# Patient Record
Sex: Female | Born: 1945 | Race: White | Hispanic: No | Smoking: Former smoker
Health system: Southern US, Community
[De-identification: ages and names within clinical notes are randomized; demographics above are authoritative.]

## PROBLEM LIST (undated history)

## (undated) DIAGNOSIS — K922 Gastrointestinal hemorrhage, unspecified: Secondary | ICD-10-CM

## (undated) DIAGNOSIS — Z923 Personal history of irradiation: Secondary | ICD-10-CM

## (undated) DIAGNOSIS — C182 Malignant neoplasm of ascending colon: Secondary | ICD-10-CM

## (undated) DIAGNOSIS — I10 Essential (primary) hypertension: Secondary | ICD-10-CM

## (undated) DIAGNOSIS — K222 Esophageal obstruction: Secondary | ICD-10-CM

## (undated) DIAGNOSIS — M810 Age-related osteoporosis without current pathological fracture: Secondary | ICD-10-CM

## (undated) DIAGNOSIS — Z9889 Other specified postprocedural states: Secondary | ICD-10-CM

## (undated) DIAGNOSIS — J3081 Allergic rhinitis due to animal (cat) (dog) hair and dander: Secondary | ICD-10-CM

## (undated) DIAGNOSIS — R9439 Abnormal result of other cardiovascular function study: Secondary | ICD-10-CM

## (undated) DIAGNOSIS — I480 Paroxysmal atrial fibrillation: Secondary | ICD-10-CM

## (undated) DIAGNOSIS — J449 Chronic obstructive pulmonary disease, unspecified: Secondary | ICD-10-CM

## (undated) DIAGNOSIS — R0600 Dyspnea, unspecified: Secondary | ICD-10-CM

## (undated) DIAGNOSIS — I2699 Other pulmonary embolism without acute cor pulmonale: Secondary | ICD-10-CM

## (undated) DIAGNOSIS — M069 Rheumatoid arthritis, unspecified: Secondary | ICD-10-CM

## (undated) HISTORY — DX: Esophageal obstruction: K22.2

## (undated) HISTORY — DX: Essential (primary) hypertension: I10

## (undated) HISTORY — DX: Malignant neoplasm of ascending colon: C18.2

## (undated) HISTORY — PX: CYSTECTOMY: SUR359

## (undated) HISTORY — DX: Abnormal result of other cardiovascular function study: R94.39

## (undated) HISTORY — DX: Age-related osteoporosis without current pathological fracture: M81.0

## (undated) HISTORY — DX: Gastrointestinal hemorrhage, unspecified: K92.2

## (undated) HISTORY — DX: Rheumatoid arthritis, unspecified: M06.9

## (undated) HISTORY — DX: Other specified postprocedural states: Z98.890

## (undated) HISTORY — DX: Other pulmonary embolism without acute cor pulmonale: I26.99

## (undated) HISTORY — DX: Allergic rhinitis due to animal (cat) (dog) hair and dander: J30.81

## (undated) NOTE — *Deleted (*Deleted)
Synopsis: Referred in September 2020 for establish care with new pulmonologist former patient of Dr. Kendrick Fries, PCP: [name], MD  Subjective:   PATIENT ID: Desiree [name] GENDER: female DOB: [birth year], MRN: [id number]  No chief complaint on file.   18 year old female past medical history of COPD, pulmonary embolism, rheumatoid arthritis, 5 mg prednisone plus Plaquenil, former patient of Dr. Kendrick Fries.  COPD currently managed with Trelegy.  Has had several images following lung nodules since 2017.  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  PET scan July 2019 showed FDG activity SUV 1.348 mm left lower lobe nodule.  IgE 158, Rast panel positive for cat dander, dog dander and Aspergillus fumigate [place].  Last evaluation included sputum cultures, fungus AFB.  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  Appears patient had exacerbation in February and was treated with Levaquin.  As for her RA she is currently on Plaquenil.  Overall she has seen a significant decline in her physical ability.  She states that she is really noticed a significant increase in her dyspnea over the past several months.  Otherwise her respiratory complaints related to her COPD have been stable.  She uses her Trelegy inhaler regularly.  She occasionally describes a central "abnormal feeling in her chest with exertion but most of the time this all dissipates with resting.  She states even the time yes tasks make her severely fatigued as come paired to previous.  Patient denies fevers chills weight loss hemoptysis.  OV 11/08/2018: (Via telephone visit).  Patient scheduled for telephone visit to review CT imaging.  Patient found to have a 2 x 1.3 cm nodule abutting the minor fissure discussed via telephone today.  OV 11/23/2018: Here today to review CT imaging in person.  Patient had pulmonary function test  completed today prior to today's office visit.  PFTs revealed a ratio of 62, FEV1 51% predicted..  Also to review her CT imaging.  Patient very anxious about all of this that is going on.  She recently changed from Trelegy to Spiriva plus Symbicort.  She is doing well with her new inhaler regimen.  She feels less dyspneic.  Patient denies fevers chills night sweats weight loss, cough or sputum production.  OV 01/20/2019:  Virtual Visit via Telephone Note  I connected withBrenda Daisy [name] on 01/20/19 at  2:15 PM EST by telephoneand verified that I am speaking with the correct person using two identifiers.  Location: Patient: Desiree [name] Provider: Josephine Igo, DO   I discussed the limitations, risks, security and privacy concerns of performing an evaluation and management service by telephone and the availability of in person appointments. I also discussed with the patient that there may be a patient responsible charge related to this service. The patient expressed understanding and agreed to proceed.   History of Present Illness: Patient has complaints today of progressive dyspnea.  She currently has significant dyspnea with working around the house trying to get dressed or even go to the grocery store.  She states she has to stop 2 or 3 times lay down and rest because her dyspnea has increased.  She has been using her inhalers daily like she is supposed to.  She has not been using her albuterol.  She only uses that at nighttime before she goes to bed when she wheezes on occasion.  She does have a nebulizer at home but also  has not been using this.  She recently had a repeat CAT scan for follow-up of lung nodule.  This right lung nodule has resolved.  She does have persistent findings consistent with MAI including tree-in-bud opacities and chronic bronchitis type changes.  She feels like she may need oxygen.  She lost her SPO2 monitor at home and has not been able to monitor her oxygen  needs.  Observations/Objective: Able to speak in complete sentences.  Nonlabored breathing  CT scan of the chest: Right lung nodule resolution, tree-in-bud opacities and evidence of what would be considered chronic infection such as MAI. The patient's images have been independently reviewed by me.    Left heart catheterization: No severe obstruction.  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 Plan to walk in the office tomorrow to see if she qualifies for O2 need. Continue current inhaler regimen. Encourage patient to use nebulizer when she has acute episodes of shortness of breath at home or needs this prior to any significant exertional event. Patient was counseled on this. As for the possible MAI on CT I have ordered respiratory cultures of her sputum to include aerobic, anaerobic, fungus and AFB. Continue on triple therapy inhaler regimen.  Plus as needed albuterol Next step for management of COPD dyspnea symptoms would include items such as Daliresp or azithromycin Monday Wednesday Friday.  I would prefer to prove that she does not have MAI prior to starting azithromycin we may not be able to do this with sputum production alone. May consider Daliresp however we will need to counsel on risk of diarrhea.  Follow Up Instructions: Follow-up in clinic tomorrow for nurses walk to assess oxygen need  Telemetry visit with TP in 1 week  I discussed the assessment and treatment plan with the patient. The patient was provided an opportunity to ask questions and all were answered. The patient agreed with the plan and demonstrated an understanding of the instructions.  The patient was advised to call back or seek an in-person evaluation if the symptoms worsen or if the condition fails  to improve as anticipated.  I provided 22 minutes of non-face-to-face time during this encounter.   Josephine Igo, DO      Past Medical History:  Diagnosis Date  . Cat allergies   . COPD (chronic obstructive pulmonary disease) (HCC)   . Hypertension   . Osteoporosis   . Pulmonary embolism (HCC) 2017  . Rheumatoid arthritis(714.0)    Dr Azzie Roup follows     Family History  Problem Relation Age of Onset  . Heart disease Father   . Stroke Mother   . Clotting disorder Mother   . Heart disease Sister   . Sleep apnea Sister   . Emphysema Sister   . Breast cancer Maternal Aunt   . Emphysema Paternal Grandmother      Past Surgical History:  Procedure Laterality Date  . CYSTECTOMY Right    breast  . LEFT HEART CATH AND CORONARY ANGIOGRAPHY N/A 01/05/2019   Procedure: LEFT HEART CATH AND CORONARY ANGIOGRAPHY;  Surgeon: Corky Crafts, MD;  Location: [facility] INVASIVE CV LAB;  Service: Cardiovascular;  Laterality: N/A;    Social History   Socioeconomic History  . Marital status: Widowed    Spouse name: Not on file  . Number of children: Not  on file  . Years of education: Not on file  . Highest education level: Not on file  Occupational History  . Occupation: retired  Tobacco Use  . Smoking status: Former Smoker    Packs/day: 1.00    Years: 40.00    Pack years: 40.00    Types: Cigarettes    Quit date: 02/03/2001    Years since quitting: 18.8  . Smokeless tobacco: Never Used  Vaping Use  . Vaping Use: Never used  Substance and Sexual Activity  . Alcohol use: Yes    Alcohol/week: 2.0 standard drinks    Types: 2 Standard drinks or equivalent per week    Comment: 2 drinks daily  . Drug use: No  . Sexual activity: Not on file  Other Topics Concern  . Not on file  Social History Narrative   Tobacco use, amount per day now: NONE   Past tobacco use, amount per day: 1 PACK PER DAY   How many years did you use tobacco:TOO MANY STOPPED IN 2003   Alcohol use  (drinks per week): 14   Diet: GOOD   Do you drink/eat things with caffeine: YES   Marital status: WIDOW                What year were you married? 1967   Do you live in a house, apartment, assisted living, condo, trailer, etc.? HOUSE   Is it one or more stories? SPLIT LEVEL   How many persons live in your home? 1   Do you have pets in your home?( please list) NONE   Current or past profession: PAST RECORDS TECHNICIAN AT [employer]   Do you exercise?  NO                                Type and how often?   Do you have a living will? YES    Do you have a DNR form?  YES                                 If not, do you want to discuss one?   Do you have signed POA/HPOA forms?        ??              If so, please bring to you appointment      [facility] (05/16/16):   Originally from [place], [place]. 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 No pets currently. No bird, mold, or hot tub exposure.    Social Determinants of Health   Financial Resource Strain:   . Difficulty of Paying Living Expenses: Not on file  Food Insecurity:   . Worried About Programme researcher, broadcasting/film/video in the Last Year: Not on file  . Ran Out of Food in the Last Year: Not on file  Transportation Needs:   . Lack of Transportation (Medical): Not on file  . Lack of Transportation (Non-Medical): Not on file  Physical Activity:   . Days of Exercise per Week: Not on file  . Minutes of Exercise per Session: Not on file  Stress:   . Feeling of Stress : Not on file  Social Connections:   . Frequency of Communication with Friends and Family: Not on file  . Frequency of Social Gatherings with Friends and Family: Not on file  .  Attends Religious Services: Not on file  . Active Member of Clubs or Organizations: Not on file  . Attends Banker Meetings: Not on file  . Marital Status: Not on file  Intimate Partner Violence:   . Fear of Current or Ex-Partner: Not on file  .  Emotionally Abused: Not on file  . Physically Abused: Not on file  . Sexually Abused: Not on file     Allergies  Allergen Reactions  . Other Shortness Of Breath    Cats     Outpatient Medications Prior to Visit  Medication Sig Dispense Refill  . albuterol (PROAIR HFA) 108 (90 Base) MCG/ACT inhaler Inhale 2 puffs into the lungs every 4 (four) hours as needed for wheezing or shortness of breath.    Marland Kitchen amLODipine (NORVASC) 5 MG tablet Take 1 tablet (5 mg total) by mouth daily. 90 tablet 1  . apixaban (ELIQUIS) 2.5 MG TABS tablet Take 1 tablet (2.5 mg total) by mouth 2 (two) times daily. 60 tablet 2  . atorvastatin (LIPITOR) 20 MG tablet Take 1 tablet (20 mg total) by mouth daily. 90 tablet 3  . budesonide-formoterol (SYMBICORT) 160-4.5 MCG/ACT inhaler Inhale 2 puffs into the lungs every 12 (twelve) hours. 10.2 g 11  . carvedilol (COREG) 3.125 MG tablet Take 1 tablet (3.125 mg total) by mouth 2 (two) times daily with a meal. 180 tablet 2  . cholecalciferol (VITAMIN D) 25 MCG (1000 UT) tablet Take 1,000 Units by mouth daily.     . ferrous sulfate 325 (65 FE) MG EC tablet Take 325 mg by mouth in the morning and at bedtime.    . fexofenadine (ALLEGRA) 180 MG tablet Take 180 mg by mouth daily.    . hydroxychloroquine (PLAQUENIL) 200 MG tablet Take 200 mg by mouth 2 (two) times daily.   3  . leflunomide (ARAVA) 20 MG tablet Take 20 mg by mouth daily.    . montelukast (SINGULAIR) 10 MG tablet Take 1 tablet (10 mg total) by mouth at bedtime. 90 tablet 1  . omeprazole (PRILOSEC) 20 MG capsule Take 1 capsule (20 mg total) by mouth daily. 90 capsule 1  . predniSONE (DELTASONE) 5 MG tablet Take 5 mg by mouth daily with breakfast.    . sertraline (ZOLOFT) 100 MG tablet Take 100 mg by mouth daily.     . Tiotropium Bromide Monohydrate (SPIRIVA RESPIMAT) 2.5 MCG/ACT AERS Inhale 2 puffs into the lungs daily. 4 g 11  . valsartan (DIOVAN) 320 MG tablet Take 320 mg by mouth daily.    . vitamin B-12  (CYANOCOBALAMIN) 1000 MCG tablet Take 1,000 mcg by mouth daily.      No facility-administered medications prior to visit.    ROS   Objective:  Physical Exam   There were no vitals filed for this visit.   on RA BMI Readings from Last 3 Encounters:  05/25/19 25.51 kg/m  02/02/19 27.02 kg/m  01/05/19 27.21 kg/m   Wt Readings from Last 3 Encounters:  05/25/19 135 lb (61.2 kg)  02/02/19 143 lb (64.9 kg)  01/05/19 144 lb (65.3 kg)     CBC    Component Value Date/Time   WBC 8.5 09/30/2019 1151   WBC 3.4 (L) 11/24/2014 0029   RBC 3.36 (L) 09/30/2019 1151   RBC 3.09 (L) 11/24/2014 0029   HGB 11.1 09/30/2019 1151   HCT 33.4 (L) 09/30/2019 1151   PLT 333 09/30/2019 1151   MCV 99 (H) 09/30/2019 1151   MCH 33.0 09/30/2019  1151   MCH 32.7 11/24/2014 0029   MCHC 33.2 09/30/2019 1151   MCHC 33.9 11/24/2014 0029   RDW 12.3 09/30/2019 1151   LYMPHSABS 0.6 (L) 10/03/2015 1512   MONOABS 1.2 (H) 11/24/2014 0029   EOSABS 0.2 10/03/2015 1512   BASOSABS 0.0 10/03/2015 1512    Chest Imaging: CT chest 03/03/2017: Evidence of infectious bronchiolitis, 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  Moderate emphysema. The patient's images have been independently reviewed by me.    CT chest September 2020: Reviewed with patient today in the office.  Does have 2 cm right upper lobe nodule with area of inflammatory appearing groundglass around this. Also has evidence of MAI and cylindric bronchiectasis. The patient's images have been independently reviewed by me.    Pulmonary Functions Testing Results: PFT Results Latest Ref Rng & Units 11/23/2018 09/05/2016  FVC-Pre L 1.46 1.71  FVC-Predicted Pre % 59 67  FVC-Post L 1.52 1.75  FVC-Predicted Post % 61 69  Pre FEV1/FVC % % 63 66  Post FEV1/FCV % % 62 69  FEV1-Pre L 0.91 1.13  FEV1-Predicted Pre % 49 59  FEV1-Post L 0.94 1.21  DLCO uncorrected ml/min/mmHg 9.72 13.43  DLCO UNC% %  56 66  DLCO corrected ml/min/mmHg - 14.03  DLCO COR %Predicted % - 69  DLVA Predicted % 72 86  TLC L 4.17 4.77  TLC % Predicted % 90 103  RV % Predicted % 113 128    FeNO: None   Pathology: None  Echocardiogram: None   Heart Catheterization:  01/05/2019:  Mid LAD lesion is 10% stenosed.  The left ventricular systolic function is normal.  LV end diastolic pressure is normal.  The left ventricular ejection fraction is 55-65% by visual estimate.  There is no aortic valve stenosis.    Assessment & Plan:   No diagnosis found.   Josephine Igo, DO [facility] 12/09/2019 2:50 PM

---

## 2003-08-03 ENCOUNTER — Other Ambulatory Visit: Admission: RE | Admit: 2003-08-03 | Discharge: 2003-08-03 | Payer: Self-pay | Admitting: Obstetrics and Gynecology

## 2005-04-28 ENCOUNTER — Ambulatory Visit: Payer: Self-pay | Admitting: Internal Medicine

## 2005-06-26 ENCOUNTER — Ambulatory Visit: Payer: Self-pay | Admitting: Internal Medicine

## 2010-02-24 ENCOUNTER — Encounter: Payer: Self-pay | Admitting: Obstetrics and Gynecology

## 2010-08-21 ENCOUNTER — Encounter: Payer: Self-pay | Admitting: Internal Medicine

## 2010-08-22 ENCOUNTER — Encounter: Payer: Self-pay | Admitting: Internal Medicine

## 2010-08-22 ENCOUNTER — Ambulatory Visit: Payer: Self-pay | Admitting: Internal Medicine

## 2010-08-22 ENCOUNTER — Ambulatory Visit (INDEPENDENT_AMBULATORY_CARE_PROVIDER_SITE_OTHER)
Admission: RE | Admit: 2010-08-22 | Discharge: 2010-08-22 | Disposition: A | Discharge status: Home / Self Care | Payer: Medicare Other | Source: Ambulatory Visit | Attending: Internal Medicine | Admitting: Internal Medicine

## 2010-08-22 ENCOUNTER — Telehealth: Payer: Self-pay | Admitting: Internal Medicine

## 2010-08-22 ENCOUNTER — Ambulatory Visit (INDEPENDENT_AMBULATORY_CARE_PROVIDER_SITE_OTHER): Payer: Medicare Other | Admitting: Internal Medicine

## 2010-08-22 VITALS — BP 120/72 | HR 87 | Temp 97.5°F | Ht 63.0 in | Wt 129.2 lb

## 2010-08-22 DIAGNOSIS — I10 Essential (primary) hypertension: Secondary | ICD-10-CM

## 2010-08-22 DIAGNOSIS — J449 Chronic obstructive pulmonary disease, unspecified: Secondary | ICD-10-CM

## 2010-08-22 HISTORY — DX: Essential (primary) hypertension: I10

## 2010-08-22 NOTE — Assessment & Plan Note (Signed)

## 2010-08-22 NOTE — Progress Notes (Signed)
Subjective:     Patient ID: Desiree [name], female   DOB: [birth year], 65 y.o.   MRN: [id number]  HPI  58 yowf stopped smoking  in 2004 with PFT's 06/2005 with FEV 70% and problems with intermittent flares of bronchitis since and dx with RA by Dr Dareen Piano 2011 and "bronchitis keeps getting worse" so self referred back to pulmonary clinic 08/2010    08/22/2010 Initial pulmonary office eval in EMR era on ACE  with breathing better since last required prednisone and fm concerned about repeated use.  No purulent sputum at ov nor limiting sob or need for saba.  Sleeping ok without nocturnal  or early am exac of resp c/o's  .    Pt denies any significant sore throat, dysphagia, itching, sneezing,  nasal congestion or excess/ purulent secretions,  fever, chills, sweats, unintended wt loss, pleuritic or exertional cp, hempoptysis, orthopnea pnd or leg swelling.    Also denies any obvious fluctuation of symptoms with weather or environmental changes or other aggravating or alleviating factors.  Seems to get bronchitis every time she travels out of state     Review of Systems  Constitutional: Negative for fever, chills and unexpected weight change.  HENT: Negative for ear pain, nosebleeds, congestion, sore throat, rhinorrhea, sneezing, trouble swallowing, dental problem, voice change, postnasal drip and sinus pressure.   Eyes: Negative for visual disturbance.  Respiratory: Negative for cough, choking and shortness of breath.   Cardiovascular: Negative for chest pain and leg swelling.  Gastrointestinal: Negative for vomiting, abdominal pain and diarrhea.  Genitourinary: Negative for difficulty urinating.  Musculoskeletal: Negative for arthralgias.  Skin: Negative for rash.  Neurological: Negative for tremors, syncope and headaches.  Hematological: Does not bruise/bleed easily.       Objective:   Physical Exam    pleasant amb wf nad. Wt 129 08/22/2010  HEENT mild turbinate edema.  Oropharynx no  thrush or excess pnd or cobblestoning.  No JVD or cervical adenopathy. Mild accessory muscle hypertrophy. Trachea midline, nl thryroid. Chest was hyperinflated by percussion with diminished breath sounds and moderate increased exp time without wheeze. Hoover sign positive at mid inspiration. Regular rate and rhythm without murmur gallop or rub or increase P2 or edema.  Abd: no hsm, nl excursion. Ext warm without cyanosis or clubbing.    cxr 08/22/2010 No acute cardiopulmonary disease.   Assessment:         Plan:

## 2010-08-22 NOTE — Patient Instructions (Signed)
Bring all active medications with you  Stop lotrel 5/20 and take Azor 5/20 one daily in its place  Please schedule a follow up office visit in 4 weeks, sooner if needed for PFT's

## 2010-08-22 NOTE — Telephone Encounter (Signed)
Pt aware this has been taken care of

## 2010-08-22 NOTE — Assessment & Plan Note (Addendum)
Symptoms are markedly disproportionate to objective findings and not clear this is a lung problem but pt does appear to have difficult airway management issues.  DDX of  difficult airways managment all start with A and  include Adherence, Ace Inhibitors, Acid Reflux, Active Sinus Disease, Alpha 1 Antitripsin deficiency, Anxiety masquerading as Airways dz,  ABPA,  allergy(esp in young), Aspiration (esp in elderly), Adverse effects of DPI,  Active smokers, plus two Bs  = Bronchiectasis and Beta blocker use..and one C= CHF  ACE inhibitors the leading suspect here and needs trial off then return for pfts  Adherence also a concern, confused with meds  See instructions for specific recommendations which were reviewed directly with the patient who was given a copy with highlighter outlining the key components.

## 2010-08-22 NOTE — Telephone Encounter (Signed)
[email or web address]  

## 2010-08-23 ENCOUNTER — Telehealth: Payer: Self-pay | Admitting: Internal Medicine

## 2010-08-23 NOTE — Telephone Encounter (Signed)
Pt returning call can be reached at [phone].Desiree [name]

## 2010-08-23 NOTE — Telephone Encounter (Signed)
Spoke with the pt and she states she called her pharmacy and the Azor will be $62 per month and she wanted to know if there was a cheaper alternative. She was given samples at appt yesterday of the Azor with instruction to f/u in 4 weeks. Pt states she has enough samples to last until OV so I advised her to use the samples and bring a copy of her formulary with her to her appt so MW can decide on alternative. Carron Curie, CMA

## 2010-08-23 NOTE — Telephone Encounter (Signed)
[email or web address].Rennie Rouch, CMA  

## 2010-09-19 ENCOUNTER — Ambulatory Visit (INDEPENDENT_AMBULATORY_CARE_PROVIDER_SITE_OTHER): Payer: Medicare Other | Admitting: Internal Medicine

## 2010-09-19 ENCOUNTER — Encounter: Payer: Self-pay | Admitting: Internal Medicine

## 2010-09-19 VITALS — BP 118/70 | HR 90 | Temp 97.8°F | Ht 63.0 in | Wt 128.0 lb

## 2010-09-19 DIAGNOSIS — J449 Chronic obstructive pulmonary disease, unspecified: Secondary | ICD-10-CM

## 2010-09-19 DIAGNOSIS — M069 Rheumatoid arthritis, unspecified: Secondary | ICD-10-CM

## 2010-09-19 DIAGNOSIS — I10 Essential (primary) hypertension: Secondary | ICD-10-CM

## 2010-09-19 LAB — PULMONARY FUNCTION TEST

## 2010-09-19 NOTE — Assessment & Plan Note (Addendum)
Mild copd GOLD II.    As I explained to this patient in detail:  although there may be significant copd present, it does not appear to be limiting activity tolerance any more than a set of worn tires limits someone from driving a car  around a parking lot.  A new set of Michelins might look good but would have no perceived impact on the performance of the car and would not be worth the cost.  That is to say:   this pt is so sedentary I don't recommend aggressive pulmonary rx at this point unless limiting symptoms arise or acute exacerbations become as issue, neither of which are the case now.  I asked the patient to contact this office at any time in the future should either of these problems arise.

## 2010-09-19 NOTE — Progress Notes (Signed)
Subjective:     Patient ID: Desiree [name], female   DOB: Jan 31, 1946, 65 y.o.   MRN: [id number]  HPI  22 yowf stopped smoking  in 2004 with PFT's 06/2005 with FEV 70% and problems with intermittent flares of bronchitis since and dx with RA by Dr Dareen Piano 2011 and "bronchitis keeps getting worse" so self referred back to pulmonary clinic 08/2010    08/22/2010 Initial pulmonary office eval in EMR era on ACEI  with breathing better since last required prednisone and fm concerned about repeated use.  No purulent sputum at ov nor limiting sob or need for saba.   09/19/2010 f/u ov/Desiree [name] cc doe x yeardwork when humid x 20 min s stopping,  Does mall walking fine,  Does treadmill x 30 min variable inclines up to 3 mph.  No episodes of "bronchitis" since stopped acei but hasn't traveled since then, her usual trigger.  Sleeping ok without nocturnal  or early am exac of resp c/o's.   Pt denies any significant sore throat, dysphagia, itching, sneezing,  nasal congestion or excess/ purulent secretions,  fever, chills, sweats, unintended wt loss, pleuritic or exertional cp, hempoptysis, orthopnea pnd or leg swelling.    Also denies any obvious fluctuation of symptoms with weather or environmental changes or other aggravating or alleviating factors.              Objective:   Physical Exam    pleasant amb wf nad. Wt 129 08/22/2010 >  128 09/19/2010  HEENT mild turbinate edema.  Oropharynx no thrush or excess pnd or cobblestoning.  No JVD or cervical adenopathy. Mild accessory muscle hypertrophy. Trachea midline, nl thryroid. Chest was hyperinflated by percussion with diminished breath sounds and moderate increased exp time without wheeze. Hoover sign positive at mid inspiration. Regular rate and rhythm without murmur gallop or rub or increase P2 or edema.  Abd: no hsm, nl excursion. Ext warm without cyanosis or clubbing.    cxr 08/22/2010 No acute cardiopulmonary disease.   Assessment:         Plan:

## 2010-09-19 NOTE — Patient Instructions (Signed)
Continue azor 5/20 one daily and bring the sheet with blood pressure medication list with you to next visit so we can pick you a good generic.  You have only have mild copd from smoking.   Please schedule a follow up office visit in 6 weeks, call sooner if needed

## 2010-09-19 NOTE — Progress Notes (Signed)
PFT done today. 

## 2010-09-20 DIAGNOSIS — M069 Rheumatoid arthritis, unspecified: Secondary | ICD-10-CM | POA: Insufficient documentation

## 2010-09-20 HISTORY — DX: Rheumatoid arthritis, unspecified: M06.9

## 2010-09-20 NOTE — Assessment & Plan Note (Signed)
Better off acei so far but hasn't traveled.  Prefer to leave them off for at least a year to establish a baseline frequency and severity of "bronchtitis" then if felt to be advantageous over ARB in term of cost/benefit/risk ok with me to rechallenge

## 2010-09-20 NOTE — Assessment & Plan Note (Signed)
Reviewed pft's and cxr: no evidence of ra lung though she is at risk of this plus adverse effects of ra drugs like mtx

## 2010-11-04 ENCOUNTER — Encounter: Payer: Self-pay | Admitting: Internal Medicine

## 2010-11-04 ENCOUNTER — Ambulatory Visit (INDEPENDENT_AMBULATORY_CARE_PROVIDER_SITE_OTHER): Payer: Medicare Other | Admitting: Internal Medicine

## 2010-11-04 DIAGNOSIS — J4489 Other specified chronic obstructive pulmonary disease: Secondary | ICD-10-CM

## 2010-11-04 DIAGNOSIS — I10 Essential (primary) hypertension: Secondary | ICD-10-CM

## 2010-11-04 DIAGNOSIS — J449 Chronic obstructive pulmonary disease, unspecified: Secondary | ICD-10-CM

## 2010-11-04 MED ORDER — AMLODIPINE BESYLATE 5 MG PO TABS: 5.0000 mg | ORAL_TABLET | Freq: Every day | ORAL | Status: DC

## 2010-11-04 MED ORDER — IRBESARTAN 300 MG PO TABS: ORAL_TABLET | ORAL | Status: DC

## 2010-11-04 NOTE — Progress Notes (Signed)
Subjective:     Patient ID: Desiree [name], female   DOB: Mar 10, 1945, 65 y.o.   MRN: [id number]  HPI  24 yowf stopped smoking  in 2004 with PFT's 06/2005 with FEV 70% and problems with intermittent flares of bronchitis since and dx with RA by Dr Dareen Piano 2011 and "bronchitis keeps getting worse" so self referred back to pulmonary clinic 08/2010    08/22/2010 Initial pulmonary office eval in EMR era on ACEI  with breathing better since last required prednisone and fm concerned about repeated use.  No purulent sputum at ov nor limiting sob or need for saba. rec stop ace then regroup   09/19/2010 f/u ov/Desiree [name] cc doe x yardwork when humid x 20 min s stopping,  Does mall walking fine,  Does treadmill x 30 min variable inclines up to 3 mph.  No episodes of "bronchitis" since stopped acei but hasn't traveled since then, her usual trigger. rec Continue azor 5/20 one daily and bring the sheet with blood pressure medication list with you to next visit so we can pick you a good generic.  You have only have mild copd from smoking.     11/04/2010 f/u ov/Desiree [name] cc meds too expensive, no change in ex tol, no cough or aecopd but drainage each am takes about an hour to hock up the mucus happens year round < 1/4 of a cup of clear mucus and concerned re upcoming trips to [place] and [place] (in [place] anticipated).  No change in am cough on or off prednisone for RA  Sleeping ok without nocturnal  or early am exac of resp c/o's.   Pt denies any significant sore throat, dysphagia, itching, sneezing,  nasal congestion or excess/ purulent secretions,  fever, chills, sweats, unintended wt loss, pleuritic or exertional cp, hempoptysis, orthopnea pnd or leg swelling.    Also denies any obvious fluctuation of symptoms with weather or environmental changes or other aggravating or alleviating factors.              Objective:   Physical Exam    pleasant amb wf nad. Wt 129 08/22/2010 >  128 09/19/2010 > 128 11/04/2010    HEENT mild turbinate edema.  Oropharynx no thrush or excess pnd or cobblestoning.  No JVD or cervical adenopathy. Mild accessory muscle hypertrophy. Trachea midline, nl thryroid. Chest was hyperinflated by percussion with diminished breath sounds and moderate increased exp time without wheeze. Hoover sign positive at mid inspiration. Regular rate and rhythm without murmur gallop or rub or increase P2 or edema.  Abd: no hsm, nl excursion. Ext warm without cyanosis or clubbing.    cxr 08/22/2010 No acute cardiopulmonary disease.   Assessment:         Plan:

## 2010-11-04 NOTE — Patient Instructions (Addendum)
Stop azor (which is a combination pill) and take it's components, amlodipine 5 mg and Irbesartan 300mg  each am   Stop fish oil  Start Pepcid 20 mg  Ac one at bedtime  Start singulair a week before allergy exposure and then just use clariton if  Nasal allergy symptoms. If breathing symptoms use albuterol and avoid cat exposure  GERD (REFLUX)  is an extremely common cause of respiratory symptoms, many times with no significant heartburn at all.    It can be treated with medication, but also with lifestyle changes including avoidance of late meals, excessive alcohol, smoking cessation, and avoid fatty foods, chocolate, peppermint, colas, red wine, and acidic juices such as orange juice.  NO MINT OR MENTHOL PRODUCTS SO NO COUGH DROPS  USE SUGARLESS CANDY INSTEAD (jolley ranchers or Stover's)  NO OIL BASED VITAMINS - use powdered substitutes.   Please schedule a follow up visit in 3 months but call sooner if needed

## 2010-11-05 ENCOUNTER — Encounter: Payer: Self-pay | Admitting: Internal Medicine

## 2010-11-05 NOTE — Assessment & Plan Note (Signed)
Adequate control on present rx, reviewed   Main issue is what to do if flares, which is much less likely off acei and with continued gerd rx I had an extended discussion with the patient today lasting 15 to 20 minutes of a 25 minute visit on the following issues: See instructions for specific recommendations which were reviewed directly with the patient who was given a copy with highlighter outlining the key components.

## 2010-11-05 NOTE — Assessment & Plan Note (Signed)
Adequate control on present rx, reviewed off ACEI successfully and would definitely avoid in future due to difficulty in sorting out symptoms of "aecopd" from ace effect  See instructions for specific recommendations which were reviewed directly with the patient who was given a copy with highlighter outlining the key components. (changed to generics per her formulary)

## 2010-12-20 ENCOUNTER — Telehealth: Payer: Self-pay | Admitting: Internal Medicine

## 2010-12-20 NOTE — Telephone Encounter (Signed)
I spoke with pt and she states she wanted to come in and see MW next mon, tues bc she is flying to [place] on wed. I advised pt MW did not have any openings but could see TP. Pt is scheduled to see TP Monday at 3:15

## 2010-12-23 ENCOUNTER — Encounter: Payer: Self-pay | Admitting: Adult Health

## 2010-12-23 ENCOUNTER — Ambulatory Visit (INDEPENDENT_AMBULATORY_CARE_PROVIDER_SITE_OTHER): Payer: Medicare Other | Admitting: Adult Health

## 2010-12-23 DIAGNOSIS — J449 Chronic obstructive pulmonary disease, unspecified: Secondary | ICD-10-CM

## 2010-12-23 MED ORDER — PHENYLEPH-PROMETHAZINE-COD 5-6.25-10 MG/5ML PO SYRP: 5.0000 mL | ORAL_SOLUTION | Freq: Four times a day (QID) | ORAL | Status: DC | PRN

## 2010-12-23 MED ORDER — AZITHROMYCIN 250 MG PO TABS: ORAL_TABLET | ORAL | Status: AC

## 2010-12-23 NOTE — Patient Instructions (Addendum)
Zpack take as directed.  Mucinex DM Twice daily  As needed  Cough /congestion  Phenergan with codeine As needed  For cough -may make you sleepy  Please contact office for sooner follow up if symptoms do not improve or worsen or seek emergency care  Fluids and rest .  follow up Dr. Sherene Sires  As planned and As needed

## 2010-12-23 NOTE — Assessment & Plan Note (Signed)
Mild flare with bronchitis   Plan ' Zpack take as directed.  Mucinex DM Twice daily  As needed  Cough /congestion  Phenergan with codeine As needed  For cough -may make you sleepy  Please contact office for sooner follow up if symptoms do not improve or worsen or seek emergency care  Fluids and rest .  follow up Dr. Sherene Sires  As planned and As needed

## 2010-12-23 NOTE — Progress Notes (Signed)
Subjective:     Patient ID: Desiree [name], female   DOB: 1945/11/26, 65 y.o.   MRN: [id number]  HPI 13 yowf stopped smoking  in 2004 with PFT's 06/2005 with FEV 70% and problems with intermittent flares of bronchitis since and dx with RA by Dr Dareen Piano 2011 and "bronchitis keeps getting worse" so self referred back to pulmonary clinic 08/2010    08/22/2010 Initial pulmonary office eval in EMR era on ACEI  with breathing better since last required prednisone and fm concerned about repeated use.  No purulent sputum at ov nor limiting sob or need for saba. rec stop ace then regroup   09/19/2010 f/u ov/Wert cc doe x yardwork when humid x 20 min s stopping,  Does mall walking fine,  Does treadmill x 30 min variable inclines up to 3 mph.  No episodes of "bronchitis" since stopped acei but hasn't traveled since then, her usual trigger. rec Continue azor 5/20 one daily and bring the sheet with blood pressure medication list with you to next visit so we can pick you a good generic.  You have only have mild copd from smoking.     11/04/2010 f/u ov/Wert cc meds too expensive, no change in ex tol, no cough or aecopd but drainage each am takes about an hour to hock up the mucus happens year round < 1/4 of a cup of clear mucus and concerned re upcoming trips to [place] and [place] (in [place] anticipated).  No change in am cough on or off prednisone for RA >>add pepcid and singulair   12/23/2010 Acute OV  Complains of Patient states she may have bronchitis. Patient c/o cough with cloudy mucus, fatigue, sob, and unable to sleep due to cough. Onset x1 week ago. She is going out of town for the holidays and does not want to be sick. Wants a zpack to get over symptoms.  Denies any chest pain or dyspnea /edema   ROS:  Constitutional:   No  weight loss, night sweats,  Fevers, chills, ++ fatigue, or  lassitude.  HEENT:   No headaches,  Difficulty swallowing,  Tooth/dental problems, or  Sore throat,     No sneezing, itching, ear ache,  ++nasal congestion, post nasal drip,   CV:  No chest pain,  Orthopnea, PND, swelling in lower extremities, anasarca, dizziness, palpitations, syncope.   GI  No heartburn, indigestion, abdominal pain, nausea, vomiting, diarrhea, change in bowel habits, loss of appetite, bloody stools.   Resp:    No coughing up of blood.    No chest wall deformity  Skin: no rash or lesions.  GU: no dysuria, change in color of urine, no urgency or frequency.  No flank pain, no hematuria   MS:  No joint pain or swelling.  No decreased range of motion.  No back pain.  Psych:  No change in mood or affect. No depression or anxiety.  No memory loss.              Objective:   Physical Exam    pleasant amb wf nad. Wt 129 08/22/2010 >  128 09/19/2010 > 128 11/04/2010 >>130 12/23/2010  HEENT mild turbinate edema.  Oropharynx no thrush or excess pnd or cobblestoning.  No JVD or cervical adenopathy. Mild accessory muscle hypertrophy. Trachea midline, nl thryroid.Chest : coarse BS  Regular rate and rhythm without murmur gallop or rub or increase P2 or edema.  Abd: no hsm, nl excursion. Ext warm without cyanosis or clubbing.  Assessment:         Plan:

## 2011-02-17 ENCOUNTER — Encounter: Payer: Self-pay | Admitting: Emergency Medicine

## 2011-02-17 ENCOUNTER — Ambulatory Visit (INDEPENDENT_AMBULATORY_CARE_PROVIDER_SITE_OTHER): Payer: Medicare Other | Admitting: Emergency Medicine

## 2011-02-17 ENCOUNTER — Telehealth: Payer: Self-pay | Admitting: Internal Medicine

## 2011-02-17 ENCOUNTER — Ambulatory Visit (INDEPENDENT_AMBULATORY_CARE_PROVIDER_SITE_OTHER)
Admission: RE | Admit: 2011-02-17 | Discharge: 2011-02-17 | Disposition: A | Discharge status: Home / Self Care | Payer: Medicare Other | Source: Ambulatory Visit | Attending: Emergency Medicine | Admitting: Emergency Medicine

## 2011-02-17 VITALS — BP 102/78 | HR 92 | Temp 98.2°F | Ht 62.0 in | Wt 126.8 lb

## 2011-02-17 DIAGNOSIS — J449 Chronic obstructive pulmonary disease, unspecified: Secondary | ICD-10-CM

## 2011-02-17 DIAGNOSIS — R05 Cough: Secondary | ICD-10-CM

## 2011-02-17 DIAGNOSIS — R059 Cough, unspecified: Secondary | ICD-10-CM

## 2011-02-17 NOTE — Telephone Encounter (Signed)
[id number] x1 

## 2011-02-17 NOTE — Progress Notes (Signed)
  Subjective:    Patient ID: Desiree [name], female    DOB: Oct 08, 1945, 66 y.o.   MRN: [id number]  HPI 89 former smoker, mild COPD followed by Dr Sherene Sires (FEV1 75% pred in 2012). Tells me that she developed URI about 10d ago, evolved into progressive dyspnea. No real cough, but a lot of head congestion and chest congestion. Started on prednisone + omnicef/azithro 5 days ago. She is using albuterol every 6 hours right now, not sure if it is helping. CXR today is clear.    Review of Systems  Constitutional: Negative.  Negative for fever and unexpected weight change.  HENT: Positive for congestion. Negative for ear pain, nosebleeds, sore throat, rhinorrhea, sneezing, trouble swallowing, dental problem, postnasal drip and sinus pressure.   Eyes: Negative.  Negative for redness and itching.  Respiratory: Positive for cough, shortness of breath and wheezing. Negative for chest tightness.   Cardiovascular: Negative.  Negative for palpitations and leg swelling.  Gastrointestinal: Negative.  Negative for nausea and vomiting.  Genitourinary: Negative.  Negative for dysuria.  Musculoskeletal: Negative.  Negative for joint swelling.  Skin: Negative.  Negative for rash.  Neurological: Negative.  Negative for headaches.  Hematological: Negative.  Does not bruise/bleed easily.  Psychiatric/Behavioral: Negative.  Negative for dysphoric mood. The patient is not nervous/anxious.        Objective:   Physical Exam  Gen: Pleasant, well-nourished, in no distress,  normal affect  ENT: No lesions,  mouth clear,  oropharynx clear, no postnasal drip  Neck: No JVD, no TMG, no carotid bruits  Lungs: No use of accessory muscles, clear on normal breath, some exp wheeze on forced exp.  Cardiovascular: RRR, heart sounds normal, no murmur or gallops, no peripheral edema  Musculoskeletal: No deformities, no cyanosis or clubbing  Neuro: alert, non focal  Skin: Warm, no lesions or rashes   CXR 02/17/11: clear, no  infiltrates.     Assessment & Plan:  COPD (chronic obstructive pulmonary disease) With apparent AE following a URI. No CAP on CXR today.  - finish pred and abx as ordered by her PCP - albuterol prn - OV with MW or TP to insure resolution in 3 weeks

## 2011-02-17 NOTE — Telephone Encounter (Signed)
Patient returning call.

## 2011-02-17 NOTE — Telephone Encounter (Signed)
I spoke with pt and she states her pcp advised her that she had PNA (but did not do cxr) and needed to f/u w/ pulm ASAP w/ cxr for this. Pt is scheduled to come in and see RB today at 3:15. Per lori have pt have cxr done 1st before apt. Pt aware to arrive 15 min early for cxr. Nothing further was needed

## 2011-02-17 NOTE — Assessment & Plan Note (Signed)
With apparent AE following a URI. No CAP on CXR today.  - finish pred and abx as ordered by her PCP - albuterol prn - OV with MW or TP to insure resolution in 3 weeks

## 2011-02-17 NOTE — Patient Instructions (Signed)
Please finish the prednisone and antibiotics that you are taking as directed Use Ventolin or albuterol nebulizer as needed for shortness of breath. You could use up to every 4 hours if needed.  Follow with Dr Sherene Sires or T. Parrett in 3 weeks to insure that you are improved.  Call our office if you are worsening in any way.

## 2011-03-14 ENCOUNTER — Ambulatory Visit (INDEPENDENT_AMBULATORY_CARE_PROVIDER_SITE_OTHER): Payer: Medicare Other | Admitting: Internal Medicine

## 2011-03-14 ENCOUNTER — Encounter: Payer: Self-pay | Admitting: Internal Medicine

## 2011-03-14 VITALS — BP 122/80 | HR 107 | Temp 98.5°F | Ht 62.0 in | Wt 126.0 lb

## 2011-03-14 DIAGNOSIS — I10 Essential (primary) hypertension: Secondary | ICD-10-CM

## 2011-03-14 DIAGNOSIS — J449 Chronic obstructive pulmonary disease, unspecified: Secondary | ICD-10-CM

## 2011-03-14 NOTE — Progress Notes (Signed)
Subjective:     Patient ID: Desiree [name], female   DOB: 01-25-1946   MRN: [id number]  Brief patient profile:  17 yowf stopped smoking  in 2004 with PFT's 06/2005 with FEV 70% and problems with intermittent flares of bronchitis since and dx with RA by Desiree [name] 2011 and "bronchitis keeps getting worse" so self referred back to pulmonary clinic 08/2010     HPI 08/22/2010 Initial pulmonary office eval in EMR era on ACEI  with breathing better since last required prednisone and fm concerned about repeated use.  No purulent sputum at ov nor limiting sob or need for saba. rec stop ace then regroup   09/19/2010 f/u ov/Desiree [name] cc doe x yardwork when humid x 20 min s stopping,  Does mall walking fine,  Does treadmill x 30 min variable inclines up to 3 mph.  No episodes of "bronchitis" since stopped acei but hasn't traveled since then, her usual trigger. rec Continue azor 5/20 one daily and bring the sheet with blood pressure medication list with you to next visit so we can pick you a good generic.  You have only have mild copd from smoking.     11/04/2010 f/u ov/Desiree [name] cc meds too expensive, no change in ex tol, no cough or aecopd but drainage each am takes about an hour to hock up the mucus happens year round < 1/4 of a cup of clear mucus and concerned re upcoming trips to [place] and [place] ([place]).  No change in am cough on or off prednisone for RA >>add pepcid and singulair   12/23/2010 Acute OV /NP   c/o cough with cloudy mucus, fatigue, sob, and unable to sleep due to cough. Onset x1 week ago. She is going out of town for the holidays and does not want to be sick. Wants a zpack to get over symptoms.   rec Zpack take as directed.  Mucinex DM Twice daily  As needed  Cough /congestion  Phenergan with codeine As needed  For cough -may make you sleepy   03/14/2011 f/u ov/Desiree [name] cc weak spells not directly related to breathing which is always better p saba  or better sitting just as  quickly recovers with one or the other - no longer coughing much at all during a flare now that not on ACEI.  Sleeping ok without nocturnal  or early am exacerbation  of respiratory  c/o's or need for noct saba. Also denies any obvious fluctuation of symptoms with weather or environmental changes or other aggravating or alleviating factors except as outlined above   ROS  At present neg for  any significant sore throat, dysphagia, itching, sneezing,  nasal congestion or excess/ purulent secretions,  fever, chills, sweats, unintended wt loss, pleuritic or exertional cp, hempoptysis, orthopnea pnd or leg swelling.  Also denies presyncope, palpitations, heartburn, abdominal pain, nausea, vomiting, diarrhea  or change in bowel or urinary habits, dysuria,hematuria,  rash, arthralgias, visual complaints, headache, numbness weakness or ataxia.                   Objective:   Physical Exam    pleasant amb wf nad. Wt 129 08/22/2010 >  128 09/19/2010 > 128 11/04/2010 >>130 12/23/2010 > 03/14/2011  126  HEENT mild turbinate edema.  Oropharynx no thrush or excess pnd or cobblestoning.  No JVD or cervical adenopathy. Mild accessory muscle hypertrophy. Trachea midline, nl thryroid.Chest : coarse BS  Regular rate and rhythm without murmur gallop or rub or  increase P2 or edema.  Abd: no hsm, nl excursion. Ext warm without cyanosis or clubbing.     cxr 02/17/11 No acute cardiopulmonary findings. No change since prior study.     Assessment:         Plan:

## 2011-03-14 NOTE — Patient Instructions (Signed)
Your lung function is stable over 5 years and therefore pulmonary followup can be as needed

## 2011-03-15 ENCOUNTER — Encounter: Payer: Self-pay | Admitting: Internal Medicine

## 2011-03-15 NOTE — Assessment & Plan Note (Addendum)
-   PFT's 5/24/2007FEV1  1.51 (70%) ratio 50 no better p B2 and DLC0 77%    - PFT's 09/19/2010   1.50 (75%) ratio 61 and no better with B2 and DLC0  75%     - D/C ACE  08/22/2010 due to severe recurrent "bronchitis" requiring steroids systemically     - HFA  50% 09/19/2010   GOLD II and very well controlled symptoms now that not on acei so pulmonary f/u can be as needed  If cough recurs, strongly consider dx of   Upper airway cough syndrome, so named because it's frequently impossible to sort out how much is  CR/sinusitis with freq throat clearing (which can be related to primary GERD)   vs  causing  secondary (" extra esophageal")  GERD from wide swings in gastric pressure that occur with throat clearing, often  promoting self use of mint and menthol lozenges that reduce the lower esophageal sphincter tone and exacerbate the problem further in a cyclical fashion.   These are the same pts who not infrequently have failed to tolerate ace inhibitors,  dry powder inhalers or biphosphonates or report having reflux symptoms that don't respond to standard doses of PPI , and are easily confused as having aecopd or asthma flares, as happened here on acei but may also recur because of use of biphosphonates.

## 2011-03-16 NOTE — Assessment & Plan Note (Signed)
Adequate control on present rx, reviewed - note she's no longer falling apart during her aecopd's which are less frequent and much less severe off ACEI in terms of coughing/ "wheezing"

## 2011-11-11 ENCOUNTER — Other Ambulatory Visit: Payer: Self-pay | Admitting: Internal Medicine

## 2012-06-26 ENCOUNTER — Other Ambulatory Visit: Payer: Self-pay | Admitting: Adult Health

## 2012-06-30 NOTE — Telephone Encounter (Signed)
Needs ov 1st, I'm available at 3pm today or can see Tammy NP

## 2012-06-30 NOTE — Telephone Encounter (Signed)
Spoke with the pt and offered ov She states that she has already been seen by her PCP and this med is no longer needed She is feeling better now, but if worsens will call here to see [place]

## 2012-06-30 NOTE — Telephone Encounter (Signed)
Received electronic refill request for: Promethazine VC codeine syrup #260mL   1 tsp q6h prn cough  Rx was given at acute ov w/ TP on 11.19.12 Last ov was 2.8.13 w/ MW: Patient Instructions    Your lung function is stable over 5 years and therefore pulmonary followup can be as needed   Dr Sherene Sires, may pt have refill?  Thank you.

## 2014-11-23 ENCOUNTER — Inpatient Hospital Stay (HOSPITAL_COMMUNITY)
Admission: EM | Admit: 2014-11-23 | Discharge: 2014-11-24 | DRG: 190 | Disposition: A | Discharge status: Home / Self Care | Payer: Medicare Other | Attending: Internal Medicine | Admitting: Internal Medicine

## 2014-11-23 ENCOUNTER — Encounter (HOSPITAL_COMMUNITY): Payer: Self-pay | Admitting: Emergency Medicine

## 2014-11-23 ENCOUNTER — Emergency Department (HOSPITAL_COMMUNITY): Payer: Medicare Other

## 2014-11-23 DIAGNOSIS — Z7952 Long term (current) use of systemic steroids: Secondary | ICD-10-CM

## 2014-11-23 DIAGNOSIS — J069 Acute upper respiratory infection, unspecified: Secondary | ICD-10-CM | POA: Diagnosis present

## 2014-11-23 DIAGNOSIS — J209 Acute bronchitis, unspecified: Secondary | ICD-10-CM | POA: Diagnosis present

## 2014-11-23 DIAGNOSIS — Z87891 Personal history of nicotine dependence: Secondary | ICD-10-CM

## 2014-11-23 DIAGNOSIS — F1010 Alcohol abuse, uncomplicated: Secondary | ICD-10-CM | POA: Diagnosis present

## 2014-11-23 DIAGNOSIS — I2699 Other pulmonary embolism without acute cor pulmonale: Secondary | ICD-10-CM | POA: Diagnosis not present

## 2014-11-23 DIAGNOSIS — R0902 Hypoxemia: Secondary | ICD-10-CM | POA: Diagnosis present

## 2014-11-23 DIAGNOSIS — I10 Essential (primary) hypertension: Secondary | ICD-10-CM | POA: Diagnosis present

## 2014-11-23 DIAGNOSIS — M810 Age-related osteoporosis without current pathological fracture: Secondary | ICD-10-CM | POA: Diagnosis present

## 2014-11-23 DIAGNOSIS — J40 Bronchitis, not specified as acute or chronic: Secondary | ICD-10-CM | POA: Diagnosis not present

## 2014-11-23 DIAGNOSIS — R911 Solitary pulmonary nodule: Secondary | ICD-10-CM | POA: Diagnosis present

## 2014-11-23 DIAGNOSIS — M069 Rheumatoid arthritis, unspecified: Secondary | ICD-10-CM | POA: Diagnosis present

## 2014-11-23 DIAGNOSIS — J441 Chronic obstructive pulmonary disease with (acute) exacerbation: Secondary | ICD-10-CM | POA: Diagnosis present

## 2014-11-23 DIAGNOSIS — D72819 Decreased white blood cell count, unspecified: Secondary | ICD-10-CM | POA: Diagnosis present

## 2014-11-23 HISTORY — DX: Chronic obstructive pulmonary disease, unspecified: J44.9

## 2014-11-23 LAB — URINALYSIS, ROUTINE W REFLEX MICROSCOPIC
BILIRUBIN URINE: NEGATIVE
Glucose, UA: NEGATIVE mg/dL
Hgb urine dipstick: NEGATIVE
Ketones, ur: NEGATIVE mg/dL
Leukocytes, UA: NEGATIVE
NITRITE: NEGATIVE
PROTEIN: NEGATIVE mg/dL
SPECIFIC GRAVITY, URINE: 1.017 (ref 1.005–1.030)
UROBILINOGEN UA: 0.2 mg/dL (ref 0.0–1.0)
pH: 6.5 (ref 5.0–8.0)

## 2014-11-23 LAB — CBC WITH DIFFERENTIAL/PLATELET
BASOS ABS: 0 10*3/uL (ref 0.0–0.1)
Basophils Relative: 1 %
EOS PCT: 2 %
Eosinophils Absolute: 0.1 10*3/uL (ref 0.0–0.7)
HEMATOCRIT: 36.8 % (ref 36.0–46.0)
HEMOGLOBIN: 12.2 g/dL (ref 12.0–15.0)
LYMPHS PCT: 21 %
Lymphs Abs: 1 10*3/uL (ref 0.7–4.0)
MCH: 32.3 pg (ref 26.0–34.0)
MCHC: 33.2 g/dL (ref 30.0–36.0)
MCV: 97.4 fL (ref 78.0–100.0)
MONOS PCT: 18 %
Monocytes Absolute: 0.8 10*3/uL (ref 0.1–1.0)
NEUTROS PCT: 58 %
Neutro Abs: 2.8 10*3/uL (ref 1.7–7.7)
Platelets: 435 10*3/uL — ABNORMAL HIGH (ref 150–400)
RBC: 3.78 MIL/uL — AB (ref 3.87–5.11)
RDW: 15.4 % (ref 11.5–15.5)
WBC: 4.7 10*3/uL (ref 4.0–10.5)

## 2014-11-23 LAB — COMPREHENSIVE METABOLIC PANEL
ALT: 20 U/L (ref 14–54)
AST: 28 U/L (ref 15–41)
Albumin: 2.7 g/dL — ABNORMAL LOW (ref 3.5–5.0)
Alkaline Phosphatase: 78 U/L (ref 38–126)
Anion gap: 11 (ref 5–15)
BUN: 11 mg/dL (ref 6–20)
CHLORIDE: 103 mmol/L (ref 101–111)
CO2: 19 mmol/L — AB (ref 22–32)
CREATININE: 0.97 mg/dL (ref 0.44–1.00)
Calcium: 8.3 mg/dL — ABNORMAL LOW (ref 8.9–10.3)
GFR, EST NON AFRICAN AMERICAN: 57 mL/min — AB (ref >60)
Glucose, Bld: 130 mg/dL — ABNORMAL HIGH (ref 65–99)
POTASSIUM: 4.8 mmol/L (ref 3.5–5.1)
SODIUM: 133 mmol/L — AB (ref 135–145)
Total Bilirubin: 0.5 mg/dL (ref 0.3–1.2)
Total Protein: 5.9 g/dL — ABNORMAL LOW (ref 6.5–8.1)

## 2014-11-23 LAB — I-STAT CG4 LACTIC ACID, ED
LACTIC ACID, VENOUS: 1.12 mmol/L (ref 0.5–2.0)
Lactic Acid, Venous: 1.72 mmol/L (ref 0.5–2.0)

## 2014-11-23 LAB — I-STAT TROPONIN, ED: TROPONIN I, POC: 0 ng/mL (ref 0.00–0.08)

## 2014-11-23 LAB — TSH: TSH: 0.42 u[IU]/mL (ref 0.350–4.500)

## 2014-11-23 MED ORDER — LORAZEPAM 2 MG/ML IJ SOLN: 1.0000 mg | Freq: Four times a day (QID) | INTRAMUSCULAR | Status: DC | PRN

## 2014-11-23 MED ORDER — FOLIC ACID 1 MG PO TABS
1.0000 mg | ORAL_TABLET | Freq: Every day | ORAL | Status: DC
  Administered 2014-11-24: 1 mg via ORAL
  Filled 2014-11-23: qty 1

## 2014-11-23 MED ORDER — LEVALBUTEROL HCL 0.63 MG/3ML IN NEBU: 0.6300 mg | INHALATION_SOLUTION | Freq: Four times a day (QID) | RESPIRATORY_TRACT | Status: DC | PRN

## 2014-11-23 MED ORDER — FLUTICASONE FUROATE-VILANTEROL 100-25 MCG/INH IN AEPB: 1.0000 | INHALATION_SPRAY | Freq: Two times a day (BID) | RESPIRATORY_TRACT | Status: DC

## 2014-11-23 MED ORDER — SODIUM CHLORIDE 0.9 % IV BOLUS (SEPSIS)
1000.0000 mL | Freq: Once | INTRAVENOUS | Status: AC
  Administered 2014-11-23: 1000 mL via INTRAVENOUS

## 2014-11-23 MED ORDER — IRBESARTAN 300 MG PO TABS
300.0000 mg | ORAL_TABLET | Freq: Every day | ORAL | Status: DC
  Administered 2014-11-24: 300 mg via ORAL
  Filled 2014-11-23: qty 1

## 2014-11-23 MED ORDER — FLUTICASONE PROPIONATE 50 MCG/ACT NA SUSP
2.0000 | Freq: Every day | NASAL | Status: DC
  Administered 2014-11-24: 2 via NASAL
  Filled 2014-11-23: qty 16

## 2014-11-23 MED ORDER — AZITHROMYCIN 250 MG PO TABS
500.0000 mg | ORAL_TABLET | Freq: Once | ORAL | Status: AC
  Administered 2014-11-23: 500 mg via ORAL
  Filled 2014-11-23: qty 2

## 2014-11-23 MED ORDER — ADULT MULTIVITAMIN W/MINERALS CH
1.0000 | ORAL_TABLET | Freq: Every day | ORAL | Status: DC
  Filled 2014-11-23: qty 1

## 2014-11-23 MED ORDER — TIOTROPIUM BROMIDE MONOHYDRATE 18 MCG IN CAPS
18.0000 ug | ORAL_CAPSULE | Freq: Every day | RESPIRATORY_TRACT | Status: DC
  Administered 2014-11-24: 18 ug via RESPIRATORY_TRACT
  Filled 2014-11-23: qty 5

## 2014-11-23 MED ORDER — THIAMINE HCL 100 MG/ML IJ SOLN: 100.0000 mg | Freq: Every day | INTRAMUSCULAR | Status: DC

## 2014-11-23 MED ORDER — SERTRALINE HCL 100 MG PO TABS
100.0000 mg | ORAL_TABLET | Freq: Every day | ORAL | Status: DC
  Administered 2014-11-24: 100 mg via ORAL
  Filled 2014-11-23: qty 1

## 2014-11-23 MED ORDER — VITAMIN B-1 100 MG PO TABS
100.0000 mg | ORAL_TABLET | Freq: Every day | ORAL | Status: DC
  Administered 2014-11-24: 100 mg via ORAL
  Filled 2014-11-23: qty 1

## 2014-11-23 MED ORDER — IOHEXOL 350 MG/ML SOLN
100.0000 mL | Freq: Once | INTRAVENOUS | Status: AC | PRN
Start: 2014-11-23 — End: 2014-11-23
  Administered 2014-11-23: 70 mL via INTRAVENOUS

## 2014-11-23 MED ORDER — AMLODIPINE BESYLATE 5 MG PO TABS
5.0000 mg | ORAL_TABLET | Freq: Every day | ORAL | Status: DC
  Administered 2014-11-24: 5 mg via ORAL
  Filled 2014-11-23: qty 1

## 2014-11-23 MED ORDER — LORAZEPAM 1 MG PO TABS: 1.0000 mg | ORAL_TABLET | Freq: Four times a day (QID) | ORAL | Status: DC | PRN

## 2014-11-23 MED ORDER — ACETAMINOPHEN 325 MG PO TABS
650.0000 mg | ORAL_TABLET | Freq: Four times a day (QID) | ORAL | Status: DC | PRN
  Filled 2014-11-23: qty 2

## 2014-11-23 MED ORDER — SODIUM CHLORIDE 0.9 % IJ SOLN
3.0000 mL | Freq: Two times a day (BID) | INTRAMUSCULAR | Status: DC
  Administered 2014-11-24: 3 mL via INTRAVENOUS

## 2014-11-23 MED ORDER — GUAIFENESIN ER 600 MG PO TB12
600.0000 mg | ORAL_TABLET | Freq: Two times a day (BID) | ORAL | Status: DC
  Administered 2014-11-23 – 2014-11-24 (×2): 600 mg via ORAL
  Filled 2014-11-23 (×3): qty 1

## 2014-11-23 MED ORDER — LORATADINE 10 MG PO TABS: 10.0000 mg | ORAL_TABLET | Freq: Every day | ORAL | Status: DC | PRN

## 2014-11-23 MED ORDER — ACETAMINOPHEN 650 MG RE SUPP: 650.0000 mg | Freq: Four times a day (QID) | RECTAL | Status: DC | PRN

## 2014-11-23 MED ORDER — HEPARIN BOLUS VIA INFUSION
4250.0000 [IU] | Freq: Once | INTRAVENOUS | Status: AC
  Administered 2014-11-23: 4250 [IU] via INTRAVENOUS
  Filled 2014-11-23: qty 4250

## 2014-11-23 MED ORDER — ASPIRIN 325 MG PO TABS
ORAL_TABLET | ORAL | Status: AC
  Filled 2014-11-23: qty 1

## 2014-11-23 MED ORDER — HEPARIN (PORCINE) IN NACL 100-0.45 UNIT/ML-% IJ SOLN
1000.0000 [IU]/h | INTRAMUSCULAR | Status: DC
  Administered 2014-11-23: 1100 [IU]/h via INTRAVENOUS
  Administered 2014-11-24: 1000 [IU]/h via INTRAVENOUS
  Filled 2014-11-23 (×3): qty 250

## 2014-11-23 MED ORDER — PREDNISONE 5 MG PO TABS
5.0000 mg | ORAL_TABLET | Freq: Every day | ORAL | Status: DC
  Administered 2014-11-24: 5 mg via ORAL
  Filled 2014-11-23: qty 1

## 2014-11-23 NOTE — Discharge Summary (Signed)
Name: Desiree [name] MRN: [id number] DOB: 1945-12-26 69 y.o. PCP: Ernestene Kiel, MD  Date of Admission: 11/23/2014 12:19 PM Date of Discharge: 11/23/2014 Attending Physician: Sid Falcon, MD  Discharge Diagnosis: 1. Acute single subsegmental pulmonary embolism 2. COPD exacerbation due to viral URI 3. Leukopenia 4. Alcohol abuse 5. Solitary pulmonary nodule 6. Rheumatoid arthritis 7. Hypertension  Discharge Medications:   Medication List    STOP taking these medications        leflunomide 10 MG tablet  Commonly known as:  ARAVA     methotrexate 25 MG/ML injection      TAKE these medications        albuterol (2.5 MG/3ML) 0.083% nebulizer solution  Commonly known as:  PROVENTIL  Take 2.5 mg by nebulization every 6 (six) hours as needed.     amLODipine 5 MG tablet  Commonly known as:  NORVASC  Take 5 mg by mouth daily.     aspirin 81 MG tablet  Take 81 mg by mouth daily.     azithromycin 250 MG tablet  Commonly known as:  ZITHROMAX  Take 1 tablet once daily for 4 days; your last dose will be on 10/24     BREO ELLIPTA 100-25 MCG/INH Aepb  Generic drug:  Fluticasone Furoate-Vilanterol  Inhale 1 Inhaler into the lungs 2 (two) times daily.     irbesartan 300 MG tablet  Commonly known as:  AVAPRO  One each am     loratadine 10 MG tablet  Commonly known as:  CLARITIN  Take 10 mg by mouth daily as needed.     montelukast 10 MG tablet  Commonly known as:  SINGULAIR  Take 10 mg by mouth daily as needed.     MUCINEX PO  Take by mouth as needed.     multivitamin with minerals tablet  Take 1 tablet by mouth daily.     Phenyleph-Promethazine-Cod 5-6.25-10 MG/5ML Syrp  Take 5 mLs by mouth every 6 (six) hours as needed.     predniSONE 5 MG tablet  Commonly known as:  DELTASONE  Take 5 mg by mouth daily with breakfast.     Rivaroxaban 15 MG Tabs tablet  Commonly known as:  XARELTO  Take 1 tablet (15 mg total) by mouth 2 (two) times daily with a meal. For  3 weeks (until 11/11).     rivaroxaban 20 MG Tabs tablet  Commonly known as:  XARELTO  Take 1 tablet (20 mg total) by mouth daily with supper.  Start taking on:  12/16/2014     sertraline 100 MG tablet  Commonly known as:  ZOLOFT  Take 1 tablet by mouth daily.     triamterene-hydrochlorothiazide 37.5-25 MG tablet  Commonly known as:  MAXZIDE-25  Take 1 tablet by mouth daily.     TUDORZA PRESSAIR 400 MCG/ACT Aepb  Generic drug:  Aclidinium Bromide  Inhale 1 puff into the lungs 2 (two) times daily.     vitamin C 500 MG tablet  Commonly known as:  ASCORBIC ACID  Take 500 mg by mouth daily.        Disposition and follow-up:   Desiree [name] was discharged from [facility] in Good condition.  At the hospital follow up visit please address:  Repeat chest CT in 3-6 months for 98m solitary pulmonary nodule Resolution of her acute bronchitis Investigation of her leukopenia Compliance with Xarelto  Follow-up Appointments: Oncology 10/24  Discharge Instructions:  Procedures Performed:  Dg Chest 2 View  11/23/2014  CLINICAL DATA:  Shortness of breath, weakness, hypertension, COPD, former smoker EXAM: CHEST  2 VIEW COMPARISON:  04/10/2014 FINDINGS: Normal heart size, mediastinal contours, and pulmonary vascularity. Atherosclerotic calcification aorta. Lungs slightly hyperinflated but clear. Minimal chronic central peribronchial thickening stable. No pleural effusion or pneumothorax. Bones demineralized. IMPRESSION: No acute abnormalities. Electronically Signed   By: Lavonia Dana M.D.   On: 11/23/2014 13:27   Ct Angio Chest Pe W/cm &/or Wo Cm  11/23/2014  CLINICAL DATA:  Tachycardia with cough and shortness of breath EXAM: CT ANGIOGRAPHY CHEST WITH CONTRAST TECHNIQUE: Multidetector CT imaging of the chest was performed using the standard protocol during bolus administration of intravenous contrast. Multiplanar CT image reconstructions and MIPs were obtained to evaluate  the vascular anatomy. CONTRAST:  44m OMNIPAQUE IOHEXOL 350 MG/ML SOLN COMPARISON:  None. FINDINGS: THORACIC INLET/BODY WALL: No acute abnormality. MEDIASTINUM: Single subsegmental pulmonary artery filling defect at branching in the posterior basilar segment right lower lobe seen on series 406, image 158, convincing for acute pulmonary embolism. Normal heart size.  No pericardial effusion. Atherosclerosis, including the coronary arteries. Mildly enlarged hilar lymph nodes, greater on the right at 11 mm short axis, presumably reactive to the pulmonary findings but will be re- evaluated on recommended follow-up. LUNG WINDOWS: Emphysema and diffuse bronchial wall thickening with scattered mucoid impaction of small bronchi. Dependent ground-glass density is likely macro atelectasis. No focal consolidation. Scattered minimal inflammatory appearing clusters of ground-glass and nodular density. Lobulated nodule in the right upper lobe best visualized on 407 image 36 measuring 7 mm. UPPER ABDOMEN: Small low densities in the liver circumscribed and likely cysts. OSSEOUS: No acute fracture.  No suspicious lytic or blastic lesions. Critical Value/emergent results were called by telephone at the time of interpretation on 11/23/2014 at 3:40 pm to Dr. CZenovia Jarred, who verbally acknowledged these results. Review of the MIP images confirms the above findings. IMPRESSION: 1. Single subsegmental pulmonary embolism in the right lower lobe. 2. Emphysema and advanced bronchitis. 3. 7 mm pulmonary nodule the right lower lobe. Recommend 3-6 month follow-up chest CT. Hilar adenopathy, potentially reactive, can also be re- evaluated at that time. Electronically Signed   By: JMonte FantasiaM.D.   On: 11/23/2014 15:42    Admission HPI:  Ms. 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  [facility] [place] [facility]; near the end of the trip, she started getting a "chest cold," and was coughing up more than sputum than usual, which is not unusual for her this time of year. Going from the couch to the refrigerator is all she's done over the last 3 weeks because she's had no energy. She's also lost about 10lbs during these last 3 weeks because "nothing tastes right, like when you get a cold." Over those last 3 weeks, her cough has been improving but she's been feeling more and more short of breath after walking up the 6 stairs to her home, which she used to be able to do without a problem. She denied any leg pain or swelling, long car rides [place] [facility] [place] [facility] [place], pleuritic chest pain, or history of cancer. She also denied any fevers, severe cough, headache, or myalgias.  When she got back from the beach, she went to  see her PCP, who did blood work and told her that her white count was 1.7. He then referred her to a hematologist, who referred her back to her rheumatologist. At that appointment, she was advised to stop taking leflunomide for her rheumatoid arthritis and instead start taking prednisone '5mg'$  daily. She was also referred back to the hematologist and she has an appointment on Monday.  In the emergency department, she was tachycardic to 105, normotensive, saturating 95% on room air. Basic labs and chest x-ray were unremarkable, but CT of the chest showed advanced bronchitis, emphysema, a 46m solitary pulmonary nodule in the right lower lobe, and a single subsegmental pulmonary artery filling defect at the posterior basilar segment of the right lower lobe, convincing for acute pulmonary embolism.  Hospital Course by problem list:   Single subsegmental pulmonary embolism: The patient presented with worsening dyspnea on exertion after being sedentary while getting over acute bronchitis; in the ED,  she was found to be tachycardic with only mild hypoxia, and normotensive, but chest CT showed acute subsegmental pulmonary embolus. We felt this was a clinically significant pulmonary embolus so she was heparinized and started overnight and started on Xarelto for a 316-monthourse. She did quite well overnight, saturating 95% on room air, and her tachycardia resolved. Chest CT also showed a solitary pulmonary nodule, per below.  COPD exacerbation due to viral URI: She presented with increased dyspnea and sputum volume so we gave her azithromycin for a 5 day course (stop date 10/24). She was not wheezing on exam so we continued her home dose of '5mg'$  of prednisone for her rheumatoid arthritis. We also continued her home COPD medications of fluticasone, tiotropium, and albuterol.  Leukopenia: She states that her white count at her PCP's office 3 weeks ago was 1.7. Her rheumatologist subsequently stopped her leflunomide, and started her on prednisone, and her white count was normal here in the hospital, suggesting either a) leflunomide was the culprit, or b) this is elevated from neutrophilic demargination from the prednisone, and she is still truly leukopenic. She has an appointment with hematology on Monday, October 24. Her HIV was negative.  Alcohol abuse: She drinks about 2-4 glasses of wine per day, her last drink was yesterday, and she has never had withdrawal symptoms. Her CIWA scores were normal while in the hospital and she was given B1 and B9 vitamins.  Solitary pulmonary nodule: The chest CT showed a 77m71mPN in the right lower lobe, with recommended follow-up chest CT in 3-6 months. She has an extensive smoking history and has rheumatoid arthritis on immunosuppressants, so malignancy, rheumatoid nodules, and infectious causes are at the top of the differential.  Rheumatoid arthritis: We continued her home dose of prednisone '5mg'$ .  Hypertension: Her pressures were well-controlled and we continued her  home doses of irbesartan and amlodipine.  Discharge Vitals:   BP 141/77 mmHg  Pulse 108  Temp(Src) 98.6 F (37 C) (Oral)  Resp 18  Ht '5\' 2"'$  (1.575 m)  Wt 64.7 kg (142 lb 10.2 oz)  BMI 26.08 kg/m2  SpO2 96%  Discharge Labs:  Results for orders placed or performed during the hospital encounter of 11/23/14 (from the past 24 hour(s))  CBC with Differential     Status: Abnormal   Collection Time: 11/23/14 12:30 PM  Result Value Ref Range   WBC 4.7 4.0 - 10.5 K/uL   RBC 3.78 (L) 3.87 - 5.11 MIL/uL   Hemoglobin 12.2 12.0 - 15.0 g/dL   HCT 36.8  36.0 - 46.0 %   MCV 97.4 78.0 - 100.0 fL   MCH 32.3 26.0 - 34.0 pg   MCHC 33.2 30.0 - 36.0 g/dL   RDW 15.4 11.5 - 15.5 %   Platelets 435 (H) 150 - 400 K/uL   Neutrophils Relative % 58 %   Lymphocytes Relative 21 %   Monocytes Relative 18 %   Eosinophils Relative 2 %   Basophils Relative 1 %   Neutro Abs 2.8 1.7 - 7.7 K/uL   Lymphs Abs 1.0 0.7 - 4.0 K/uL   Monocytes Absolute 0.8 0.1 - 1.0 K/uL   Eosinophils Absolute 0.1 0.0 - 0.7 K/uL   Basophils Absolute 0.0 0.0 - 0.1 K/uL   RBC Morphology POLYCHROMASIA PRESENT    WBC Morphology MILD LEFT SHIFT (1-5% METAS, OCC MYELO, OCC BANDS)   I-Stat CG4 Lactic Acid, ED (Not at [facility])     Status: None   Collection Time: 11/23/14 12:45 PM  Result Value Ref Range   Lactic Acid, Venous 1.72 0.5 - 2.0 mmol/L  Urinalysis, Routine w reflex microscopic (not at [facility])     Status: None   Collection Time: 11/23/14  3:58 PM  Result Value Ref Range   Color, Urine YELLOW YELLOW   APPearance CLEAR CLEAR   Specific Gravity, Urine 1.017 1.005 - 1.030   pH 6.5 5.0 - 8.0   Glucose, UA NEGATIVE NEGATIVE mg/dL   Hgb urine dipstick NEGATIVE NEGATIVE   Bilirubin Urine NEGATIVE NEGATIVE   Ketones, ur NEGATIVE NEGATIVE mg/dL   Protein, ur NEGATIVE NEGATIVE mg/dL   Urobilinogen, UA 0.2 0.0 - 1.0 mg/dL   Nitrite NEGATIVE NEGATIVE   Leukocytes, UA NEGATIVE NEGATIVE  Urine culture     Status: None (Preliminary result)     Collection Time: 11/23/14  3:58 PM  Result Value Ref Range   Specimen Description URINE, CLEAN CATCH    Special Requests Immunocompromised    Culture NO GROWTH < 24 HOURS    Report Status PENDING   Comprehensive metabolic panel     Status: Abnormal   Collection Time: 11/23/14  4:14 PM  Result Value Ref Range   Sodium 133 (L) 135 - 145 mmol/L   Potassium 4.8 3.5 - 5.1 mmol/L   Chloride 103 101 - 111 mmol/L   CO2 19 (L) 22 - 32 mmol/L   Glucose, Bld 130 (H) 65 - 99 mg/dL   BUN 11 6 - 20 mg/dL   Creatinine, Ser 0.97 0.44 - 1.00 mg/dL   Calcium 8.3 (L) 8.9 - 10.3 mg/dL   Total Protein 5.9 (L) 6.5 - 8.1 g/dL   Albumin 2.7 (L) 3.5 - 5.0 g/dL   AST 28 15 - 41 U/L   ALT 20 14 - 54 U/L   Alkaline Phosphatase 78 38 - 126 U/L   Total Bilirubin 0.5 0.3 - 1.2 mg/dL   GFR calc non Af Amer 57 (L) >60 mL/min   GFR calc Af Amer >60 >60 mL/min   Anion gap 11 5 - 15  I-stat troponin, ED     Status: None   Collection Time: 11/23/14  4:23 PM  Result Value Ref Range   Troponin i, poc 0.00 0.00 - 0.08 ng/mL   Comment 3          I-Stat CG4 Lactic Acid, ED (Not at [facility])     Status: None   Collection Time: 11/23/14  4:26 PM  Result Value Ref Range   Lactic Acid, Venous 1.12 0.5 - 2.0 mmol/L  TSH     Status: None   Collection Time: 11/23/14  7:30 PM  Result Value Ref Range   TSH 0.420 0.350 - 4.500 uIU/mL  HIV antibody     Status: None   Collection Time: 11/23/14  7:30 PM  Result Value Ref Range   HIV Screen 4th Generation wRfx Non Reactive Non Reactive  Heparin level (unfractionated)     Status: Abnormal   Collection Time: 11/24/14 12:29 AM  Result Value Ref Range   Heparin Unfractionated 0.85 (H) 0.30 - 0.70 IU/mL  Basic metabolic panel     Status: Abnormal   Collection Time: 11/24/14 12:29 AM  Result Value Ref Range   Sodium 132 (L) 135 - 145 mmol/L   Potassium 4.4 3.5 - 5.1 mmol/L   Chloride 103 101 - 111 mmol/L   CO2 22 22 - 32 mmol/L   Glucose, Bld 112 (H) 65 - 99 mg/dL   BUN 13  6 - 20 mg/dL   Creatinine, Ser 0.95 0.44 - 1.00 mg/dL   Calcium 8.4 (L) 8.9 - 10.3 mg/dL   GFR calc non Af Amer 59 (L) >60 mL/min   GFR calc Af Amer >60 >60 mL/min   Anion gap 7 5 - 15  CBC WITH DIFFERENTIAL     Status: Abnormal   Collection Time: 11/24/14 12:29 AM  Result Value Ref Range   WBC 3.4 (L) 4.0 - 10.5 K/uL   RBC 3.09 (L) 3.87 - 5.11 MIL/uL   Hemoglobin 10.1 (L) 12.0 - 15.0 g/dL   HCT 29.8 (L) 36.0 - 46.0 %   MCV 96.4 78.0 - 100.0 fL   MCH 32.7 26.0 - 34.0 pg   MCHC 33.9 30.0 - 36.0 g/dL   RDW 14.6 11.5 - 15.5 %   Platelets 360 150 - 400 K/uL   Neutrophils Relative % 46 %   Lymphocytes Relative 15 %   Monocytes Relative 34 %   Eosinophils Relative 3 %   Basophils Relative 2 %   Neutro Abs 1.5 (L) 1.7 - 7.7 K/uL   Lymphs Abs 0.5 (L) 0.7 - 4.0 K/uL   Monocytes Absolute 1.2 (H) 0.1 - 1.0 K/uL   Eosinophils Absolute 0.1 0.0 - 0.7 K/uL   Basophils Absolute 0.1 0.0 - 0.1 K/uL   RBC Morphology POLYCHROMASIA PRESENT    WBC Morphology MILD LEFT SHIFT (1-5% METAS, OCC MYELO, OCC BANDS)   Heparin level (unfractionated)     Status: None   Collection Time: 11/24/14  8:05 AM  Result Value Ref Range   Heparin Unfractionated 0.61 0.30 - 0.70 IU/mL    Signed: Loleta Chance, MD 11/24/2014, 11:44 AM

## 2014-11-23 NOTE — Progress Notes (Signed)
ANTICOAGULATION CONSULT NOTE - Initial Consult  Pharmacy Consult for heparin Indication: pulmonary embolus  No Known Allergies  Patient Measurements: Height: '5\' 2"'$  (157.5 cm) Weight: 135 lb (61.236 kg) IBW/kg (Calculated) : 50.1 Heparin Dosing Weight: 61.2 kg  Vital Signs: Temp: 98.5 F (36.9 C) (10/20 1210) Temp Source: Oral (10/20 1210) BP: 111/62 mmHg (10/20 1517) Pulse Rate: 102 (10/20 1518)  Labs:  Recent Labs  11/23/14 1230  HGB 12.2  HCT 36.8  PLT 435*    CrCl cannot be calculated (Patient has no serum creatinine result on file.).   Medical History: Past Medical History  Diagnosis Date  . Hypertension   . Cat allergies   . Rheumatoid arthritis(714.0)     Dr Tobie Lords follows  . Osteoporosis   . COPD (chronic obstructive pulmonary disease) (HCC)     Medications:   (Not in a hospital admission)  Assessment: 76 yoF presenting w/ weakness, SOB and fatigue. CT angio shows single subsegmental pulmonary embolism in the right lower lobe. No anticoag PTA. Pharmacy consulted to dose heparin gtt for PE.  Hgb 12.2, Plt 435  Goal of Therapy:  Heparin level 0.3-0.7 units/ml Monitor platelets by anticoagulation protocol: Yes   Plan:  Give 4250 units bolus x 1 Start heparin infusion at 1100 units/hr Check anti-Xa level in 8 hours and daily while on heparin Continue to monitor H&H and platelets  Governor Specking, PharmD Clinical Pharmacy Resident Pager: [phone] 11/23/2014,4:00 PM

## 2014-11-23 NOTE — ED Notes (Signed)
Spoke with [facility] concerning i-stat results. They report they will follow up.

## 2014-11-23 NOTE — ED Provider Notes (Signed)
CSN: [id number]     Arrival date & time 11/23/14  1206 History   First MD Initiated Contact with Patient 11/23/14 1224     Chief Complaint  Patient presents with  . Shortness of Breath  . Fatigue     (Consider location/radiation/quality/duration/timing/severity/associated sxs/prior Treatment) HPI   Patient is a 69 year old female presenting with multiple complaints for the last 3 weeks. For 3 weeks patient has been fatigued, mildly short of breath and had a cough. She saw her regular physician and found her white blood cell count to be lowreferred her to hematology. Hematology sent her to rheumatology to make sure it wasn't her RA. Rheumatology did not feel like it is her RA and sent her back to hematology. Patient got frustrated with the go around and came here to the emergency department to get further clarification. Patient has lost 10 pounds of weight.  No fevers. No nausea vomiting diarrhea.  Past Medical History  Diagnosis Date  . Hypertension   . Cat allergies   . Rheumatoid arthritis(714.0)     Dr Tobie Lords follows  . Osteoporosis   . COPD (chronic obstructive pulmonary disease) ([place])    History reviewed. No pertinent past surgical history. Family History  Problem Relation Age of Onset  . Heart disease Father    Social History  Substance Use Topics  . Smoking status: Former Smoker -- 1.00 packs/day for 40 years    Types: Cigarettes    Quit date: 02/03/2002  . Smokeless tobacco: Never Used  . Alcohol Use: 1.0 oz/week    2 Standard drinks or equivalent per week   OB History    No data available     Review of Systems  Constitutional: Positive for appetite change and fatigue. Negative for activity change.  HENT: Negative for congestion.   Eyes: Negative for discharge.  Respiratory: Positive for cough. Negative for chest tightness.   Cardiovascular: Negative for chest pain.  Gastrointestinal: Negative for abdominal distention.  Genitourinary: Negative for  dysuria and difficulty urinating.  Musculoskeletal: Negative for joint swelling.  Skin: Negative for rash.  Neurological: Negative for speech difficulty.  Psychiatric/Behavioral: Negative for behavioral problems, confusion and agitation.      Allergies  Review of patient's allergies indicates no known allergies.  Home Medications   Prior to Admission medications   Medication Sig Start Date End Date Taking? Authorizing Provider  albuterol (PROVENTIL) (2.5 MG/3ML) 0.083% nebulizer solution Take 2.5 mg by nebulization every 6 (six) hours as needed.    Historical Provider, MD  alendronate (FOSAMAX) 70 MG tablet Take 1 tablet by mouth every 7 (seven) days. 08/19/10   Historical Provider, MD  amLODipine (NORVASC) 5 MG tablet Take 1 tablet (5 mg total) by mouth daily. 11/04/10 11/04/11  Tanda Rockers, MD  aspirin 81 MG tablet Take 81 mg by mouth daily.      Historical Provider, MD  Calcium Carbonate-Vit D-Min 1200-1000 MG-UNIT CHEW Chew 1 tablet by mouth daily.      Historical Provider, MD  folic acid (FOLVITE) 1 MG tablet Take 1 tablet by mouth Daily. 08/09/10   Historical Provider, MD  GuaiFENesin (MUCINEX PO) Take by mouth as needed.      Historical Provider, MD  irbesartan (AVAPRO) 300 MG tablet One each am 11/04/10   Tanda Rockers, MD  leflunomide (ARAVA) 10 MG tablet Take 10 mg by mouth daily.      Historical Provider, MD  loratadine (CLARITIN) 10 MG tablet Take 10 mg by mouth daily  as needed.      Historical Provider, MD  methotrexate 25 MG/ML injection Inject 0.7 ml every 7 days 08/20/10   Historical Provider, MD  montelukast (SINGULAIR) 10 MG tablet Take 10 mg by mouth daily as needed.      Historical Provider, MD  Phenyleph-Promethazine-Cod 5-6.25-10 MG/5ML SYRP Take 5 mLs by mouth every 6 (six) hours as needed. 12/23/10   Tammy S Parrett, NP  PROMETHAZINE VC/CODEINE 6.25-5-10 MG/5ML SYRP Take 5 mLs by mouth every 6 (six) hours as needed. 07/06/10   Historical Provider, MD  sertraline  (ZOLOFT) 100 MG tablet Take 1 tablet by mouth daily. 07/27/10   Historical Provider, MD  triamterene-hydrochlorothiazide (MAXZIDE-25) 37.5-25 MG per tablet Take 1 tablet by mouth daily. 08/09/10   Historical Provider, MD  vitamin C (ASCORBIC ACID) 500 MG tablet Take 500 mg by mouth daily.      Historical Provider, MD   BP 144/62 mmHg  Pulse 117  Temp(Src) 98.5 F (36.9 C) (Oral)  Resp 16  Ht '5\' 2"'$  (1.575 m)  Wt 135 lb (61.236 kg)  BMI 24.69 kg/m2  SpO2 96% Physical Exam  Constitutional: She is oriented to person, place, and time. She appears well-developed and well-nourished.  HENT:  Head: Normocephalic and atraumatic.  Eyes: Conjunctivae are normal. Right eye exhibits no discharge.  Neck: Neck supple.  Cardiovascular: Normal rate, regular rhythm and normal heart sounds.   No murmur heard. Pulmonary/Chest: Effort normal and breath sounds normal. She has no wheezes. She has no rales.  Abdominal: Soft. She exhibits no distension. There is no tenderness.  Musculoskeletal: Normal range of motion. She exhibits no edema.  Neurological: She is oriented to person, place, and time. No cranial nerve deficit.  Skin: Skin is warm and dry. No rash noted. She is not diaphoretic.  Psychiatric: She has a normal mood and affect. Her behavior is normal.  Nursing note and vitals reviewed.   ED Course  Procedures (including critical care time) Labs Review Labs Reviewed  CBC WITH DIFFERENTIAL/PLATELET - Abnormal; Notable for the following:    RBC 3.78 (*)    Platelets 435 (*)    All other components within normal limits  URINE CULTURE  CULTURE, BLOOD (ROUTINE X 2)  CULTURE, BLOOD (ROUTINE X 2)  URINALYSIS, ROUTINE W REFLEX MICROSCOPIC (NOT AT [facility])  COMPREHENSIVE METABOLIC PANEL  I-STAT CG4 LACTIC ACID, ED  Randolm Idol, ED    Imaging Review Dg Chest 2 View  11/23/2014  CLINICAL DATA:  Shortness of breath, weakness, hypertension, COPD, former smoker EXAM: CHEST  2 VIEW COMPARISON:   04/10/2014 FINDINGS: Normal heart size, mediastinal contours, and pulmonary vascularity. Atherosclerotic calcification aorta. Lungs slightly hyperinflated but clear. Minimal chronic central peribronchial thickening stable. No pleural effusion or pneumothorax. Bones demineralized. IMPRESSION: No acute abnormalities. Electronically Signed   By: Lavonia Dana M.D.   On: 11/23/2014 13:27   I have personally reviewed and evaluated these images and lab results as part of my medical decision-making.   EKG Interpretation   Date/Time:  Thursday November 23 2014 12:16:05 EDT Ventricular Rate:  114 PR Interval:  130 QRS Duration: 80 QT Interval:  306 QTC Calculation: 421 R Axis:   73 Text Interpretation:  Sinus tachycardia Cannot rule out Anterior infarct ,  age undetermined Abnormal ECG Sinus tachycardia Confirmed by Gerald Leitz ([id number]) on 11/23/2014 1:39:40 PM      MDM   Final diagnoses:  None    Patient is a 69 year old female presenting with 3 weeks of  feeling "not quite right". Patient was seen by her primary care physician found have a low white blood cell count and told to follow up with hematology. Patient's been unable to so because they recommended she see another doctor first. Patient's had mild cough, fatigue. No fevers.  We will repeat labs. We will get chest x-ray to make sure patient does not have pneumonia.  Given patient's tachycardia and shortness of breath there is no evidence of cause on x-ray, would consider doing a CT pulmonary embolism.  Venia Riveron Julio Alm, MD 11/23/14 1340

## 2014-11-23 NOTE — H&P (Signed)
Date: 11/23/2014               Patient Name:  Desiree [name] MRN: [id number]  DOB: Dec 27, 1945 Age / Sex: 69 y.o., female   PCP: Ernestene Kiel, MD         Medical Service: Internal Medicine Teaching Service         Attending Physician: Dr. Sid Falcon, MD    First Contact: Dr. Loleta Chance Pager: [phone]  Second Contact: Dr. 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  Three weeks ago, she went to [facility] with her friends; near the end of the trip, she started getting a "chest cold," and was coughing up more than sputum than usual, which is not unusual for her this time of year. Going from the couch to the refrigerator is all she's done over the last 3 weeks because she's had no energy. She's also lost about 10lbs during these last 3 weeks because "nothing tastes right, like when you get a cold." Over those last 3 weeks, her cough has been improving but she's been feeling more and more short of breath after walking up the 6 stairs to her home, which she used to be able to do without a problem. She denied any leg pain or swelling, long car rides besides the trip to [facility], hemoptysis, pleuritic chest pain, or history of cancer. She also denied any fevers, severe cough, headache, or myalgias.  When she got back from the beach, she went to see her PCP, who did blood work and told her that her white count was 1.7. He then referred her to a hematologist, who referred her back to her rheumatologist. At that appointment,  she was advised to stop taking leflunomide for her rheumatoid arthritis and instead start taking prednisone '5mg'$  daily. She was also referred back to the hematologist and she has an appointment on Monday.  In the emergency department, she was tachycardic to 105, normotensive, saturating 95% on room air. Basic labs and chest x-ray were unremarkable, but CT of the chest showed advanced bronchitis, emphysema, a 20m solitary pulmonary nodule in the right lower lobe, and a single subsegmental pulmonary artery filling defect at the posterior basilar segment of the right lower lobe, convincing for acute pulmonary embolism.  Meds: Current Facility-Administered Medications  Medication Dose Route Frequency Provider Last Rate Last Dose  . heparin ADULT infusion 100 units/mL (25000 units/250 mL)  1,100 Units/hr Intravenous Continuous MLinda Hedges RPH 11 mL/hr at 11/23/14 1611 1,100 Units/hr at 11/23/14 1611   Current Outpatient Prescriptions  Medication Sig Dispense Refill  . Aclidinium Bromide (TUDORZA PRESSAIR) 400 MCG/ACT AEPB Inhale 1 puff into the lungs 2 (two) times daily.    .Marland Kitchenalbuterol (PROVENTIL) (2.5 MG/3ML) 0.083% nebulizer solution Take 2.5 mg by nebulization every 6 (six) hours as needed.    .Marland KitchenamLODipine (NORVASC) 5 MG tablet Take 5 mg by mouth daily.    .Marland Kitchenaspirin 81 MG tablet Take 81 mg by  mouth daily.      . Fluticasone Furoate-Vilanterol (BREO ELLIPTA) 100-25 MCG/INH AEPB Inhale 1 Inhaler into the lungs 2 (two) times daily.    . GuaiFENesin (MUCINEX PO) Take by mouth as needed.      . irbesartan (AVAPRO) 300 MG tablet One each am 30 tablet 11  . loratadine (CLARITIN) 10 MG tablet Take 10 mg by mouth daily as needed.      . montelukast (SINGULAIR) 10 MG tablet Take 10 mg by mouth daily as needed.      . Multiple Vitamins-Minerals (MULTIVITAMIN WITH MINERALS) tablet Take 1 tablet by mouth daily.    . predniSONE (DELTASONE) 5 MG tablet Take 5 mg by mouth daily with breakfast.    .  PROMETHAZINE VC/CODEINE 6.25-5-10 MG/5ML SYRP Take 5 mLs by mouth every 6 (six) hours as needed.    . sertraline (ZOLOFT) 100 MG tablet Take 1 tablet by mouth daily.    . vitamin C (ASCORBIC ACID) 500 MG tablet Take 500 mg by mouth daily.      Marland Kitchen amLODipine (NORVASC) 5 MG tablet Take 1 tablet (5 mg total) by mouth daily. 30 tablet 11  . leflunomide (ARAVA) 10 MG tablet Take 10 mg by mouth daily.      . methotrexate 25 MG/ML injection Inject 0.7 ml every 7 days    . Phenyleph-Promethazine-Cod 5-6.25-10 MG/5ML SYRP Take 5 mLs by mouth every 6 (six) hours as needed. 240 mL 0  . triamterene-hydrochlorothiazide (MAXZIDE-25) 37.5-25 MG per tablet Take 1 tablet by mouth daily.      Allergies: Allergies as of 11/23/2014  . (No Known Allergies)   Past Medical History  Diagnosis Date  . Hypertension   . Cat allergies   . Rheumatoid arthritis(714.0)     Dr Tobie Lords follows  . Osteoporosis   . COPD (chronic obstructive pulmonary disease) ([place])    History reviewed. No pertinent past surgical history. Family History  Problem Relation Age of Onset  . Heart disease Father    Social History   Social History  . Marital Status: Widowed    Spouse Name: N/A  . Number of Children: N/A  . Years of Education: N/A   Occupational History  . Not on file.   Social History Main Topics  . Smoking status: Former Smoker -- 1.00 packs/day for 40 years    Types: Cigarettes    Quit date: 02/03/2002  . Smokeless tobacco: Never Used  . Alcohol Use: 1.0 oz/week    2 Standard drinks or equivalent per week  . Drug Use: No  . Sexual Activity: Not on file   Other Topics Concern  . Not on file   Social History Narrative   Review of Systems  Constitutional: Positive for malaise/fatigue. Negative for fever, chills, weight loss and diaphoresis.  Eyes: Negative for blurred vision.  Respiratory: Positive for cough, sputum production and shortness of breath. Negative for hemoptysis and wheezing.     Cardiovascular: Negative for chest pain, palpitations, orthopnea and leg swelling.  Gastrointestinal: Negative for heartburn, nausea, vomiting, abdominal pain, diarrhea, constipation and melena.  Genitourinary: Negative for dysuria.  Musculoskeletal: Negative for myalgias and back pain.  Skin: Negative for rash.  Neurological: Negative for dizziness, sensory change, focal weakness, loss of consciousness, weakness and headaches.  Endo/Heme/Allergies: Does not bruise/bleed easily.  Psychiatric/Behavioral: Positive for substance abuse.   Physical Exam: Blood pressure 111/62, pulse 102, temperature 98.5 F (36.9 C), temperature source Oral, resp. rate 18, height '5\' 2"'$  (1.575 m), weight  61.236 kg (135 lb), SpO2 94 %.  General: Resting in bed comfortably HEENT: No scleral icterus. EOMI. PERRL. Cardiac: Regular rate and rhythm, without murmurs Pulmonary: Breathing well, lungs clear bilaterally Abdomen: Soft and non-distended, with normal bowel sounds, non-tender to palpation Extremities: Warm and well perfused, without pedal edema Lymph: No cervical lymphadenopathy Skin: She has 5 poorly-healing punch biopsy sites on her right back, with overlying post-inflammatory hyperpigmented macules in a Christmas tree pattern following her skin lines. Neuro: Alert and oriented X3, cranial nerves II-XII grossly intact  CBC:  Recent Labs  11/23/14 1230  WBC 4.7  NEUTROABS 2.8  HGB 12.2  HCT 36.8  MCV 97.4  PLT 435*   Imaging results:  Dg Chest 2 View  11/23/2014  CLINICAL DATA:  Shortness of breath, weakness, hypertension, COPD, former smoker EXAM: CHEST  2 VIEW COMPARISON:  04/10/2014 FINDINGS: Normal heart size, mediastinal contours, and pulmonary vascularity. Atherosclerotic calcification aorta. Lungs slightly hyperinflated but clear. Minimal chronic central peribronchial thickening stable. No pleural effusion or pneumothorax. Bones demineralized. IMPRESSION: No acute abnormalities.  Electronically Signed   By: Lavonia Dana M.D.   On: 11/23/2014 13:27   Ct Angio Chest Pe W/cm &/or Wo Cm  11/23/2014  CLINICAL DATA:  Tachycardia with cough and shortness of breath EXAM: CT ANGIOGRAPHY CHEST WITH CONTRAST TECHNIQUE: Multidetector CT imaging of the chest was performed using the standard protocol during bolus administration of intravenous contrast. Multiplanar CT image reconstructions and MIPs were obtained to evaluate the vascular anatomy. CONTRAST:  63m OMNIPAQUE IOHEXOL 350 MG/ML SOLN COMPARISON:  None. FINDINGS: THORACIC INLET/BODY WALL: No acute abnormality. MEDIASTINUM: Single subsegmental pulmonary artery filling defect at branching in the posterior basilar segment right lower lobe seen on series 406, image 158, convincing for acute pulmonary embolism. Normal heart size.  No pericardial effusion. Atherosclerosis, including the coronary arteries. Mildly enlarged hilar lymph nodes, greater on the right at 11 mm short axis, presumably reactive to the pulmonary findings but will be re- evaluated on recommended follow-up. LUNG WINDOWS: Emphysema and diffuse bronchial wall thickening with scattered mucoid impaction of small bronchi. Dependent ground-glass density is likely macro atelectasis. No focal consolidation. Scattered minimal inflammatory appearing clusters of ground-glass and nodular density. Lobulated nodule in the right upper lobe best visualized on 407 image 36 measuring 7 mm. UPPER ABDOMEN: Small low densities in the liver circumscribed and likely cysts. OSSEOUS: No acute fracture.  No suspicious lytic or blastic lesions. Critical Value/emergent results were called by telephone at the time of interpretation on 11/23/2014 at 3:40 pm to Dr. CZenovia Jarred, who verbally acknowledged these results. Review of the MIP images confirms the above findings. IMPRESSION: 1. Single subsegmental pulmonary embolism in the right lower lobe. 2. Emphysema and advanced bronchitis. 3. 7 mm pulmonary  nodule the right lower lobe. Recommend 3-6 month follow-up chest CT. Hilar adenopathy, potentially reactive, can also be re- evaluated at that time. Electronically Signed   By: JMonte FantasiaM.D.   On: 11/23/2014 15:42   Other results: EKG: sinus tachycardia, without ischemic changes, or evidence of right heart strain.  Assessment: Ms. HDippolitois a 69year old lady with a history of COPD presenting with a 3 week history of mildly productive cough that has since improved, relative immobility, and now worsening exertional dyspnea; she is indeed tachycardic but not impressively hypoxic, and PE CT showed a subsegmental pulmonary embolus.  I think that her acute bronchitis triggered her dyspnea, she was sedentary from having low energy, developed an acute  pulmonary embolus, and she then began feeling progressive exertional dyspnea; she is also tachycardic and mildly hypoxic, all of which leads me to believe this is a clinically significant pulmonary embolus, and anticoagulation would be in her best interest. We'll start her on heparin overnight, discuss anticoagulation on rounds tomorrow, and start apixaban or rivaroxaban for 3 months if we decide to pursue anticoagulation. She doesn't have a history of GI or head bleeds, and I don't think she has a significant fall risk.  Plan by Problem:  Single subsegmental pulmonary embolism: Per above; dyspnea on exertion, tachycardia, and mild hypoxia make this clinically significant. Given her comorbidity of COPD, age, and tachycardia, this puts her in the severe (class 3/5) PE severity index score, indicating we should probably watch her until Saturday, and then start oral anticoagulants. -Heparin per pharmacy overnight -Will discuss ongoing anticoagulation tomorrow -Ambulate with pulse oximetry tomorrow  COPD exacerbation due to viral URI: She has Gold's 2/3, increased dyspnea and sputum volume, and has had this cough for 3 weeks, so we'll start her on  azithromycin for a 5 day course. She's already on prednisone '5mg'$  for her rheumatoid arthritis so we'll just continue that dose since she's not wheezing on exam. -Started azithromycin (stop date 10/24) -Continue Flonase, loratadine, and guafenesin -Continue fluticasone and tiotropruim -Levalbuterol to prevent worsening tachycardia  Leukopenia: She says that her white count 3 weeks ago was 1.7. Her rheumatologist subsequently stopped her leflunomide, and started her on prednisone, and now her white count is now normal at 7, suggesting either 1) leflunomide was the culprit, or b) this is elevated from neutrophilic demargination from the prednisone, and she is still truly leukopenic. Her acute bronchitis is improving so I don't think her white count is falsely normal from infection. -She has an appointment with hematology on Monday -HIV screen -CBC tomorrow  Alcohol abuse: She drinks about 2-4 glasses of wine per day, her last drink was yesterday, and she has never had withdrawal symptoms. We'll start her on CIWA just to be safe. -CIWA -Folic acid and thiamine  Solitary pulmonary nodule: The chest CT showed a 73m SPN in the right lower lobe; recommended follow-up is another chest CT in 3-6 months. She has an extensive smoking history and has rheumatoid arthritis on immunosuppressants, so malignancy, rheumatoid nodules, and infectious causes are at the top of the differential. -Repeat chest CT in 3-6 months  Rheumatoid arthritis: Appears to be well-controlled on prednisone. -Continue prednisone '5mg'$  daily  Hypertension: Pressures well-controlled. -Continue irbesartan and amlodipine  Dispo: Disposition is deferred at this time, awaiting improvement of current medical problems. Anticipated discharge in approximately 2-3 day(s).   The patient does have a current PCP (Ernestene Kiel MD) and [facility].  The patient does have transportation  limitations that hinder transportation to clinic appointments.  Signed: KLoleta Chance MD 11/23/2014, 4:17 PM

## 2014-11-23 NOTE — ED Notes (Signed)
Attempted to call report to floor.  Inpatient RN unavailable and will call back.

## 2014-11-23 NOTE — ED Notes (Addendum)
Received printed results for i-stat chem 8 from [facility]lab.  Shared results with [facility] MD who reports pt can go to [facility].  BUN-19 and Crea-1.0.

## 2014-11-23 NOTE — ED Notes (Signed)
C/o SOB, weakness, fatigue X3 weeks, sts WBC 1.4 per PCP, no pain, A/O X4 and in NAD

## 2014-11-24 ENCOUNTER — Encounter (HOSPITAL_COMMUNITY): Payer: Self-pay | Admitting: *Deleted

## 2014-11-24 DIAGNOSIS — I2699 Other pulmonary embolism without acute cor pulmonale: Secondary | ICD-10-CM

## 2014-11-24 DIAGNOSIS — J40 Bronchitis, not specified as acute or chronic: Secondary | ICD-10-CM

## 2014-11-24 LAB — CBC WITH DIFFERENTIAL/PLATELET
BASOS ABS: 0.1 10*3/uL (ref 0.0–0.1)
BASOS PCT: 2 %
EOS ABS: 0.1 10*3/uL (ref 0.0–0.7)
EOS PCT: 3 %
HCT: 29.8 % — ABNORMAL LOW (ref 36.0–46.0)
HEMOGLOBIN: 10.1 g/dL — AB (ref 12.0–15.0)
LYMPHS ABS: 0.5 10*3/uL — AB (ref 0.7–4.0)
Lymphocytes Relative: 15 %
MCH: 32.7 pg (ref 26.0–34.0)
MCHC: 33.9 g/dL (ref 30.0–36.0)
MCV: 96.4 fL (ref 78.0–100.0)
MONOS PCT: 34 %
Monocytes Absolute: 1.2 10*3/uL — ABNORMAL HIGH (ref 0.1–1.0)
Neutro Abs: 1.5 10*3/uL — ABNORMAL LOW (ref 1.7–7.7)
Neutrophils Relative %: 46 %
PLATELETS: 360 10*3/uL (ref 150–400)
RBC: 3.09 MIL/uL — AB (ref 3.87–5.11)
RDW: 14.6 % (ref 11.5–15.5)
WBC: 3.4 10*3/uL — ABNORMAL LOW (ref 4.0–10.5)

## 2014-11-24 LAB — HEPARIN LEVEL (UNFRACTIONATED)
HEPARIN UNFRACTIONATED: 0.61 [IU]/mL (ref 0.30–0.70)
Heparin Unfractionated: 0.85 IU/mL — ABNORMAL HIGH (ref 0.30–0.70)

## 2014-11-24 LAB — BASIC METABOLIC PANEL
Anion gap: 7 (ref 5–15)
BUN: 13 mg/dL (ref 6–20)
CHLORIDE: 103 mmol/L (ref 101–111)
CO2: 22 mmol/L (ref 22–32)
CREATININE: 0.95 mg/dL (ref 0.44–1.00)
Calcium: 8.4 mg/dL — ABNORMAL LOW (ref 8.9–10.3)
GFR calc Af Amer: >60 mL/min (ref >60)
GFR calc non Af Amer: 59 mL/min — ABNORMAL LOW (ref >60)
GLUCOSE: 112 mg/dL — AB (ref 65–99)
Potassium: 4.4 mmol/L (ref 3.5–5.1)
Sodium: 132 mmol/L — ABNORMAL LOW (ref 135–145)

## 2014-11-24 LAB — URINE CULTURE

## 2014-11-24 LAB — HIV ANTIBODY (ROUTINE TESTING W REFLEX): HIV Screen 4th Generation wRfx: NONREACTIVE

## 2014-11-24 MED ORDER — RIVAROXABAN 20 MG PO TABS: 20.0000 mg | ORAL_TABLET | Freq: Every day | ORAL | Status: DC

## 2014-11-24 MED ORDER — RIVAROXABAN 15 MG PO TABS
15.0000 mg | ORAL_TABLET | Freq: Two times a day (BID) | ORAL | Status: DC
Start: 2014-11-24 — End: 2015-04-13

## 2014-11-24 MED ORDER — HEPARIN (PORCINE) IN NACL 100-0.45 UNIT/ML-% IJ SOLN
1000.0000 [IU]/h | INTRAMUSCULAR | Status: AC
  Administered 2014-11-24: 1000 [IU]/h via INTRAVENOUS

## 2014-11-24 MED ORDER — RIVAROXABAN 15 MG PO TABS: 15.0000 mg | ORAL_TABLET | Freq: Two times a day (BID) | ORAL | Status: DC

## 2014-11-24 MED ORDER — AZITHROMYCIN 250 MG PO TABS: ORAL_TABLET | ORAL | Status: AC

## 2014-11-24 MED ORDER — RIVAROXABAN 20 MG PO TABS
20.0000 mg | ORAL_TABLET | Freq: Every day | ORAL | Status: DC
Start: 2014-12-15 — End: 2014-11-24

## 2014-11-24 MED ORDER — RIVAROXABAN 15 MG PO TABS
15.0000 mg | ORAL_TABLET | Freq: Two times a day (BID) | ORAL | Status: DC
  Administered 2014-11-24: 15 mg via ORAL
  Filled 2014-11-24: qty 1

## 2014-11-24 MED ORDER — AZITHROMYCIN 250 MG PO TABS
250.0000 mg | ORAL_TABLET | Freq: Every day | ORAL | Status: DC
  Administered 2014-11-24: 250 mg via ORAL
  Filled 2014-11-24: qty 1

## 2014-11-24 NOTE — Progress Notes (Signed)
ANTICOAGULATION CONSULT NOTE - Follow Up Consult  Pharmacy Consult for heparin Indication: pulmonary embolus   Labs:  Recent Labs  11/23/14 1230 11/23/14 1614 11/24/14 0029  HGB 12.2  --  10.1*  HCT 36.8  --  29.8*  PLT 435*  --  360  HEPARINUNFRC  --   --  0.85*  CREATININE  --  0.97 0.95     Assessment: 69yo female supratherapeutic on heparin with initial dosing for PE; Hgb down, RN reports no signs of bleeding.  Goal of Therapy:  Heparin level 0.3-0.7 units/ml   Plan:  Will decrease heparin gtt by 1-2 units/kg/hr to 1000 units/hr and check level in 6-8hr.  Wynona Neat, PharmD, BCPS  11/24/2014,1:12 AM

## 2014-11-24 NOTE — Progress Notes (Signed)
Utilization review completed.  

## 2014-11-24 NOTE — Progress Notes (Signed)
ANTICOAGULATION CONSULT NOTE - Follow Up Consult  Pharmacy Consult for heparin Indication: pulmonary embolus   Labs:  Recent Labs  11/23/14 1230 11/23/14 1614 11/24/14 0029 11/24/14 0805  HGB 12.2  --  10.1*  --   HCT 36.8  --  29.8*  --   PLT 435*  --  360  --   HEPARINUNFRC  --   --  0.85* 0.61  CREATININE  --  0.97 0.95  --      Assessment: 69yo female with PE on CTa in RLL. No anticoag pta. Pharmacy consulted to dose heparin. HL now therapeutic x1 on 1000 units/h. No bleeding/IV line issues noted.  Goal of Therapy:  Heparin level 0.3-0.7 units/ml   Plan:  Heparin infusion at 1000 units/hr 8h HL to confirm Daily HL/CBC Mon s/sx bleeding  Desiree [name], PharmD Clinical Pharmacist Pager [phone] 11/24/2014 9:05 AM

## 2014-11-24 NOTE — Discharge Instructions (Addendum)
Pulmonary Embolism A pulmonary embolism (PE) is a sudden blockage or decrease of blood flow in one lung or both lungs. Most blockages come from a blood clot that travels from the legs or the pelvis to the lungs. PE is a dangerous and potentially life-threatening condition if it is not treated right away. CAUSES A pulmonary embolism occurs most commonly when a blood clot travels from one of your veins to your lungs. Rarely, PE is caused by air, fat, amniotic fluid, or part of a tumor traveling through your veins to your lungs. RISK FACTORS A PE is more likely to develop in:  People who smoke.  People who areolder, especially over 30 years of age.  People who are overweight (obese).  People who sit or lie still for a long time, such as during long-distance travel (over 4 hours), bed rest, hospitalization, or during recovery from certain medical conditions like a stroke.  People who do not engage in much physical activity (sedentary lifestyle).  People who have chronic breathing disorders.  People whohave a personal or family history of blood clots or blood clotting disease.  People whohave peripheral vascular disease (PVD), diabetes, or some types of cancer.  People who haveheart disease, especially if the person had a recent heart attack or has congestive heart failure.  People who have neurological diseases that affect the legs (leg paresis).  People who have had a traumatic injury, such as breaking a hip or leg.  People whohave recently had major or lengthy surgery, especially on the hip, knee, or abdomen.  People who have hada central line placed inside a large vein.  People who takemedicines that contain the hormone estrogen. These include birth control pills and hormone replacement therapy.  Pregnancy or during childbirth or the postpartum period. SIGNS AND SYMPTOMS  The symptoms of a PE usually start suddenly and include:  Shortness of breath while active or at  rest.  Coughing or coughing up blood or blood-tinged mucus.  Chest pain that is often worse with deep breaths.  Rapid or irregular heartbeat.  Feeling light-headed or dizzy.  Fainting.  Feelinganxious.  Sweating. There may also be pain and swelling in a leg if that is where the blood clot started. These symptoms may represent a serious problem that is an emergency. Do not wait to see if the symptoms will go away. Get medical help right away. Call your local emergency services (911 in the [place] Do not drive yourself to the hospital. DIAGNOSIS Your health care provider will take a medical history and perform a physical exam. You may also have other tests, including:  Blood tests to assess the clotting properties of your blood, assess oxygen levels in your blood, and find blood clots.  Imaging tests, such as CT, ultrasound, MRI, X-ray, and other tests to see if you have clots anywhere in your body.  An electrocardiogram (ECG) to look for heart strain from blood clots in the lungs. TREATMENT The main goals of PE treatment are:  To stop a blood clot from growing larger.  To stop new blood clots from forming. The type of treatment that you receive depends on many factors, such as the cause of your PE, your risk for bleeding or developing more clots, and other medical conditions that you have. Sometimes, a combination of treatments is necessary. This condition may be treated with:  Medicines, including newer oral blood thinners (anticoagulants), warfarin, low molecular weight heparins, thrombolytics, or heparins.  Wearing compression stockings or using different types  of devices.  Surgery (rare) to remove the blood clot or to place a filter in your abdomen to stop the blood clot from traveling to your lungs. Treatments for a PE are often divided into immediate treatment, long-term treatment (up to 3 months after PE), and extended treatment (more than 3 months after PE). Your  treatment may continue for several months. This is called maintenance therapy, and it is used to prevent the forming of new blood clots. You can work with your health care provider to choose the treatment program that is best for you. What are anticoagulants? Anticoagulants are medicines that treat PEs. They can stop current blood clots from growing and stop new clots from forming. They cannot dissolve existing clots. Your body dissolves clots by itself over time. Anticoagulants are given by mouth, by injection, or through an IV tube. What are thrombolytics? Thrombolytics are clot-dissolving medicines that are used to dissolve a PE. They carry a high risk of bleeding, so they tend to be used only in severe cases or if you have very low blood pressure. HOME CARE INSTRUCTIONS If you are taking a newer oral anticoagulant:  Take the medicine every single day at the same time each day.  Understand what foods and drugs interact with this medicine.  Understand that there are no regular blood tests required when using this medicine.  Understandthe side effects of this medicine, including excessive bruising or bleeding. Ask your health care provider or pharmacist about other possible side effects. If you are taking warfarin:  Understand how to take warfarin and know which foods can affect how warfarin works in Veterinary surgeon.  Understand that it is dangerous to taketoo much or too little warfarin. Too much warfarin increases the risk of bleeding. Too little warfarin continues to allow the risk for blood clots.  Follow your PT and INR blood testing schedule. The PT and INR results allow your health care provider to adjust your dose of warfarin. It is very important that you have your PT and INR tested as often as told by your health care provider.  Avoid major changes in your diet, or tell your health care provider before you change your diet. Arrange a visit with a registered dietitian to answer your  questions. Many foods, especially foods that are high in vitamin K, can interfere with warfarin and affect the PT and INR results. Eat a consistent amount of foods that are high in vitamin K, such as:  Spinach, kale, broccoli, cabbage, collard greens, turnip greens, Brussels sprouts, peas, cauliflower, seaweed, and parsley.  Beef liver and pork liver.  Green tea.  Soybean oil.  Tell your health care provider about any and all medicines, vitamins, and supplements that you take, including aspirin and other over-the-counter anti-inflammatory medicines. Be especially cautious with aspirin and anti-inflammatory medicines. Do not take those before you ask your health care provider if it is safe to do so. This is important because many medicines can interfere with warfarin and affect the PT and INR results.  Do not start or stop taking any over-the-counter or prescription medicine unless your health care provider or pharmacist tells you to do so. If you take warfarin, you will also need to do these things:  Hold pressure over cuts for longer than usual.  Tell your dentist and other health care providers that you are taking warfarin before you have any procedures in which bleeding may occur.  Avoid alcohol or drink very small amounts. Tell your health care provider  if you change your alcohol intake.  Do not use tobacco products, including cigarettes, chewing tobacco, and e-cigarettes. If you need help quitting, ask your health care provider.  Avoid contact sports. General Instructions  Take over-the-counter and prescription medicines only as told by your health care provider. Anticoagulant medicines can have side effects, including easy bruising and difficulty stopping bleeding. If you are prescribed an anticoagulant, you will also need to do these things:  Hold pressure over cuts for longer than usual.  Tell your dentist and other health care providers that you are taking anticoagulants  before you have any procedures in which bleeding may occur.  Avoid contact sports.  Wear a medical alert bracelet or carry a medical alert card that says you have had a PE.  Ask your health care provider how soon you can go back to your normal activities. Stay active to prevent new blood clots from forming.  Make sure to exercise while traveling or when you have been sitting or standing for a long period of time. It is very important to exercise. Exercise your legs by walking or by tightening and relaxing your leg muscles often. Take frequent walks.  Wear compression stockings as told by your health care provider to help prevent more blood clots from forming.  Do not use tobacco products, including cigarettes, chewing tobacco, and e-cigarettes. If you need help quitting, ask your health care provider.  Keep all follow-up appointments with your health care provider. This is important. PREVENTION Take these actions to decrease your risk of developing another PE:  Exercise regularly. For at least 30 minutes every day, engage in:  Activity that involves moving your arms and legs.  Activity that encourages good blood flow through your body by increasing your heart rate.  Exercise your arms and legs every hour during long-distance travel (over 4 hours). Drink plenty of water and avoid drinking alcohol while traveling.  Avoid sitting or lying in bed for long periods of time without moving your legs.  Maintain a weight that is appropriate for your height. Ask your health care provider what weight is healthy for you.  If you are a woman who is over 58 years of age, avoid unnecessary use of medicines that contain estrogen. These include birth control pills.  Do not smoke, especially if you take estrogen medicines. If you need help quitting, ask your health care provider.  If you are at very high risk for PE, wear compression stockings.  If you recently had a PE, have regularly scheduled  ultrasound testing on your legs to check for new blood clots. If you are hospitalized, prevention measures may include:  Early walking after surgery, as soon as your health care provider says that it is safe.  Receiving anticoagulants to prevent blood clots. If you cannot take anticoagulants, other options may be available, such as wearing compression stockings or using different types of devices. SEEK IMMEDIATE MEDICAL CARE IF:  You have new or increased pain, swelling, or redness in an arm or leg.  You have numbness or tingling in an arm or leg.  You have shortness of breath while active or at rest.  You have chest pain.  You have a rapid or irregular heartbeat.  You feel light-headed or dizzy.  You cough up blood.  You notice blood in your vomit, bowel movement, or urine.  You have a fever. These symptoms may represent a serious problem that is an emergency. Do not wait to see if the symptoms will  go away. Get medical help right away. Call your local emergency services (911 in the [place] Do not drive yourself to the hospital.   This information is not intended to replace advice given to you by your health care provider. Make sure you discuss any questions you have with your health care provider.   Document Released: 01/18/2000 Document Revised: 10/11/2014 Document Reviewed: 05/17/2014 Elsevier Interactive Patient Education 2016 [employer]  Rivaroxaban oral tablets What is this medicine? RIVAROXABAN (ri va ROX a ban) is an anticoagulant (blood thinner). It is used to treat blood clots in the lungs or in the veins. It is also used after knee or hip surgeries to prevent blood clots. It is also used to lower the chance of stroke in people with a medical condition called atrial fibrillation. This medicine may be used for other purposes; ask your health care provider or pharmacist if you have questions. What should I tell my health care provider before I take this medicine? They  need to know if you have any of these conditions: -bleeding disorders -bleeding in the brain -blood in your stools (black or tarry stools) or if you have blood in your vomit -history of stomach bleeding -kidney disease -liver disease -low blood counts, like low white cell, platelet, or red cell counts -recent or planned spinal or epidural procedure -take medicines that treat or prevent blood clots -an unusual or allergic reaction to rivaroxaban, other medicines, foods, dyes, or preservatives -pregnant or trying to get pregnant -breast-feeding How should I use this medicine? Take this medicine by mouth with a glass of water. Follow the directions on the prescription label. Take your medicine at regular intervals. Do not take it more often than directed. Do not stop taking except on your doctor's advice. Stopping this medicine may increase your risk of a blood clot. Be sure to refill your prescription before you run out of medicine. If you are taking this medicine after hip or knee replacement surgery, take it with or without food. If you are taking this medicine for atrial fibrillation, take it with your evening meal. If you are taking this medicine to treat blood clots, take it with food at the same time each day. If you are unable to swallow your tablet, you may crush the tablet and mix it in applesauce. Then, immediately eat the applesauce. You should eat more food right after you eat the applesauce containing the crushed tablet. Talk to your pediatrician regarding the use of this medicine in children. Special care may be needed. Overdosage: If you think you have taken too much of this medicine contact a poison control center or emergency room at once. NOTE: This medicine is only for you. Do not share this medicine with others. What if I miss a dose? If you take your medicine once a day and miss a dose, take the missed dose as soon as you remember. If you take your medicine twice a day and miss  a dose, take the missed dose immediately. In this instance, 2 tablets may be taken at the same time. The next day you should take 1 tablet twice a day as directed. What may interact with this medicine? -aspirin and aspirin-like medicines -certain antibiotics like erythromycin, azithromycin, and clarithromycin -certain medicines for fungal infections like ketoconazole and itraconazole -certain medicines for irregular heart beat like amiodarone, quinidine, dronedarone -certain medicines for seizures like carbamazepine, phenytoin -certain medicines that treat or prevent blood clots like warfarin, enoxaparin, and dalteparin -conivaptan -diltiazem -felodipine -  indinavir -lopinavir; ritonavir -NSAIDS, medicines for pain and inflammation, like ibuprofen or naproxen -ranolazine -rifampin -ritonavir -St. John's wort -verapamil This list may not describe all possible interactions. Give your health care provider a list of all the medicines, herbs, non-prescription drugs, or dietary supplements you use. Also tell them if you smoke, drink alcohol, or use illegal drugs. Some items may interact with your medicine. What should I watch for while using this medicine? Visit your doctor or health care professional for regular checks on your progress. Your condition will be monitored carefully while you are receiving this medicine. Notify your doctor or health care professional and seek emergency treatment if you develop breathing problems; changes in vision; chest pain; severe, sudden headache; pain, swelling, warmth in the leg; trouble speaking; sudden numbness or weakness of the face, arm, or leg. These can be signs that your condition has gotten worse. If you are going to have surgery, tell your doctor or health care professional that you are taking this medicine. Tell your health care professional that you use this medicine before you have a spinal or epidural procedure. Sometimes people who take this  medicine have bleeding problems around the spine when they have a spinal or epidural procedure. This bleeding is very rare. If you have a spinal or epidural procedure while on this medicine, call your health care professional immediately if you have back pain, numbness or tingling (especially in your legs and feet), muscle weakness, paralysis, or loss of bladder or bowel control. Avoid sports and activities that might cause injury while you are using this medicine. Severe falls or injuries can cause unseen bleeding. Be careful when using sharp tools or knives. Consider using an Copy. Take special care brushing or flossing your teeth. Report any injuries, bruising, or red spots on the skin to your doctor or health care professional. What side effects may I notice from receiving this medicine? Side effects that you should report to your doctor or health care professional as soon as possible: -allergic reactions like skin rash, itching or hives, swelling of the face, lips, or tongue -back pain -redness, blistering, peeling or loosening of the skin, including inside the mouth -signs and symptoms of bleeding such as bloody or black, tarry stools; red or dark-brown urine; spitting up blood or brown material that looks like coffee grounds; red spots on the skin; unusual bruising or bleeding from the eye, gums, or nose Side effects that usually do not require medical attention (Report these to your doctor or health care professional if they continue or are bothersome.): -dizziness -muscle pain This list may not describe all possible side effects. Call your doctor for medical advice about side effects. You may report side effects to FDA at 1-800-FDA-1088. Where should I keep my medicine? Keep out of the reach of children. Store at room temperature between 15 and 30 degrees C (59 and 86 degrees F). Throw away any unused medicine after the expiration date. NOTE: This sheet is a summary. It may not  cover all possible information. If you have questions about this medicine, talk to your doctor, pharmacist, or health care provider.    2016, Elsevier/[employer]. (2014-01-18 12:45:34)  Information on my medicine - XARELTO (rivaroxaban)  This medication education was reviewed with me or my healthcare representative as part of my discharge preparation.  The pharmacist that spoke with me during my hospital stay was:  Romona Curls, [place]? Xarelto was prescribed to treat  blood clots that may have been found in the veins of your legs (deep vein thrombosis) or in your lungs (pulmonary embolism) and to reduce the risk of them occurring again.  What do you need to know about Xarelto? The starting dose is one 15 mg tablet taken TWICE daily with food for the FIRST 21 DAYS then on (enter date)  12/15/14  the dose is changed to one 20 mg tablet taken ONCE A DAY with your evening meal.  DO NOT stop taking Xarelto without talking to the health care provider who prescribed the medication.  Refill your prescription for 20 mg tablets before you run out.  After discharge, you should have regular check-up appointments with your healthcare provider that is prescribing your Xarelto.  In the future your dose may need to be changed if your kidney function changes by a significant amount.  What do you do if you miss a dose? If you are taking Xarelto TWICE DAILY and you miss a dose, take it as soon as you remember. You may take two 15 mg tablets (total 30 mg) at the same time then resume your regularly scheduled 15 mg twice daily the next day.  If you are taking Xarelto ONCE DAILY and you miss a dose, take it as soon as you remember on the same day then continue your regularly scheduled once daily regimen the next day. Do not take two doses of Xarelto at the same time.   Important Safety Information Xarelto is a blood thinner medicine that can cause bleeding. You  should call your healthcare provider right away if you experience any of the following: ? Bleeding from an injury or your nose that does not stop. ? Unusual colored urine (red or dark brown) or unusual colored stools (red or black). ? Unusual bruising for unknown reasons. ? A serious fall or if you hit your head (even if there is no bleeding).  Some medicines may interact with Xarelto and might increase your risk of bleeding while on Xarelto. To help avoid this, consult your healthcare provider or pharmacist prior to using any new prescription or non-prescription medications, including herbals, vitamins, non-steroidal anti-inflammatory drugs (NSAIDs) and supplements.  This website has more information on Xarelto: [email or web address]

## 2014-11-24 NOTE — Progress Notes (Signed)
Patient ID: Desiree [name], female   DOB: [birth year], 69 y.o.   MRN: [id number]   Subjective: Desiree [name] was feeling well this morning; she had a little discomfort at her IV site but she was breathing well and her cough was getting a bit better. She was ready to go home and has an appointment with Oncology on Monday.  Objective: Vital signs in last 24 hours: Filed Vitals:   11/23/14 1800 11/23/14 1951 11/24/14 0340 11/24/14 0911  BP: 141/77 102/50 145/66   Pulse: 108 80 79   Temp:  98.6 F (37 C) 98.2 F (36.8 C)   TempSrc:  Oral Oral   Resp:  16 18   Height:      Weight:      SpO2:  93% 95% 94%   Weight change:   Intake/Output Summary (Last 24 hours) at 11/24/14 1108 Last data filed at 11/24/14 0959  Gross per 24 hour  Intake 861.45 ml  Output    450 ml  Net 411.45 ml    General: Resting in bed comfortably HEENT: No scleral icterus. EOMI.  Cardiac: Regular rate and rhythm, without murmurs Pulmonary: Breathing well, with late end expiratory wheezes loudest in upper fields, otherwise clear Abdomen: Soft and non-distended, with normal bowel sounds, non-tender to palpation Extremities: Warm and well perfused, without pedal edema or signs of DVT Lymph: No cervical lymphadenopathy Skin: She has 5 poorly-healing punch biopsy sites on her right back, with overlying post-inflammatory hyperpigmented macules in a Christmas tree pattern following her skin lines. Neuro: Alert and oriented X3, cranial nerves II-XII grossly intact  Lab Results: Basic Metabolic Panel:  Recent Labs Lab 11/23/14 1614 11/24/14 0029  NA 133* 132*  K 4.8 4.4  CL 103 103  CO2 19* 22  GLUCOSE 130* 112*  BUN 11 13  CREATININE 0.97 0.95  CALCIUM 8.3* 8.4*   CBC:  Recent Labs Lab 11/23/14 1230 11/24/14 0029  WBC 4.7 3.4*  NEUTROABS 2.8 1.5*  HGB 12.2 10.1*  HCT 36.8 29.8*  MCV 97.4 96.4  PLT 435* 360   Thyroid Function Tests:  Recent Labs Lab 11/23/14 1930  TSH 0.420   Medications: I have  reviewed the patient's current medications. Scheduled Meds: . amLODipine  5 mg Oral Daily  . azithromycin  250 mg Oral Daily  . fluticasone  2 spray Each Nare Daily  . Fluticasone Furoate-Vilanterol  1 Inhaler Inhalation BID  . folic acid  1 mg Oral Daily  . guaiFENesin  600 mg Oral BID  . irbesartan  300 mg Oral Daily  . multivitamin with minerals  1 tablet Oral Daily  . predniSONE  5 mg Oral Q breakfast  . Rivaroxaban  15 mg Oral BID WC  . [START ON 12/15/2014] rivaroxaban  20 mg Oral Q supper  . sertraline  100 mg Oral Daily  . sodium chloride  3 mL Intravenous Q12H  . thiamine  100 mg Oral Daily   Or  . thiamine  100 mg Intravenous Daily  . tiotropium  18 mcg Inhalation Daily   Continuous Infusions: . heparin 1,000 Units/hr (11/24/14 1000)   PRN Meds:.acetaminophen **OR** acetaminophen, levalbuterol, loratadine, LORazepam **OR** LORazepam   Assessment/Plan:   Single subsegmental pulmonary embolism: She denies any dyspnea, she saturated 95% on room air overnight, and her tachycardia resolved. 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 -Continue azithromycin (stop date 10/24) -Continue home COPD meds upon discharge  Leukopenia: White count came down overnight. All other counts down after getting fluids overnight as well so I suspect this was dilutional -Follow-up with Oncology on Monday  Alcohol abuse: CIWAs normal overnight.  Solitary pulmonary nodule: The chest CT showed a 69m SPN in the right lower lobe; recommended follow-up is another chest CT in 3-6 months. She has an extensive smoking history and has rheumatoid arthritis on immunosuppressants, so malignancy, rheumatoid nodules, and infectious causes are at the top of the differential. -Repeat  chest CT in 3-6 months  Rheumatoid arthritis: Appears to be well-controlled on prednisone. -Continue prednisone '5mg'$  daily  Hypertension: Pressures well-controlled. -Continue irbesartan and amlodipine  Dispo: Discharge to home today.  The patient does have a current PCP (Ernestene Kiel MD) and [facility].  The patient does not have transportation limitations that hinder transportation to clinic appointments.  .Services Needed at time of discharge: Y = Yes, Blank = No PT:   OT:   RN:   Equipment:   Other:     LOS: 1 day   KLoleta Chance MD 11/24/2014, 11:08 AM

## 2014-11-24 NOTE — Care Management Note (Signed)
Case Management Note Marvetta Gibbons RN, BSN Unit 2W-Case Manager [phone]  Patient Details  Name: Gerrica Cygan MRN: [id number] Date of Birth: December 13, 1945  Subjective/Objective: Pt admitted with PE                  Action/Plan: PTA pt lived at home- consulted regarding Xarelto benefits- per benefit check with insurance- XERALTO $40.00 CO-PAY PER 30 DAYS  PRE-AUTH REQUIRED @ [phone] OPT.1  PHARMACIES: [place] PH# [phone]. --- pt given info on benefits- and given 30 day free card- call made to pt's pharmacy [facility] in [place]- spoke with stephanie and they do have drug in stock.   Expected Discharge Date:  11/24/14               Expected Discharge Plan:  Home/Self Care  In-House Referral:     Discharge planning Services  CM Consult, Medication Assistance  Post Acute Care Choice:    Choice offered to:     DME Arranged:  N/A DME Agency:  NA  HH Arranged:  NA HH Agency:  NA  Status of Service:  Completed, signed off  Medicare Important Message Given:    Date Medicare IM Given:    Medicare IM give by:    Date Additional Medicare IM Given:    Additional Medicare Important Message give by:     If discussed at [place] of Stay Meetings, dates discussed:    Additional Comments:  [place], [place], RN 11/24/2014, 12:12 PM

## 2014-11-24 NOTE — Progress Notes (Signed)
Discharge instructions given. Pt verbalized understanding and all questions were answered.  

## 2014-11-24 NOTE — Progress Notes (Signed)
  Date: 11/24/2014  Patient name: Desiree [name]  Medical record number: [id number]  Date of birth: 03-04-1945   I have seen and evaluated Desiree [name] and discussed their care with the Residency Team.  Briefly, Desiree [name] is a 69 yo woman with PMH of COPD stage 2, RA on prednisone, HTN who presents for a 3 weeks history of dry cough, DOE and malaise.  She reports that 3 weeks ago, while on vacation, she developed what she thought was a URI.  She had increased cough and increased sputum.  She also had decreased energy and was only getting up to walk to the kitchen.  She reports a 10 pound weight loss and decreased appetite.  Over the last three weeks, she has also developed increased dyspnea and SOB with going up stairs.  She has had no leg pain, no swelling, hemoptysis, pleuritic chest pain, h/o cancer.  She has no further URI symptoms.  She was on a long car ride to [place] 3 weeks ago.  She has been seen by many doctors since getting back, including her PCP (noted to have a low WBC), she was sent to a hematologist and then back to her rheumatologist who has recently changed her medications to prednisone '5mg'$  daily.  In the ED, she was tachycardic, normotensive and saturating well on room air.  She drinks 2-4 glasses of wine daily.   Exam:  Gen: Elderly woman in NAD, alert Eyes: No scleral icterus, EOMI HENT: Neck is supple, no pain with movement CVS: Mild tachycardia, regular, no murmur Pulm: CTAB, no wheeze Abd: Soft, NT, ND, +BS Ext: Thin, no edema, no erythema, no rash, no tenderness to palpation Skin: punch biopsy sites noted on back, no rash  Pertinent data Na 133 Cr 0.97 Alb 2.7  WBC 4.7 Hgb 12.2  CXR chest: No acute abnormality  CT chest angio as read by Dr. Pascal Lux IMPRESSION: 1. Single subsegmental pulmonary embolism in the right lower lobe. 2. Emphysema and advanced bronchitis. 3. 7 mm pulmonary nodule the right lower lobe. Recommend 3-6 month follow-up chest CT. Hilar  adenopathy, potentially reactive, can also be re- evaluated at that time.  EKG: Sinus tachycardia, no TWI   Assessment and Plan: I have seen and evaluated the patient as outlined above. I agree with the formulated Assessment and Plan as detailed in the residents' daily note, with the following changes:   1. Subsegmental PE - Heparin Gtt per pharmacy - Change to oral anticoagulation tomorrow - She will likely only need 3 months of anticoagulation - Follow up with PCP for discontinuation of AC  2. 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 Leukopenia - Improved on CBC's here - She is due to see a hematologist - Follow up outpatient  4. ETOH use - CIWA protocol  5. Solitary pulmonary nodule - Repeat CT scanning as noted in Radiology report.   Sid Falcon, MD 10/21/20163:36 PM

## 2014-11-27 ENCOUNTER — Telehealth: Payer: Self-pay | Admitting: *Deleted

## 2014-11-27 NOTE — Telephone Encounter (Signed)
Received faxed documentation that xarelto has been approved through 11/24/2015 under pt's medicare D benefit.  Of note, this is not an IMCpatient and PA was not initiated by myself, but patient was seen on by [facility] teaching service while in the hospital.[place], [place]10/24/20169:52 AM

## 2015-02-04 DIAGNOSIS — I2699 Other pulmonary embolism without acute cor pulmonale: Secondary | ICD-10-CM

## 2015-02-04 HISTORY — DX: Other pulmonary embolism without acute cor pulmonale: I26.99

## 2015-04-11 DIAGNOSIS — D72819 Decreased white blood cell count, unspecified: Secondary | ICD-10-CM | POA: Diagnosis not present

## 2015-04-12 ENCOUNTER — Encounter: Payer: Self-pay | Admitting: Internal Medicine

## 2015-04-13 ENCOUNTER — Encounter: Payer: Self-pay | Admitting: Internal Medicine

## 2015-04-27 ENCOUNTER — Ambulatory Visit: Payer: Self-pay | Admitting: Internal Medicine

## 2015-06-06 ENCOUNTER — Ambulatory Visit: Payer: Self-pay | Admitting: Internal Medicine

## 2015-07-17 DIAGNOSIS — J309 Allergic rhinitis, unspecified: Secondary | ICD-10-CM | POA: Insufficient documentation

## 2015-07-17 HISTORY — DX: Allergic rhinitis, unspecified: J30.9

## 2015-08-01 ENCOUNTER — Ambulatory Visit: Payer: Self-pay | Admitting: Internal Medicine

## 2015-09-25 ENCOUNTER — Ambulatory Visit: Payer: Medicare Other | Admitting: Allergy and Immunology

## 2015-10-02 ENCOUNTER — Ambulatory Visit (INDEPENDENT_AMBULATORY_CARE_PROVIDER_SITE_OTHER): Payer: Medicare Other | Admitting: Allergy

## 2015-10-02 ENCOUNTER — Encounter: Payer: Self-pay | Admitting: Allergy

## 2015-10-02 VITALS — BP 140/82 | HR 88 | Temp 97.9°F | Resp 20 | Ht 61.0 in | Wt 147.2 lb

## 2015-10-02 DIAGNOSIS — L500 Allergic urticaria: Secondary | ICD-10-CM | POA: Diagnosis not present

## 2015-10-02 NOTE — Progress Notes (Signed)
New Patient Note  RE: Tanina Barb MRN: [id number] DOB: 03-04-1945 Date of Office Visit: 10/02/2015  Referring provider: Gildardo Cranker, DO Primary care provider: Charletta Cousin, MD  Chief Complaint: rash  History of present illness: Desiree [name] is a 70 y.o. female presenting today for evaluation of itching and rash.  She states that she is self-referred.  She was seen in our office many years ago for only one visit.  She has been having itching and rash for the past 2 years.  She has been seeing dermatology here in [place].  She has had 2 biopsies which pt reports were c/w with allergic reaction.  Her last dermatologist advised she use triamcinolone cream and to stop going to tanning bed.    Rash she reports is "under the skin" but can flare up where you can see the rash which is red and raised.  Rash is itchy.  She will scratch and then it was turns red.   The itch/rash comes and go and can occur all over the body.  She reports when she gets up in the morning the itch is worse.   She denies any swelling.  She does have joint pain with her RA but denies any worsening joint pain when she does have the rash and itchiness.  The rash does not leave any marks or bruising after it does resolve in a body area.She has been using Triamcinolone but states it is difficult to use she feels constant need to apply to different areas when she has the symptoms.  She also uses Aveeno for eczema lotion.  She has not noticed any triggers that bring on the rash.  She denies any preceding illnesses, no change in her medications or dosage changes, no new foods, no stings, no changes in soaps/detergents/lotions. She did trial MTX with dermatology for this rash but she had leukopenia and was stopped.  She was also treated for scabies empirically.   She does take aspirin 81 mg daily for history of blood coat.  Drinks wine on occasion.    She takes loratidine and singulair daily which she has done for several  years that was prescribed by her pulmonologist.  She does report she is allergic to cats. She doesn't door some nasal congestion and drainage in the mornings.  Her pulmonologist is Dr. Gwenyth Allegra in [place] that she sees every 3 months for COPD with possible asthma overlay.  She takes [employer].    She does take prednisone 87m daily for her RA management as well as hydroxychloroquine.     Review of systems: Review of Systems  Constitutional: Negative for chills, fever and weight loss.  HENT: Positive for congestion. Negative for sore throat.   Eyes: Negative for redness.  Respiratory: Positive for cough and shortness of breath. Negative for wheezing.   Cardiovascular: Negative for palpitations.  Gastrointestinal: Positive for heartburn. Negative for diarrhea, nausea and vomiting.  Musculoskeletal: Positive for joint pain.  Neurological: Negative for headaches.    All other systems negative unless noted above in HPI  Past medical history: Past Medical History:  Diagnosis Date  . Cat allergies   . COPD (chronic obstructive pulmonary disease) ([place]   . Hypertension   . Osteoporosis   . [place]  . Rheumatoid arthritis(714.0)    Dr STobie Lordsfollows    Past surgical history: History reviewed. No pertinent surgical history.  Family history:  Family History  Problem Relation Age of Onset  . Heart  disease Father   . Stroke Mother   . Clotting disorder Mother   . Heart disease Sister   . Sleep apnea Sister   . Emphysema Sister     Social history: Social History   Social History  . Marital status: Widowed   Social History Main Topics  . Smoking status: Former Smoker    Packs/day: 1.00    Years: 40.00    Types: Cigarettes    Quit date: 02/03/2002  . Smokeless tobacco: Never Used  . Alcohol use 1.0 oz/week    2 Standard drinks or equivalent per week  . Drug use: No   Social History Narrative  She lives in a home with carpeting with electric heating and  central cooling. There are no pets in the home. She is retired.  Medication List:   Medication List       Accurate as of 10/02/15  3:55 PM. Always use your most recent med list.          amLODipine 5 MG tablet Commonly known as:  NORVASC Take 5 mg by mouth daily.   ASPIRIN PO Take by mouth.   BREO ELLIPTA 100-25 MCG/INH Aepb Generic drug:  fluticasone furoate-vilanterol Inhale 1 Inhaler into the lungs 2 (two) times daily.   hydroxychloroquine 200 MG tablet Commonly known as:  PLAQUENIL TAKE ONE TABLET BY MOUTH WITH FOOD OR MILK TWICE DAILY   irbesartan 300 MG tablet Commonly known as:  AVAPRO Take 300 mg by mouth daily.   leflunomide 20 MG tablet Commonly known as:  ARAVA Take 20 mg by mouth daily.   loratadine 10 MG tablet Commonly known as:  CLARITIN Take 10 mg by mouth daily as needed.   montelukast 10 MG tablet Commonly known as:  SINGULAIR TAKE ONE TABLET BY MOUTH NIGHTLY FOR 30 DAYS   omeprazole 20 MG capsule Commonly known as:  PRILOSEC Take 20 mg by mouth.   predniSONE 5 MG tablet Commonly known as:  DELTASONE Take 5 mg by mouth daily with breakfast.   PROAIR HFA 108 (90 Base) MCG/ACT inhaler Generic drug:  albuterol Inhale 2 puffs into the lungs every 4 (four) hours as needed for wheezing or shortness of breath.   sertraline 100 MG tablet Commonly known as:  ZOLOFT Take 1 tablet by mouth daily.   VITAMIN B-12 PO Take by mouth.   VITAMIN C PO Take by mouth.   VITAMIN D3 PO Take by mouth.       Known medication allergies: No Known Allergies   Physical examination: Blood pressure 140/82, pulse 88, temperature 97.9 F (36.6 C), temperature source Oral, resp. rate 20, height _0  (1.549 m), weight 147 lb 3.2 oz (66.8 kg).  General: Alert, interactive, in no acute distress. HEENT: TMs pearly gray, turbinates minimally edematous without discharge, post-pharynx non erythematous. Neck: Supple without lymphadenopathy. Lungs: Clear to  auscultation without wheezing, rhonchi or rales. {no increased work of breathing. CV: Normal S1, S2 without murmurs. Abdomen: Nondistended, nontender. Skin: no urticarial lesions noted but pt did scratch various body area throughout visit with immediate erythema  at scratch site. Extremities:  No clubbing, cyanosis or edema. Neuro:   Grossly intact.  Diagnositics/Labs:  Reviewed most recent skin biopsy from September 2016 which showed focal epidermal necrosis consistent with excoriation with superficial perivascular infiltrate of lymphocytes and histiocytes with scattered eosinophils. 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 The PAS stain was negative for fungal organisms. A skin biopsy from August 2016 was consistent with an urticarial  allergic reaction or other form of hypersensitivity.  Labs: Review recent labs done from 09/11/2015 by Dr. Marcello Moores including unremarkable BMP.  CMP from July 2017 showed increased creatinine that was much improved on repeat BMP testing lipid panel was largely unremarkable with a total cholesterol 200.   Labs in the EMR from October 2016 show a largely unremarkable CBC without eosinophilia.   HIV screen was nonreactive.  TSH was normal.    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  - Reviewed medication list and do not feel she is reacting to medication although did discuss aspirin may exacerbate itching however she is receiving a greater benefit from aspirin use for history of blood clot and should continue  - Skin biopsy results are consistent with a hypersensitivity reaction  - Discussed option of patch testing however at this time not a candidate given long-term prednisone use for her RA management  - We will obtain environmental allergen profile via blood work as well as a total IgE level, CBC with differential, TSH,  and ESR  - Advised to stop loratadine and change to either Allegra 180 mg or Zyrtec 10 mg to take twice a day  - Continue Singulair 10 mg at bedtime  - If symptoms are not improved on this regimen consider adding H2 blocker like Zantac  COPD/asthma  - She will continue her follow-up with her pulmonologist in [facility] and will continue on her Breo, albuterol and Singulair  Follow-up in 3-4 months  I appreciate the opportunity to take part in Nollie's care. Please do not hesitate to contact me with questions.  Sincerely,   Prudy Feeler, MD Allergy/Immunology [facility] [place] [facility] [place]

## 2015-10-02 NOTE — Patient Instructions (Signed)
Pruritus (itching) and rash  - May be allergic urticaria (hives)   - Skin biopsy results are consistent with a hypersensitivity reaction  - We will obtain environmental allergen profile will be blood work as well as a total IgE level  - Advised to stop loratadine and change to either Allegra 180 mg or Zyrtec 10 mg to take twice a day  - Continue Singulair 10 mg at that time  - If symptoms are not improved on this regimen consider adding H2 blocker like Zantac  - We will try to obtain recent lab work done by her PCP and review  Follow-up in 3-4 months

## 2015-10-04 LAB — CBC WITH DIFFERENTIAL/PLATELET
Basophils Absolute: 0 10*3/uL (ref 0.0–0.2)
Basos: 1 %
EOS (ABSOLUTE): 0.2 10*3/uL (ref 0.0–0.4)
Eos: 2 %
Hematocrit: 36.7 % (ref 34.0–46.6)
Hemoglobin: 12.1 g/dL (ref 11.1–15.9)
IMMATURE GRANULOCYTES: 0 %
Immature Grans (Abs): 0 10*3/uL (ref 0.0–0.1)
Lymphocytes Absolute: 0.6 10*3/uL — ABNORMAL LOW (ref 0.7–3.1)
Lymphs: 9 %
MCH: 32.1 pg (ref 26.6–33.0)
MCHC: 33 g/dL (ref 31.5–35.7)
MCV: 97 fL (ref 79–97)
MONOS ABS: 0.5 10*3/uL (ref 0.1–0.9)
Monocytes: 8 %
NEUTROS PCT: 80 %
Neutrophils Absolute: 5.1 10*3/uL (ref 1.4–7.0)
PLATELETS: 349 10*3/uL (ref 150–379)
RBC: 3.77 x10E6/uL (ref 3.77–5.28)
RDW: 14 % (ref 12.3–15.4)
WBC: 6.4 10*3/uL (ref 3.4–10.8)

## 2015-10-04 LAB — SEDIMENTATION RATE: SED RATE: 4 mm/h (ref 0–40)

## 2015-10-04 LAB — TSH: TSH: 1.28 u[IU]/mL (ref 0.450–4.500)

## 2015-10-05 ENCOUNTER — Encounter: Payer: Self-pay | Admitting: *Deleted

## 2015-10-06 LAB — ALLERGENS W/TOTAL IGE AREA 2
Cat Dander IgE: 3.91 kU/L — AB
Cladosporium Herbarum IgE: <0.1 kU/L
Common Silver Birch IgE: <0.1 kU/L
Dog Dander IgE: 0.18 kU/L — AB
Elm, American IgE: <0.1 kU/L
IgE (Immunoglobulin E), Serum: 158 IU/mL — ABNORMAL HIGH (ref 0–100)
Johnson Grass IgE: <0.1 kU/L
M003-IGE ASPERGILLUS FUMIGATUS: 0.31 kU/L — AB
Mouse Urine IgE: <0.1 kU/L
Pecan, Hickory IgE: <0.1 kU/L
Penicillium Chrysogen IgE: <0.1 kU/L
White Mulberry IgE: <0.1 kU/L

## 2016-05-16 ENCOUNTER — Ambulatory Visit (INDEPENDENT_AMBULATORY_CARE_PROVIDER_SITE_OTHER): Payer: Medicare Other | Admitting: Pulmonary Disease

## 2016-05-16 ENCOUNTER — Encounter: Payer: Self-pay | Admitting: Pulmonary Disease

## 2016-05-16 VITALS — BP 132/82 | HR 80 | Ht 61.0 in | Wt 130.2 lb

## 2016-05-16 DIAGNOSIS — R918 Other nonspecific abnormal finding of lung field: Secondary | ICD-10-CM

## 2016-05-16 DIAGNOSIS — R911 Solitary pulmonary nodule: Secondary | ICD-10-CM

## 2016-05-16 DIAGNOSIS — J432 Centrilobular emphysema: Secondary | ICD-10-CM | POA: Diagnosis not present

## 2016-05-16 DIAGNOSIS — M810 Age-related osteoporosis without current pathological fracture: Secondary | ICD-10-CM | POA: Insufficient documentation

## 2016-05-16 DIAGNOSIS — J449 Chronic obstructive pulmonary disease, unspecified: Secondary | ICD-10-CM | POA: Diagnosis not present

## 2016-05-16 DIAGNOSIS — IMO0001 Reserved for inherently not codable concepts without codable children: Secondary | ICD-10-CM

## 2016-05-16 HISTORY — DX: Age-related osteoporosis without current pathological fracture: M81.0

## 2016-05-16 HISTORY — DX: Other nonspecific abnormal finding of lung field: R91.8

## 2016-05-16 HISTORY — DX: Centrilobular emphysema: J43.2

## 2016-05-16 MED ORDER — FLUTICASONE FUROATE-VILANTEROL 100-25 MCG/INH IN AEPB: 1.0000 | INHALATION_SPRAY | Freq: Every day | RESPIRATORY_TRACT | 0 refills | Status: DC

## 2016-05-16 NOTE — Progress Notes (Signed)
Subjective:    Patient ID: Desiree [name], female    DOB: 01/03/1946, 72 y.o.   MRN: [id number]  HPI The patient reports she has been followed by a pulmonologist for her COPD but wanted to switch to a different provider. She just completed pulmonary rehab at [facility]. She reports her endurance has improved significantly. She reports coughing only with a mild post-nasal drainage. She denies any audible wheezing. She denies any recent bronchitis but did have it frequently. Denies any nocturnal awakenings with any coughing or wheezing. She reports she is compliant with Breo. She uses her rescue inhaler rarely. She denies any seasonal sinus congestion or allergies. She is aware that she is allergic to cats. No chest pain, tightness, or pressure. She had spontaneous PE in 2016 and was treated with a course of anticoagulation. She has been off anticoagulation for some time. She was also noted to have a 78m right lower lobe nodule but had no change on repeat CT imaging in March 2017. No fever, chills, or sweats. No abdominal pain, nausea or emesis. She does have Rheumatoid Arthritis in "remission". She is on chronic Prednisone daily. She follows with Dr. HTrudie Reed No new joint pain or swelling.   Review of Systems  Skin: Rash:  itching >> seen by multiple dertmatologist/allergist -- dermititis?  No dysuria or hematuria. A pertinent 14 point review of systems is negative except as per the history of presenting illness.  No Known Allergies  Current Outpatient Prescriptions on File Prior to Visit  Medication Sig Dispense Refill  . albuterol (PROAIR HFA) 108 (90 Base) MCG/ACT inhaler Inhale 2 puffs into the lungs every 4 (four) hours as needed for wheezing or shortness of breath.    .Marland KitchenamLODipine (NORVASC) 5 MG tablet Take 5 mg by mouth daily.    . Ascorbic Acid (VITAMIN C PO) Take by mouth.    . ASPIRIN PO Take by mouth.    . Cholecalciferol (VITAMIN D3 PO) Take by mouth.    . Cyanocobalamin  (VITAMIN B-12 PO) Take by mouth.    . Fluticasone Furoate-Vilanterol (BREO ELLIPTA) 100-25 MCG/INH AEPB Inhale 1 Inhaler into the lungs 2 (two) times daily.    . hydroxychloroquine (PLAQUENIL) 200 MG tablet TAKE ONE TABLET BY MOUTH WITH FOOD OR MILK TWICE DAILY  3  . irbesartan (AVAPRO) 300 MG tablet Take 300 mg by mouth daily.    .Marland Kitchenleflunomide (ARAVA) 20 MG tablet Take 20 mg by mouth daily.    .Marland Kitchenloratadine (CLARITIN) 10 MG tablet Take 10 mg by mouth daily as needed.      . montelukast (SINGULAIR) 10 MG tablet TAKE ONE TABLET BY MOUTH NIGHTLY FOR 30 DAYS  5  . omeprazole (PRILOSEC) 20 MG capsule Take 20 mg by mouth.    . predniSONE (DELTASONE) 5 MG tablet Take 5 mg by mouth daily with breakfast.    . sertraline (ZOLOFT) 100 MG tablet Take 1 tablet by mouth daily.     No current facility-administered medications on file prior to visit.     Past Medical History:  Diagnosis Date  . Cat allergies   . COPD [place] disease) ([place]   . Hypertension   . Osteoporosis   . [place]monary [place]ism ([place]  . Rheumatoid arthritis(714.0)    Dr STobie Lordsfollows    Past Surgical History:  Procedure Laterality Date  . CYSTECTOMY Right    breast    Family History  Problem Relation Age of Onset  .  Heart disease Father   . Stroke Mother   . Clotting disorder Mother   . Heart disease Sister   . Sleep apnea Sister   . Emphysema Sister   . Breast cancer Maternal Aunt   . Emphysema Paternal Grandmother     Social History   Social History  . Marital status: Widowed    Spouse name: N/A  . Number of children: N/A  . Years of education: N/A   Occupational History  . retired    Social History Main Topics  . Smoking status: Former Smoker    Packs/day: 1.00    Years: 40.00    Types: Cigarettes    Quit date: 02/03/2001  . Smokeless tobacco: Never Used  . Alcohol use 1.0 oz/week    2 Standard drinks or equivalent per week     Comment: 2 drinks daily  . Drug use:  No  . Sexual activity: Not Asked   Other Topics Concern  . None   Social History Narrative   Tobacco use, amount per day now: NONE   Past tobacco use, amount per day: 1 PACK PER DAY   How many years did you use tobacco:TOO MANY STOPPED IN 2003   Alcohol use (drinks per week): 14   Diet: GOOD   Do you drink/eat things with caffeine: YES   Marital status: WIDOW                What year were you married? 1967   Do you live in a house, apartment, assisted living, condo, trailer, etc.? HOUSE   Is it one or more stories? SPLIT LEVEL   How many persons live in your home? 1   Do you have pets in your home?( please list) NONE   Current or past profession: [employer]   Do you exercise?  NO                                Type and how often?   Do you have a living will? YES    Do you have a DNR form?  YES                                 If not, do you want to discuss one?   Do you have signed POA/HPOA forms?        ??              If so, please bring to you appointment      [facility] (05/16/16):   Originally from [place], [place]. 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 No pets currently. No bird, mold, or hot tub exposure.          Objective:   Physical Exam BP 132/82 (BP Location: Left Arm, Cuff Size: Normal)   Pulse 80   Ht '5\' 1"'$  (1.549 m)   Wt 130 lb 3.2 oz (59.1 kg)   SpO2 93%   BMI 24.60 kg/m  General:  Awake. Alert. No acute distress.  Integument:  Warm & dry. No rash on exposed skin. No bruising. Extremities:  No cyanosis or clubbing.  Lymphatics:  No appreciated cervical or supraclavicular lymphadenoapthy. HEENT:  Moist mucus membranes. No oral ulcers. No scleral injection or icterus. Minimal to no nasal turbinate swelling. Cardiovascular:  Regular rate. No edema.  No appreciable JVD.  Pulmonary:  Good aeration & clear to auscultation bilaterally. Symmetric chest wall expansion. No accessory muscle  use. Abdomen: Soft. Normal bowel sounds. Nondistended. Grossly nontender. Musculoskeletal:  Normal bulk and tone. Hand grip strength 5/5 bilaterally. No joint deformity or effusion appreciated. Neurological:  CN 2-12 grossly in tact. No meningismus. Moving all 4 extremities equally. Symmetric brachioradialis deep tendon reflexes. Psychiatric:  Mood and affect congruent. Speech normal rhythm, rate & tone.   PFT 09/19/10: FVC 2.46 L (88%) FEV1 1.50 L (75%) FEV1/FVC 0.61 FEF 25-75 0.66 L (28%) negative bronchodilator response TLC 4.88 L (105%) RV 133% ERV 74% DLCO uncorrected 75%  IMAGING CT CHEST W/O 04/20/15 (personally reviewed by me):  Mild apical predominant centrilobular emphysema. Predominantly groundglass 6 mm right lower lobe nodule relatively unchanged. No pathologic mediastinal adenopathy. No pericardial effusion. No pleural effusion or thickening.  LABS 10/03/15 IgE:  158    Assessment & Plan:  71 y.o. female previously diagnosed with COPD and followed by outside pulmonologist. Patient's previous spirometry showed only mild airway obstruction from 2012. Despite her prescribed Singulair and Allegra she does not seem to have much in the way of symptoms from allergies. She admits to sensitivity to cats and plans to use these medications with her upcoming trip. Overall, she seems to significantly improve with regards to her physical limitations and dyspnea on exertion after completing pulmonary rehabilitation. I instructed the patient to notify me if she had any new breathing problems before next appointment and informed her we would not be adjusting her inhaler medications at this time.  1. COPD with centrilobular emphysema: Plan to screen for alpha-1 antitrypsin deficiency at next appointment. Checking full pulmonary function testing at follow-up appointment. Continuing Breo. 2. Right lower lobe nodule: Checking CT shows without contrast since it has been over 1 year. 3. Health maintenance:  Status post Pneumovax October 2012. 4. Follow-up: Patient to return to clinic in 3 months or sooner if needed.  Sonia Baller Ashok Cordia, M.D. [facility] Pulmonary & Critical Care Pager:  [phone] After 3pm or if no response, call [phone] 5:36 PM 05/16/16

## 2016-05-16 NOTE — Patient Instructions (Signed)
   Try stopping your Singulair & Allegra after you get back from your daughter's and see if this has any adverse effect.  We are keeping your inhalers the same.  Call me if you have any questions or concerns.  TESTS ORDERED: 1. Serum Alpha-1 Antitrypsin Phenotype at follow-up 2. Full PFTs at follow-up 3. CT Chest w/o

## 2016-05-22 ENCOUNTER — Encounter: Payer: Self-pay | Admitting: Pulmonary Disease

## 2016-06-05 ENCOUNTER — Telehealth: Payer: Self-pay | Admitting: Pulmonary Disease

## 2016-06-05 DIAGNOSIS — R911 Solitary pulmonary nodule: Secondary | ICD-10-CM

## 2016-06-05 NOTE — Telephone Encounter (Signed)
Received staff message from [facility] stating:      Please contact the patient. Please let her know I reviewed her chest CT scan which does show stability in her right lung nodule but a new left upper lobe nodule. There are findings that suggest there may be an underlying indolent infectious process. We should repeat a chest CT scan in 3 months, just prior to her follow-up with me. We will review the test further at her follow-up. Diagnosis is multiple lung nodules. Thank you.    Pt was contacted, but was unable to be reached. Left voicemail for patient to contact office regarding medical results.

## 2016-06-06 NOTE — Telephone Encounter (Signed)
Left message for patient contact office for medical results.

## 2016-06-19 NOTE — Telephone Encounter (Signed)
Left message for patient to contact office regarding medical results.

## 2016-06-24 NOTE — Telephone Encounter (Signed)
Spoke with patient and made her aware of results and recommendations. Pt agreed to CT and stated she wanted it to be performed at [facility]. She was informed someone will call her to schedule the CT. She verbalized understanding and did not have any additional questions. Order placed for CT chest WO. Nothing further is needed.

## 2016-08-11 ENCOUNTER — Telehealth: Payer: Self-pay | Admitting: Pulmonary Disease

## 2016-08-11 MED ORDER — FLUTICASONE FUROATE-VILANTEROL 100-25 MCG/INH IN AEPB: 1.0000 | INHALATION_SPRAY | Freq: Every day | RESPIRATORY_TRACT | 2 refills | Status: DC

## 2016-08-11 NOTE — Telephone Encounter (Signed)
Spoke with patient. She requested Breo 100 to be called into [facility]. Rx has been sent in to pharmacy. Nothing else needed at time of call.

## 2016-09-03 ENCOUNTER — Ambulatory Visit: Payer: Medicare Other | Admitting: Pulmonary Disease

## 2016-09-03 ENCOUNTER — Ambulatory Visit (INDEPENDENT_AMBULATORY_CARE_PROVIDER_SITE_OTHER)
Admission: RE | Admit: 2016-09-03 | Discharge: 2016-09-03 | Disposition: A | Discharge status: Home / Self Care | Payer: Medicare Other | Source: Ambulatory Visit | Attending: Pulmonary Disease | Admitting: Pulmonary Disease

## 2016-09-03 DIAGNOSIS — R911 Solitary pulmonary nodule: Secondary | ICD-10-CM

## 2016-09-05 ENCOUNTER — Encounter: Payer: Self-pay | Admitting: Pulmonary Disease

## 2016-09-05 ENCOUNTER — Ambulatory Visit (INDEPENDENT_AMBULATORY_CARE_PROVIDER_SITE_OTHER): Payer: Medicare Other | Admitting: Pulmonary Disease

## 2016-09-05 ENCOUNTER — Other Ambulatory Visit: Payer: Medicare Other

## 2016-09-05 VITALS — BP 128/74 | HR 100 | Ht 61.0 in | Wt 130.0 lb

## 2016-09-05 DIAGNOSIS — J432 Centrilobular emphysema: Secondary | ICD-10-CM

## 2016-09-05 DIAGNOSIS — Z23 Encounter for immunization: Secondary | ICD-10-CM

## 2016-09-05 DIAGNOSIS — J449 Chronic obstructive pulmonary disease, unspecified: Secondary | ICD-10-CM

## 2016-09-05 DIAGNOSIS — R918 Other nonspecific abnormal finding of lung field: Secondary | ICD-10-CM

## 2016-09-05 LAB — PULMONARY FUNCTION TEST
DL/VA % pred: 86 %
DL/VA: 3.83 ml/min/mmHg/L
DLCO COR % PRED: 69 %
DLCO UNC % PRED: 66 %
DLCO UNC: 13.43 ml/min/mmHg
DLCO cor: 14.03 ml/min/mmHg
FEF 25-75 POST: 0.74 L/s
FEF 25-75 PRE: 0.62 L/s
FEF2575-%Change-Post: 18 %
FEF2575-%PRED-POST: 46 %
FEF2575-%Pred-Pre: 39 %
FEV1-%CHANGE-POST: 7 %
FEV1-%PRED-POST: 64 %
FEV1-%Pred-Pre: 59 %
FEV1-Post: 1.21 L
FEV1-Pre: 1.13 L
FEV1FVC-%Change-Post: 5 %
FEV1FVC-%PRED-PRE: 87 %
FEV6-%Change-Post: 0 %
FEV6-%PRED-POST: 70 %
FEV6-%Pred-Pre: 71 %
FEV6-Post: 1.7 L
FEV6-Pre: 1.7 L
FEV6FVC-%CHANGE-POST: -2 %
FEV6FVC-%Pred-Post: 102 %
FEV6FVC-%Pred-Pre: 105 %
FVC-%Change-Post: 2 %
FVC-%Pred-Post: 69 %
FVC-%Pred-Pre: 67 %
FVC-Post: 1.75 L
FVC-Pre: 1.71 L
POST FEV1/FVC RATIO: 69 %
PRE FEV6/FVC RATIO: 100 %
Post FEV6/FVC ratio: 97 %
Pre FEV1/FVC ratio: 66 %
RV % pred: 128 %
RV: 2.7 L
TLC % pred: 103 %
TLC: 4.77 L

## 2016-09-05 MED ORDER — FLUTICASONE-UMECLIDIN-VILANT 100-62.5-25 MCG/INH IN AEPB: 1.0000 | INHALATION_SPRAY | Freq: Every day | RESPIRATORY_TRACT | 5 refills | Status: DC

## 2016-09-05 NOTE — Patient Instructions (Addendum)
   Call me if you have any questions or breathing problems before your next appointment.  Use the Trelegy Ellipta inhaler in place of your Breo. Inhale 1 puff once daily as prescribed.   Remember to remove any dentures or partials you have before you use your inhaler. Remember to brush your teeth & tongue after you use your inhaler as well as rinse, gargle & spit to keep from getting thrush in your mouth or on your tongue (a white film).   We will repeat a CT scan of your chest in February to monitor your lung nodules.   TESTS ORDERED: 1. Serum Alpha-1 Antitrypsin Phenotype today  2. CT Chest w/o February 2019

## 2016-09-05 NOTE — Progress Notes (Signed)
Subjective:    Patient ID: Desiree [name], female    DOB: [birth year], 71 y.o.   MRN: [id number]  C.C.:  Follow-up for Moderate-severe COPD w/ Centrilobular Emphysema & Right Lower Lobe Nodule.  HPI Moderate-severe COPD w/ Centrilobular Emphysema:  Patient was on Breo at last appointment. Spirometry today still demonstrates no significant bronchodilator response but does show significant worsening since previous testing. She reports she hasn't been exercising regularly for the last month. She still has dyspnea on exertion. No coughing or wheezing.   Right Lower Lobe Nodule: Predominantly groundglass measuring 6 mm. Seen on CT imaging March 2017. Previous CT imaging in August 2018 showed multiple bilateral subcentimeter nodules as well as tree-in-bud opacities.  Review of Systems She denies any sinus congestion or pressure. She feels she does have sinus drainage. No chest pain or pressure. No fever or chills.   No Known Allergies  Current Outpatient Prescriptions on File Prior to Visit  Medication Sig Dispense Refill  . albuterol (PROAIR HFA) 108 (90 Base) MCG/ACT inhaler Inhale 2 puffs into the lungs every 4 (four) hours as needed for wheezing or shortness of breath.    Marland Kitchen amLODipine (NORVASC) 5 MG tablet Take 5 mg by mouth daily.    . Ascorbic Acid (VITAMIN C PO) Take by mouth.    . ASPIRIN PO Take by mouth.    . Cholecalciferol (VITAMIN D3 PO) Take by mouth.    . Cyanocobalamin (VITAMIN B-12 PO) Take by mouth.    . fluticasone furoate-vilanterol (BREO ELLIPTA) 100-25 MCG/INH AEPB Inhale 1 puff into the lungs daily. 1 each 2  . hydroxychloroquine (PLAQUENIL) 200 MG tablet TAKE ONE TABLET BY MOUTH WITH FOOD OR MILK TWICE DAILY  3  . irbesartan (AVAPRO) 300 MG tablet Take 300 mg by mouth daily.    Marland Kitchen leflunomide (ARAVA) 20 MG tablet Take 20 mg by mouth daily.    . montelukast (SINGULAIR) 10 MG tablet TAKE ONE TABLET BY MOUTH NIGHTLY FOR 30 DAYS  5  . predniSONE (DELTASONE) 5 MG tablet  Take 5 mg by mouth daily with breakfast.    . sertraline (ZOLOFT) 100 MG tablet Take 1 tablet by mouth daily.    Marland Kitchen omeprazole (PRILOSEC) 20 MG capsule Take 20 mg by mouth.     No current facility-administered medications on file prior to visit.     Past Medical History:  Diagnosis Date  . Cat allergies   . COPD (chronic obstructive pulmonary disease) ([place])   . Hypertension   . Osteoporosis   . Pulmonary embolism ([place]) 2017  . Rheumatoid arthritis(714.0)    Dr Tobie Lords follows    Past Surgical History:  Procedure Laterality Date  . CYSTECTOMY Right    breast    Family History  Problem Relation Age of Onset  . Heart disease Father   . Stroke Mother   . Clotting disorder Mother   . Heart disease Sister   . Sleep apnea Sister   . Emphysema Sister   . Breast cancer Maternal Aunt   . Emphysema Paternal Grandmother     Social History   Social History  . Marital status: Widowed    Spouse name: N/A  . Number of children: N/A  . Years of education: N/A   Occupational History  . retired    Social History Main Topics  . Smoking status: Former Smoker    Packs/day: 1.00    Years: 40.00    Types: Cigarettes    Quit date: 02/03/2001  .  Smokeless tobacco: Never Used  . Alcohol use 1.0 oz/week    2 Standard drinks or equivalent per week     Comment: 2 drinks daily  . Drug use: No  . Sexual activity: Not Asked   Other Topics Concern  . None   Social History Narrative   Tobacco use, amount per day now: NONE   Past tobacco use, amount per day: 1 PACK PER DAY   How many years did you use tobacco:TOO MANY STOPPED IN 2003   Alcohol use (drinks per week): 14   Diet: GOOD   Do you drink/eat things with caffeine: YES   Marital status: WIDOW                What year were you married? 1967   Do you live in a house, apartment, assisted living, condo, trailer, etc.? HOUSE   Is it one or more stories? SPLIT LEVEL   How many persons live in your home? 1   Do you have pets  in your home?( please list) NONE   Current or past profession: [employer]   Do you exercise?  NO                                Type and how often?   Do you have a living will? YES    Do you have a DNR form?  YES                                 If not, do you want to discuss one?   Do you have signed POA/HPOA forms?        ??              If so, please bring to you appointment      [facility] (05/16/16):   Originally from [place], [place]. 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 No pets currently. No bird, mold, or hot tub exposure.          Objective:   Physical Exam BP 128/74 (BP Location: Left Arm, Cuff Size: Normal)   Pulse 100   Ht 5\' 1"  (1.549 m)   Wt 130 lb (59 kg)   SpO2 94%   BMI 24.56 kg/m  General:  Awake. Alert. No acute distress.  Integument:  Warm & dry. No rash on exposed skin.  Extremities:  No cyanosis or clubbing.  HEENT:  Moist mucus membranes. No oral ulcers. No scleral injection or icterus. Minimal nasal turbinate swelling. Cardiovascular:  Regular rate. No edema. No appreciable JVD.  Pulmonary:  Good aeration & clear to auscultation bilaterally. Symmetric chest wall expansion. No accessory muscle use on room air. Abdomen: Soft. Normal bowel sounds. Mildly protuberant.  Musculoskeletal:  Normal bulk and tone. No joint effusion appreciated.  PFT 09/05/16: FVC 1.71 L (67%) FEV1 1.13 L (59%) FEV1/FVC 0.66 FEF 25-75 0.62 L (39%) negative bronchodilator response TLC 4.77 L (103%) RV 128% ERV 32% DLCO corrected 69% 09/19/10: FVC 2.46 L (88%) FEV1 1.50 L (75%) FEV1/FVC 0.61 FEF 25-75 0.66 L (28%) negative bronchodilator response TLC 4.88 L (105%) RV 133% ERV 74% DLCO uncorrected 75%  IMAGING CT CHEST W/O 09/03/16 (personally reviewed by me):  No clear pericardial effusion. No pleural effusion or thickening. No pathologic mediastinal adenopathy. Some endobronchial debris within right  lower lobe.  Multiple bilateral subcentimeter nodules with some tree-in-bud opacities as well. No obviously developing lung mass. Largest nodule measures 8 mm in left lower lobe. Again noted apical predominant centrilobular emphysema.  CT CHEST W/O 04/20/15 (previously reviewed by me):  Mild apical predominant centrilobular emphysema. Predominantly groundglass 6 mm right lower lobe nodule relatively unchanged. No pathologic mediastinal adenopathy. No pericardial effusion. No pleural effusion or thickening.  LABS 10/03/15 IgE:  158 RAST Panel:  Cat dander 3.91 / Dog dander 0.18 / Aspergillus fumigatus 0.31    Assessment & Plan:  71 y.o. female with moderate-severe COPD based upon spirometry from today. With her underlying emphysema believe she would benefit from a LAMA. We discussed her CT scan with multiple pulmonary nodules which could represent underlying indolent infectious process or could be secondary to her rheumatoid arthritis. We will continue to monitor her lung nodules for signs of progression, especially with the 8 mm nodule in her left lower lobe with repeat CT imaging in 6 months. I instructed the patient to notify me if she had any new breathing problems or questions before next appointment.  1. Moderate-severe COPD with centrilobular emphysema:  Screening for alpha-1 antitrypsin deficiency. Switching from [facility] to Trelegy.  2. Multiple pulmonary nodules:  Repeat CT chest without contrast in 6 months. 3. Health maintenance: Status post Pneumovax October 2012. Administering Prevnar today. 4. Follow-up: Return to clinic in 6 months or sooner if needed.  Sonia Baller Ashok Cordia, M.D. [facility] Pulmonary & Critical Care Pager:  [phone] After 3pm or if no response, call [phone] 2:23 PM 09/05/16

## 2016-09-05 NOTE — Addendum Note (Signed)
Addended by: Maryanna Shape A on: 09/05/2016 04:25 PM   Modules accepted: Orders

## 2016-09-05 NOTE — Progress Notes (Signed)
PFT done today. 

## 2016-09-09 ENCOUNTER — Other Ambulatory Visit: Payer: Self-pay | Admitting: Pulmonary Disease

## 2016-09-09 LAB — ALPHA-1 ANTITRYPSIN PHENOTYPE: A-1 Antitrypsin: 171 mg/dL (ref 83–199)

## 2016-09-09 NOTE — Telephone Encounter (Signed)
lmtcb x1 for pt. 

## 2016-09-10 NOTE — Telephone Encounter (Signed)
Spoke with pt. She is needing a refill on Omeprazole. Pt was started on this medication by another physician but is wanting JN to take the prescription over.  JN - are you okay with refilling this? Thanks.

## 2016-09-10 NOTE — Telephone Encounter (Signed)
That's fine. Prilosec 20mg  po daily - #30 with 6 refills.

## 2016-09-11 MED ORDER — OMEPRAZOLE 20 MG PO CPDR: 20.0000 mg | DELAYED_RELEASE_CAPSULE | Freq: Every day | ORAL | 11 refills | Status: DC

## 2016-09-11 NOTE — Telephone Encounter (Signed)
rx sent to preferred pharmacy.  Pt aware.  Nothing further needed.  

## 2016-11-24 ENCOUNTER — Other Ambulatory Visit: Payer: Self-pay | Admitting: Orthopedic Surgery

## 2016-11-24 DIAGNOSIS — M92211 Osteochondrosis (juvenile) of carpal lunate [Kienbock], right hand: Secondary | ICD-10-CM

## 2017-01-19 ENCOUNTER — Telehealth: Payer: Self-pay | Admitting: Adult Health

## 2017-01-19 MED ORDER — MONTELUKAST SODIUM 10 MG PO TABS: ORAL_TABLET | ORAL | 5 refills | Status: DC

## 2017-01-19 NOTE — Telephone Encounter (Signed)
Pt requesting refill on Singulair.  This has been sent to preferred pharmacy.  Nothing further needed.

## 2017-02-13 ENCOUNTER — Telehealth: Payer: Self-pay | Admitting: Adult Health

## 2017-02-13 DIAGNOSIS — R911 Solitary pulmonary nodule: Secondary | ICD-10-CM

## 2017-02-13 NOTE — Telephone Encounter (Signed)
Per our protocol, pt's CT has been placed under TP. Pt has an upcoming appointment with TP on 03/06/17.

## 2017-02-24 ENCOUNTER — Inpatient Hospital Stay: Admission: RE | Admit: 2017-02-24 | Payer: Medicare Other | Source: Ambulatory Visit

## 2017-02-27 DIAGNOSIS — M931 Kienbock's disease of adults: Secondary | ICD-10-CM

## 2017-02-27 HISTORY — DX: Kienbock's disease of adults: M93.1

## 2017-03-03 ENCOUNTER — Ambulatory Visit (INDEPENDENT_AMBULATORY_CARE_PROVIDER_SITE_OTHER)
Admission: RE | Admit: 2017-03-03 | Discharge: 2017-03-03 | Disposition: A | Discharge status: Home / Self Care | Payer: Medicare Other | Source: Ambulatory Visit | Attending: Adult Health | Admitting: Adult Health

## 2017-03-03 ENCOUNTER — Telehealth: Payer: Self-pay | Admitting: Adult Health

## 2017-03-03 DIAGNOSIS — R911 Solitary pulmonary nodule: Secondary | ICD-10-CM

## 2017-03-03 MED ORDER — FLUTICASONE-UMECLIDIN-VILANT 100-62.5-25 MCG/INH IN AEPB: 1.0000 | INHALATION_SPRAY | Freq: Every day | RESPIRATORY_TRACT | 5 refills | Status: DC

## 2017-03-03 NOTE — Telephone Encounter (Signed)
Received faxed refill request from [facility] for Trelegy Last refills 1.2.19, any future refills will be placed on file   JN patient last seen 8.3.18 with recs to follow up in 6 months Pt is scheduled to see TP on 2.1.19 Refills for Trelegy sent x6 months

## 2017-03-06 ENCOUNTER — Ambulatory Visit: Payer: Medicare Other | Admitting: Adult Health

## 2017-03-06 ENCOUNTER — Encounter: Payer: Self-pay | Admitting: Adult Health

## 2017-03-06 DIAGNOSIS — J449 Chronic obstructive pulmonary disease, unspecified: Secondary | ICD-10-CM

## 2017-03-06 DIAGNOSIS — R918 Other nonspecific abnormal finding of lung field: Secondary | ICD-10-CM | POA: Diagnosis not present

## 2017-03-06 MED ORDER — FLUTICASONE-UMECLIDIN-VILANT 100-62.5-25 MCG/INH IN AEPB: 1.0000 | INHALATION_SPRAY | Freq: Every day | RESPIRATORY_TRACT | 0 refills | Status: DC

## 2017-03-06 NOTE — Progress Notes (Signed)
@Patient  ID: Desiree [name], female    DOB: [birth year], 72 y.o.   MRN: [id number]  Chief Complaint  Patient presents with  . Follow-up    COPD     Referring provider: Melony Overly, MD  HPI: 72 yo female former smoker followed for Moderate Severe COPD , Emphysema and RLL nodule  PMH significant for RA on Arava /Prednisone 5mg    TEST  PFT 09/05/16: FVC 1.71 L (67%) FEV1 1.13 L (59%) FEV1/FVC 0.66 FEF 25-75 0.62 L (39%) negative bronchodilator response TLC 4.77 L (103%) RV 128% ERV 32% DLCO corrected 69% 09/19/10:FVC 2.46 L (88%) FEV1 1.50 L (75%) FEV1/FVC 0.61 FEF 25-75 0.66 L (28%) negative bronchodilator response TLC 4.88 L (105%) RV 133% ERV 74% DLCO uncorrected 75%  CT CHEST W/O 09/03/16 (personally reviewed by me):  No clear pericardial effusion. No pleural effusion or thickening. No pathologic mediastinal adenopathy. Some endobronchial debris within right lower lobe. Multiple bilateral subcentimeter nodules with some tree-in-bud opacities as well. No obviously developing lung mass. Largest nodule measures 8 mm in left lower lobe. Again noted apical predominant centrilobular emphysema  LABS 10/03/15 IgE: 158 RAST Panel:  Cat dander 3.91 / Dog dander 0.18 / Aspergillus fumigatus 0.31   03/06/2017 Follow up : COPD , Lung nodule  Patient presents for a 57-month follow-up.  Patient has known moderate to severe COPD.  She is maintained on TRELEGY daily.  Says overall her breathing is doing okay.  She does get short of breath with activity.  She denies any chest pain orthopnea PND or increased leg swelling. Walks on treadmill 36min , this really helps her endurance.  Flu and pneumonia and Pneumovax are up-to-date  Patient has a known right lower lobe lung nodule.  CT chest done on March 03, 2017 showed mild bronchiectasis.  Moderate emphysema.  Mild patchy tree-in-bud opacities in both lungs.  Stable few scattered pulmonary nodules.  A few new left upper lobe nodules.  Some  previously patchy opacities are decreased.  This may be representative of possible MAI. She denies increased cough, congestion, fever, weight loss.    Has RA . Says under good control with Arava and predniosne 5mg  .     No Known Allergies  Immunization History  Administered Date(s) Administered  . Influenza Whole 11/22/2010  . Influenza-Unspecified 12/04/2016  . Pneumococcal Conjugate-13 09/05/2016  . Pneumococcal Polysaccharide-23 12/02/2010    Past Medical History:  Diagnosis Date  . Cat allergies   . COPD (chronic obstructive pulmonary disease) ([place])   . Hypertension   . Osteoporosis   . Pulmonary embolism ([place]) 2017  . Rheumatoid arthritis(714.0)    Dr Tobie Lords follows    Tobacco History: Social History   Tobacco Use  Smoking Status Former Smoker  . Packs/day: 1.00  . Years: 40.00  . Pack years: 40.00  . Types: Cigarettes  . Last attempt to quit: 02/03/2001  . Years since quitting: 16.0  Smokeless Tobacco Never Used   Counseling given: Not Answered   Outpatient Encounter Medications as of 03/06/2017  Medication Sig  . albuterol (PROAIR HFA) 108 (90 Base) MCG/ACT inhaler Inhale 2 puffs into the lungs every 4 (four) hours as needed for wheezing or shortness of breath.  Marland Kitchen amLODipine (NORVASC) 5 MG tablet Take 5 mg by mouth daily.  . Ascorbic Acid (VITAMIN C PO) Take by mouth.  . ASPIRIN PO Take by mouth.  . Cholecalciferol (VITAMIN D3 PO) Take by mouth.  . Cyanocobalamin (VITAMIN B-12 PO) Take by  mouth.  . Fluticasone-Umeclidin-Vilant (TRELEGY ELLIPTA) 100-62.5-25 MCG/INH AEPB Inhale 1 puff into the lungs daily.  . hydroxychloroquine (PLAQUENIL) 200 MG tablet TAKE ONE TABLET BY MOUTH WITH FOOD OR MILK TWICE DAILY  . irbesartan (AVAPRO) 300 MG tablet Take 300 mg by mouth daily.  Marland Kitchen leflunomide (ARAVA) 20 MG tablet Take 20 mg by mouth daily.  . montelukast (SINGULAIR) 10 MG tablet TAKE ONE TABLET BY MOUTH NIGHTLY  . omeprazole (PRILOSEC) 20 MG capsule Take 1  capsule (20 mg total) by mouth daily.  . predniSONE (DELTASONE) 5 MG tablet Take 5 mg by mouth daily with breakfast.  . pyridoxine (B-6) 200 MG tablet Take 200 mg by mouth daily.  . sertraline (ZOLOFT) 100 MG tablet Take 1 tablet by mouth daily.  . fluticasone furoate-vilanterol (BREO ELLIPTA) 100-25 MCG/INH AEPB Inhale 1 puff into the lungs daily. (Patient not taking: Reported on 03/06/2017)  . Fluticasone-Umeclidin-Vilant (TRELEGY ELLIPTA) 100-62.5-25 MCG/INH AEPB Inhale 1 puff into the lungs daily.   No facility-administered encounter medications on file as of 03/06/2017.      Review of Systems  Constitutional:   No  weight loss, night sweats,  Fevers, chills, fatigue, or  lassitude.  HEENT:   No headaches,  Difficulty swallowing,  Tooth/dental problems, or  Sore throat,                No sneezing, itching, ear ache, nasal congestion, post nasal drip,   CV:  No chest pain,  Orthopnea, PND, swelling in lower extremities, anasarca, dizziness, palpitations, syncope.   GI  No heartburn, indigestion, abdominal pain, nausea, vomiting, diarrhea, change in bowel habits, loss of appetite, bloody stools.   Resp:  .  No chest wall deformity  Skin: no rash or lesions.  GU: no dysuria, change in color of urine, no urgency or frequency.  No flank pain, no hematuria   MS:  No joint pain or swelling.  No decreased range of motion.  No back pain.    Physical Exam  BP 130/84 (BP Location: Left Arm, Cuff Size: Normal)   Pulse 82   Ht 5\' 1"  (1.549 m)   Wt 140 lb 9.6 oz (63.8 kg)   SpO2 95%   BMI 26.57 kg/m   GEN: A/Ox3; pleasant , NAD, elderly    HEENT:  /AT,  EACs-clear, TMs-wnl, NOSE-clear, THROAT-clear, no lesions, no postnasal drip or exudate noted.   NECK:  Supple w/ fair ROM; no JVD; normal carotid impulses w/o bruits; no thyromegaly or nodules palpated; no lymphadenopathy.    RESP  Clear  P & A; w/o, wheezes/ rales/ or rhonchi. no accessory muscle use, no dullness to  percussion  CARD:  RRR, no m/r/g, no peripheral edema, pulses intact, no cyanosis or clubbing.  GI:   Soft & nt; nml bowel sounds; no organomegaly or masses detected.   Musco: Warm bil, no deformities or joint swelling noted. Arthritic changes of hands.   Neuro: alert, no focal deficits noted.    Skin: Warm, no lesions or rashes    Lab Results:  CBC  ProBNP No results found for: PROBNP  Imaging: Ct Chest Wo Contrast  Result Date: 03/03/2017 CLINICAL DATA:  Follow-up pulmonary nodule. EXAM: CT CHEST WITHOUT CONTRAST TECHNIQUE: Multidetector CT imaging of the chest was performed following the standard protocol without IV contrast. COMPARISON:  09/03/2016 chest CT. FINDINGS: Cardiovascular: Normal heart size. No significant pericardial fluid/thickening. Left anterior descending and left circumflex coronary atherosclerosis. Atherosclerotic nonaneurysmal thoracic aorta. Normal caliber pulmonary arteries. Mediastinum/Nodes: No  discrete thyroid nodules. Unremarkable esophagus. No pathologically enlarged axillary, mediastinal or gross hilar lymph nodes, noting limited sensitivity for the detection of hilar adenopathy on this noncontrast study. Lungs/Pleura: No pneumothorax. No pleural effusion. Moderate centrilobular emphysema with diffuse bronchial wall thickening. No acute consolidative airspace disease or lung masses. There is scattered mild cylindrical bronchiectasis in both lungs, most apparent in the right middle lobe, lingula and anterior right lower lobe. There is associated mild patchy tree-in-bud opacity in both lungs, most prominent in the posterior right upper lobe and right lower lobe. There are a few scattered solid pulmonary nodules throughout both lungs, which appear generally centrilobular in distribution. Some of the centrilobular pulmonary nodules are stable in the interval, for example a 5 mm posterior right upper lobe nodule (series 3/image 49). Some of the centrilobular nodules  are new in the interval, for example a 5 mm left upper lobe nodule (series 3/image 24). Some of the previously visualized patchy tree-in-bud opacities are decreased, for example in the left upper lobe. Some of the patchy tree-in-bud opacities are increased, for example in the central right lower lobe. Upper abdomen: Partially visualized simple appearing 1.5 cm inferior right liver lobe cyst. Several scattered subcentimeter hypodense lesions in the visualized liver, too small to characterize, not appreciably changed. Musculoskeletal: No aggressive appearing focal osseous lesions. Moderate thoracic spondylosis. IMPRESSION: 1. Spectrum of findings suggestive of chronic mild infectious or inflammatory bronchiolitis such as due to atypical mycobacterial infection (MAI) and/or recurrent aspiration. Mild cylindrical bronchiectasis, mild patchy tree-in-bud opacities and scattered centrilobular pulmonary nodules in both lungs. Overall, the findings are similar, noting some areas of new/increased tree-in-bud opacity/nodularity and some areas of decreased tree-in-bud opacity/nodularity, as detailed. Given the patient's risk factors, a follow-up high-resolution chest CT study is suggested in 6 months. 2. Moderate centrilobular emphysema with diffuse bronchial wall thickening, suggesting COPD. 3. Two vessel coronary atherosclerosis. Aortic Atherosclerosis (ICD10-I70.0) and Emphysema (ICD10-J43.9). Electronically Signed   By: Ilona Sorrel M.D.   On: 03/03/2017 16:13     Assessment & Plan:   Multiple lung nodules on CT Follow on CT chest , no active sx /flare  Check CT chest in 6 months   COPD (chronic obstructive pulmonary disease) Stable  No changes      Sabir Charters, NP 03/06/2017

## 2017-03-06 NOTE — Patient Instructions (Addendum)
Continue on TRELEGY daily, rinse after use Mucinex DM twice daily as needed for cough congestion Saline nasal rinses as needed Follow-up CT chest in 6 months Follow-up with Dr. Lake Bells 4 months and As needed   .

## 2017-03-06 NOTE — Assessment & Plan Note (Signed)
Stable No changes 

## 2017-03-06 NOTE — Assessment & Plan Note (Signed)
Follow on CT chest , no active sx /flare  Check CT chest in 6 months

## 2017-03-10 NOTE — Progress Notes (Signed)
Reviewed, agree 

## 2017-07-14 ENCOUNTER — Ambulatory Visit: Payer: Medicare Other | Admitting: Pulmonary Disease

## 2017-07-14 DIAGNOSIS — R21 Rash and other nonspecific skin eruption: Secondary | ICD-10-CM

## 2017-07-14 DIAGNOSIS — Z9181 History of falling: Secondary | ICD-10-CM | POA: Insufficient documentation

## 2017-07-14 DIAGNOSIS — K219 Gastro-esophageal reflux disease without esophagitis: Secondary | ICD-10-CM

## 2017-07-14 DIAGNOSIS — M8700 Idiopathic aseptic necrosis of unspecified bone: Secondary | ICD-10-CM

## 2017-07-14 DIAGNOSIS — Z86711 Personal history of pulmonary embolism: Secondary | ICD-10-CM

## 2017-07-14 DIAGNOSIS — E538 Deficiency of other specified B group vitamins: Secondary | ICD-10-CM

## 2017-07-14 DIAGNOSIS — E782 Mixed hyperlipidemia: Secondary | ICD-10-CM | POA: Insufficient documentation

## 2017-07-14 DIAGNOSIS — R5381 Other malaise: Secondary | ICD-10-CM | POA: Insufficient documentation

## 2017-07-14 DIAGNOSIS — R7303 Prediabetes: Secondary | ICD-10-CM

## 2017-07-14 DIAGNOSIS — F330 Major depressive disorder, recurrent, mild: Secondary | ICD-10-CM | POA: Insufficient documentation

## 2017-07-14 DIAGNOSIS — R5383 Other fatigue: Secondary | ICD-10-CM

## 2017-07-14 HISTORY — DX: Rash and other nonspecific skin eruption: R21

## 2017-07-14 HISTORY — DX: Other fatigue: R53.83

## 2017-07-14 HISTORY — DX: Prediabetes: R73.03

## 2017-07-14 HISTORY — DX: Mixed hyperlipidemia: E78.2

## 2017-07-14 HISTORY — DX: Other malaise: R53.81

## 2017-07-14 HISTORY — DX: History of falling: Z91.81

## 2017-07-14 HISTORY — DX: Deficiency of other specified B group vitamins: E53.8

## 2017-07-14 HISTORY — DX: Idiopathic aseptic necrosis of unspecified bone: M87.00

## 2017-07-14 HISTORY — DX: Gastro-esophageal reflux disease without esophagitis: K21.9

## 2017-07-14 HISTORY — DX: Major depressive disorder, recurrent, mild: F33.0

## 2017-07-14 HISTORY — DX: Personal history of pulmonary embolism: Z86.711

## 2017-07-23 ENCOUNTER — Other Ambulatory Visit: Payer: Self-pay | Admitting: Adult Health

## 2017-08-25 ENCOUNTER — Telehealth: Payer: Self-pay | Admitting: Pulmonary Disease

## 2017-08-25 DIAGNOSIS — R911 Solitary pulmonary nodule: Secondary | ICD-10-CM

## 2017-08-25 NOTE — Telephone Encounter (Addendum)
Called and spoke to Collins with [facility]. CT today showed New RML nodule 0.64mm & .9cm LLL nodule.  Servando Snare stated that results will be faxed to our office.  BQ has been made aware verbally.  Routing to BQ for f/u.

## 2017-08-25 NOTE — Telephone Encounter (Signed)
PET has been ordered for [facility] per pt request. 08/27/17 OV has been canceled.  Pt has been scheduled for 09/09/17, as pt prefer [place] office.  Okay to use held slot per BQ verbally. Nothing further is needed.

## 2017-08-25 NOTE — Telephone Encounter (Signed)
I called Desiree [name] to tell her that the nodule has increased in size and she needs a PET scan. She has requested that we move her appointment back 1-2 weeks.  This is fine by me, can be put in an HP slot at 1:45 if necessary based on my schedule.  Please order PET CT for nodule

## 2017-08-26 ENCOUNTER — Ambulatory Visit: Payer: Medicare Other | Admitting: Pulmonary Disease

## 2017-08-27 ENCOUNTER — Ambulatory Visit: Payer: Medicare Other | Admitting: Pulmonary Disease

## 2017-09-09 ENCOUNTER — Encounter: Payer: Self-pay | Admitting: Pulmonary Disease

## 2017-09-09 ENCOUNTER — Ambulatory Visit: Payer: Medicare Other | Admitting: Pulmonary Disease

## 2017-09-09 VITALS — BP 126/70 | HR 90 | Ht 61.0 in | Wt 142.0 lb

## 2017-09-09 DIAGNOSIS — J449 Chronic obstructive pulmonary disease, unspecified: Secondary | ICD-10-CM

## 2017-09-09 DIAGNOSIS — R918 Other nonspecific abnormal finding of lung field: Secondary | ICD-10-CM | POA: Diagnosis not present

## 2017-09-09 MED ORDER — FLUTICASONE-UMECLIDIN-VILANT 100-62.5-25 MCG/INH IN AEPB: 1.0000 | INHALATION_SPRAY | Freq: Every day | RESPIRATORY_TRACT | 0 refills | Status: DC

## 2017-09-09 NOTE — Addendum Note (Signed)
Addended by: Len Blalock on: 09/09/2017 05:20 PM   Modules accepted: Orders

## 2017-09-09 NOTE — Progress Notes (Signed)
Synopsis: Former patient of Dr. Ashok Cordia with COPD and pulmonary nodules  Subjective:   PATIENT ID: Desiree [name] GENDER: female DOB: 10/03/1945, MRN: [id number]   HPI  Chief Complaint  Patient presents with  . Follow-up    review PET.  pt denies any current breathing complaints.      This is a pleasant 72 year old female who used to smoke 1 pack of cigarettes daily through 2002 who comes to my clinic to establish care for COPD, centrilobular emphysema and pulmonary nodules.  She was formally followed by my partner.  She says that her breathing has been stable since the last visit.  She does have some trouble climbing a flight of stairs and with some exertion but she says that there is no significant change compared to baseline.  She is not producing mucus when she coughs.  She has not had fevers chills or sweats.  She has been found to have multiple pulmonary nodules which have been followed most recently.  In July 2019 she had a CT scan which showed slight increase in size of the left lower lobe nodule and a PET scan was performed which showed low-level FDG activity.  Past Medical History:  Diagnosis Date  . Cat allergies   . COPD (chronic obstructive pulmonary disease) ([place])   . Hypertension   . Osteoporosis   . Pulmonary embolism ([place]) 2017  . Rheumatoid arthritis(714.0)    Dr Tobie Lords follows     Family History  Problem Relation Age of Onset  . Heart disease Father   . Stroke Mother   . Clotting disorder Mother   . Heart disease Sister   . Sleep apnea Sister   . Emphysema Sister   . Breast cancer Maternal Aunt   . Emphysema Paternal Grandmother      Social History   Socioeconomic History  . Marital status: Widowed    Spouse name: Not on file  . Number of children: Not on file  . Years of education: Not on file  . Highest education level: Not on file  Occupational History  . Occupation: retired  Scientific laboratory technician  . Financial resource strain: Not on file  .  Food insecurity:    Worry: Not on file    Inability: Not on file  . Transportation needs:    Medical: Not on file    Non-medical: Not on file  Tobacco Use  . Smoking status: Former Smoker    Packs/day: 1.00    Years: 40.00    Pack years: 40.00    Types: Cigarettes    Last attempt to quit: 02/03/2001    Years since quitting: 16.6  . Smokeless tobacco: Never Used  Substance and Sexual Activity  . Alcohol use: Yes    Alcohol/week: 1.2 oz    Types: 2 Standard drinks or equivalent per week    Comment: 2 drinks daily  . Drug use: No  . Sexual activity: Not on file  Lifestyle  . Physical activity:    Days per week: Not on file    Minutes per session: Not on file  . Stress: Not on file  Relationships  . Social connections:    Talks on phone: Not on file    Gets together: Not on file    Attends religious service: Not on file    Active member of club or organization: Not on file    Attends meetings of clubs or organizations: Not on file    Relationship status: Not on  file  . Intimate partner violence:    Fear of current or ex partner: Not on file    Emotionally abused: Not on file    Physically abused: Not on file    Forced sexual activity: Not on file  Other Topics Concern  . Not on file  Social History Narrative   Tobacco use, amount per day now: NONE   Past tobacco use, amount per day: 1 PACK PER DAY   How many years did you use tobacco:TOO MANY STOPPED IN 2003   Alcohol use (drinks per week): 14   Diet: GOOD   Do you drink/eat things with caffeine: YES   Marital status: WIDOW                What year were you married? 1967   Do you live in a house, apartment, assisted living, condo, trailer, etc.? HOUSE   Is it one or more stories? SPLIT LEVEL   How many persons live in your home? 1   Do you have pets in your home?( please list) NONE   Current or past profession: [employer]   Do you exercise?  NO                                Type and  how often?   Do you have a living will? YES    Do you have a DNR form?  YES                                 If not, do you want to discuss one?   Do you have signed POA/HPOA forms?        ??              If so, please bring to you appointment      [facility] (05/16/16):   Originally from [place], [place]. 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 No pets currently. No bird, mold, or hot tub exposure.      No Known Allergies   Outpatient Medications Prior to Visit  Medication Sig Dispense Refill  . albuterol (PROAIR HFA) 108 (90 Base) MCG/ACT inhaler Inhale 2 puffs into the lungs every 4 (four) hours as needed for wheezing or shortness of breath.    Marland Kitchen amLODipine (NORVASC) 5 MG tablet Take 5 mg by mouth daily.    . Ascorbic Acid (VITAMIN C PO) Take 1 tablet by mouth daily.     . ASPIRIN PO Take 325 mg by mouth daily.     . Cholecalciferol (VITAMIN D3 PO) Take 1 tablet by mouth daily.     . Cyanocobalamin (VITAMIN B-12 PO) Take by mouth.    . Fluticasone-Umeclidin-Vilant (TRELEGY ELLIPTA) 100-62.5-25 MCG/INH AEPB Inhale 1 puff into the lungs daily. 1 each 0  . hydroxychloroquine (PLAQUENIL) 200 MG tablet 2 tabs twice daily  3  . irbesartan (AVAPRO) 300 MG tablet Take 300 mg by mouth daily.    . montelukast (SINGULAIR) 10 MG tablet TAKE ONE TABLET BY MOUTH AT BEDTIME 30 tablet 1  . omeprazole (PRILOSEC) 20 MG capsule Take 1 capsule (20 mg total) by mouth daily. 30 capsule 11  . predniSONE (DELTASONE) 5 MG tablet Take 5 mg by mouth daily with breakfast.    . sertraline (ZOLOFT) 100 MG tablet Take 1 tablet  by mouth daily.    . valsartan (DIOVAN) 320 MG tablet Take 320 mg by mouth daily.    . Fluticasone-Umeclidin-Vilant (TRELEGY ELLIPTA) 100-62.5-25 MCG/INH AEPB Inhale 1 puff into the lungs daily. 60 each 5  . fluticasone furoate-vilanterol (BREO ELLIPTA) 100-25 MCG/INH AEPB Inhale 1 puff into the lungs daily. (Patient not taking: Reported on 09/09/2017) 1  each 2  . leflunomide (ARAVA) 20 MG tablet Take 20 mg by mouth daily.    Marland Kitchen pyridoxine (B-6) 200 MG tablet Take 200 mg by mouth daily.     No facility-administered medications prior to visit.     Review of Systems  Constitutional: Negative for diaphoresis, fever and weight loss.  HENT: Negative for congestion, sinus pain and tinnitus.   Respiratory: Positive for cough and shortness of breath. Negative for sputum production and wheezing.   Cardiovascular: Negative for palpitations, claudication and PND.      Objective:  Physical Exam   Vitals:   09/09/17 1633  BP: 126/70  Pulse: 90  SpO2: 93%  Weight: 142 lb (64.4 kg)  Height: 5\' 1"  (1.549 m)    Gen: chronically ill appearing HENT: OP clear, TM's clear, neck supple PULM: Wheezing bilaterally B, normal percussion CV: RRR, no mgr, trace edema GI: BS+, soft, nontender Derm: no cyanosis or rash Psyche: normal mood and affect   CBC    Component Value Date/Time   WBC 6.4 10/03/2015 1512   WBC 3.4 (L) 11/24/2014 0029   RBC 3.77 10/03/2015 1512   RBC 3.09 (L) 11/24/2014 0029   HGB 12.1 10/03/2015 1512   HCT 36.7 10/03/2015 1512   PLT 349 10/03/2015 1512   MCV 97 10/03/2015 1512   MCH 32.1 10/03/2015 1512   MCH 32.7 11/24/2014 0029   MCHC 33.0 10/03/2015 1512   MCHC 33.9 11/24/2014 0029   RDW 14.0 10/03/2015 1512   LYMPHSABS 0.6 (L) 10/03/2015 1512   MONOABS 1.2 (H) 11/24/2014 0029   EOSABS 0.2 10/03/2015 1512   BASOSABS 0.0 10/03/2015 1512     PFT 09/05/16: FVC 1.71 L (67%) FEV1 1.13 L (59%) FEV1/FVC 0.66 FEF 25-75 0.62 L (39%) negative bronchodilator response TLC 4.77 L (103%) RV 128% ERV 32% DLCO corrected 69% 09/19/10:FVC 2.46 L (88%) FEV1 1.50 L (75%) FEV1/FVC 0.61 FEF 25-75 0.66 L (28%) negative bronchodilator response TLC 4.88 L (105%) RV 133% ERV 74% DLCO uncorrected 75%  IMAGING July 2019 PET/CT showed that the 8 mm nodule in the left lower lobe has an FDG activity of 1.05 August 2017 CT chest images  independently reviewed showing centrilobular emphysema, right middle lobe and lingular bronchiectasis, multiple scattered pulmonary nodules, most of which are about 4 mm in size but in the left lower lobe there is an 8 mm nodule.  CT CHEST W/O 09/03/16 :  No clear pericardial effusion. No pleural effusion or thickening. No pathologic mediastinal adenopathy. Some endobronchial debris within right lower lobe. Multiple bilateral subcentimeter nodules with some tree-in-bud opacities as well. No obviously developing lung mass. Largest nodule measures 8 mm in left lower lobe. Again noted apical predominant centrilobular emphysema.  CT CHEST W/O 04/20/15 : Mild apical predominant centrilobular emphysema. Predominantly groundglass 6 mm right lower lobe nodule relatively unchanged. No pathologic mediastinal adenopathy. No pericardial effusion. No pleural effusion or thickening.  LABS 10/03/15 IgE: 158 RAST Panel:  Cat dander 3.91 / Dog dander 0.18 / Aspergillus fumigatus 0.31     Assessment & Plan:   Multiple lung nodules on CT  Abnormal findings on  diagnostic imaging of lung - Plan: CT Chest Wo Contrast  Chronic obstructive pulmonary disease, unspecified COPD type ([place])  Discussion: Ms. Khiev comes to my clinic today to continue care for her COPD which has been stable in the interim since the last visit.  However, she has this left lower lobe nodule which is slightly increased in size over the last year.  I am a bit confused because in prior studies it was measured around 8 mm and then apparently it had decreased on a read earlier in 2019.  Regardless, because she has a history of smoking we need to watch it closely.  Reassuringly though she has multiple scattered pulmonary nodules and some bronchiectasis so I think the likelihood of her having an atypical infection like MAI is actually quite high.  She would be considered colonized and not need of treatment right now.  We talked about the risks and  benefits of doing a biopsy now versus just watching it.  She and I feel that the best approach is to do serial imaging as the likelihood of a complication from a biopsy procedure exceeds the likelihood of malignancy right now.  Multiple scattered pulmonary nodules, the largest of which is in the left lower lobe at 8 mm: We will plan on a repeat CT scan of your chest in January 2020 If the nodule is increasing in size at that point then we will arrange for a biopsy  COPD: Continue Trelegy daily Continue albuterol as needed for chest tightness wheezing or shortness of breath Continue to practice good hand hygiene Get a flu shot in the fall Stay active  We will see you back in January 2012  > 50% of this 40 minute visit spent face to face    Current Outpatient Medications:  .  albuterol (PROAIR HFA) 108 (90 Base) MCG/ACT inhaler, Inhale 2 puffs into the lungs every 4 (four) hours as needed for wheezing or shortness of breath., Disp: , Rfl:  .  amLODipine (NORVASC) 5 MG tablet, Take 5 mg by mouth daily., Disp: , Rfl:  .  Ascorbic Acid (VITAMIN C PO), Take 1 tablet by mouth daily. , Disp: , Rfl:  .  ASPIRIN PO, Take 325 mg by mouth daily. , Disp: , Rfl:  .  Cholecalciferol (VITAMIN D3 PO), Take 1 tablet by mouth daily. , Disp: , Rfl:  .  Cyanocobalamin (VITAMIN B-12 PO), Take by mouth., Disp: , Rfl:  .  Fluticasone-Umeclidin-Vilant (TRELEGY ELLIPTA) 100-62.5-25 MCG/INH AEPB, Inhale 1 puff into the lungs daily., Disp: 1 each, Rfl: 0 .  hydroxychloroquine (PLAQUENIL) 200 MG tablet, 2 tabs twice daily, Disp: , Rfl: 3 .  irbesartan (AVAPRO) 300 MG tablet, Take 300 mg by mouth daily., Disp: , Rfl:  .  montelukast (SINGULAIR) 10 MG tablet, TAKE ONE TABLET BY MOUTH AT BEDTIME, Disp: 30 tablet, Rfl: 1 .  omeprazole (PRILOSEC) 20 MG capsule, Take 1 capsule (20 mg total) by mouth daily., Disp: 30 capsule, Rfl: 11 .  predniSONE (DELTASONE) 5 MG tablet, Take 5 mg by mouth daily with breakfast., Disp: ,  Rfl:  .  sertraline (ZOLOFT) 100 MG tablet, Take 1 tablet by mouth daily., Disp: , Rfl:  .  valsartan (DIOVAN) 320 MG tablet, Take 320 mg by mouth daily., Disp: , Rfl:

## 2017-09-09 NOTE — Patient Instructions (Addendum)
COPD: Continue Trelegy daily Continue albuterol as needed for chest tightness wheezing or shortness of breath Continue to practice good hand hygiene Get a flu shot in the fall Stay active  We will see you back in January 2020

## 2017-09-15 ENCOUNTER — Other Ambulatory Visit: Payer: Self-pay | Admitting: Pulmonary Disease

## 2017-09-15 MED ORDER — OMEPRAZOLE 20 MG PO CPDR: 20.0000 mg | DELAYED_RELEASE_CAPSULE | Freq: Every day | ORAL | 5 refills | Status: DC

## 2017-09-21 ENCOUNTER — Other Ambulatory Visit: Payer: Self-pay | Admitting: Adult Health

## 2017-09-22 ENCOUNTER — Telehealth: Payer: Self-pay | Admitting: Pulmonary Disease

## 2017-09-22 MED ORDER — MONTELUKAST SODIUM 10 MG PO TABS: ORAL_TABLET | ORAL | 1 refills | Status: DC

## 2017-09-22 NOTE — Telephone Encounter (Signed)
Called and spoke with pt regarding refill on rx Singulair Refilled sent to pharmacy Pt verbalized understanding Nothing further needed.

## 2017-09-30 ENCOUNTER — Other Ambulatory Visit: Payer: Self-pay | Admitting: Adult Health

## 2017-11-20 ENCOUNTER — Telehealth: Payer: Self-pay | Admitting: Pulmonary Disease

## 2017-11-20 ENCOUNTER — Other Ambulatory Visit: Payer: Self-pay | Admitting: Pulmonary Disease

## 2017-11-20 NOTE — Telephone Encounter (Signed)
Spoke with the pt  I advised her CT is ordered  She verbalized understanding  She wants [place] to be aware that she will be out of the country from 01/14/18 until 02/11/18  Will forward to the PCC's to make them aware  Thanks

## 2017-11-20 NOTE — Telephone Encounter (Signed)
Pt is calling back [phone]

## 2017-11-20 NOTE — Telephone Encounter (Signed)
LMTCB  The ct is due in Jan 2020 and was already ordered but not scheduled yet

## 2017-11-20 NOTE — Telephone Encounter (Signed)
I made a note on pt's order of dates she will be out of the country.  Nothing further needed.

## 2018-01-04 DIAGNOSIS — N183 Chronic kidney disease, stage 3 unspecified: Secondary | ICD-10-CM

## 2018-01-04 DIAGNOSIS — D692 Other nonthrombocytopenic purpura: Secondary | ICD-10-CM

## 2018-01-04 HISTORY — DX: Other nonthrombocytopenic purpura: D69.2

## 2018-01-04 HISTORY — DX: Chronic kidney disease, stage 3 unspecified: N18.30

## 2018-02-18 ENCOUNTER — Encounter: Payer: Self-pay | Admitting: Pulmonary Disease

## 2018-02-24 ENCOUNTER — Encounter: Payer: Self-pay | Admitting: Pulmonary Disease

## 2018-02-24 ENCOUNTER — Ambulatory Visit: Payer: Medicare Other | Admitting: Pulmonary Disease

## 2018-02-24 VITALS — BP 144/76 | HR 83 | Ht 61.0 in | Wt 144.0 lb

## 2018-02-24 DIAGNOSIS — R059 Cough, unspecified: Secondary | ICD-10-CM

## 2018-02-24 DIAGNOSIS — R918 Other nonspecific abnormal finding of lung field: Secondary | ICD-10-CM | POA: Diagnosis not present

## 2018-02-24 DIAGNOSIS — R05 Cough: Secondary | ICD-10-CM

## 2018-02-24 DIAGNOSIS — R0609 Other forms of dyspnea: Secondary | ICD-10-CM

## 2018-02-24 DIAGNOSIS — J449 Chronic obstructive pulmonary disease, unspecified: Secondary | ICD-10-CM

## 2018-02-24 DIAGNOSIS — R0600 Dyspnea, unspecified: Secondary | ICD-10-CM

## 2018-02-24 MED ORDER — FLUTICASONE-UMECLIDIN-VILANT 100-62.5-25 MCG/INH IN AEPB: 1.0000 | INHALATION_SPRAY | Freq: Every day | RESPIRATORY_TRACT | 0 refills | Status: DC

## 2018-02-24 MED ORDER — ALBUTEROL SULFATE (2.5 MG/3ML) 0.083% IN NEBU: 2.5000 mg | INHALATION_SOLUTION | Freq: Four times a day (QID) | RESPIRATORY_TRACT | 11 refills | Status: DC | PRN

## 2018-02-24 MED ORDER — DOXYCYCLINE HYCLATE 100 MG PO TABS: 100.0000 mg | ORAL_TABLET | Freq: Two times a day (BID) | ORAL | 0 refills | Status: DC

## 2018-02-24 MED ORDER — PREDNISONE 20 MG PO TABS: 20.0000 mg | ORAL_TABLET | Freq: Every day | ORAL | 0 refills | Status: DC

## 2018-02-24 NOTE — Patient Instructions (Signed)
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  Details described above.  We will likely need to treat you for this infection.  We will see you back in 3 months or sooner if needed

## 2018-02-24 NOTE — Progress Notes (Signed)
Synopsis: Former patient of Dr. Ashok Cordia with COPD and pulmonary nodules  Subjective:   PATIENT ID: Desiree [name] GENDER: female DOB: Feb 15, 1945, MRN: [id number]   HPI  Chief Complaint  Patient presents with  . Follow-up    pt c/o "cold" symptoms- sinus and chest congestion, pnd, sore throat, prod cough with white/gray mucus X2 wks.     Desiree [name] went to [place] for a month to visit her daughter.  She has been sick since then with clear mucus production, grey too, increased shortness of breath.  She says that typically she doesn't produce mucus during the daytime.   She says that in the last year she has been experiencing increasing shortness of breath and generalized fatigue.  This happened before she went over to [place] as well.  She has been compliant with her Trelegy on a daily basis.  She has used albuterol which she says is somewhat helpful but not completely.  Past Medical History:  Diagnosis Date  . Cat allergies   . COPD (chronic obstructive pulmonary disease) ([place])   . Hypertension   . Osteoporosis   . Pulmonary embolism ([place]) 2017  . Rheumatoid arthritis(714.0)    Dr Tobie Lords follows     Review of Systems  Constitutional: Negative for diaphoresis, fever and weight loss.  HENT: Negative for congestion, sinus pain and tinnitus.   Respiratory: Positive for cough and shortness of breath. Negative for sputum production and wheezing.   Cardiovascular: Negative for palpitations, claudication and PND.      Objective:  Physical Exam   Vitals:   02/24/18 1407  BP: (!) 144/76  Pulse: 83  SpO2: 96%  Weight: 144 lb (65.3 kg)  Height: 5\' 1"  (1.549 m)    Gen: well appearing HENT: OP clear, TM's clear, neck supple PULM: Wheezing bases B, normal percussion CV: RRR, no mgr, trace edema GI: BS+, soft, nontender Derm: no cyanosis or rash Psyche: normal mood and affect   CBC    Component Value Date/Time   WBC 6.4 10/03/2015 1512   WBC 3.4 (L) 11/24/2014  0029   RBC 3.77 10/03/2015 1512   RBC 3.09 (L) 11/24/2014 0029   HGB 12.1 10/03/2015 1512   HCT 36.7 10/03/2015 1512   PLT 349 10/03/2015 1512   MCV 97 10/03/2015 1512   MCH 32.1 10/03/2015 1512   MCH 32.7 11/24/2014 0029   MCHC 33.0 10/03/2015 1512   MCHC 33.9 11/24/2014 0029   RDW 14.0 10/03/2015 1512   LYMPHSABS 0.6 (L) 10/03/2015 1512   MONOABS 1.2 (H) 11/24/2014 0029   EOSABS 0.2 10/03/2015 1512   BASOSABS 0.0 10/03/2015 1512     PFT 09/05/16: FVC 1.71 L (67%) FEV1 1.13 L (59%) FEV1/FVC 0.66 FEF 25-75 0.62 L (39%) negative bronchodilator response TLC 4.77 L (103%) RV 128% ERV 32% DLCO corrected 69% 09/19/10:FVC 2.46 L (88%) FEV1 1.50 L (75%) FEV1/FVC 0.61 FEF 25-75 0.66 L (28%) negative bronchodilator response TLC 4.88 L (105%) RV 133% ERV 74% DLCO uncorrected 75%  IMAGING January 2020 CT chest images independently reviewed showing that the nodules scattered throughout her lungs are decreased in size but there is some cylindrical bronchiectasis noted as well as tree-in-bud abnormalities.  July 2019 PET/CT showed that the 8 mm nodule in the left lower lobe has an FDG activity of 1.05 August 2017 CT chest images independently reviewed showing centrilobular emphysema, right middle lobe and lingular bronchiectasis, multiple scattered pulmonary nodules, most of which are about 4 mm in size but  in the left lower lobe there is an 8 mm nodule.  CT CHEST W/O 09/03/16 :  No clear pericardial effusion. No pleural effusion or thickening. No pathologic mediastinal adenopathy. Some endobronchial debris within right lower lobe. Multiple bilateral subcentimeter nodules with some tree-in-bud opacities as well. No obviously developing lung mass. Largest nodule measures 8 mm in left lower lobe. Again noted apical predominant centrilobular emphysema.  CT CHEST W/O 04/20/15 : Mild apical predominant centrilobular emphysema. Predominantly groundglass 6 mm right lower lobe nodule relatively unchanged.  No pathologic mediastinal adenopathy. No pericardial effusion. No pleural effusion or thickening.  LABS 10/03/15 IgE: 158 RAST Panel:  Cat dander 3.91 / Dog dander 0.18 / Aspergillus fumigatus 0.31     Assessment & Plan:   Cough - Plan: Fungus Culture & Smear, Respiratory or Resp and Sputum Culture, AFB culture with smear, AFB culture with smear, Respiratory or Resp and Sputum Culture, Fungus Culture & Smear  Chronic obstructive pulmonary disease, unspecified COPD type ([place]) - Plan: Ambulatory Referral for DME  Abnormal findings on diagnostic imaging of lung  Multiple lung nodules on CT  Dyspnea on exertion  Discussion: She is having an exacerbation of her COPD and bronchiectasis.  Further, I am concerned that she is actually infected with MAI at this point because she describes several months of increasing fatigue and shortness of breath.  While these symptoms could be explained by her chronic lung disease, the imaging findings appear a bit more pronounced to me from this month CT scan.  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  Details described above.  We will likely need to treat you for this infection.  We will see you back in 3 months or sooner if needed  Greater than 50%  of this 30-minute visit spent face-to-face     Current Outpatient Medications:  .  albuterol (PROAIR HFA) 108 (90 Base) MCG/ACT inhaler, Inhale 2 puffs into the lungs every 4 (four) hours as needed for wheezing or shortness of breath., Disp: , Rfl:  .  amLODipine (NORVASC) 5 MG tablet, Take 5 mg by mouth daily., Disp: , Rfl:  .  Ascorbic Acid (VITAMIN C PO), Take 1 tablet by mouth daily. , Disp: , Rfl:  .  ASPIRIN PO, Take 325 mg by mouth daily. , Disp: , Rfl:  .  Cholecalciferol (VITAMIN D3 PO), Take 1 tablet by mouth daily. , Disp: , Rfl:  .  Cyanocobalamin (VITAMIN B-12 PO), Take by mouth., Disp: , Rfl:  .  Fluticasone-Umeclidin-Vilant (TRELEGY ELLIPTA) 100-62.5-25 MCG/INH AEPB, Inhale 1 puff into the lungs daily., Disp: 1 each, Rfl: 0 .  hydroxychloroquine (PLAQUENIL) 200 MG tablet, 2 tabs twice daily, Disp: , Rfl: 3 .  irbesartan (AVAPRO) 300 MG tablet, Take 300 mg by mouth daily., Disp: , Rfl:  .  montelukast (SINGULAIR) 10 MG tablet, TAKE ONE TABLET BY MOUTH AT BEDTIME, Disp: 30 tablet, Rfl: 3 .  omeprazole (PRILOSEC) 20 MG capsule, Take 1 capsule (20 mg total) by mouth daily., Disp: 30 capsule, Rfl: 5 .  predniSONE (DELTASONE) 5 MG tablet, Take 5 mg by mouth daily with breakfast., Disp: , Rfl:  .  sertraline (ZOLOFT) 100 MG tablet, Take 1 tablet by mouth daily., Disp: , Rfl:  .  TRELEGY ELLIPTA 100-62.5-25 MCG/INH AEPB, Inhale 1 puff into the lungs daily., Disp: 60 each, Rfl: 5 .  valsartan (DIOVAN) 320 MG tablet, Take 320 mg by mouth daily., Disp: , Rfl:  .  albuterol (PROVENTIL) (2.5 MG/3ML) 0.083% nebulizer solution, Take 3 mLs (2.5 mg total) by nebulization every 6 (six) hours as needed for wheezing or shortness of breath., Disp: 360 mL, Rfl: 11 .  doxycycline (VIBRA-TABS) 100 MG tablet, Take 1 tablet (100 mg total) by mouth 2 (two) times daily., Disp: 10 tablet, Rfl: 0 .  Fluticasone-Umeclidin-Vilant (TRELEGY ELLIPTA) 100-62.5-25 MCG/INH AEPB, Inhale 1 puff into the lungs daily.,  Disp: 1 each, Rfl: 0 .  predniSONE (DELTASONE) 20 MG tablet, Take 1 tablet (20 mg total) by mouth daily with breakfast., Disp: 5 tablet, Rfl: 0

## 2018-03-07 ENCOUNTER — Other Ambulatory Visit: Payer: Self-pay | Admitting: Pulmonary Disease

## 2018-03-08 ENCOUNTER — Other Ambulatory Visit: Payer: Self-pay

## 2018-03-08 ENCOUNTER — Telehealth: Payer: Self-pay | Admitting: Pulmonary Disease

## 2018-03-08 MED ORDER — LEVOFLOXACIN 750 MG PO TABS: 750.0000 mg | ORAL_TABLET | Freq: Every day | ORAL | 0 refills | Status: DC

## 2018-03-08 MED ORDER — DOXYCYCLINE HYCLATE 100 MG PO TABS: 100.0000 mg | ORAL_TABLET | Freq: Two times a day (BID) | ORAL | 0 refills | Status: DC

## 2018-03-08 NOTE — Telephone Encounter (Signed)
Please see earlier phone note dated 03/08/18.

## 2018-03-08 NOTE — Telephone Encounter (Signed)
There is a chance that it could increase QT prolongation. I suspect that this will not change anything as far as in the plan of care as patient has exacerbation of bronchiectasis as well as COPD.  This is a classic risk benefit discussion as the patient has probably a serious chronic infection which is why he was covering the patient with a strong antibiotic such as Levaquin.   Last EKG in patient's chart was 2016.  Can offer patient:   Doxycycline >>> 1 100 mg tablet every 12 hours for 10 days >>>take with food  >>>wear sunscreen  >>>this replaces Levaquin  Please place the order.  Will route to Dr. Lake Bells for FYI, if he has additional recs he can make them.    Wyn Quaker FNP

## 2018-03-08 NOTE — Telephone Encounter (Signed)
Spoke with pharmacist Caryl Pina at [facility] to change rx as documented.  This has been updated in pt's chart.  lmtcb for pt to make aware of rx change.

## 2018-03-08 NOTE — Telephone Encounter (Signed)
Pt is aware of change in medication as listed below.  Nothing further is needed.

## 2018-03-08 NOTE — Telephone Encounter (Signed)
Called and spoke with [place], [facility].  She stated that the Patient is currently taking Paquenil, and the Levaquin prescription flagged interaction.  She stated that interaction was increase heart rate.  She wanted to know if Dr Lake Bells was aware.   Will route message to Wyn Quaker, NP to advise

## 2018-03-09 ENCOUNTER — Telehealth: Payer: Self-pay | Admitting: Pulmonary Disease

## 2018-03-09 MED ORDER — LEVOFLOXACIN 750 MG PO TABS: 750.0000 mg | ORAL_TABLET | Freq: Every day | ORAL | 0 refills | Status: DC

## 2018-03-09 NOTE — Telephone Encounter (Signed)
Triage.     Please contact the patient and let them know that I have spoken with Dr. Lake Bells today.  The patient needs to go back on Levaquin as prescribed by Dr. Lake Bells. This is specifically due to the fact that her sputum culture grew out: Neisseria meningitidis.  Dr. Lake Bells has already discussed this with infectious disease.  Dr. Lake Bells would like for the patient to return back to Levaquin 750 mg daily for 5 days.  Please replace the order for Levaquin 750 mg daily for 5 days. Patient is to stop Plaquenil for the 5 days that she is taking her Levaquin. Do not take doxycycline.    Take the Levaquin as prescribed.  If you have questions please follow-up with our office.  Hold Plaquenil for the 5 days that you are taking the Levaquin.  Wyn Quaker, FNP

## 2018-03-09 NOTE — Telephone Encounter (Signed)
Called and spoke Almyra Free from [facility]. She stated that there was an order for the Levaquin as well as the Doxycycline in the system for the patient and they wanted clarification of what the patient needed to be on. Checked in the patients charts at the last few phone notes and advised her of responses below.    Elton Sin, LPN       6:77 AM  Note    Called and spoke with Patient. Wyn Quaker, NP/Dr [facility] recommendations given. Patient read back instructions given, and stated understanding.  Called and spoke with [place], [place].  Levaquin prescription and directions called in to pharmacy. [place] is aware of Patient instructions, hold Plaquenil and do not take Doxycycline.  Nothing further at this time.    Lauraine Rinne, NP  to [facility] Triage Pool . Juanito Doom, MD       9:19 AM  Note    Triage.     Please contact the patient and let them know that I have spoken with Dr. Lake Bells today.  The patient needs to go back on Levaquin as prescribed by Dr. Lake Bells. This is specifically due to the fact that her sputum culture grew out: Neisseria meningitidis.  Dr. Lake Bells has already discussed this with infectious disease.  Dr. Lake Bells would like for the patient to return back to Levaquin 750 mg daily for 5 days.  Please replace the order for Levaquin 750 mg daily for 5 days. Patient is to stop Plaquenil for the 5 days that she is taking her Levaquin. Do not take doxycycline.    Take the Levaquin as prescribed.  If you have questions please follow-up with our office.  Hold Plaquenil for the 5 days that you are taking the Levaquin.  Wyn Quaker, FNP     ________________________________________________________________________________________  Almyra Free is aware of this and verbalized understanding. Levaquin will be sent out to the patient and the doxycycline will be discontinued. Nothing further needed.

## 2018-03-09 NOTE — Addendum Note (Signed)
Addended by: Elton Sin on: 03/09/2018 09:37 AM   Modules accepted: Orders

## 2018-03-09 NOTE — Telephone Encounter (Signed)
            Spoke with pt.  She wanted to clarify that she was going to take the levaquin and hold the plaquenil and then restart the plaquenil when she ws done with the levaquin.  She also clarified that she is not going to take the doxycycline.  Nothing further is needed.

## 2018-03-09 NOTE — Telephone Encounter (Signed)
Called and spoke with Patient. Wyn Quaker, NP/Dr [facility] recommendations given. Patient read back instructions given, and stated understanding.  Called and spoke with [place], [place].  Levaquin prescription and directions called in to pharmacy. [place] is aware of Patient instructions, hold Plaquenil and do not take Doxycycline.  Nothing further at this time.

## 2018-03-24 ENCOUNTER — Other Ambulatory Visit: Payer: Self-pay | Admitting: Pulmonary Disease

## 2018-03-24 LAB — FUNGUS CULTURE W SMEAR
MICRO NUMBER:: 89587
SMEAR:: NONE SEEN
SPECIMEN QUALITY: ADEQUATE

## 2018-03-24 LAB — RESPIRATORY CULTURE OR RESPIRATORY AND SPUTUM CULTURE
MICRO NUMBER:: 89588
RESULT:: NORMAL
SPECIMEN QUALITY:: ADEQUATE

## 2018-04-13 LAB — AFB CULTURE WITH SMEAR (NOT AT ARMC)
Acid Fast Culture: NEGATIVE
Acid Fast Smear: NEGATIVE

## 2018-05-08 ENCOUNTER — Other Ambulatory Visit: Payer: Self-pay | Admitting: Pulmonary Disease

## 2018-06-01 ENCOUNTER — Ambulatory Visit: Payer: Medicare Other | Admitting: Pulmonary Disease

## 2018-07-23 ENCOUNTER — Other Ambulatory Visit: Payer: Self-pay | Admitting: Pulmonary Disease

## 2018-07-23 ENCOUNTER — Telehealth: Payer: Self-pay | Admitting: Pulmonary Disease

## 2018-07-23 NOTE — Telephone Encounter (Signed)
Returned call to patient.  Patient states she was denied a refill on singulair today due to needing an OV per pharmacy.  Patient is agreeable to scheduling a visit but called earlier this week and was told she needed to call back first week of July to get on the schedule for Dr. Lake Bells.  Patient states it is confusing to her.   Apologized to patient for the confusion.  Offered patient an appointment with an NP but patient states she would prefer to see Dr. Lake Bells due to she had several questions and she would prefer to discuss with him.  She states she is not in urgent need of anything and wants to have appointment with the MD.    Explained to patient that since she had not been seen in the office since January 2020 that this is why the refill had been denied.  Since patient is agreeable to appointment, advised we would send in 90 day supply today of the singulair but patient would need to have an appointment with a provider here before further refills, even if appointment needs to be scheduled with someone else.  Patient was agreeable.  Verified with pharmacy by phone that patient has no refills for singular and placed verbal order for #90 Singulair to [facility] per patient request.  Nothing further needed.

## 2018-07-27 ENCOUNTER — Other Ambulatory Visit: Payer: Self-pay | Admitting: Podiatry

## 2018-07-27 ENCOUNTER — Other Ambulatory Visit: Payer: Self-pay

## 2018-07-27 ENCOUNTER — Ambulatory Visit: Payer: Medicare Other | Admitting: Podiatry

## 2018-07-27 ENCOUNTER — Encounter: Payer: Self-pay | Admitting: Podiatry

## 2018-07-27 ENCOUNTER — Ambulatory Visit (INDEPENDENT_AMBULATORY_CARE_PROVIDER_SITE_OTHER): Payer: Medicare Other

## 2018-07-27 VITALS — Temp 98.0°F | Resp 16

## 2018-07-27 DIAGNOSIS — M67471 Ganglion, right ankle and foot: Secondary | ICD-10-CM

## 2018-07-27 DIAGNOSIS — M79671 Pain in right foot: Secondary | ICD-10-CM

## 2018-07-27 NOTE — Progress Notes (Signed)
Subjective:  Patient ID: Desiree [name], female    DOB: 1945-06-09,  MRN: [id number]  Chief Complaint  Patient presents with   Cyst    Rt dorsum below 4th toe x 4 wks; no pain/injury Tx: none -pt denies drainage/rendes/swelling    73 y.o. female presents with the above complaint. Hx above confirmed with patient.  Review of Systems: Negative except as noted in the HPI. Denies N/V/F/Ch.  Past Medical History:  Diagnosis Date   Cat allergies    COPD (chronic obstructive pulmonary disease) ([facility])    Hypertension    Osteoporosis    Pulmonary embolism ([place]) 2017   Rheumatoid arthritis(714.0)    Dr Tobie Lords follows    Current Outpatient Medications:    albuterol (PROAIR HFA) 108 (90 Base) MCG/ACT inhaler, Inhale 2 puffs into the lungs every 4 (four) hours as needed for wheezing or shortness of breath., Disp: , Rfl:    albuterol (PROVENTIL) (2.5 MG/3ML) 0.083% nebulizer solution, Take 3 mLs (2.5 mg total) by nebulization every 6 (six) hours as needed for wheezing or shortness of breath., Disp: 360 mL, Rfl: 11   alendronate (FOSAMAX) 70 MG tablet, Take 1 tablet (70 mg total) by mouth every 7 days., Disp: , Rfl:    amLODipine (NORVASC) 5 MG tablet, Take 5 mg by mouth daily., Disp: , Rfl:    ASPIRIN PO, Take 325 mg by mouth daily. , Disp: , Rfl:    Cholecalciferol (VITAMIN D3 PO), Take 1 tablet by mouth daily. , Disp: , Rfl:    Cyanocobalamin (VITAMIN B-12 PO), Take by mouth., Disp: , Rfl:    Fluticasone-Umeclidin-Vilant (TRELEGY ELLIPTA) 100-62.5-25 MCG/INH AEPB, Inhale 1 puff into the lungs daily., Disp: 1 each, Rfl: 0   hydroxychloroquine (PLAQUENIL) 200 MG tablet, 2 tabs twice daily, Disp: , Rfl: 3   irbesartan (AVAPRO) 300 MG tablet, Take 300 mg by mouth daily., Disp: , Rfl:    montelukast (SINGULAIR) 10 MG tablet, TAKE ONE TABLET BY MOUTH AT BEDTIME, Disp: 30 tablet, Rfl: 0   omeprazole (PRILOSEC) 20 MG capsule, TAKE ONE CAPSULE BY MOUTH EVERY DAY, Disp: 30  capsule, Rfl: 5   predniSONE (DELTASONE) 5 MG tablet, Take 5 mg by mouth daily with breakfast., Disp: , Rfl:    sertraline (ZOLOFT) 100 MG tablet, Take 1 tablet by mouth daily., Disp: , Rfl:    TRELEGY ELLIPTA 100-62.5-25 MCG/INH AEPB, INHALE 1 PUFF DAILY, Disp: 60 each, Rfl: 5   valsartan (DIOVAN) 320 MG tablet, Take 320 mg by mouth daily., Disp: , Rfl:   Social History   Tobacco Use  Smoking Status Former Smoker   Packs/day: 1.00   Years: 40.00   Pack years: 40.00   Types: Cigarettes   Quit date: 02/03/2001   Years since quitting: 17.4  Smokeless Tobacco Never Used    Allergies  Allergen Reactions   Other Shortness Of Breath   Objective:   Vitals:   07/27/18 1346  Resp: 16  Temp: 98 F (36.7 C)   There is no height or weight on file to calculate BMI. Constitutional Well developed. Well nourished.  Vascular Dorsalis pedis pulses palpable bilaterally. Posterior tibial pulses palpable bilaterally. Capillary refill normal to all digits.  No cyanosis or clubbing noted. Pedal hair growth normal.  Neurologic Normal speech. Oriented to person, place, and time. Epicritic sensation to light touch grossly present bilaterally.  Dermatologic Nails well groomed and normal in appearance. No open wounds. No skin lesions.  Orthopedic: Fluctuant mass overlying 4th extensor tendon  right foot.   Radiographs: Taken and reviewed no underlying osseous abnormality at area of concern Assessment:   1. Ganglion cyst of right foot   2. Pain in right foot    Plan:  Patient was evaluated and treated and all questions answered.  Ganglion Cyst R Foot -XR reviewed  -Educated on etiology -After local injection with 2 cc of lidocaine 1% plain, and sterile skin prep, Ganglion Cyst aspirated with mucinous contents noted. -Discussed possible excision should recurrence occur if painful.   No follow-ups on file.

## 2018-07-27 NOTE — Progress Notes (Signed)
   Subjective:    Patient ID: Desiree [name], female    DOB: 05/24/1945, 73 y.o.   MRN: [id number]  HPI    Review of Systems  All other systems reviewed and are negative.      Objective:   Physical Exam        Assessment & Plan:

## 2018-07-29 ENCOUNTER — Other Ambulatory Visit: Payer: Self-pay | Admitting: Podiatry

## 2018-07-29 DIAGNOSIS — M79671 Pain in right foot: Secondary | ICD-10-CM

## 2018-07-29 DIAGNOSIS — M67471 Ganglion, right ankle and foot: Secondary | ICD-10-CM

## 2018-09-07 ENCOUNTER — Ambulatory Visit: Payer: Medicare Other | Admitting: Podiatry

## 2018-09-07 ENCOUNTER — Other Ambulatory Visit: Payer: Self-pay | Admitting: Pulmonary Disease

## 2018-10-12 ENCOUNTER — Other Ambulatory Visit: Payer: Self-pay

## 2018-10-12 ENCOUNTER — Encounter: Payer: Self-pay | Admitting: Pulmonary Disease

## 2018-10-12 ENCOUNTER — Ambulatory Visit (INDEPENDENT_AMBULATORY_CARE_PROVIDER_SITE_OTHER): Payer: Medicare Other | Admitting: Pulmonary Disease

## 2018-10-12 VITALS — BP 132/84 | HR 113 | Temp 97.2°F | Ht 61.0 in | Wt 147.6 lb

## 2018-10-12 DIAGNOSIS — J449 Chronic obstructive pulmonary disease, unspecified: Secondary | ICD-10-CM

## 2018-10-12 DIAGNOSIS — R0609 Other forms of dyspnea: Secondary | ICD-10-CM

## 2018-10-12 DIAGNOSIS — R0600 Dyspnea, unspecified: Secondary | ICD-10-CM

## 2018-10-12 DIAGNOSIS — I7090 Unspecified atherosclerosis: Secondary | ICD-10-CM

## 2018-10-12 DIAGNOSIS — I2510 Atherosclerotic heart disease of native coronary artery without angina pectoris: Secondary | ICD-10-CM

## 2018-10-12 DIAGNOSIS — R918 Other nonspecific abnormal finding of lung field: Secondary | ICD-10-CM | POA: Diagnosis not present

## 2018-10-12 DIAGNOSIS — J479 Bronchiectasis, uncomplicated: Secondary | ICD-10-CM

## 2018-10-12 DIAGNOSIS — M069 Rheumatoid arthritis, unspecified: Secondary | ICD-10-CM

## 2018-10-12 MED ORDER — SPIRIVA RESPIMAT 2.5 MCG/ACT IN AERS: 2.0000 | INHALATION_SPRAY | Freq: Every day | RESPIRATORY_TRACT | 0 refills | Status: DC

## 2018-10-12 MED ORDER — BUDESONIDE-FORMOTEROL FUMARATE 160-4.5 MCG/ACT IN AERO: 2.0000 | INHALATION_SPRAY | Freq: Two times a day (BID) | RESPIRATORY_TRACT | 0 refills | Status: DC

## 2018-10-12 NOTE — Patient Instructions (Addendum)
Thank you for visiting Dr. Valeta Harms at [facility]. Today we recommend the following:  Orders Placed This Encounter  Procedures  . CT CHEST HIGH RESOLUTION  . Ambulatory referral to Cardiology  . Pulmonary Function Test   Meds ordered this encounter  Medications  . budesonide-formoterol (SYMBICORT) 160-4.5 MCG/ACT inhaler    Sig: Inhale 2 puffs into the lungs every 12 (twelve) hours.    Dispense:  1 Inhaler    Refill:  0  . Tiotropium Bromide Monohydrate (SPIRIVA RESPIMAT) 2.5 MCG/ACT AERS    Sig: Inhale 2 puffs into the lungs daily.    Dispense:  1 g    Refill:  0    STOP TRELEGY   Return in about 6 weeks (around 11/23/2018) for with APP or Dr. Valeta Harms.    Please do your part to reduce the spread of COVID-19.

## 2018-10-12 NOTE — Progress Notes (Signed)
Synopsis: Referred in September 2020 for establish care with new pulmonologist former patient of Dr. Lake Bells, PCP: Raina Mina., MD  Subjective:   PATIENT ID: Desiree [name] DOB: 1945-04-14, MRN: [id number]  Chief Complaint  Patient presents with  . Consult    [facility] patient of BQ. Here to get established. She reports increased SOB. She reports doing simple things like taking a shower requires long rest breaks due to SOB. Trelegy daily, nebulizers prn.     73 year old [name] past medical history of COPD, pulmonary embolism, rheumatoid arthritis, 5 mg prednisone plus Plaquenil, former patient of Dr. Lake Bells.  COPD currently managed with Trelegy.  Has had several images following lung nodules since 2017.  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  PET scan July 2019 showed FDG activity SUV 1.348 mm left lower lobe nodule.  IgE 158, Rast panel positive for cat dander, dog dander and Aspergillus fumigate [place].  Last evaluation included sputum cultures, fungus AFB.  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  Appears patient had exacerbation in February and was treated with Levaquin.  As for her RA she is currently on Plaquenil.  Overall she has seen a significant decline in her physical ability.  She states that she is really noticed a significant increase in her dyspnea over the past several months.  Otherwise her respiratory complaints related to her COPD have been stable.  She uses her Trelegy inhaler regularly.  She occasionally describes a central "abnormal feeling in her chest with exertion but most of the time this all dissipates with resting.  She states even the time yes tasks make her severely fatigued as come paired to previous.  Patient denies fevers chills weight loss hemoptysis.   Past Medical History:  Diagnosis Date  . Cat allergies   . COPD (chronic  obstructive pulmonary disease) ([place])   . Hypertension   . Osteoporosis   . Pulmonary embolism ([place]) 2017  . Rheumatoid arthritis(714.0)    Dr Tobie Lords follows     Family History  Problem Relation Age of Onset  . Heart disease Father   . Stroke Mother   . Clotting disorder Mother   . Heart disease Sister   . Sleep apnea Sister   . Emphysema Sister   . Breast cancer Maternal Aunt   . Emphysema Paternal Grandmother      Past Surgical History:  Procedure Laterality Date  . CYSTECTOMY Right    breast    Social History   Socioeconomic History  . Marital status: Widowed    Spouse name: Not on file  . Number of children: Not on file  . Years of education: Not on file  . Highest education level: Not on file  Occupational History  . Occupation: retired  Scientific laboratory technician  . Financial resource strain: Not on file  . Food insecurity    Worry: Not on file    Inability: Not on file  . Transportation needs    Medical: Not on file    Non-medical: Not on file  Tobacco Use  . Smoking status: Former Smoker    Packs/day: 1.00    Years: 40.00    Pack years: 40.00    Types: Cigarettes    Quit date: 02/03/2001    Years since quitting: 17.6  . Smokeless tobacco: Never Used  Substance and Sexual Activity  . Alcohol use: Yes    Alcohol/week:  2.0 standard drinks    Types: 2 Standard drinks or equivalent per week    Comment: 2 drinks daily  . Drug use: No  . Sexual activity: Not on file  Lifestyle  . Physical activity    Days per week: Not on file    Minutes per session: Not on file  . Stress: Not on file  Relationships  . Social Herbalist on phone: Not on file    Gets together: Not on file    Attends religious service: Not on file    Active member of club or organization: Not on file    Attends meetings of clubs or organizations: Not on file    Relationship status: Not on file  . Intimate partner violence    Fear of current or ex partner: Not on file     Emotionally abused: Not on file    Physically abused: Not on file    Forced sexual activity: Not on file  Other Topics Concern  . Not on file  Social History Narrative   Tobacco use, amount per day now: NONE   Past tobacco use, amount per day: 1 PACK PER DAY   How many years did you use tobacco:TOO MANY STOPPED IN 2003   Alcohol use (drinks per week): 14   Diet: GOOD   Do you drink/eat things with caffeine: YES   Marital status: WIDOW                What year were you married? 1967   Do you live in a house, apartment, assisted living, condo, trailer, etc.? HOUSE   Is it one or more stories? SPLIT LEVEL   How many persons live in your home? 1   Do you have pets in your home?( please list) NONE   Current or past profession: [employer]   Do you exercise?  NO                                Type and how often?   Do you have a living will? YES    Do you have a DNR form?  YES                                 If not, do you want to discuss one?   Do you have signed POA/HPOA forms?        ??              If so, please bring to you appointment      [facility] (05/16/16):   Originally from [place], [place]. 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 No pets currently. No bird, mold, or hot tub exposure.      Allergies  Allergen Reactions  . Other Shortness Of Breath     Outpatient Medications Prior to Visit  Medication Sig Dispense Refill  . albuterol (PROAIR HFA) 108 (90 Base) MCG/ACT inhaler Inhale 2 puffs into the lungs every 4 (four) hours as needed for wheezing or shortness of breath.    Marland Kitchen albuterol (PROVENTIL) (2.5 MG/3ML) 0.083% nebulizer solution Take 3 mLs (2.5 mg total) by nebulization every 6 (six) hours as needed for wheezing or shortness of breath. 360 mL 11  . alendronate (FOSAMAX) 70 MG tablet Take 1 tablet (70 mg  total) by mouth every 7 days.    Marland Kitchen amLODipine (NORVASC) 5 MG tablet Take 5 mg by mouth daily.     . ASPIRIN PO Take 325 mg by mouth daily.     . Cholecalciferol (VITAMIN D3 PO) Take 1 tablet by mouth daily.     . Cyanocobalamin (VITAMIN B-12 PO) Take by mouth.    . desloratadine (CLARINEX) 5 MG tablet Take 5 mg by mouth daily.    . fexofenadine (ALLEGRA) 180 MG tablet Take 180 mg by mouth daily.    . hydroxychloroquine (PLAQUENIL) 200 MG tablet 2 tabs twice daily  3  . irbesartan (AVAPRO) 300 MG tablet Take 300 mg by mouth daily.    . montelukast (SINGULAIR) 10 MG tablet TAKE ONE TABLET BY MOUTH AT BEDTIME 30 tablet 0  . omeprazole (PRILOSEC) 20 MG capsule TAKE ONE CAPSULE BY MOUTH EVERY DAY 30 capsule 1  . predniSONE (DELTASONE) 5 MG tablet Take 5 mg by mouth daily with breakfast.    . Quercetin 250 MG TABS Take 500 mg by mouth daily.    . sertraline (ZOLOFT) 100 MG tablet Take 1 tablet by mouth daily.    . TRELEGY ELLIPTA 100-62.5-25 MCG/INH AEPB INHALE 1 PUFF DAILY 60 each 5  . valsartan (DIOVAN) 320 MG tablet Take 320 mg by mouth daily.    . Fluticasone-Umeclidin-Vilant (TRELEGY ELLIPTA) 100-62.5-25 MCG/INH AEPB Inhale 1 puff into the lungs daily. (Patient not taking: Reported on 10/12/2018) 1 each 0   No facility-administered medications prior to visit.     Review of Systems  Constitutional: Positive for malaise/fatigue. Negative for chills, fever and weight loss.  HENT: Negative for hearing loss, sore throat and tinnitus.   Eyes: Negative for blurred vision and double vision.  Respiratory: Positive for shortness of breath. Negative for cough, hemoptysis, sputum production, wheezing and stridor.   Cardiovascular: Positive for chest pain. Negative for palpitations, orthopnea, leg swelling and PND.  Gastrointestinal: Negative for abdominal pain, constipation, diarrhea, heartburn, nausea and vomiting.  Genitourinary: Negative for dysuria, hematuria and urgency.  Musculoskeletal: Negative for joint pain and myalgias.  Skin: Negative for itching and rash.  Neurological: Negative for  dizziness, tingling, weakness and headaches.  Endo/Heme/Allergies: Negative for environmental allergies. Does not bruise/bleed easily.  Psychiatric/Behavioral: Negative for depression. The patient is not nervous/anxious and does not have insomnia.   All other systems reviewed and are negative.    Objective:  Physical Exam Vitals signs reviewed.  Constitutional:      General: She is not in acute distress.    Appearance: She is well-developed.  HENT:     Head: Normocephalic and atraumatic.  Eyes:     General: No scleral icterus.    Conjunctiva/sclera: Conjunctivae normal.     Pupils: Pupils are equal, round, and reactive to light.  Neck:     Musculoskeletal: Neck supple.     Vascular: No JVD.     Trachea: No tracheal deviation.  Cardiovascular:     Rate and Rhythm: Normal rate and regular rhythm.     Heart sounds: Normal heart sounds. No murmur.  Pulmonary:     Effort: Pulmonary effort is normal. No tachypnea, accessory muscle usage or respiratory distress.     Breath sounds: Normal breath sounds. No stridor. No wheezing, rhonchi or rales.  Abdominal:     General: Bowel sounds are normal. There is no distension.     Palpations: Abdomen is soft.     Tenderness: There is no abdominal tenderness.  Musculoskeletal:  General: No tenderness.  Lymphadenopathy:     Cervical: No cervical adenopathy.  Skin:    General: Skin is warm and dry.     Capillary Refill: Capillary refill takes less than 2 seconds.     Findings: No rash.  Neurological:     Mental Status: She is alert and oriented to person, place, and time.  Psychiatric:        Behavior: Behavior normal.      Vitals:   10/12/18 1156  BP: 132/84  Pulse: (!) 113  Temp: (!) 97.2 F (36.2 C)  TempSrc: Temporal  SpO2: 96%  Weight: 147 lb 9.6 oz (67 kg)  Height: 5\' 1"  (1.549 m)   96% on RA BMI Readings from Last 3 Encounters:  10/12/18 27.89 kg/m  02/24/18 27.21 kg/m  09/09/17 26.83 kg/m   Wt Readings  from Last 3 Encounters:  10/12/18 147 lb 9.6 oz (67 kg)  02/24/18 144 lb (65.3 kg)  09/09/17 142 lb (64.4 kg)     CBC    Component Value Date/Time   WBC 6.4 10/03/2015 1512   WBC 3.4 (L) 11/24/2014 0029   RBC 3.77 10/03/2015 1512   RBC 3.09 (L) 11/24/2014 0029   HGB 12.1 10/03/2015 1512   HCT 36.7 10/03/2015 1512   PLT 349 10/03/2015 1512   MCV 97 10/03/2015 1512   MCH 32.1 10/03/2015 1512   MCH 32.7 11/24/2014 0029   MCHC 33.0 10/03/2015 1512   MCHC 33.9 11/24/2014 0029   RDW 14.0 10/03/2015 1512   LYMPHSABS 0.6 (L) 10/03/2015 1512   MONOABS 1.2 (H) 11/24/2014 0029   EOSABS 0.2 10/03/2015 1512   BASOSABS 0.0 10/03/2015 1512    Chest Imaging: CT chest 03/03/2017: Evidence of infectious bronchiolitis, 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  Moderate emphysema. The patient's images have been independently reviewed by me.    Pulmonary Functions Testing Results: PFT Results Latest Ref Rng & Units 09/05/2016  FVC-Pre L 1.71  FVC-Predicted Pre % 67  FVC-Post L 1.75  FVC-Predicted Post % 69  Pre FEV1/FVC % % 66  Post FEV1/FCV % % 69  FEV1-Pre L 1.13  FEV1-Predicted Pre % 59  FEV1-Post L 1.21  DLCO UNC% % 66  DLCO COR %Predicted % 86  TLC L 4.77  TLC % Predicted % 103  RV % Predicted % 128    FeNO: None   Pathology: None  Echocardiogram: None   Heart Catheterization: None     Assessment & Plan:     ICD-10-CM   1. DOE (dyspnea on exertion)  R06.09   2. Chronic obstructive pulmonary disease, unspecified COPD type ([place])  J44.9 CT CHEST HIGH RESOLUTION    Pulmonary Function Test  3. Multiple lung nodules on CT  R91.8 CT CHEST HIGH RESOLUTION    Pulmonary Function Test  4. Abnormal findings on diagnostic imaging of lung  R91.8   5. Bronchiectasis without complication ([place])  P38.2   6. Atherosclerosis  I70.90   7. Coronary artery disease involving native heart without angina pectoris, unspecified vessel or  lesion type  I25.10   8. Rheumatoid arthritis, involving unspecified site, unspecified rheumatoid factor presence ([place])  M06.9 Ambulatory referral to Cardiology    Discussion:  This is a 73 year old [name] with progressive dyspnea on exertion.  Past medical history of COPD, former smoker, RA, CT with multiple lung nodules.  CT imaging also revealed coronary disease on LAD.  She is not had any previous cardiac evaluation.  She has had progressive dyspnea despite triple therapy management of her COPD and she only has moderate reduction in her FEV1.  Plan: I think we should start with a multifaceted evaluation. Due to her RA history and previous interstitial type changes on last CT scan we will obtain a high-resolution CT scan of the chest to evaluate for any progressive parenchymal disease.  The CT scan of the chest will also allow further evaluation of the multiple lung nodules seen.  These could very well represent rheumatoid nodules however we should document that they are stable.  If there is progressive disease we may need to consider further diagnostic evaluation due to her history of smoking. She should continue her 5 mg of prednisone and Plaquenil per rheumatology.  We will also send her to cardiology for evaluation of dyspnea on exertion.  As well as the LAD atherosclerotic disease seen on CT.  Question whether or not she would benefit from stress testing for further evaluation.  She is currently managed with a dry powder inhaler.  We will switch from a DPI to an MDI plus mist combination to see if this makes any difference for drug delivery mechanisms.  If she likes the samples of Symbicort plus Spiriva Respimat we can continue this if not she can go back to the use of Trelegy.  We will also have full pulmonary function test complete and prior to the next office visit.  Patient to return to clinic in 6 weeks preferably after cardiac evaluation, CT scan and pulmonary function tests.   Greater than 50% of this patient's 45-minute of visit was spent face-to-face discussing the above recommendations, diagnostic evaluation, referrals and treatment plan.  We also reviewed the patient's previous CT imaging today in the office.   Current Outpatient Medications:  .  albuterol (PROAIR HFA) 108 (90 Base) MCG/ACT inhaler, Inhale 2 puffs into the lungs every 4 (four) hours as needed for wheezing or shortness of breath., Disp: , Rfl:  .  albuterol (PROVENTIL) (2.5 MG/3ML) 0.083% nebulizer solution, Take 3 mLs (2.5 mg total) by nebulization every 6 (six) hours as needed for wheezing or shortness of breath., Disp: 360 mL, Rfl: 11 .  alendronate (FOSAMAX) 70 MG tablet, Take 1 tablet (70 mg total) by mouth every 7 days., Disp: , Rfl:  .  amLODipine (NORVASC) 5 MG tablet, Take 5 mg by mouth daily., Disp: , Rfl:  .  ASPIRIN PO, Take 325 mg by mouth daily. , Disp: , Rfl:  .  Cholecalciferol (VITAMIN D3 PO), Take 1 tablet by mouth daily. , Disp: , Rfl:  .  Cyanocobalamin (VITAMIN B-12 PO), Take by mouth., Disp: , Rfl:  .  desloratadine (CLARINEX) 5 MG tablet, Take 5 mg by mouth daily., Disp: , Rfl:  .  fexofenadine (ALLEGRA) 180 MG tablet, Take 180 mg by mouth daily., Disp: , Rfl:  .  hydroxychloroquine (PLAQUENIL) 200 MG tablet, 2 tabs twice daily, Disp: , Rfl: 3 .  irbesartan (AVAPRO) 300 MG tablet, Take 300 mg by mouth daily., Disp: , Rfl:  .  montelukast (SINGULAIR) 10 MG tablet, TAKE ONE TABLET BY MOUTH AT BEDTIME, Disp: 30 tablet, Rfl: 0 .  omeprazole (PRILOSEC) 20 MG capsule, TAKE ONE CAPSULE BY MOUTH EVERY DAY, Disp: 30 capsule, Rfl: 1 .  predniSONE (DELTASONE) 5 MG tablet, Take 5 mg by mouth daily with breakfast., Disp: , Rfl:  .  Quercetin 250 MG TABS, Take 500 mg by mouth daily., Disp: , Rfl:  .  sertraline (ZOLOFT)  100 MG tablet, Take 1 tablet by mouth daily., Disp: , Rfl:  .  TRELEGY ELLIPTA 100-62.5-25 MCG/INH AEPB, INHALE 1 PUFF DAILY, Disp: 60 each, Rfl: 5 .  valsartan (DIOVAN)  320 MG tablet, Take 320 mg by mouth daily., Disp: , Rfl:    Garner Nash, DO [facility] 10/12/2018 12:01 PM

## 2018-10-15 ENCOUNTER — Encounter: Payer: Self-pay | Admitting: Pulmonary Disease

## 2018-10-21 DIAGNOSIS — F1020 Alcohol dependence, uncomplicated: Secondary | ICD-10-CM

## 2018-10-21 HISTORY — DX: Alcohol dependence, uncomplicated: F10.20

## 2018-10-25 ENCOUNTER — Ambulatory Visit (INDEPENDENT_AMBULATORY_CARE_PROVIDER_SITE_OTHER): Payer: Medicare Other | Admitting: Cardiology

## 2018-10-25 ENCOUNTER — Encounter: Payer: Self-pay | Admitting: Cardiology

## 2018-10-25 ENCOUNTER — Other Ambulatory Visit: Payer: Self-pay

## 2018-10-25 VITALS — BP 160/84 | HR 97 | Ht 61.0 in | Wt 149.2 lb

## 2018-10-25 DIAGNOSIS — R0602 Shortness of breath: Secondary | ICD-10-CM | POA: Diagnosis not present

## 2018-10-25 DIAGNOSIS — Z01812 Encounter for preprocedural laboratory examination: Secondary | ICD-10-CM | POA: Diagnosis not present

## 2018-10-25 DIAGNOSIS — I10 Essential (primary) hypertension: Secondary | ICD-10-CM

## 2018-10-25 DIAGNOSIS — M069 Rheumatoid arthritis, unspecified: Secondary | ICD-10-CM | POA: Diagnosis not present

## 2018-10-25 NOTE — Patient Instructions (Signed)
Medication Instructions:  Your physician recommends that you continue on your current medications as directed. Please refer to the Current Medication list given to you today.  If you need a refill on your cardiac medications before your next appointment, please call your pharmacy.   Lab work: Your physician recommends that you return for lab work in:   3-7 days prior to CT:BMP  If you have labs (blood work) drawn today and your tests are completely normal, you will receive your results only by: Marland Kitchen MyChart Message (if you have MyChart) OR . A paper copy in the mail If you have any lab test that is abnormal or we need to change your treatment, we will call you to review the results.  Testing/Procedures: Your physician has requested that you have an echocardiogram. Echocardiography is a painless test that uses sound waves to create images of your heart. It provides your doctor with information about the size and shape of your heart and how well your heart's chambers and valves are working. This procedure takes approximately one hour. There are no restrictions for this procedure.  Your physician has requested that you have cardiac CT. Cardiac computed tomography (CT) is a painless test that uses an x-ray machine to take clear, detailed pictures of your heart. For further information please visit [email or web address]. Please follow instruction sheet as given.   Your cardiac CT will be scheduled at one of the below locations:   [facility] [address] [place], [place] [address] [phone]  If scheduled at [facility], please arrive at the [facility] main entrance of [facility] 30-45 minutes prior to test start time. Proceed to the [facility] Radiology Department (first floor) to check-in and test prep.    Please follow these instructions carefully (unless otherwise directed):  On the Night Before the Test: . Be sure to Drink plenty of water. . Do not  consume any caffeinated/decaffeinated beverages or chocolate 12 hours prior to your test. . Do not take any antihistamines 12 hours prior to your test.  On the Day of the Test: . Drink plenty of water. Do not drink any water within one hour of the test. . Do not eat any food 4 hours prior to the test. . You may take your regular medications prior to the test.  . No beta blocker due to prednisone and active COPD . FEMALES- please wear underwire-free bra if available     Do not give Lopressor to patients with an allergy to lopressor or anyone with asthma or active COPD symptoms (currently taking steroids).       After the Test: . Drink plenty of water. . After receiving IV contrast, you may experience a mild flushed feeling. This is normal. . On occasion, you may experience a mild rash up to 24 hours after the test. This is not dangerous. If this occurs, you can take Benadryl 25 mg and increase your fluid intake. . If you experience trouble breathing, this can be serious. If it is severe call 911 IMMEDIATELY. If it is mild, please call our office..    Please contact the cardiac imaging nurse navigator should you have any questions/concerns Marchia Bond, RN Navigator Cardiac Imaging [facility] [phone] Office  [phone] Cell    Follow-Up: At [facility], you and your health needs are our priority.  As part of our continuing mission to provide you with exceptional heart care, we have created designated Provider Care  Teams.  These Care Teams include your primary Cardiologist (physician) and Advanced Practice Providers (APPs -  Physician Assistants and Nurse Practitioners) who all work together to provide you with the care you need, when you need it. .   Any Other Special Instructions Will Be Listed Below (If Applicable).   Echocardiogram An echocardiogram is a procedure that uses painless sound waves (ultrasound) to produce an image of the heart.  Images from an echocardiogram can provide important information about:  Signs of coronary artery disease (CAD).  Aneurysm detection. An aneurysm is a weak or damaged part of an artery wall that bulges out from the normal force of blood pumping through the body.  Heart size and shape. Changes in the size or shape of the heart can be associated with certain conditions, including heart failure, aneurysm, and CAD.  Heart muscle function.  Heart valve function.  Signs of a past heart attack.  Fluid buildup around the heart.  Thickening of the heart muscle.  A tumor or infectious growth around the heart valves. Tell a health care provider about:  Any allergies you have.  All medicines you are taking, including vitamins, herbs, eye drops, creams, and over-the-counter medicines.  Any blood disorders you have.  Any surgeries you have had.  Any medical conditions you have.  Whether you are pregnant or may be pregnant. What are the risks? Generally, this is a safe procedure. However, problems may occur, including:  Allergic reaction to dye (contrast) that may be used during the procedure. What happens before the procedure? No specific preparation is needed. You may eat and drink normally. What happens during the procedure?   An IV tube may be inserted into one of your veins.  You may receive contrast through this tube. A contrast is an injection that improves the quality of the pictures from your heart.  A gel will be applied to your chest.  A wand-like tool (transducer) will be moved over your chest. The gel will help to transmit the sound waves from the transducer.  The sound waves will harmlessly bounce off of your heart to allow the heart images to be captured in real-time motion. The images will be recorded on a computer. The procedure may vary among health care providers and hospitals. What happens after the procedure?  You may return to your normal, everyday life,  including diet, activities, and medicines, unless your health care provider tells you not to do that. Summary  An echocardiogram is a procedure that uses painless sound waves (ultrasound) to produce an image of the heart.  Images from an echocardiogram can provide important information about the size and shape of your heart, heart muscle function, heart valve function, and fluid buildup around your heart.  You do not need to do anything to prepare before this procedure. You may eat and drink normally.  After the echocardiogram is completed, you may return to your normal, everyday life, unless your health care provider tells you not to do that. This information is not intended to replace advice given to you by your health care provider. Make sure you discuss any questions you have with your health care provider. Document Released: 01/18/2000 Document Revised: 05/13/2018 Document Reviewed: 02/23/2016 Elsevier Patient Education  2020 [place].   Cardiac CT Angiogram  A cardiac CT angiogram is a procedure to look at the heart and the area around the heart. It may be done to help find the cause of chest pains or other symptoms of heart  disease. During this procedure, a large X-ray machine, called a CT scanner, takes detailed pictures of the heart and the surrounding area after a dye (contrast material) has been injected into blood vessels in the area. The procedure is also sometimes called a coronary CT angiogram, coronary artery scanning, or CTA. A cardiac CT angiogram allows the health care provider to see how well blood is flowing to and from the heart. The health care provider will be able to see if there are any problems, such as:  Blockage or narrowing of the coronary arteries in the heart.  Fluid around the heart.  Signs of weakness or disease in the muscles, valves, and tissues of the heart. Tell a health care provider about:  Any allergies you have. This is especially important if  you have had a previous allergic reaction to contrast dye.  All medicines you are taking, including vitamins, herbs, eye drops, creams, and over-the-counter medicines.  Any blood disorders you have.  Any surgeries you have had.  Any medical conditions you have.  Whether you are pregnant or may be pregnant.  Any anxiety disorders, chronic pain, or other conditions you have that may increase your stress or prevent you from lying still. What are the risks? Generally, this is a safe procedure. However, problems may occur, including:  Bleeding.  Infection.  Allergic reactions to medicines or dyes.  Damage to other structures or organs.  Kidney damage from the dye or contrast that is used.  Increased risk of cancer from radiation exposure. This risk is low. Talk with your health care provider about: ? The risks and benefits of testing. ? How you can receive the lowest dose of radiation. What happens before the procedure?  Wear comfortable clothing and remove any jewelry, glasses, dentures, and hearing aids.  Follow instructions from your health care provider about eating and drinking. This may include: ? For 12 hours before the test - avoid caffeine. This includes tea, coffee, soda, energy drinks, and diet pills. Drink plenty of water or other fluids that do not have caffeine in them. Being well-hydrated can prevent complications. ? For 4-6 hours before the test - stop eating and drinking. The contrast dye can cause nausea, but this is less likely if your stomach is empty.  Ask your health care provider about changing or stopping your regular medicines. This is especially important if you are taking diabetes medicines, blood thinners, or medicines to treat erectile dysfunction. What happens during the procedure?  Hair on your chest may need to be removed so that small sticky patches called electrodes can be placed on your chest. These will transmit information that helps to monitor  your heart during the test.  An IV tube will be inserted into one of your veins.  You might be given a medicine to control your heart rate during the test. This will help to ensure that good images are obtained.  You will be asked to lie on an exam table. This table will slide in and out of the CT machine during the procedure.  Contrast dye will be injected into the IV tube. You might feel warm, or you may get a metallic taste in your mouth.  You will be given a medicine (nitroglycerin) to relax (dilate) the arteries in your heart.  The table that you are lying on will move into the CT machine tunnel for the scan.  The person running the machine will give you instructions while the scans are being done. You  may be asked to: ? Keep your arms above your head. ? Hold your breath. ? Stay very still, even if the table is moving.  When the scanning is complete, you will be moved out of the machine.  The IV tube will be removed. The procedure may vary among health care providers and hospitals. What happens after the procedure?  You might feel warm, or you may get a metallic taste in your mouth from the contrast dye.  You may have a headache from the nitroglycerin.  After the procedure, drink water or other fluids to wash (flush) the contrast material out of your body.  Contact a health care provider if you have any symptoms of allergy to the contrast. These symptoms include: ? Shortness of breath. ? Rash or hives. ? A racing heartbeat.  Most people can return to their normal activities right after the procedure. Ask your health care provider what activities are safe for you.  It is up to you to get the results of your procedure. Ask your health care provider, or the department that is doing the procedure, when your results will be ready. Summary  A cardiac CT angiogram is a procedure to look at the heart and the area around the heart. It may be done to help find the cause of chest  pains or other symptoms of heart disease.  During this procedure, a large X-ray machine, called a CT scanner, takes detailed pictures of the heart and the surrounding area after a dye (contrast material) has been injected into blood vessels in the area.  Ask your health care provider about changing or stopping your regular medicines before the procedure. This is especially important if you are taking diabetes medicines, blood thinners, or medicines to treat erectile dysfunction.  After the procedure, drink water or other fluids to wash (flush) the contrast material out of your body. This information is not intended to replace advice given to you by your health care provider. Make sure you discuss any questions you have with your health care provider. Document Released: 01/03/2008 Document Revised: 01/02/2017 Document Reviewed: 12/10/2015 Elsevier Patient Education  2020 [employer].

## 2018-10-25 NOTE — Progress Notes (Signed)
Cardiology Office Note:    Date:  10/26/2018   ID:  Desiree [name], [place] 1945/04/15, MRN [id number]  PCP:  Raina Mina., MD  Cardiologist:  No primary care provider on file.  Electrophysiologist:  None   Referring MD: Garner Nash, DO   The patient was referred by primary care doctor.  History of Present Illness:    Desiree [name] is a 73 y.o. female with a hx of hypertension, COPD, presents to be evaluated for several months of generalized fatigue and shortness of breath.  She notes that over the past several months she has been increasingly having shortness of breath.  Most recently she gets generally tired and has no interest in doing anything.  She reports that even activities like taking a shower makes her worn out leading her to lay in the bed for a while before regrouping to do anything else.  She does not like how she feels, because this is not her usual self.  She denies any chest pain, lightheadedness, dizziness. She is concerned because she does have significant coronary artery disease in her family.  Past Medical History:  Diagnosis Date  . Cat allergies   . COPD (chronic obstructive pulmonary disease) ([place])   . Hypertension   . Osteoporosis   . Pulmonary embolism ([place]) 2017  . Rheumatoid arthritis(714.0)    Dr Tobie Lords follows    Past Surgical History:  Procedure Laterality Date  . CYSTECTOMY Right    breast    Current Medications: Current Meds  Medication Sig  . albuterol (PROAIR HFA) 108 (90 Base) MCG/ACT inhaler Inhale 2 puffs into the lungs every 4 (four) hours as needed for wheezing or shortness of breath.  Marland Kitchen albuterol (PROVENTIL) (2.5 MG/3ML) 0.083% nebulizer solution Take 3 mLs (2.5 mg total) by nebulization every 6 (six) hours as needed for wheezing or shortness of breath.  Marland Kitchen alendronate (FOSAMAX) 70 MG tablet Take 1 tablet (70 mg total) by mouth every 7 days.  Marland Kitchen amLODipine (NORVASC) 5 MG tablet Take 5 mg by mouth daily.  . ASPIRIN PO  Take 325 mg by mouth daily.   . budesonide-formoterol (SYMBICORT) 160-4.5 MCG/ACT inhaler Inhale 2 puffs into the lungs every 12 (twelve) hours.  . Cholecalciferol (VITAMIN D3 PO) Take 1 tablet by mouth daily.   . Cyanocobalamin (VITAMIN B-12 PO) Take by mouth.  . desloratadine (CLARINEX) 5 MG tablet Take 5 mg by mouth daily.  . fexofenadine (ALLEGRA) 180 MG tablet Take 180 mg by mouth daily.  . hydroxychloroquine (PLAQUENIL) 200 MG tablet 2 tabs twice daily  . irbesartan (AVAPRO) 300 MG tablet Take 300 mg by mouth daily.  . montelukast (SINGULAIR) 10 MG tablet TAKE ONE TABLET BY MOUTH AT BEDTIME  . omeprazole (PRILOSEC) 20 MG capsule TAKE ONE CAPSULE BY MOUTH EVERY DAY  . predniSONE (DELTASONE) 5 MG tablet Take 5 mg by mouth daily with breakfast.  . Quercetin 250 MG TABS Take 500 mg by mouth daily.  . sertraline (ZOLOFT) 100 MG tablet Take 1 tablet by mouth daily.  . Tiotropium Bromide Monohydrate (SPIRIVA RESPIMAT) 2.5 MCG/ACT AERS Inhale 2 puffs into the lungs daily.  . valsartan (DIOVAN) 320 MG tablet Take 320 mg by mouth daily.     Allergies:   Other   Social History   Socioeconomic History  . Marital status: Widowed    Spouse name: Not on file  . Number of children: Not on file  . Years of education: Not on file  .  Highest education level: Not on file  Occupational History  . Occupation: retired  Scientific laboratory technician  . Financial resource strain: Not on file  . Food insecurity    Worry: Not on file    Inability: Not on file  . Transportation needs    Medical: Not on file    Non-medical: Not on file  Tobacco Use  . Smoking status: Former Smoker    Packs/day: 1.00    Years: 40.00    Pack years: 40.00    Types: Cigarettes    Quit date: 02/03/2001    Years since quitting: 17.7  . Smokeless tobacco: Never Used  Substance and Sexual Activity  . Alcohol use: Yes    Alcohol/week: 2.0 standard drinks    Types: 2 Standard drinks or equivalent per week    Comment: 2 drinks daily   . Drug use: No  . Sexual activity: Not on file  Lifestyle  . Physical activity    Days per week: Not on file    Minutes per session: Not on file  . Stress: Not on file  Relationships  . Social Herbalist on phone: Not on file    Gets together: Not on file    Attends religious service: Not on file    Active member of club or organization: Not on file    Attends meetings of clubs or organizations: Not on file    Relationship status: Not on file  Other Topics Concern  . Not on file  Social History Narrative   Tobacco use, amount per day now: NONE   Past tobacco use, amount per day: 1 PACK PER DAY   How many years did you use tobacco:TOO MANY STOPPED IN 2003   Alcohol use (drinks per week): 14   Diet: GOOD   Do you drink/eat things with caffeine: YES   Marital status: WIDOW                What year were you married? 1967   Do you live in a house, apartment, assisted living, condo, trailer, etc.? HOUSE   Is it one or more stories? SPLIT LEVEL   How many persons live in your home? 1   Do you have pets in your home?( please list) NONE   Current or past profession: [employer]   Do you exercise?  NO                                Type and how often?   Do you have a living will? YES    Do you have a DNR form?  YES                                 If not, do you want to discuss one?   Do you have signed POA/HPOA forms?        ??              If so, please bring to you appointment      [facility] (05/16/16):   Originally from [place], [place]. 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 No pets currently. No bird, mold, or hot tub exposure.      Family History: The patient's family history includes Breast cancer in her maternal aunt; Clotting disorder in her  mother; Emphysema in her paternal grandmother and sister; Heart disease in her father and sister; Sleep apnea in her sister; Stroke in her mother.  ROS:    Review of Systems  Constitution: Negative for decreased appetite, fever and weight gain.  HENT: Negative for congestion, ear discharge, hoarse voice and sore throat.   Eyes: Negative for discharge, redness, vision loss in right eye and visual halos.  Cardiovascular: Negative for chest pain, dyspnea on exertion, leg swelling, orthopnea and palpitations.  Respiratory: Negative for cough, hemoptysis, shortness of breath and snoring.   Endocrine: Negative for heat intolerance and polyphagia.  Hematologic/Lymphatic: Negative for bleeding problem. Does not bruise/bleed easily.  Skin: Negative for flushing, nail changes, rash and suspicious lesions.  Musculoskeletal: Negative for arthritis, joint pain, muscle cramps, myalgias, neck pain and stiffness.  Gastrointestinal: Negative for abdominal pain, bowel incontinence, diarrhea and excessive appetite.  Genitourinary: Negative for decreased libido, genital sores and incomplete emptying.  Neurological: Negative for brief paralysis, focal weakness, headaches and loss of balance.  Psychiatric/Behavioral: Negative for altered mental status, depression and suicidal ideas.  Allergic/Immunologic: Negative for HIV exposure and persistent infections.    EKGs/Labs/Other Studies Reviewed:    The following studies were reviewed today:   EKG:  The ekg ordered today demonstrates sinus rhythm, heart rate 97 bpm RSR prime, with poor progression in the precordial leads cannot rule out anterior infarction cannot rule out lateral wall infarction.  Similar compared to EKG performed in 2016 however at that time patient was with sinus tachycardia.   Recent Labs: No results found for requested labs within last 8760 hours.  Recent Lipid Panel No results found for: CHOL, TRIG, HDL, CHOLHDL, VLDL, LDLCALC, LDLDIRECT  Physical Exam:    VS:  BP (!) 160/84   Pulse 97   Ht 5\' 1"  (1.549 m)   Wt 149 lb 3.2 oz (67.7 kg)   SpO2 90%   BMI 28.19 kg/m     Wt Readings  from Last 3 Encounters:  10/25/18 149 lb 3.2 oz (67.7 kg)  10/12/18 147 lb 9.6 oz (67 kg)  02/24/18 144 lb (65.3 kg)     GEN: Well nourished, well developed in no acute distress HEENT: Normal NECK: No JVD; No carotid bruits LYMPHATICS: No lymphadenopathy CARDIAC: S1S2 noted,RRR, no murmurs, rubs, gallops RESPIRATORY:  Clear to auscultation without rales, wheezing or rhonchi  ABDOMEN: Soft, non-tender, non-distended, +bowel sounds, no guarding. EXTREMITIES: No edema, No cyanosis, no clubbing MUSCULOSKELETAL:  No edema; No deformity  SKIN: Warm and dry NEUROLOGIC:  Alert and oriented x 3, non-focal PSYCHIATRIC:  Normal affect, good insight  ASSESSMENT:    1. Pre-procedure lab exam   2. Shortness of breath   3. Essential hypertension   4. Rheumatoid arthritis, involving unspecified site, unspecified rheumatoid factor presence ([place])    PLAN:    In order of problems listed above:  1.  I am concerned that she has been experiencing symptoms that could be equivocal to angina for acute coronary syndrome.  She does have risk factors: Hypertension, age, former smoker, significant family history of premature coronary disease (dad with MI at age 45), also hx of Rheumatoid arthritis therefore I will pursue CTA of the coronary arteries.  The patient educated about the test, risk and benefit explained.  2.  Her blood pressure is not well controlled today.  But I will hold off on starting any additional antihypertensives as we need to clear up and understand what actually she is taking at home.  Her  medication list reflects amlodipine 5 mg daily, Irbesatan 300 mg by mouth, valsartan 320 mg she is unsure if she is taking all of these medications.  I will see her back in 2 weeks she will bring all of her medications with her and will be able to go over this.  If there is need for any adjustments to her antihypertensive will discuss at that time.  For now have educated her on decreasing her salt intake.   3.  A transthoracic echocardiogram is also appropriate at this time to assess for right side and left-sided pressures.  The patient is in agreement with the above plan. The patient left the office in stable condition.  The patient will follow up in 3 weeks.   Medication Adjustments/Labs and Tests Ordered: Current medicines are reviewed at length with the patient today.  Concerns regarding medicines are outlined above.  Orders Placed This Encounter  Procedures  . CT CORONARY FRACTIONAL FLOW RESERVE DATA PREP  . CT CORONARY FRACTIONAL FLOW RESERVE FLUID ANALYSIS  . CT CORONARY MORPH W/CTA COR W/SCORE W/CA W/CM &/OR WO/CM  . Basic Metabolic Panel (BMET)  . EKG 12-Lead  . ECHOCARDIOGRAM COMPLETE   No orders of the defined types were placed in this encounter.   Patient Instructions  Medication Instructions:  Your physician recommends that you continue on your current medications as directed. Please refer to the Current Medication list given to you today.  If you need a refill on your cardiac medications before your next appointment, please call your pharmacy.   Lab work: Your physician recommends that you return for lab work in:   3-7 days prior to CT:BMP  If you have labs (blood work) drawn today and your tests are completely normal, you will receive your results only by: Marland Kitchen MyChart Message (if you have MyChart) OR . A paper copy in the mail If you have any lab test that is abnormal or we need to change your treatment, we will call you to review the results.  Testing/Procedures: Your physician has requested that you have an echocardiogram. Echocardiography is a painless test that uses sound waves to create images of your heart. It provides your doctor with information about the size and shape of your heart and how well your heart's chambers and valves are working. This procedure takes approximately one hour. There are no restrictions for this procedure.  Your physician has  requested that you have cardiac CT. Cardiac computed tomography (CT) is a painless test that uses an x-ray machine to take clear, detailed pictures of your heart. For further information please visit [email or web address]tn. Please follow instruction sheet as given.   Your cardiac CT will be scheduled at one of the below locations:   [facility] [address] [place], [place] [address] [phone]  If scheduled at [facility], please arrive at the [facility] main entrance of [facility] 30-45 minutes prior to test start time. Proceed to the [facility] Radiology Department (first floor) to check-in and test prep.    Please follow these instructions carefully (unless otherwise directed):  On the Night Before the Test: . Be sure to Drink plenty of water. . Do not consume any caffeinated/decaffeinated beverages or chocolate 12 hours prior to your test. . Do not take any antihistamines 12 hours prior to your test.  On the Day of the Test: . Drink plenty of water. Do not drink any water within one hour of the test. . Do not  eat any food 4 hours prior to the test. . You may take your regular medications prior to the test.  . No beta blocker due to prednisone and active COPD . FEMALES- please wear underwire-free bra if available     Do not give Lopressor to patients with an allergy to lopressor or anyone with asthma or active COPD symptoms (currently taking steroids).       After the Test: . Drink plenty of water. . After receiving IV contrast, you may experience a mild flushed feeling. This is normal. . On occasion, you may experience a mild rash up to 24 hours after the test. This is not dangerous. If this occurs, you can take Benadryl 25 mg and increase your fluid intake. . If you experience trouble breathing, this can be serious. If it is severe call 911 IMMEDIATELY. If it is mild, please call our office..    Please contact the cardiac imaging nurse  navigator should you have any questions/concerns Marchia Bond, RN Navigator Cardiac Imaging [facility] [phone] Office  [phone] Cell    Follow-Up: At [facility], you and your health needs are our priority.  As part of our continuing mission to provide you with exceptional heart care, we have created designated Provider Care Teams.  These Care Teams include your primary Cardiologist (physician) and Advanced Practice Providers (APPs -  Physician Assistants and Nurse Practitioners) who all work together to provide you with the care you need, when you need it. .   Any Other Special Instructions Will Be Listed Below (If Applicable).   Echocardiogram An echocardiogram is a procedure that uses painless sound waves (ultrasound) to produce an image of the heart. Images from an echocardiogram can provide important information about:  Signs of coronary artery disease (CAD).  Aneurysm detection. An aneurysm is a weak or damaged part of an artery wall that bulges out from the normal force of blood pumping through the body.  Heart size and shape. Changes in the size or shape of the heart can be associated with certain conditions, including heart failure, aneurysm, and CAD.  Heart muscle function.  Heart valve function.  Signs of a past heart attack.  Fluid buildup around the heart.  Thickening of the heart muscle.  A tumor or infectious growth around the heart valves. Tell a health care provider about:  Any allergies you have.  All medicines you are taking, including vitamins, herbs, eye drops, creams, and over-the-counter medicines.  Any blood disorders you have.  Any surgeries you have had.  Any medical conditions you have.  Whether you are pregnant or may be pregnant. What are the risks? Generally, this is a safe procedure. However, problems may occur, including:  Allergic reaction to dye (contrast) that may be used during the procedure.  What happens before the procedure? No specific preparation is needed. You may eat and drink normally. What happens during the procedure?   An IV tube may be inserted into one of your veins.  You may receive contrast through this tube. A contrast is an injection that improves the quality of the pictures from your heart.  A gel will be applied to your chest.  A wand-like tool (transducer) will be moved over your chest. The gel will help to transmit the sound waves from the transducer.  The sound waves will harmlessly bounce off of your heart to allow the heart images to be captured in real-time motion. The images will be recorded on a  computer. The procedure may vary among health care providers and hospitals. What happens after the procedure?  You may return to your normal, everyday life, including diet, activities, and medicines, unless your health care provider tells you not to do that. Summary  An echocardiogram is a procedure that uses painless sound waves (ultrasound) to produce an image of the heart.  Images from an echocardiogram can provide important information about the size and shape of your heart, heart muscle function, heart valve function, and fluid buildup around your heart.  You do not need to do anything to prepare before this procedure. You may eat and drink normally.  After the echocardiogram is completed, you may return to your normal, everyday life, unless your health care provider tells you not to do that. This information is not intended to replace advice given to you by your health care provider. Make sure you discuss any questions you have with your health care provider. Document Released: 01/18/2000 Document Revised: 05/13/2018 Document Reviewed: 02/23/2016 Elsevier Patient Education  2020 [place].   Cardiac CT Angiogram  A cardiac CT angiogram is a procedure to look at the heart and the area around the heart. It may be done to help find the cause of  chest pains or other symptoms of heart disease. During this procedure, a large X-ray machine, called a CT scanner, takes detailed pictures of the heart and the surrounding area after a dye (contrast material) has been injected into blood vessels in the area. The procedure is also sometimes called a coronary CT angiogram, coronary artery scanning, or CTA. A cardiac CT angiogram allows the health care provider to see how well blood is flowing to and from the heart. The health care provider will be able to see if there are any problems, such as:  Blockage or narrowing of the coronary arteries in the heart.  Fluid around the heart.  Signs of weakness or disease in the muscles, valves, and tissues of the heart. Tell a health care provider about:  Any allergies you have. This is especially important if you have had a previous allergic reaction to contrast dye.  All medicines you are taking, including vitamins, herbs, eye drops, creams, and over-the-counter medicines.  Any blood disorders you have.  Any surgeries you have had.  Any medical conditions you have.  Whether you are pregnant or may be pregnant.  Any anxiety disorders, chronic pain, or other conditions you have that may increase your stress or prevent you from lying still. What are the risks? Generally, this is a safe procedure. However, problems may occur, including:  Bleeding.  Infection.  Allergic reactions to medicines or dyes.  Damage to other structures or organs.  Kidney damage from the dye or contrast that is used.  Increased risk of cancer from radiation exposure. This risk is low. Talk with your health care provider about: ? The risks and benefits of testing. ? How you can receive the lowest dose of radiation. What happens before the procedure?  Wear comfortable clothing and remove any jewelry, glasses, dentures, and hearing aids.  Follow instructions from your health care provider about eating and drinking.  This may include: ? For 12 hours before the test - avoid caffeine. This includes tea, coffee, soda, energy drinks, and diet pills. Drink plenty of water or other fluids that do not have caffeine in them. Being well-hydrated can prevent complications. ? For 4-6 hours before the test - stop eating and drinking. The contrast dye can cause  nausea, but this is less likely if your stomach is empty.  Ask your health care provider about changing or stopping your regular medicines. This is especially important if you are taking diabetes medicines, blood thinners, or medicines to treat erectile dysfunction. What happens during the procedure?  Hair on your chest may need to be removed so that small sticky patches called electrodes can be placed on your chest. These will transmit information that helps to monitor your heart during the test.  An IV tube will be inserted into one of your veins.  You might be given a medicine to control your heart rate during the test. This will help to ensure that good images are obtained.  You will be asked to lie on an exam table. This table will slide in and out of the CT machine during the procedure.  Contrast dye will be injected into the IV tube. You might feel warm, or you may get a metallic taste in your mouth.  You will be given a medicine (nitroglycerin) to relax (dilate) the arteries in your heart.  The table that you are lying on will move into the CT machine tunnel for the scan.  The person running the machine will give you instructions while the scans are being done. You may be asked to: ? Keep your arms above your head. ? Hold your breath. ? Stay very still, even if the table is moving.  When the scanning is complete, you will be moved out of the machine.  The IV tube will be removed. The procedure may vary among health care providers and hospitals. What happens after the procedure?  You might feel warm, or you may get a metallic taste in your mouth  from the contrast dye.  You may have a headache from the nitroglycerin.  After the procedure, drink water or other fluids to wash (flush) the contrast material out of your body.  Contact a health care provider if you have any symptoms of allergy to the contrast. These symptoms include: ? Shortness of breath. ? Rash or hives. ? A racing heartbeat.  Most people can return to their normal activities right after the procedure. Ask your health care provider what activities are safe for you.  It is up to you to get the results of your procedure. Ask your health care provider, or the department that is doing the procedure, when your results will be ready. Summary  A cardiac CT angiogram is a procedure to look at the heart and the area around the heart. It may be done to help find the cause of chest pains or other symptoms of heart disease.  During this procedure, a large X-ray machine, called a CT scanner, takes detailed pictures of the heart and the surrounding area after a dye (contrast material) has been injected into blood vessels in the area.  Ask your health care provider about changing or stopping your regular medicines before the procedure. This is especially important if you are taking diabetes medicines, blood thinners, or medicines to treat erectile dysfunction.  After the procedure, drink water or other fluids to wash (flush) the contrast material out of your body. This information is not intended to replace advice given to you by your health care provider. Make sure you discuss any questions you have with your health care provider. Document Released: 01/03/2008 Document Revised: 01/02/2017 Document Reviewed: 12/10/2015 Elsevier Patient Education  2020 [employer].      Adopting a Healthy Lifestyle.  Know what  a healthy weight is for you (roughly BMI <25) and aim to maintain this   Aim for 7+ servings of fruits and vegetables daily   65-80+ fluid ounces of water or unsweet  tea for healthy kidneys   Limit to max 1 drink of alcohol per day; avoid smoking/tobacco   Limit animal fats in diet for cholesterol and heart health - choose grass fed whenever available   Avoid highly processed foods, and foods high in saturated/trans fats   Aim for low stress - take time to unwind and care for your mental health   Aim for 150 min of moderate intensity exercise weekly for heart health, and weights twice weekly for bone health   Aim for 7-9 hours of sleep daily   When it comes to diets, agreement about the perfect plan isnt easy to find, even among the experts. Experts at the [employer] developed an idea known as the Healthy Eating Plate. Just imagine a plate divided into logical, healthy portions.   The emphasis is on diet quality:   Load up on vegetables and fruits - one-half of your plate: Aim for color and variety, and remember that potatoes dont count.   Go for whole grains - one-quarter of your plate: Whole wheat, barley, wheat berries, quinoa, oats, brown rice, and foods made with them. If you want pasta, go with whole wheat pasta.   Protein power - one-quarter of your plate: Fish, chicken, beans, and nuts are all healthy, versatile protein sources. Limit red meat.   The diet, however, does go beyond the plate, offering a few other suggestions.   Use healthy plant oils, such as olive, canola, soy, corn, sunflower and peanut. Check the labels, and avoid partially hydrogenated oil, which have unhealthy trans fats.   If youre thirsty, drink water. Coffee and tea are good in moderation, but skip sugary drinks and limit milk and dairy products to one or two daily servings.   The type of carbohydrate in the diet is more important than the amount. Some sources of carbohydrates, such as vegetables, fruits, whole grains, and beans-are healthier than others.   Finally, stay active  Signed, Berniece Salines, DO  10/26/2018 9:05 PM    [facility]

## 2018-10-26 ENCOUNTER — Encounter: Payer: Self-pay | Admitting: Cardiology

## 2018-10-27 ENCOUNTER — Telehealth: Payer: Self-pay | Admitting: Pulmonary Disease

## 2018-10-27 DIAGNOSIS — D500 Iron deficiency anemia secondary to blood loss (chronic): Secondary | ICD-10-CM

## 2018-10-27 HISTORY — DX: Iron deficiency anemia secondary to blood loss (chronic): D50.0

## 2018-10-27 MED ORDER — BUDESONIDE-FORMOTEROL FUMARATE 160-4.5 MCG/ACT IN AERO: 2.0000 | INHALATION_SPRAY | Freq: Two times a day (BID) | RESPIRATORY_TRACT | 3 refills | Status: DC

## 2018-10-27 MED ORDER — SPIRIVA RESPIMAT 2.5 MCG/ACT IN AERS: 2.0000 | INHALATION_SPRAY | Freq: Every day | RESPIRATORY_TRACT | 3 refills | Status: DC

## 2018-10-27 NOTE — Telephone Encounter (Signed)
Spoke with pt, she states she would like to give an update on the inhalers. She states the Spiriva and the Symbicort work well for her and she would like a Rx for both sent to her pharmacy. Rx's sent to her pharmacy. FYI Dr Valeta Harms.    Patient Instructions by Vivia Ewing, LPN at 02/12/4172 08:14 AM Author: Vivia Ewing, LPN Author Type: Licensed Practical Nurse Filed: 10/12/2018 12:20 PM  Note Status: Addendum Cosign: Cosign Not Required Encounter Date: 10/12/2018  Editor: Vivia Ewing, LPN (Licensed Practical Nurse)  Prior Versions: 1. Garner Nash, DO (Physician) at 10/12/2018 12:19 PM - Addendum   2. Garner Nash, DO (Physician) at 10/12/2018 12:19 PM - Addendum   3. Vivia Ewing, LPN (Licensed Chiropractor) at 10/12/2018 12:12 PM - Signed    Thank you for visiting Dr. Valeta Harms at [facility]. Today we recommend the following:     Orders Placed This Encounter  Procedures  . CT CHEST HIGH RESOLUTION  . Ambulatory referral to Cardiology  . Pulmonary Function Test       Meds ordered this encounter  Medications  . budesonide-formoterol (SYMBICORT) 160-4.5 MCG/ACT inhaler    Sig: Inhale 2 puffs into the lungs every 12 (twelve) hours.    Dispense:  1 Inhaler    Refill:  0  . Tiotropium Bromide Monohydrate (SPIRIVA RESPIMAT) 2.5 MCG/ACT AERS    Sig: Inhale 2 puffs into the lungs daily.    Dispense:  1 g    Refill:  0    STOP TRELEGY

## 2018-10-27 NOTE — Telephone Encounter (Signed)
Thanks. Agree please refill  BLI

## 2018-10-28 ENCOUNTER — Telehealth: Payer: Self-pay | Admitting: Pulmonary Disease

## 2018-10-28 MED ORDER — BUDESONIDE-FORMOTEROL FUMARATE 160-4.5 MCG/ACT IN AERO: 2.0000 | INHALATION_SPRAY | Freq: Two times a day (BID) | RESPIRATORY_TRACT | 11 refills | Status: DC

## 2018-10-28 MED ORDER — SPIRIVA RESPIMAT 2.5 MCG/ACT IN AERS: 2.0000 | INHALATION_SPRAY | Freq: Every day | RESPIRATORY_TRACT | 11 refills | Status: DC

## 2018-10-28 NOTE — Telephone Encounter (Signed)
Spoke with pt. Advised her that we would be sending in these prescriptions. These were not sent in as previously documented. Nothing further was needed.

## 2018-10-28 NOTE — Telephone Encounter (Signed)
Returned call to patient and advised message would be sent to [facility] for results of CT scan. Pt was seen on  10/12/18 and states she did not get results of CT done in 2020. Pt also wants copy faxed to her cardiologist Kardio Tobb DO. Informed we would fax a copy of report and await feedback from Dr. Valeta Harms on CT scan.  Dr. Valeta Harms report scanned under 02/18/18.

## 2018-10-30 NOTE — Telephone Encounter (Signed)
PCCM:  I ordered the CT at the last office visit to have an HRCT complete.  Did she have this completed somewhere else? I do not see that it has been done?  Thanks  Garner Nash, DO [facility] 10/30/2018 1:46 PM

## 2018-11-01 NOTE — Telephone Encounter (Signed)
Patients Ct was done at [facility]

## 2018-11-01 NOTE — Telephone Encounter (Signed)
Found the CT in the PACS system. It was done on 10/15/18. I can print out the report but not the images.

## 2018-11-01 NOTE — Telephone Encounter (Signed)
Will route message to [facility] to schedule CT at first available.   Please schedule CT at first available per BI prior to appt 11/23/2018 and please contact patient. Thanks [employer].

## 2018-11-01 NOTE — Telephone Encounter (Signed)
PCCM:   The new [employer] pacs system I cannot look at the images.  Please place a copy of the results/report on my desk  Or maybe an app can review with her.  Would be good to get an actual copy of the images  Garner Nash, DO [facility] 11/01/2018 4:57 PM

## 2018-11-02 ENCOUNTER — Other Ambulatory Visit: Payer: Self-pay

## 2018-11-02 MED ORDER — MONTELUKAST SODIUM 10 MG PO TABS: 10.0000 mg | ORAL_TABLET | Freq: Every day | ORAL | 0 refills | Status: DC

## 2018-11-02 NOTE — Telephone Encounter (Signed)
Call made to patient, confirmed DOB, tele-visit appt made to discuss CT results per patient. Nothing further needed at this time.

## 2018-11-08 ENCOUNTER — Other Ambulatory Visit: Payer: Self-pay | Admitting: Pulmonary Disease

## 2018-11-08 ENCOUNTER — Encounter: Payer: Self-pay | Admitting: Pulmonary Disease

## 2018-11-08 ENCOUNTER — Telehealth: Payer: Self-pay

## 2018-11-08 ENCOUNTER — Other Ambulatory Visit: Payer: Self-pay

## 2018-11-08 ENCOUNTER — Ambulatory Visit (INDEPENDENT_AMBULATORY_CARE_PROVIDER_SITE_OTHER): Payer: Medicare Other | Admitting: Pulmonary Disease

## 2018-11-08 DIAGNOSIS — R918 Other nonspecific abnormal finding of lung field: Secondary | ICD-10-CM

## 2018-11-08 DIAGNOSIS — J449 Chronic obstructive pulmonary disease, unspecified: Secondary | ICD-10-CM | POA: Diagnosis not present

## 2018-11-08 DIAGNOSIS — J479 Bronchiectasis, uncomplicated: Secondary | ICD-10-CM | POA: Diagnosis not present

## 2018-11-08 DIAGNOSIS — R911 Solitary pulmonary nodule: Secondary | ICD-10-CM

## 2018-11-08 NOTE — Progress Notes (Signed)
Synopsis: Referred in September 2020 for establish care with new pulmonologist former patient of Dr. Lake Bells, PCP: Raina Mina., MD  Subjective:   PATIENT ID: [name] GENDER: female DOB: 16-Apr-1945, MRN: [id number]  Chief Complaint  Patient presents with   Follow-up    F/U to discuss. She reports her breathing has been at her baseline. She is using albuterol prn, symbicort and spiriva daily . No new concerns.     73 year old female past medical history of COPD, pulmonary embolism, rheumatoid arthritis, 5 mg prednisone plus Plaquenil, former patient of Dr. Lake Bells.  COPD currently managed with Trelegy.  Has had several images following lung nodules since 2017.  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  PET scan July 2019 showed FDG activity SUV 1.348 mm left lower lobe nodule.  IgE 158, Rast panel positive for cat dander, dog dander and Aspergillus fumigate [place].  Last evaluation included sputum cultures, fungus AFB.  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  Appears patient had exacerbation in February and was treated with Levaquin.  As for her RA she is currently on Plaquenil.  Overall she has seen a significant decline in her physical ability.  She states that she is really noticed a significant increase in her dyspnea over the past several months.  Otherwise her respiratory complaints related to her COPD have been stable.  She uses her Trelegy inhaler regularly.  She occasionally describes a central "abnormal feeling in her chest with exertion but most of the time this all dissipates with resting.  She states even the time yes tasks make her severely fatigued as come paired to previous.  Patient denies fevers chills weight loss hemoptysis.  OV 11/08/2018: (Via telephone visit).  Patient scheduled for telephone visit to review CT imaging.  Virtual Visit via Telephone  Note  I connected with [name] on 11/08/18 at  4:00 PM EDT by telephone and verified that I am speaking with the correct person using two identifiers.  Location: [facility] Patient: Jenavieve Freda  Provider: Garner Nash, DO    I discussed the limitations, risks, security and privacy concerns of performing an evaluation and management service by telephone and the availability of in person appointments. I also discussed with the patient that there may be a patient responsible charge related to this service. The patient expressed understanding and agreed to proceed.   History of Present Illness: Recent CT chest completed at [facility] with new 2 x 1.3 cm nodule abutting the minor fissure.  This was discussed with the patient.   Observations/Objective: CT scan results reviewed   I discussed the assessment and treatment plan with the patient. The patient was provided an opportunity to ask questions and all were answered. The patient agreed with the plan and demonstrated an understanding of the instructions.   The patient was advised to call back or seek an in-person evaluation if the symptoms worsen or if the condition fails to improve as anticipated.  I provided 16 minutes of non-face-to-face time during this encounter.   Garner Nash, DO     Past Medical History:  Diagnosis Date   Cat allergies    COPD (chronic obstructive pulmonary disease) ([place])    Hypertension    Osteoporosis    Pulmonary embolism ([place]) 2017   Rheumatoid arthritis(714.0)    Dr Tobie Lords follows     Family History  Problem Relation  Age of Onset   Heart disease Father    Stroke Mother    Clotting disorder Mother    Heart disease Sister    Sleep apnea Sister    Emphysema Sister    Breast cancer Maternal Aunt    Emphysema Paternal Grandmother      Past Surgical History:  Procedure Laterality Date   CYSTECTOMY Right    breast    Social History   Socioeconomic  History   Marital status: Widowed    Spouse name: Not on file   Number of children: Not on file   Years of education: Not on file   Highest education level: Not on file  Occupational History   Occupation: retired  Scientist, product/process development strain: Not on file   Food insecurity    Worry: Not on file    Inability: Not on Lexicographer needs    Medical: Not on file    Non-medical: Not on file  Tobacco Use   Smoking status: Former Smoker    Packs/day: 1.00    Years: 40.00    Pack years: 40.00    Types: Cigarettes    Quit date: 02/03/2001    Years since quitting: 17.7   Smokeless tobacco: Never Used  Substance and Sexual Activity   Alcohol use: Yes    Alcohol/week: 2.0 standard drinks    Types: 2 Standard drinks or equivalent per week    Comment: 2 drinks daily   Drug use: No   Sexual activity: Not on file  Lifestyle   Physical activity    Days per week: Not on file    Minutes per session: Not on file   Stress: Not on file  Relationships   Social connections    Talks on phone: Not on file    Gets together: Not on file    Attends religious service: Not on file    Active member of club or organization: Not on file    Attends meetings of clubs or organizations: Not on file    Relationship status: Not on file   Intimate partner violence    Fear of current or ex partner: Not on file    Emotionally abused: Not on file    Physically abused: Not on file    Forced sexual activity: Not on file  Other Topics Concern   Not on file  Social History Narrative   Tobacco use, amount per day now: NONE   Past tobacco use, amount per day: 1 PACK PER DAY   How many years did you use tobacco:TOO MANY STOPPED IN 2003   Alcohol use (drinks per week): 14   Diet: GOOD   Do you drink/eat things with caffeine: YES   Marital status: WIDOW                What year were you married? 1967   Do you live in a house, apartment, assisted living, condo, trailer,  etc.? HOUSE   Is it one or more stories? SPLIT LEVEL   How many persons live in your home? 1   Do you have pets in your home?( please list) NONE   Current or past profession: [employer]   Do you exercise?  NO                                Type and how often?  Do you have a living will? YES    Do you have a DNR form?  YES                                 If not, do you want to discuss one?   Do you have signed POA/HPOA forms?        ??              If so, please bring to you appointment      [facility] (05/16/16):   Originally from [place], [place]. 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 No pets currently. No bird, mold, or hot tub exposure.      Allergies  Allergen Reactions   Other Shortness Of Breath     Outpatient Medications Prior to Visit  Medication Sig Dispense Refill   albuterol (PROAIR HFA) 108 (90 Base) MCG/ACT inhaler Inhale 2 puffs into the lungs every 4 (four) hours as needed for wheezing or shortness of breath.     albuterol (PROVENTIL) (2.5 MG/3ML) 0.083% nebulizer solution Take 3 mLs (2.5 mg total) by nebulization every 6 (six) hours as needed for wheezing or shortness of breath. 360 mL 11   alendronate (FOSAMAX) 70 MG tablet Take 1 tablet (70 mg total) by mouth every 7 days.     amLODipine (NORVASC) 5 MG tablet Take 5 mg by mouth daily.     ASPIRIN PO Take 325 mg by mouth daily.      budesonide-formoterol (SYMBICORT) 160-4.5 MCG/ACT inhaler Inhale 2 puffs into the lungs every 12 (twelve) hours. 1 Inhaler 11   Cholecalciferol (VITAMIN D3 PO) Take 1 tablet by mouth daily.      Cyanocobalamin (VITAMIN B-12 PO) Take by mouth.     desloratadine (CLARINEX) 5 MG tablet Take 5 mg by mouth daily.     fexofenadine (ALLEGRA) 180 MG tablet Take 180 mg by mouth daily.     hydroxychloroquine (PLAQUENIL) 200 MG tablet 2 tabs twice daily  3   irbesartan (AVAPRO) 300 MG tablet Take 300 mg by mouth  daily.     montelukast (SINGULAIR) 10 MG tablet Take 1 tablet (10 mg total) by mouth at bedtime. 90 tablet 0   omeprazole (PRILOSEC) 20 MG capsule TAKE ONE CAPSULE BY MOUTH EVERY DAY 30 capsule 1   predniSONE (DELTASONE) 5 MG tablet Take 5 mg by mouth daily with breakfast.     Quercetin 250 MG TABS Take 500 mg by mouth daily.     sertraline (ZOLOFT) 100 MG tablet Take 1 tablet by mouth daily.     Tiotropium Bromide Monohydrate (SPIRIVA RESPIMAT) 2.5 MCG/ACT AERS Inhale 2 puffs into the lungs daily. 4 g 11   valsartan (DIOVAN) 320 MG tablet Take 320 mg by mouth daily.     No facility-administered medications prior to visit.     ROS   Objective:  Physical Exam   There were no vitals filed for this visit.   on RA BMI Readings from Last 3 Encounters:  10/25/18 28.19 kg/m  10/12/18 27.89 kg/m  02/24/18 27.21 kg/m   Wt Readings from Last 3 Encounters:  10/25/18 149 lb 3.2 oz (67.7 kg)  10/12/18 147 lb 9.6 oz (67 kg)  02/24/18 144 lb (65.3 kg)     CBC    Component Value Date/Time   WBC 6.4 10/03/2015 1512   WBC 3.4 (L) 11/24/2014 0029   RBC  3.77 10/03/2015 1512   RBC 3.09 (L) 11/24/2014 0029   HGB 12.1 10/03/2015 1512   HCT 36.7 10/03/2015 1512   PLT 349 10/03/2015 1512   MCV 97 10/03/2015 1512   MCH 32.1 10/03/2015 1512   MCH 32.7 11/24/2014 0029   MCHC 33.0 10/03/2015 1512   MCHC 33.9 11/24/2014 0029   RDW 14.0 10/03/2015 1512   LYMPHSABS 0.6 (L) 10/03/2015 1512   MONOABS 1.2 (H) 11/24/2014 0029   EOSABS 0.2 10/03/2015 1512   BASOSABS 0.0 10/03/2015 1512    Chest Imaging: CT chest 03/03/2017: Evidence of infectious bronchiolitis, 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  Moderate emphysema. The patient's images have been independently reviewed by me.    Pulmonary Functions Testing Results: PFT Results Latest Ref Rng & Units 09/05/2016  FVC-Pre L 1.71  FVC-Predicted Pre % 67  FVC-Post L 1.75    FVC-Predicted Post % 69  Pre FEV1/FVC % % 66  Post FEV1/FCV % % 69  FEV1-Pre L 1.13  FEV1-Predicted Pre % 59  FEV1-Post L 1.21  DLCO UNC% % 66  DLCO COR %Predicted % 86  TLC L 4.77  TLC % Predicted % 103  RV % Predicted % 128    FeNO: None   Pathology: None  Echocardiogram: None   Heart Catheterization: None     Assessment & Plan:     ICD-10-CM   1. Nodule of upper lobe of right lung  R91.1   2. Chronic obstructive pulmonary disease, unspecified COPD type ([place])  J44.9   3. Multiple lung nodules on CT  R91.8   4. Bronchiectasis without complication (H[facility])  V77.9     Discussion:  73 year old female with complaints of progressive dyspnea.  Past medical history of COPD former smoker RA multiple lung nodules.  Moderately reduced FEV1.  On triple therapy inhaler regimen.  CT scan completed recently at [facility].  There was a new 2 x 1.3 cm upper lobe nodule.  Evidence of mild centrilobular and paraseptal emphysema.  I cannot see these images myself.  I explained this to the patient.  We are going to work to obtain a CD sent from [facility] until they are able to get the electronic image sharing software to allow [place] to view this.  We are unable to do this right now with the recent upgrade in software.  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   Plan:  She needs to continue her current inhaler regimen. Continue her current RA regimen. We will obtain a CD to look at the images are self. Per the report this nodule is new and larger in comparison to CAT scan completed January 2020. We need to likely evaluate this nodule with a PET scan or short-term CT follow-up. Once I am able to see the images we will make recommendations regarding this.  16 minutes of non-face-to-face time was completed for this telephone visit.   Garner Nash, DO [facility] 11/08/2018 1:23 PM

## 2018-11-08 NOTE — Patient Instructions (Signed)
Thank you for visiting Dr. Valeta Harms at [facility]. Today we recommend the following:  We will request a copy of the CD of the chest images completed at [facility]. Once we review these images in the office we will make further recommendations. Likely next best step is a PET scan versus repeat short-term follow-up of noncontrasted CT chest.  Return in about 8 weeks (around 01/03/2019).    Please do your part to reduce the spread of COVID-19.

## 2018-11-08 NOTE — Telephone Encounter (Signed)
Release of info mailed to patient along with AVS. Upon return of release of info will fax to [facility] so we can obtain this patient's Chest CT.   Nothing further needed at this time.

## 2018-11-10 ENCOUNTER — Other Ambulatory Visit: Payer: Self-pay | Admitting: Pulmonary Disease

## 2018-11-16 ENCOUNTER — Telehealth: Payer: Self-pay | Admitting: Pulmonary Disease

## 2018-11-16 ENCOUNTER — Encounter: Payer: Self-pay | Admitting: Cardiology

## 2018-11-16 ENCOUNTER — Other Ambulatory Visit: Payer: Self-pay

## 2018-11-16 ENCOUNTER — Ambulatory Visit (INDEPENDENT_AMBULATORY_CARE_PROVIDER_SITE_OTHER): Payer: Medicare Other | Admitting: Cardiology

## 2018-11-16 VITALS — BP 150/80 | HR 87 | Ht 61.0 in | Wt 147.0 lb

## 2018-11-16 DIAGNOSIS — I10 Essential (primary) hypertension: Secondary | ICD-10-CM | POA: Diagnosis not present

## 2018-11-16 DIAGNOSIS — R0602 Shortness of breath: Secondary | ICD-10-CM | POA: Diagnosis not present

## 2018-11-16 MED ORDER — AMLODIPINE BESYLATE 10 MG PO TABS: 10.0000 mg | ORAL_TABLET | Freq: Every day | ORAL | 3 refills | Status: DC

## 2018-11-16 NOTE — Patient Instructions (Signed)
Medication Instructions:  Your physician has recommended you make the following change in your medication:  INCREASE amlodipine (norvasc) 10 mg: Take 1 tablet daily   If you need a refill on your cardiac medications before your next appointment, please call your pharmacy.   Lab work: None  If you have labs (blood work) drawn today and your tests are completely normal, you will receive your results only by: Marland Kitchen MyChart Message (if you have MyChart) OR . A paper copy in the mail If you have any lab test that is abnormal or we need to change your treatment, we will call you to review the results.  Testing/Procedures: None  Follow-Up: At [facility], you and your health needs are our priority.  As part of our continuing mission to provide you with exceptional heart care, we have created designated Provider Care Teams.  These Care Teams include your primary Cardiologist (physician) and Advanced Practice Providers (APPs -  Physician Assistants and Nurse Practitioners) who all work together to provide you with the care you need, when you need it. You will need a follow up appointment in 2 months.

## 2018-11-16 NOTE — Telephone Encounter (Signed)
Returned call to patient and notified we are only able to schedule covid testing @ [place] or [place]. Pt verbalized understanding and will keep original appt. Nothing further needed.

## 2018-11-16 NOTE — Progress Notes (Signed)
Cardiology Office Note:    Date:  11/16/2018   ID:  Desiree [name], DOB [birth year], MRN [id number]  PCP:  Desiree Mina., MD  Cardiologist:  No primary care provider on file.  Electrophysiologist:  None   Referring MD: Desiree Mina., MD   Chief Complaint  Patient presents with  . Follow-up   History of Present Illness:    Desiree [name] is a 73 y.o. female with a hx of hypertension, COPD who initially presented to be evaluated for on October 25, 2022 valuation of shortness of breath.  Given the patient risk factors and ischemia evaluation worse pursued with a CTA coronaries, in addition a transthoracic echocardiogram is ordered.  The patient has scheduled her testing and these are pending.  However today she is here due to follow-up for blood pressure which was elevated during our initial visit.   Past Medical History:  Diagnosis Date  . Cat allergies   . COPD (chronic obstructive pulmonary disease) ([place])   . Hypertension   . Osteoporosis   . Pulmonary embolism ([place]) 2017  . Rheumatoid arthritis(714.0)    Dr Desiree [name] follows    Past Surgical History:  Procedure Laterality Date  . CYSTECTOMY Right    breast    Current Medications: Current Meds  Medication Sig  . albuterol (PROAIR HFA) 108 (90 Base) MCG/ACT inhaler Inhale 2 puffs into the lungs every 4 (four) hours as needed for wheezing or shortness of breath.  Marland Kitchen alendronate (FOSAMAX) 70 MG tablet Take 1 tablet (70 mg total) by mouth every 7 days.  Marland Kitchen amLODipine (NORVASC) 5 MG tablet Take 5 mg by mouth daily.  . ASPIRIN PO Take 325 mg by mouth daily.   . budesonide-formoterol (SYMBICORT) 160-4.5 MCG/ACT inhaler Inhale 2 puffs into the lungs every 12 (twelve) hours.  . Cholecalciferol (VITAMIN D3 PO) Take 1 tablet by mouth daily.   . Cyanocobalamin (VITAMIN B-12 PO) Take by mouth.  . desloratadine (CLARINEX) 5 MG tablet Take 5 mg by mouth daily.  . fexofenadine (ALLEGRA) 180 MG tablet Take 180 mg by  mouth daily.  . hydroxychloroquine (PLAQUENIL) 200 MG tablet 2 tabs twice daily  . montelukast (SINGULAIR) 10 MG tablet Take 1 tablet (10 mg total) by mouth at bedtime.  Marland Kitchen omeprazole (PRILOSEC) 20 MG capsule TAKE ONE CAPSULE BY MOUTH EVERY DAY  . predniSONE (DELTASONE) 5 MG tablet Take 5 mg by mouth daily with breakfast.  . Quercetin 250 MG TABS Take 500 mg by mouth daily.  . sertraline (ZOLOFT) 100 MG tablet Take 1 tablet by mouth daily.  . Tiotropium Bromide Monohydrate (SPIRIVA RESPIMAT) 2.5 MCG/ACT AERS Inhale 2 puffs into the lungs daily.  . valsartan (DIOVAN) 320 MG tablet Take 320 mg by mouth daily.     Allergies:   Other   Social History   Socioeconomic History  . Marital status: Widowed    Spouse name: Not on file  . Number of children: Not on file  . Years of education: Not on file  . Highest education level: Not on file  Occupational History  . Occupation: retired  Scientific laboratory technician  . Financial resource strain: Not on file  . Food insecurity    Worry: Not on file    Inability: Not on file  . Transportation needs    Medical: Not on file    Non-medical: Not on file  Tobacco Use  . Smoking status: Former Smoker    Packs/day: 1.00    Years: 40.00  Pack years: 40.00    Types: Cigarettes    Quit date: 02/03/2001    Years since quitting: 17.7  . Smokeless tobacco: Never Used  Substance and Sexual Activity  . Alcohol use: Yes    Alcohol/week: 2.0 standard drinks    Types: 2 Standard drinks or equivalent per week    Comment: 2 drinks daily  . Drug use: No  . Sexual activity: Not on file  Lifestyle  . Physical activity    Days per week: Not on file    Minutes per session: Not on file  . Stress: Not on file  Relationships  . Social Herbalist on phone: Not on file    Gets together: Not on file    Attends religious service: Not on file    Active member of club or organization: Not on file    Attends meetings of clubs or organizations: Not on file     Relationship status: Not on file  Other Topics Concern  . Not on file  Social History Narrative   Tobacco use, amount per day now: NONE   Past tobacco use, amount per day: 1 PACK PER DAY   How many years did you use tobacco:TOO MANY STOPPED IN 2003   Alcohol use (drinks per week): 14   Diet: GOOD   Do you drink/eat things with caffeine: YES   Marital status: WIDOW                What year were you married? 1967   Do you live in a house, apartment, assisted living, condo, trailer, etc.? HOUSE   Is it one or more stories? SPLIT LEVEL   How many persons live in your home? 1   Do you have pets in your home?( please list) NONE   Current or past profession: [employer]   Do you exercise?  NO                                Type and how often?   Do you have a living will? YES    Do you have a DNR form?  YES                                 If not, do you want to discuss one?   Do you have signed POA/HPOA forms?        ??              If so, please bring to you appointment      [facility] (05/16/16):   Originally from [place], [place]. 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 No pets currently. No bird, mold, or hot tub exposure.      Family History: The patient's family history includes Breast cancer in her maternal aunt; Clotting disorder in her mother; Emphysema in her paternal grandmother and sister; Heart disease in her father and sister; Sleep apnea in her sister; Stroke in her mother.  ROS:   Review of Systems  Constitution: Negative for decreased appetite, fever and weight gain.  HENT: Negative for congestion, ear discharge, hoarse voice and sore throat.   Eyes: Negative for discharge, redness, vision loss in right eye and visual halos.  Cardiovascular: Negative for chest pain, dyspnea on exertion, leg swelling, orthopnea and palpitations.  Respiratory: Negative for cough, hemoptysis, shortness of breath and  snoring.   Endocrine: Negative for heat intolerance and polyphagia.  Hematologic/Lymphatic: Negative for bleeding problem. Does not bruise/bleed easily.  Skin: Negative for flushing, nail changes, rash and suspicious lesions.  Musculoskeletal: Negative for arthritis, joint pain, muscle cramps, myalgias, neck pain and stiffness.  Gastrointestinal: Negative for abdominal pain, bowel incontinence, diarrhea and excessive appetite.  Genitourinary: Negative for decreased libido, genital sores and incomplete emptying.  Neurological: Negative for brief paralysis, focal weakness, headaches and loss of balance.  Psychiatric/Behavioral: Negative for altered mental status, depression and suicidal ideas.  Allergic/Immunologic: Negative for HIV exposure and persistent infections.    EKGs/Labs/Other Studies Reviewed:    The following studies were reviewed today:   EKG:  None today.  Recent Labs: No results found for requested labs within last 8760 hours.  Recent Lipid Panel No results found for: CHOL, TRIG, HDL, CHOLHDL, VLDL, LDLCALC, LDLDIRECT  Physical Exam:    VS:  BP (!) 150/80 (BP Location: Left Arm, Patient Position: Sitting, Cuff Size: Normal)   Pulse 87   Ht 5\' 1"  (1.549 m)   Wt 147 lb (66.7 kg)   SpO2 92%   BMI 27.78 kg/m     Wt Readings from Last 3 Encounters:  11/16/18 147 lb (66.7 kg)  10/25/18 149 lb 3.2 oz (67.7 kg)  10/12/18 147 lb 9.6 oz (67 kg)     GEN: Well nourished, well developed in no acute distress HEENT: Normal NECK: No JVD; No carotid bruits LYMPHATICS: No lymphadenopathy CARDIAC: S1S2 noted,RRR, no murmurs, rubs, gallops RESPIRATORY:  Clear to auscultation without rales, wheezing or rhonchi  ABDOMEN: Soft, non-tender, non-distended, +bowel sounds, no guarding. EXTREMITIES: No edema, No cyanosis, no clubbing MUSCULOSKELETAL:  No edema; No deformity  SKIN: Warm and dry NEUROLOGIC:  Alert and oriented x 3, non-focal PSYCHIATRIC:  Normal affect, good insight   ASSESSMENT:    1. Essential hypertension   2. Shortness of breath    PLAN:    1.  Mrs. Cerullo is here today with her daily blood pressure readings. Her systolic blood pressure is averaging between 140-150 mmHG. I was able to review her medications with her. She takes Amlodipine 5mg  daily, valsartan 320 mg daily. Her blood pressure today manually in the office is 150/69mmHg.  Ideally it will be great to start a low dose of an additional antihypertensive but she prefers not to get any new medications added to her regimen - therefore I will increase the amlodipine to 10 mg.  I did educate the patient to notify the office if she experiences any leg swelling. She will continue take her blood pressure daily.  2.  Her transthoracic echocardiogram is going to be performed on November 29, 2018 will call patient with the results.  She has not had her CTA scheduled yet, we will reach out to understand why she has not been scheduled yet  and was holding up her scheduling.    The patient is in agreement with the above plan. The patient left the office in stable condition.  The patient will follow up in 3 months.    Medication Adjustments/Labs and Tests Ordered: Current medicines are reviewed at length with the patient today.  Concerns regarding medicines are outlined above.  No orders of the defined types were placed in this encounter.  No orders of the defined types were placed in this encounter.   There are no Patient Instructions on file for this visit.   Adopting a  Healthy Lifestyle.  Know what a healthy weight is for you (roughly BMI <25) and aim to maintain this   Aim for 7+ servings of fruits and vegetables daily   65-80+ fluid ounces of water or unsweet tea for healthy kidneys   Limit to max 1 drink of alcohol per day; avoid smoking/tobacco   Limit animal fats in diet for cholesterol and heart health - choose grass fed whenever available   Avoid highly processed foods, and foods high in  saturated/trans fats   Aim for low stress - take time to unwind and care for your mental health   Aim for 150 min of moderate intensity exercise weekly for heart health, and weights twice weekly for bone health   Aim for 7-9 hours of sleep daily   When it comes to diets, agreement about the perfect plan isnt easy to find, even among the experts. Experts at the [employer] developed an idea known as the Healthy Eating Plate. Just imagine a plate divided into logical, healthy portions.   The emphasis is on diet quality:   Load up on vegetables and fruits - one-half of your plate: Aim for color and variety, and remember that potatoes dont count.   Go for whole grains - one-quarter of your plate: Whole wheat, barley, wheat berries, quinoa, oats, brown rice, and foods made with them. If you want pasta, go with whole wheat pasta.   Protein power - one-quarter of your plate: Fish, chicken, beans, and nuts are all healthy, versatile protein sources. Limit red meat.   The diet, however, does go beyond the plate, offering a few other suggestions.   Use healthy plant oils, such as olive, canola, soy, corn, sunflower and peanut. Check the labels, and avoid partially hydrogenated oil, which have unhealthy trans fats.   If youre thirsty, drink water. Coffee and tea are good in moderation, but skip sugary drinks and limit milk and dairy products to one or two daily servings.   The type of carbohydrate in the diet is more important than the amount. Some sources of carbohydrates, such as vegetables, fruits, whole grains, and beans-are healthier than others.   Finally, stay active  Signed, Berniece Salines, DO  11/16/2018 11:42 AM    [place]

## 2018-11-17 LAB — BASIC METABOLIC PANEL
BUN/Creatinine Ratio: 19 (ref 12–28)
BUN: 19 mg/dL (ref 8–27)
CO2: 23 mmol/L (ref 20–29)
Calcium: 9.6 mg/dL (ref 8.7–10.3)
Chloride: 95 mmol/L — ABNORMAL LOW (ref 96–106)
Creatinine, Ser: 0.99 mg/dL (ref 0.57–1.00)
GFR calc Af Amer: 64 mL/min/{1.73_m2} (ref >59)
GFR calc non Af Amer: 56 mL/min/{1.73_m2} — ABNORMAL LOW (ref >59)
Glucose: 91 mg/dL (ref 65–99)
Potassium: 4.9 mmol/L (ref 3.5–5.2)
Sodium: 135 mmol/L (ref 134–144)

## 2018-11-18 ENCOUNTER — Other Ambulatory Visit (HOSPITAL_COMMUNITY)
Admission: RE | Admit: 2018-11-18 | Discharge: 2018-11-18 | Disposition: A | Discharge status: Home / Self Care | Payer: Medicare Other | Source: Ambulatory Visit | Attending: Pulmonary Disease | Admitting: Pulmonary Disease

## 2018-11-18 DIAGNOSIS — Z20828 Contact with and (suspected) exposure to other viral communicable diseases: Secondary | ICD-10-CM | POA: Insufficient documentation

## 2018-11-18 DIAGNOSIS — Z01812 Encounter for preprocedural laboratory examination: Secondary | ICD-10-CM | POA: Insufficient documentation

## 2018-11-20 LAB — NOVEL CORONAVIRUS, NAA (HOSP ORDER, SEND-OUT TO REF LAB; TAT 18-24 HRS): SARS-CoV-2, NAA: NOT DETECTED

## 2018-11-23 ENCOUNTER — Ambulatory Visit (INDEPENDENT_AMBULATORY_CARE_PROVIDER_SITE_OTHER): Payer: Medicare Other | Admitting: Pulmonary Disease

## 2018-11-23 ENCOUNTER — Other Ambulatory Visit: Payer: Self-pay

## 2018-11-23 ENCOUNTER — Ambulatory Visit: Payer: Medicare Other | Admitting: Pulmonary Disease

## 2018-11-23 ENCOUNTER — Encounter: Payer: Self-pay | Admitting: Pulmonary Disease

## 2018-11-23 VITALS — BP 132/80 | HR 97 | Temp 98.1°F | Ht 61.0 in | Wt 148.0 lb

## 2018-11-23 DIAGNOSIS — J479 Bronchiectasis, uncomplicated: Secondary | ICD-10-CM

## 2018-11-23 DIAGNOSIS — J449 Chronic obstructive pulmonary disease, unspecified: Secondary | ICD-10-CM | POA: Diagnosis not present

## 2018-11-23 DIAGNOSIS — R918 Other nonspecific abnormal finding of lung field: Secondary | ICD-10-CM

## 2018-11-23 DIAGNOSIS — R911 Solitary pulmonary nodule: Secondary | ICD-10-CM

## 2018-11-23 LAB — PULMONARY FUNCTION TEST
DL/VA % pred: 72 %
DL/VA: 3.05 ml/min/mmHg/L
DLCO unc % pred: 56 %
DLCO unc: 9.72 ml/min/mmHg
FEF 25-75 Post: 0.51 L/sec
FEF 25-75 Pre: 0.36 L/sec
FEF2575-%Change-Post: 42 %
FEF2575-%Pred-Post: 34 %
FEF2575-%Pred-Pre: 23 %
FEV1-%Change-Post: 2 %
FEV1-%Pred-Post: 51 %
FEV1-%Pred-Pre: 49 %
FEV1-Post: 0.94 L
FEV1-Pre: 0.91 L
FEV1FVC-%Change-Post: -1 %
FEV1FVC-%Pred-Pre: 83 %
FEV6-%Change-Post: 5 %
FEV6-%Pred-Post: 64 %
FEV6-%Pred-Pre: 61 %
FEV6-Post: 1.51 L
FEV6-Pre: 1.43 L
FEV6FVC-%Change-Post: 1 %
FEV6FVC-%Pred-Post: 105 %
FEV6FVC-%Pred-Pre: 103 %
FVC-%Change-Post: 4 %
FVC-%Pred-Post: 61 %
FVC-%Pred-Pre: 59 %
FVC-Post: 1.52 L
FVC-Pre: 1.46 L
Post FEV1/FVC ratio: 62 %
Post FEV6/FVC ratio: 100 %
Pre FEV1/FVC ratio: 63 %
Pre FEV6/FVC Ratio: 98 %
RV % pred: 113 %
RV: 2.43 L
TLC % pred: 90 %
TLC: 4.17 L

## 2018-11-23 NOTE — Progress Notes (Signed)
PFT done today. 

## 2018-11-23 NOTE — Patient Instructions (Addendum)
Thank you for visiting Dr. Valeta Harms at [facility]. Today we recommend the following:  Refills of symbicort and spiriva respimat CT Chest wo contrast 1st week of December to follow up on lung nodule.   Return in about 8 weeks (around 01/18/2019).    Please do your part to reduce the spread of COVID-19.

## 2018-11-23 NOTE — Progress Notes (Signed)
Synopsis: Referred in September 2020 for establish care with new pulmonologist former patient of Dr. Lake Bells, PCP: Raina Mina., MD  Subjective:   PATIENT ID: Desiree [name] GENDER: female DOB: 1945/07/02, MRN: [id number]  Chief Complaint  Patient presents with  . Follow-up    PFT done today.     73 year old female past medical history of COPD, pulmonary embolism, rheumatoid arthritis, 5 mg prednisone plus Plaquenil, former patient of Dr. Lake Bells.  COPD currently managed with Trelegy.  Has had several images following lung nodules since 2017.  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  PET scan July 2019 showed FDG activity SUV 1.348 mm left lower lobe nodule.  IgE 158, Rast panel positive for cat dander, dog dander and Aspergillus fumigate [place].  Last evaluation included sputum cultures, fungus AFB.  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  Appears patient had exacerbation in February and was treated with Levaquin.  As for her RA she is currently on Plaquenil.  Overall she has seen a significant decline in her physical ability.  She states that she is really noticed a significant increase in her dyspnea over the past several months.  Otherwise her respiratory complaints related to her COPD have been stable.  She uses her Trelegy inhaler regularly.  She occasionally describes a central "abnormal feeling in her chest with exertion but most of the time this all dissipates with resting.  She states even the time yes tasks make her severely fatigued as come paired to previous.  Patient denies fevers chills weight loss hemoptysis.  OV 11/08/2018: (Via telephone visit).  Patient scheduled for telephone visit to review CT imaging.  Patient found to have a 2 x 1.3 cm nodule abutting the minor fissure discussed via telephone today.  OV 11/23/2018: Here today to review CT imaging in  person.  Patient had pulmonary function test completed today prior to today's office visit.  PFTs revealed a ratio of 62, FEV1 51% predicted..  Also to review her CT imaging.  Patient very anxious about all of this that is going on.  She recently changed from Trelegy to Spiriva plus Symbicort.  She is doing well with her new inhaler regimen.  She feels less dyspneic.  Patient denies fevers chills night sweats weight loss, cough or sputum production.    Past Medical History:  Diagnosis Date  . Cat allergies   . COPD (chronic obstructive pulmonary disease) ([place])   . Hypertension   . Osteoporosis   . Pulmonary embolism ([place]) 2017  . Rheumatoid arthritis(714.0)    Dr Tobie Lords follows     Family History  Problem Relation Age of Onset  . Heart disease Father   . Stroke Mother   . Clotting disorder Mother   . Heart disease Sister   . Sleep apnea Sister   . Emphysema Sister   . Breast cancer Maternal Aunt   . Emphysema Paternal Grandmother      Past Surgical History:  Procedure Laterality Date  . CYSTECTOMY Right    breast    Social History   Socioeconomic History  . Marital status: Widowed    Spouse name: Not on file  . Number of children: Not on file  . Years of education: Not on file  . Highest education level: Not on file  Occupational History  . Occupation: retired  Scientific laboratory technician  . Financial resource strain: Not on file  .  Food insecurity    Worry: Not on file    Inability: Not on file  . Transportation needs    Medical: Not on file    Non-medical: Not on file  Tobacco Use  . Smoking status: Former Smoker    Packs/day: 1.00    Years: 40.00    Pack years: 40.00    Types: Cigarettes    Quit date: 02/03/2001    Years since quitting: 17.8  . Smokeless tobacco: Never Used  Substance and Sexual Activity  . Alcohol use: Yes    Alcohol/week: 2.0 standard drinks    Types: 2 Standard drinks or equivalent per week    Comment: 2 drinks daily  . Drug use: No  .  Sexual activity: Not on file  Lifestyle  . Physical activity    Days per week: Not on file    Minutes per session: Not on file  . Stress: Not on file  Relationships  . Social Herbalist on phone: Not on file    Gets together: Not on file    Attends religious service: Not on file    Active member of club or organization: Not on file    Attends meetings of clubs or organizations: Not on file    Relationship status: Not on file  . Intimate partner violence    Fear of current or ex partner: Not on file    Emotionally abused: Not on file    Physically abused: Not on file    Forced sexual activity: Not on file  Other Topics Concern  . Not on file  Social History Narrative   Tobacco use, amount per day now: NONE   Past tobacco use, amount per day: 1 PACK PER DAY   How many years did you use tobacco:TOO MANY STOPPED IN 2003   Alcohol use (drinks per week): 14   Diet: GOOD   Do you drink/eat things with caffeine: YES   Marital status: WIDOW                What year were you married? 1967   Do you live in a house, apartment, assisted living, condo, trailer, etc.? HOUSE   Is it one or more stories? SPLIT LEVEL   How many persons live in your home? 1   Do you have pets in your home?( please list) NONE   Current or past profession: Person   Do you exercise?  NO                                Type and how often?   Do you have a living will? YES    Do you have a DNR form?  YES                                 If not, do you want to discuss one?   Do you have signed POA/HPOA forms?        ??              If so, please bring to you appointment       Pulmonary (05/16/16):   Originally from [place], [place]. 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 No pets currently. No bird, mold, or hot tub exposure.      Allergies  Allergen Reactions  . Other Shortness Of Breath     Outpatient Medications Prior to  Visit  Medication Sig Dispense Refill  . albuterol (PROAIR HFA) 108 (90 Base) MCG/ACT inhaler Inhale 2 puffs into the lungs every 4 (four) hours as needed for wheezing or shortness of breath.    Marland Kitchen alendronate (FOSAMAX) 70 MG tablet Take 1 tablet (70 mg total) by mouth every 7 days.    Marland Kitchen amLODipine (NORVASC) 10 MG tablet Take 1 tablet (10 mg total) by mouth daily. 30 tablet 3  . ASPIRIN PO Take 325 mg by mouth daily.     . budesonide-formoterol (SYMBICORT) 160-4.5 MCG/ACT inhaler Inhale 2 puffs into the lungs every 12 (twelve) hours. 1 Inhaler 11  . Cholecalciferol (VITAMIN D3 PO) Take 1 tablet by mouth daily.     . Cyanocobalamin (VITAMIN B-12 PO) Take by mouth.    . desloratadine (CLARINEX) 5 MG tablet Take 5 mg by mouth daily.    . fexofenadine (ALLEGRA) 180 MG tablet Take 180 mg by mouth daily.    . hydroxychloroquine (PLAQUENIL) 200 MG tablet 2 tabs twice daily  3  . leflunomide (ARAVA) 20 MG tablet Take 20 mg by mouth daily.    . montelukast (SINGULAIR) 10 MG tablet Take 1 tablet (10 mg total) by mouth at bedtime. 90 tablet 0  . omeprazole (PRILOSEC) 20 MG capsule TAKE ONE CAPSULE BY MOUTH EVERY DAY 30 capsule 1  . predniSONE (DELTASONE) 5 MG tablet Take 5 mg by mouth daily with breakfast.    . Quercetin 250 MG TABS Take 500 mg by mouth daily.    . sertraline (ZOLOFT) 100 MG tablet Take 1 tablet by mouth daily.    . Tiotropium Bromide Monohydrate (SPIRIVA RESPIMAT) 2.5 MCG/ACT AERS Inhale 2 puffs into the lungs daily. 4 g 11  . valsartan (DIOVAN) 320 MG tablet Take 320 mg by mouth daily.     No facility-administered medications prior to visit.     Review of Systems  Constitutional: Negative for chills, fever, malaise/fatigue and weight loss.  HENT: Negative for hearing loss, sore throat and tinnitus.   Eyes: Negative for blurred vision and double vision.  Respiratory: Negative for cough, hemoptysis, sputum production, shortness of breath, wheezing and stridor.   Cardiovascular:  Negative for chest pain, palpitations, orthopnea, leg swelling and PND.  Gastrointestinal: Negative for abdominal pain, constipation, diarrhea, heartburn, nausea and vomiting.  Genitourinary: Negative for dysuria, hematuria and urgency.  Musculoskeletal: Negative for joint pain and myalgias.  Skin: Negative for itching and rash.  Neurological: Negative for dizziness, tingling, weakness and headaches.  Endo/Heme/Allergies: Negative for environmental allergies. Does not bruise/bleed easily.  Psychiatric/Behavioral: Negative for depression. The patient is not nervous/anxious and does not have insomnia.   All other systems reviewed and are negative.    Objective:  Physical Exam Vitals signs reviewed.  Constitutional:      General: She is not in acute distress.    Appearance: She is well-developed.  HENT:     Head: Normocephalic and atraumatic.  Eyes:     General: No scleral icterus.    Conjunctiva/sclera: Conjunctivae normal.     Pupils: Pupils are equal, round, and reactive to light.  Neck:     Musculoskeletal: Neck supple.     Vascular: No JVD.     Trachea: No tracheal deviation.  Cardiovascular:     Rate and Rhythm: Normal rate and regular rhythm.     Heart sounds: Normal heart sounds. No murmur.  Pulmonary:  Effort: Pulmonary effort is normal. No tachypnea, accessory muscle usage or respiratory distress.     Breath sounds: No stridor. No wheezing, rhonchi or rales.     Comments: Diminished breath sounds  Abdominal:     General: Bowel sounds are normal. There is no distension.     Palpations: Abdomen is soft.     Tenderness: There is no abdominal tenderness.  Musculoskeletal:        General: No tenderness.  Lymphadenopathy:     Cervical: No cervical adenopathy.  Skin:    General: Skin is warm and dry.     Capillary Refill: Capillary refill takes less than 2 seconds.     Findings: No rash.  Neurological:     Mental Status: She is alert and oriented to person, place,  and time.  Psychiatric:        Behavior: Behavior normal.      Vitals:   11/23/18 1413  BP: 132/80  Pulse: 97  Temp: 98.1 F (36.7 C)  TempSrc: Temporal  SpO2: 94%  Weight: 148 lb (67.1 kg)  Height: 5\' 1"  (1.549 m)   94% on RA BMI Readings from Last 3 Encounters:  11/23/18 27.96 kg/m  11/16/18 27.78 kg/m  10/25/18 28.19 kg/m   Wt Readings from Last 3 Encounters:  11/23/18 148 lb (67.1 kg)  11/16/18 147 lb (66.7 kg)  10/25/18 149 lb 3.2 oz (67.7 kg)     CBC    Component Value Date/Time   WBC 6.4 10/03/2015 1512   WBC 3.4 (L) 11/24/2014 0029   RBC 3.77 10/03/2015 1512   RBC 3.09 (L) 11/24/2014 0029   HGB 12.1 10/03/2015 1512   HCT 36.7 10/03/2015 1512   PLT 349 10/03/2015 1512   MCV 97 10/03/2015 1512   MCH 32.1 10/03/2015 1512   MCH 32.7 11/24/2014 0029   MCHC 33.0 10/03/2015 1512   MCHC 33.9 11/24/2014 0029   RDW 14.0 10/03/2015 1512   LYMPHSABS 0.6 (L) 10/03/2015 1512   MONOABS 1.2 (H) 11/24/2014 0029   EOSABS 0.2 10/03/2015 1512   BASOSABS 0.0 10/03/2015 1512    Chest Imaging: CT chest 03/03/2017: Evidence of infectious bronchiolitis, 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  Moderate emphysema. The patient's images have been independently reviewed by me.    CT chest September 2020: Reviewed with patient today in the office.  Does have 2 cm right upper lobe nodule with area of inflammatory appearing groundglass around this. Also has evidence of MAI and cylindric bronchiectasis. The patient's images have been independently reviewed by me.    Pulmonary Functions Testing Results: PFT Results Latest Ref Rng & Units 11/23/2018 09/05/2016  FVC-Pre L 1.46 1.71  FVC-Predicted Pre % 59 67  FVC-Post L 1.52 1.75  FVC-Predicted Post % 61 69  Pre FEV1/FVC % % 63 66  Post FEV1/FCV % % 62 69  FEV1-Pre L 0.91 1.13  FEV1-Predicted Pre % 49 59  FEV1-Post L 0.94 1.21  DLCO UNC% % 56 66  DLCO COR  %Predicted % 72 86  TLC L 4.17 4.77  TLC % Predicted % 90 103  RV % Predicted % 113 128    FeNO: None   Pathology: None  Echocardiogram: None   Heart Catheterization: None     Assessment & Plan:     ICD-10-CM   1. Nodule of upper lobe of right lung  R91.1   2. Multiple lung nodules on CT  R91.8   3. Bronchiectasis without  complication (HCC)  T95.3     Discussion:  73 year old with moderate COPD FEV1 51% predicted, by numbers but just a cutoff for severe disease.  PFTs completed prior to today office visit.   Doing well on new inhaler regimen.  Recently had CT imaging for follow-up lung nodule found to have a new 2 x 1.3 cm right upper lobe lung nodule.  Plan:  Management of COPD continue on Spiriva plus Symbicort. She needs a repeat noncontrasted CT of the chest. We will order the CT image to be completed at the first week of December.  Patient to follow-up in our office then after repeat CT imaging. Pending CT imaging we can consider discussion regarding biopsy.  However it does appear more inflammatory in nature.  Greater than 50% of this patient's 25-minute visit was spent face-to-face discussing above recommendations treatment plan as well as review of images.  We also reviewed her PFTs today in clinic in person.   Garner Nash, DO [facility] 11/23/2018 2:34 PM

## 2018-11-24 ENCOUNTER — Telehealth (HOSPITAL_COMMUNITY): Payer: Self-pay | Admitting: Emergency Medicine

## 2018-11-24 NOTE — Telephone Encounter (Signed)
Reaching out to patient to offer assistance regarding upcoming cardiac imaging study; pt verbalizes understanding of appt date/time, parking situation and where to check in, pre-test NPO status and medications ordered, and verified current allergies; name and call back number provided for further questions should they arise Brexley Cutshaw RN Navigator Cardiac Imaging [facility] [phone] office [phone] cell 

## 2018-11-25 ENCOUNTER — Encounter (HOSPITAL_COMMUNITY): Payer: Self-pay

## 2018-11-25 ENCOUNTER — Ambulatory Visit (HOSPITAL_COMMUNITY)
Admission: RE | Admit: 2018-11-25 | Discharge: 2018-11-25 | Disposition: A | Discharge status: Home / Self Care | Payer: Medicare Other | Source: Ambulatory Visit | Attending: Cardiology | Admitting: Cardiology

## 2018-11-25 ENCOUNTER — Other Ambulatory Visit: Payer: Self-pay

## 2018-11-25 NOTE — Progress Notes (Signed)
Patient cancelled due to HR (110), breathing issues, in-ablity to take beta blockers due to breathing issues and self administering all inhalers and prednisone before coming in for procedure. Tobb, MD notified of situation and new plan for patient made involving ivabridine that will be taken at home and Cardizem only while here at hospital. CT Navigator, Sherlyn Lick, updated and notified about situation.

## 2018-11-29 ENCOUNTER — Ambulatory Visit (INDEPENDENT_AMBULATORY_CARE_PROVIDER_SITE_OTHER): Payer: Medicare Other

## 2018-11-29 ENCOUNTER — Other Ambulatory Visit: Payer: Self-pay

## 2018-11-29 DIAGNOSIS — R0602 Shortness of breath: Secondary | ICD-10-CM

## 2018-11-29 NOTE — Progress Notes (Signed)
Complete echocardiogram has been performed.  Jimmy Melaya [place] RDCS, RVT 

## 2018-11-30 ENCOUNTER — Telehealth: Payer: Self-pay | Admitting: *Deleted

## 2018-11-30 ENCOUNTER — Encounter: Payer: Self-pay | Admitting: *Deleted

## 2018-11-30 DIAGNOSIS — R079 Chest pain, unspecified: Secondary | ICD-10-CM

## 2018-11-30 MED ORDER — PREDNISONE 10 MG PO TABS: 10.0000 mg | ORAL_TABLET | Freq: Every day | ORAL | 0 refills | Status: DC

## 2018-11-30 MED ORDER — CARVEDILOL 3.125 MG PO TABS: 3.1250 mg | ORAL_TABLET | Freq: Two times a day (BID) | ORAL | 1 refills | Status: DC

## 2018-11-30 NOTE — Telephone Encounter (Signed)
Telephone call back to patient. Explained Dr Harriet Masson will call in steroids for her COPD and prefers she have stress test. Pt agreeable and will cancel CTa coronaries

## 2018-11-30 NOTE — Addendum Note (Signed)
Addended by: Particia Nearing B on: 11/30/2018 01:58 PM   Modules accepted: Orders

## 2018-11-30 NOTE — Addendum Note (Signed)
Addended by: Particia Nearing B on: 11/30/2018 11:19 AM   Modules accepted: Orders

## 2018-11-30 NOTE — Telephone Encounter (Signed)
-----   Message from Berniece Salines, DO sent at 11/29/2018  1:54 PM EDT ----- Please let patient know that her Echo showed mildly depressed EF 45-50%. I will to start her on a new medication to help with her heart. Coreg 3.125mg  BID.  In addition since her heart rate did not response to the medication for the CTA. Please schedule her for a lexiscan as soon as we can get her in.

## 2018-11-30 NOTE — Telephone Encounter (Signed)
Spoke with Dr Harriet Masson regarding stress test . She wants to cancel Cta and get stress test but patient feels her COPD is not in control. Will speak to Dr Harriet Masson again and call her back

## 2018-11-30 NOTE — Telephone Encounter (Signed)
Please send prednisone 10 mg daily for 5 days prior to her Lexiscan.

## 2018-11-30 NOTE — Telephone Encounter (Signed)
Telephone call to patient. Informed of echo results and need for Coreg 3.125mg  1 tab twice daily. States still scheduled for CTa coronaries on 12/07/2018 . Will talk to Dr Harriet Masson about still needing the stress test.

## 2018-11-30 NOTE — Telephone Encounter (Signed)
Lexiscan scheduled for 12/15/18 at 0815. Letter generated with instructions mailed to patient. Pt called and notified of date and time and to expect instructions in the mail.Prednisone 10 mg sent in to pharmacy and patient instructed to to 10 mg daily x 5 days prior to [place]. Pt verbalized understanding to all instructions.

## 2018-12-06 ENCOUNTER — Telehealth: Payer: Self-pay | Admitting: Cardiology

## 2018-12-06 NOTE — Telephone Encounter (Signed)
Telephone call to patient. Asking about her prednisone. Instructed to take 10 mg daily x 5 days prior to stress test. Pt verbalized intstructions.

## 2018-12-06 NOTE — Telephone Encounter (Signed)
Has questions about prednisone

## 2018-12-07 ENCOUNTER — Ambulatory Visit (HOSPITAL_COMMUNITY): Payer: Medicare Other

## 2018-12-07 ENCOUNTER — Telehealth: Payer: Self-pay | Admitting: Pulmonary Disease

## 2018-12-07 NOTE — Telephone Encounter (Signed)
Spoke with pt, she is having trouble at night with wheezing and its keeping her awake. She is using Symbicort twice a day, spiriva once a day but doesn't use the albuterol much and usually only for rescue. She is also on 5mg  of prednisone daily. She doesn't have any other symptoms. I did suggest she try taking her albuterol before bedtime to see if this helps with the wheezing. She stated she was ok with trying that. Any other suggestions for pt? BI please advise.

## 2018-12-08 ENCOUNTER — Telehealth (HOSPITAL_COMMUNITY): Payer: Self-pay | Admitting: *Deleted

## 2018-12-08 NOTE — Telephone Encounter (Signed)
Pt returning phone call ° °

## 2018-12-08 NOTE — Telephone Encounter (Signed)
PCCM: yes use of albuterol for wheezing is appropriate. If she is still having symptoms we may need to consider increasing her prednisone for a short period.  Garner Nash, DO [facility] 12/08/2018 7:59 AM

## 2018-12-08 NOTE — Telephone Encounter (Signed)
LMTCB

## 2018-12-08 NOTE — Telephone Encounter (Signed)
Called and spoke with pt letting her know the info stated by BI. Pt said she is going to be having a stress test performed by cards and cards recommended her to take 10mg  prednisone x5 days which pt is going to start doing.  I stated to pt after she does the 10mg  x5 days, when she resumes taking her 5mg  daily, if she began to have increased symptoms to call our office back and we could discuss with BI if he wanted to increase her prednisone again at that point and pt verbalized understanding. Nothing further needed.

## 2018-12-08 NOTE — Telephone Encounter (Signed)
Patient given detailed instructions per Myocardial Perfusion Study Information Sheet for the test on 12/15/18. Patient notified to arrive 15 minutes early and that it is imperative to arrive on time for appointment to keep from having the test rescheduled.  If you need to cancel or reschedule your appointment, please call the office within 24 hours of your appointment. . Patient verbalized understanding. Kirstie Peri

## 2018-12-15 ENCOUNTER — Ambulatory Visit (INDEPENDENT_AMBULATORY_CARE_PROVIDER_SITE_OTHER): Payer: Medicare Other

## 2018-12-15 ENCOUNTER — Other Ambulatory Visit: Payer: Self-pay

## 2018-12-15 DIAGNOSIS — R079 Chest pain, unspecified: Secondary | ICD-10-CM | POA: Diagnosis not present

## 2018-12-15 LAB — MYOCARDIAL PERFUSION IMAGING
LV dias vol: 89 mL (ref 46–106)
LV sys vol: 40 mL
Peak HR: 115 {beats}/min
Rest HR: 83 {beats}/min
SDS: 4
SRS: 1
SSS: 5
TID: 0.98

## 2018-12-15 MED ORDER — REGADENOSON 0.4 MG/5ML IV SOLN
0.4000 mg | Freq: Once | INTRAVENOUS | Status: AC
  Administered 2018-12-15: 0.4 mg via INTRAVENOUS

## 2018-12-15 MED ORDER — TECHNETIUM TC 99M TETROFOSMIN IV KIT
30.8000 | PACK | Freq: Once | INTRAVENOUS | Status: AC | PRN
  Administered 2018-12-15: 30.8 via INTRAVENOUS

## 2018-12-15 MED ORDER — TECHNETIUM TC 99M TETROFOSMIN IV KIT
10.1000 | PACK | Freq: Once | INTRAVENOUS | Status: AC | PRN
  Administered 2018-12-15: 10.1 via INTRAVENOUS

## 2018-12-17 ENCOUNTER — Telehealth: Payer: Self-pay | Admitting: *Deleted

## 2018-12-17 NOTE — Telephone Encounter (Signed)
Left message to call back and make appointment next week to discuss stress test results .

## 2018-12-17 NOTE — Telephone Encounter (Signed)
-----   Message from Berniece Salines, DO sent at 12/15/2018  2:48 PM EST ----- Please have patient see in the next week. I will like to discuss her test results.

## 2018-12-21 ENCOUNTER — Ambulatory Visit: Payer: Medicare Other | Admitting: Cardiology

## 2018-12-24 ENCOUNTER — Ambulatory Visit (INDEPENDENT_AMBULATORY_CARE_PROVIDER_SITE_OTHER): Payer: Medicare Other | Admitting: Cardiology

## 2018-12-24 ENCOUNTER — Other Ambulatory Visit: Payer: Self-pay

## 2018-12-24 ENCOUNTER — Encounter: Payer: Self-pay | Admitting: Cardiology

## 2018-12-24 VITALS — BP 140/82 | HR 83 | Ht 61.0 in | Wt 147.0 lb

## 2018-12-24 DIAGNOSIS — E782 Mixed hyperlipidemia: Secondary | ICD-10-CM

## 2018-12-24 DIAGNOSIS — I10 Essential (primary) hypertension: Secondary | ICD-10-CM

## 2018-12-24 DIAGNOSIS — I480 Paroxysmal atrial fibrillation: Secondary | ICD-10-CM | POA: Diagnosis not present

## 2018-12-24 DIAGNOSIS — R9439 Abnormal result of other cardiovascular function study: Secondary | ICD-10-CM

## 2018-12-24 DIAGNOSIS — Z01812 Encounter for preprocedural laboratory examination: Secondary | ICD-10-CM

## 2018-12-24 DIAGNOSIS — R7303 Prediabetes: Secondary | ICD-10-CM

## 2018-12-24 MED ORDER — APIXABAN 5 MG PO TABS: 5.0000 mg | ORAL_TABLET | Freq: Two times a day (BID) | ORAL | 5 refills | Status: DC

## 2018-12-24 MED ORDER — ASPIRIN EC 81 MG PO TBEC: 81.0000 mg | DELAYED_RELEASE_TABLET | Freq: Every day | ORAL | 3 refills | Status: DC

## 2018-12-24 MED ORDER — ATORVASTATIN CALCIUM 20 MG PO TABS: 20.0000 mg | ORAL_TABLET | Freq: Every day | ORAL | 1 refills | Status: DC

## 2018-12-24 NOTE — Patient Instructions (Signed)
Medication Instructions:  Your physician has recommended you make the following change in your medication:   START: Aspirin 81 mg Take 1 tab daily START: Lipitor(atorvostatin) 20 mg Take 1 tab daily  *If you need a refill on your cardiac medications before your next appointment, please call your pharmacy*  Lab Work: Your physician recommends that you return for lab work in: [place] CBC,BMP  If you have labs (blood work) drawn today and your tests are completely normal, you will receive your results only by: Marland Kitchen MyChart Message (if you have MyChart) OR . A paper copy in the mail If you have any lab test that is abnormal or we need to change your treatment, we will call you to review the results.  Testing/Procedures:    [place] [id number] Dept: [phone] Loc: [place]  12/24/2018  You are scheduled for a Cardiac Catheterization on Wednesday, December 2 with Dr. Larae Grooms.  1. Please arrive at the [facility] (Main Entrance A) at [facility]: [address] [place], [place] [address] at 9:30 AM (This time is two hours before your procedure to ensure your preparation). Free valet parking service is available.   Special note: Every effort is made to have your procedure done on time. Please understand that emergencies sometimes delay scheduled procedures.  2. Diet: Do not eat solid foods after midnight.  The patient may have clear liquids until 5am upon the day of the procedure.  3. Labs: You will need to have a COVID screening . Your appointment is Sat Nov 28 ,2020 at 1205 PM. Arrive at [place].   4. Medication instructions in preparation for your procedure:   Contrast Allergy: No  On the morning of your procedure, take your Aspirin and any morning medicines NOT listed above.  You may use sips of water.  5.  Plan for one night stay--bring personal belongings. 6. Bring a current list of your medications and current insurance cards. 7. You MUST have a responsible person to drive you home. 8. Someone MUST be with you the first 24 hours after you arrive home or your discharge will be delayed. 9. Please wear clothes that are easy to get on and off and wear slip-on shoes.  Thank you for allowing [place] to care for you!   -- [facility]  Follow-Up: At [facility] [place], you and your health needs are our priority.  As part of our continuing mission to provide you with exceptional heart care, we have created designated Provider Care Teams.  These Care Teams include your primary Cardiologist (physician) and Advanced Practice Providers (APPs -  Physician Assistants and Nurse Practitioners) who all work together to provide you with the care you need, when you need it.  Your next appointment:   1 month(s)  The format for your next appointment:   In Person  Provider:   Berniece Salines, DO  Other Instructions

## 2018-12-24 NOTE — Progress Notes (Addendum)
Cardiology Office Note:    Date:  12/24/2018   ID:  [name], [place] Oct 22, 1945, MRN [id number]  PCP:  Raina Mina., MD  Cardiologist:  Berniece Salines, DO  Electrophysiologist:  None   Referring MD: Raina Mina., MD   Chief Complaint  Patient presents with  . Follow-up    History of Present Illness:    Desiree [name] is a 73 y.o. female with a hx of hypertension, now mildly depressed left ventricular ejection fraction, COPD who initially presented to be evaluated for on October 25, 2022 valuation of shortness of breath.  Given the patient risk factors and ischemia evaluation worse pursued with a CTA coronaries, in addition a transthoracic echocardiogram is ordered.  The patient was able to undergo her transthoracic echocardiogram.  But could not get her CT coronaries due to heart rate not be able to be under control.  Therefore recommended patient undergo pharmacologic nuclear stress test.  With her asthma she was prepped with steroids and presented to get her testing done.  She is here today for follow-up visit.   Past Medical History:  Diagnosis Date  . Cat allergies   . COPD (chronic obstructive pulmonary disease) ([place])   . Hypertension   . Osteoporosis   . Pulmonary embolism ([place]) 2017  . Rheumatoid arthritis(714.0)    Dr Tobie Lords follows    Past Surgical History:  Procedure Laterality Date  . CYSTECTOMY Right    breast    Current Medications: Current Meds  Medication Sig  . albuterol (PROAIR HFA) 108 (90 Base) MCG/ACT inhaler Inhale 2 puffs into the lungs every 4 (four) hours as needed for wheezing or shortness of breath.  Marland Kitchen alendronate (FOSAMAX) 70 MG tablet Take 1 tablet (70 mg total) by mouth every 7 days.  Marland Kitchen amLODipine (NORVASC) 10 MG tablet Take 1 tablet (10 mg total) by mouth daily.  . budesonide-formoterol (SYMBICORT) 160-4.5 MCG/ACT inhaler Inhale 2 puffs into the lungs every 12 (twelve) hours.  . carvedilol (COREG) 3.125 MG tablet  Take 1 tablet (3.125 mg total) by mouth 2 (two) times daily with a meal.  . Cholecalciferol (VITAMIN D3 PO) Take 1 tablet by mouth daily.   . Cyanocobalamin (VITAMIN B-12 PO) Take by mouth.  . desloratadine (CLARINEX) 5 MG tablet Take 5 mg by mouth daily.  . fexofenadine (ALLEGRA) 180 MG tablet Take 180 mg by mouth daily.  . hydroxychloroquine (PLAQUENIL) 200 MG tablet 2 tabs twice daily  . leflunomide (ARAVA) 20 MG tablet Take 20 mg by mouth daily.  . montelukast (SINGULAIR) 10 MG tablet Take 1 tablet (10 mg total) by mouth at bedtime.  Marland Kitchen omeprazole (PRILOSEC) 20 MG capsule TAKE ONE CAPSULE BY MOUTH EVERY DAY  . predniSONE (DELTASONE) 5 MG tablet Take 5 mg by mouth daily with breakfast.  . Quercetin 250 MG TABS Take 500 mg by mouth daily.  . sertraline (ZOLOFT) 100 MG tablet Take 1 tablet by mouth daily.  . Tiotropium Bromide Monohydrate (SPIRIVA RESPIMAT) 2.5 MCG/ACT AERS Inhale 2 puffs into the lungs daily.  . valsartan (DIOVAN) 320 MG tablet Take 320 mg by mouth daily.  . [DISCONTINUED] ASPIRIN PO Take 325 mg by mouth daily.      Allergies:   Other   Social History   Socioeconomic History  . Marital status: Widowed    Spouse name: Not on file  . Number of children: Not on file  . Years of education: Not on file  . Highest education level: Not  on file  Occupational History  . Occupation: retired  Scientific laboratory technician  . Financial resource strain: Not on file  . Food insecurity    Worry: Not on file    Inability: Not on file  . Transportation needs    Medical: Not on file    Non-medical: Not on file  Tobacco Use  . Smoking status: Former Smoker    Packs/day: 1.00    Years: 40.00    Pack years: 40.00    Types: Cigarettes    Quit date: 02/03/2001    Years since quitting: 17.8  . Smokeless tobacco: Never Used  Substance and Sexual Activity  . Alcohol use: Yes    Alcohol/week: 2.0 standard drinks    Types: 2 Standard drinks or equivalent per week    Comment: 2 drinks daily  .  Drug use: No  . Sexual activity: Not on file  Lifestyle  . Physical activity    Days per week: Not on file    Minutes per session: Not on file  . Stress: Not on file  Relationships  . Social Herbalist on phone: Not on file    Gets together: Not on file    Attends religious service: Not on file    Active member of club or organization: Not on file    Attends meetings of clubs or organizations: Not on file    Relationship status: Not on file  Other Topics Concern  . Not on file  Social History Narrative   Tobacco use, amount per day now: NONE   Past tobacco use, amount per day: 1 PACK PER DAY   How many years did you use tobacco:TOO MANY STOPPED IN 2003   Alcohol use (drinks per week): 14   Diet: GOOD   Do you drink/eat things with caffeine: YES   Marital status: WIDOW                What year were you married? 1967   Do you live in a house, apartment, assisted living, condo, trailer, etc.? HOUSE   Is it one or more stories? SPLIT LEVEL   How many persons live in your home? 1   Do you have pets in your home?( please list) NONE   Current or past profession: [employer]   Do you exercise?  NO                                Type and how often?   Do you have a living will? YES    Do you have a DNR form?  YES                                 If not, do you want to discuss one?   Do you have signed POA/HPOA forms?        ??              If so, please bring to you appointment      [facility] (05/16/16):   Originally from [place], [place]. 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 No pets currently. No bird, mold, or hot tub exposure.      Family History: The patient's family history includes Breast cancer in her maternal aunt; Clotting disorder in her mother; Emphysema in her  paternal grandmother and sister; Heart disease in her father and sister; Sleep apnea in her sister; Stroke in her mother.  ROS:    Review of Systems  Constitution: Negative for decreased appetite, fever and weight gain.  HENT: Negative for congestion, ear discharge, hoarse voice and sore throat.   Eyes: Negative for discharge, redness, vision loss in right eye and visual halos.  Cardiovascular: Negative for chest pain, dyspnea on exertion, leg swelling, orthopnea and palpitations.  Respiratory: Negative for cough, hemoptysis, shortness of breath and snoring.   Endocrine: Negative for heat intolerance and polyphagia.  Hematologic/Lymphatic: Negative for bleeding problem. Does not bruise/bleed easily.  Skin: Negative for flushing, nail changes, rash and suspicious lesions.  Musculoskeletal: Negative for arthritis, joint pain, muscle cramps, myalgias, neck pain and stiffness.  Gastrointestinal: Negative for abdominal pain, bowel incontinence, diarrhea and excessive appetite.  Genitourinary: Negative for decreased libido, genital sores and incomplete emptying.  Neurological: Negative for brief paralysis, focal weakness, headaches and loss of balance.  Psychiatric/Behavioral: Negative for altered mental status, depression and suicidal ideas.  Allergic/Immunologic: Negative for HIV exposure and persistent infections.    EKGs/Labs/Other Studies Reviewed:    The following studies were reviewed today:   EKG: Atrial fibrillation, heart rate 86 bpm with RSR prime in precordial progression which could be suggestive of anterolateral infarction.    Pharmacologic nuclear stress test.  12/15/2018  Nuclear stress EF: 55%.  The left ventricular ejection fraction is normal (55-65%).  There was no ST segment deviation noted during stress.  Defect 1: There is a small defect of mild severity present in the apical inferior location.  Findings consistent with ischemia.  This is an intermediate risk study.  Small area of ischemia involving apical portion of the inferior wall.  Normal EF.  Transthoracic echocardiogram  11/29/2018 IMPRESSIONS   1. Left ventricular ejection fraction, by visual estimation, is 45 to 50%. The left ventricle has normal function. Normal left ventricular size. There is borderline left ventricular hypertrophy.  2. Left ventricular diastolic Doppler parameters are consistent with impaired relaxation pattern of LV diastolic filling.  3. Global right ventricle has normal systolic function.The right ventricular size is normal. No increase in right ventricular wall thickness.  4. Left atrial size was normal.  5. Right atrial size was normal.  6. The mitral valve is normal in structure. No evidence of mitral valve regurgitation. No evidence of mitral stenosis.  7. The tricuspid valve is normal in structure. Tricuspid valve regurgitation was not visualized by color flow Doppler.  8. The aortic valve is normal in structure. Aortic valve regurgitation was not visualized by color flow Doppler. Structurally normal aortic valve, with no evidence of sclerosis or stenosis.  9. The pulmonic valve was normal in structure. Pulmonic valve regurgitation is not visualized by color flow Doppler. 10. The inferior vena cava is normal in size with greater than 50% respiratory variability, suggesting right atrial pressure of 3 mmHg  Recent Labs: 11/16/2018: BUN 19; Creatinine, Ser 0.99; Potassium 4.9; Sodium 135  Recent Lipid Panel No results found for: CHOL, TRIG, HDL, CHOLHDL, VLDL, LDLCALC, LDLDIRECT  Physical Exam:    VS:  BP 140/82 (BP Location: Right Arm, Patient Position: Sitting, Cuff Size: Normal)   Pulse 83   Ht 5\' 1"  (1.549 m)   Wt 147 lb (66.7 kg)   SpO2 92%   BMI 27.78 kg/m     Wt Readings from Last 3 Encounters:  12/24/18 147 lb (66.7 kg)  12/15/18 148 lb (67.1 kg)  11/23/18 148 lb (67.1 kg)     GEN: Well nourished, well developed in no acute distress HEENT: Normal NECK: No JVD; No carotid bruits LYMPHATICS: No lymphadenopathy CARDIAC: S1S2 noted,RRR, no murmurs, rubs, gallops  RESPIRATORY:  Clear to auscultation without rales, wheezing or rhonchi  ABDOMEN: Soft, non-tender, non-distended, +bowel sounds, no guarding. EXTREMITIES: No edema, No cyanosis, no clubbing MUSCULOSKELETAL:  No edema; No deformity  SKIN: Warm and dry NEUROLOGIC:  Alert and oriented x 3, non-focal PSYCHIATRIC:  Normal affect, good insight  ASSESSMENT:    1. Abnormal nuclear stress test   2. Paroxysmal atrial fibrillation (HCC)   3. Pre-procedure lab exam   4. Essential hypertension   5. Mixed hyperlipidemia   6. Prediabetes    PLAN:    1.  Her nuclear stress performed on December 15, 2018 is positive for ischemia.  Given her persistent symptoms of shortness of breath and positive stress test I have discussed with the patient to see with a left heart catheterization.  We discussed in great detail about the testing.  I educated her about the importance, all of her questions were answered.  She is agreeable to perform this testing.  The patient understands that risks include but are not limited to stroke (1 in 1000), death (1 in 1), kidney failure [usually temporary] (1 in 500), bleeding (1 in 200), allergic reaction [possibly serious] (1 in 200), and agrees to proceed.  She has been started on 81 mg daily as well as atorvastatin 20 mg daily.  2.  Hypertension-she will be continued on her current antihypertensive medication.  3.  Atrial fibrillation-this is a new diagnosis. I have educated the patient on this. She is rate controlled today. She is currently on Coreg 3.125 mg twice daily. Her Chadsvasc score is 4.  Eliquis 5 mg twice daily.  The patient was educated about this highly diagnosis and this medication.  I have educated patient on the side effects of this medication. I also urge her to abstain from any taking behaviors.  She understands that she is now at a high risk of bleeding due to being on anticoagulation.  She was also advised that if she ever falls and especially hit her head  to be seen in the emergency department.  She also understands that if she ends up with PCI she may need to be on aspirin, Plavix and Eliquis for the first 30 days.   The patient is in agreement with the above plan. The patient left the office in stable condition.  The patient will follow up in 1 month.  Medication Adjustments/Labs and Tests Ordered: Current medicines are reviewed at length with the patient today.  Concerns regarding medicines are outlined above.   Orders Placed This Encounter  Procedures  . CBC  . Basic Metabolic Panel (BMET)  . EKG 12-Lead   Meds ordered this encounter  Medications  . aspirin EC 81 MG tablet    Sig: Take 1 tablet (81 mg total) by mouth daily.    Dispense:  90 tablet    Refill:  3  . atorvastatin (LIPITOR) 20 MG tablet    Sig: Take 1 tablet (20 mg total) by mouth daily.    Dispense:  90 tablet    Refill:  1    Patient Instructions  Medication Instructions:  Your physician has recommended you make the following change in your medication:   START: Aspirin 81 mg Take 1 tab daily START: Lipitor(atorvostatin) 20 mg Take 1 tab daily  *  If you need a refill on your cardiac medications before your next appointment, please call your pharmacy*  Lab Work: Your physician recommends that you return for lab work in: [place] CBC,BMP  If you have labs (blood work) drawn today and your tests are completely normal, you will receive your results only by: Marland Kitchen MyChart Message (if you have MyChart) OR . A paper copy in the mail If you have any lab test that is abnormal or we need to change your treatment, we will call you to review the results.  Testing/Procedures:    [place] [address] Dept: [phone] Loc: [place]  12/24/2018  You are scheduled for a Cardiac Catheterization on Wednesday, December 2 with Dr. Larae Grooms.  1. Please arrive at the [facility] (Main Entrance A) at [facility]: [address] [place], [place] [address] at 9:30 AM (This time is two hours before your procedure to ensure your preparation). Free valet parking service is available.   Special note: Every effort is made to have your procedure done on time. Please understand that emergencies sometimes delay scheduled procedures.  2. Diet: Do not eat solid foods after midnight.  The patient may have clear liquids until 5am upon the day of the procedure.  3. Labs: You will need to have a COVID screening . Your appointment is Sat Nov 28 ,2020 at 1205 PM. Arrive at [place].   4. Medication instructions in preparation for your procedure:   Contrast Allergy: No  On the morning of your procedure, take your Aspirin and any morning medicines NOT listed above.  You may use sips of water.  5. Plan for one night stay--bring personal belongings. 6. Bring a current list of your medications and current insurance cards. 7. You MUST have a responsible person to drive you home. 8. Someone MUST be with you the first 24 hours after you arrive home or your discharge will be delayed. 9. Please wear clothes that are easy to get on and off and wear slip-on shoes.  Thank you for allowing [place] to care for you!   -- [facility]  Follow-Up: At [facility], you and your health needs are our priority.  As part of our continuing mission to provide you with exceptional heart care, we have created designated Provider Care Teams.  These Care Teams include your primary Cardiologist (physician) and Advanced Practice Providers (APPs -  Physician Assistants and Nurse Practitioners) who all work together to provide you with the care you need, when you need it.  Your next appointment:   1 month(s)  The format for your next appointment:   In Person  Provider:   Berniece Salines, DO  Other  Instructions      Adopting a Healthy Lifestyle.  Know what a healthy weight is for you (roughly BMI <25) and aim to maintain this   Aim for 7+ servings of fruits and vegetables daily   65-80+ fluid ounces of water or unsweet tea for healthy kidneys   Limit to max 1 drink of alcohol per day; avoid smoking/tobacco   Limit animal fats in diet for cholesterol and heart health - choose grass fed whenever available   Avoid highly processed foods, and foods high in saturated/trans fats   Aim for low stress - take time to unwind and care for your mental health  Aim for 150 min of moderate intensity exercise weekly for heart health, and weights twice weekly for bone health   Aim for 7-9 hours of sleep daily   When it comes to diets, agreement about the perfect plan isnt easy to find, even among the experts. Experts at the [facility] developed an idea known as the Healthy Eating Plate. Just imagine a plate divided into logical, healthy portions.   The emphasis is on diet quality:   Load up on vegetables and fruits - one-half of your plate: Aim for color and variety, and remember that potatoes dont count.   Go for whole grains - one-quarter of your plate: Whole wheat, barley, wheat berries, quinoa, oats, brown rice, and foods made with them. If you want pasta, go with whole wheat pasta.   Protein power - one-quarter of your plate: Fish, chicken, beans, and nuts are all healthy, versatile protein sources. Limit red meat.   The diet, however, does go beyond the plate, offering a few other suggestions.   Use healthy plant oils, such as olive, canola, soy, corn, sunflower and peanut. Check the labels, and avoid partially hydrogenated oil, which have unhealthy trans fats.   If youre thirsty, drink water. Coffee and tea are good in moderation, but skip sugary drinks and limit milk and dairy products to one or two daily servings.   The type of carbohydrate in the diet  is more important than the amount. Some sources of carbohydrates, such as vegetables, fruits, whole grains, and beans-are healthier than others.   Finally, stay active  Signed, Berniece Salines, DO  12/24/2018 1:48 PM    [facility]

## 2018-12-24 NOTE — H&P (View-Only) (Signed)
Cardiology Office Note:    Date:  12/24/2018   ID:  [name], [place] 1945/02/09, MRN [id number]  PCP:  Raina Mina., MD  Cardiologist:  Berniece Salines, DO  Electrophysiologist:  None   Referring MD: Raina Mina., MD   Chief Complaint  Patient presents with  . Follow-up    History of Present Illness:    Desiree [name] is a 73 y.o. female with a hx of hypertension, now mildly depressed left ventricular ejection fraction, COPD who initially presented to be evaluated for on October 25, 2022 valuation of shortness of breath.  Given the patient risk factors and ischemia evaluation worse pursued with a CTA coronaries, in addition a transthoracic echocardiogram is ordered.  The patient was able to undergo her transthoracic echocardiogram.  But could not get her CT coronaries due to heart rate not be able to be under control.  Therefore recommended patient undergo pharmacologic nuclear stress test.  With her asthma she was prepped with steroids and presented to get her testing done.  She is here today for follow-up visit.   Past Medical History:  Diagnosis Date  . Cat allergies   . COPD (chronic obstructive pulmonary disease) ([place])   . Hypertension   . Osteoporosis   . Pulmonary embolism ([place]) 2017  . Rheumatoid arthritis(714.0)    Dr Tobie Lords follows    Past Surgical History:  Procedure Laterality Date  . CYSTECTOMY Right    breast    Current Medications: Current Meds  Medication Sig  . albuterol (PROAIR HFA) 108 (90 Base) MCG/ACT inhaler Inhale 2 puffs into the lungs every 4 (four) hours as needed for wheezing or shortness of breath.  Marland Kitchen alendronate (FOSAMAX) 70 MG tablet Take 1 tablet (70 mg total) by mouth every 7 days.  Marland Kitchen amLODipine (NORVASC) 10 MG tablet Take 1 tablet (10 mg total) by mouth daily.  . budesonide-formoterol (SYMBICORT) 160-4.5 MCG/ACT inhaler Inhale 2 puffs into the lungs every 12 (twelve) hours.  . carvedilol (COREG) 3.125 MG tablet  Take 1 tablet (3.125 mg total) by mouth 2 (two) times daily with a meal.  . Cholecalciferol (VITAMIN D3 PO) Take 1 tablet by mouth daily.   . Cyanocobalamin (VITAMIN B-12 PO) Take by mouth.  . desloratadine (CLARINEX) 5 MG tablet Take 5 mg by mouth daily.  . fexofenadine (ALLEGRA) 180 MG tablet Take 180 mg by mouth daily.  . hydroxychloroquine (PLAQUENIL) 200 MG tablet 2 tabs twice daily  . leflunomide (ARAVA) 20 MG tablet Take 20 mg by mouth daily.  . montelukast (SINGULAIR) 10 MG tablet Take 1 tablet (10 mg total) by mouth at bedtime.  Marland Kitchen omeprazole (PRILOSEC) 20 MG capsule TAKE ONE CAPSULE BY MOUTH EVERY DAY  . predniSONE (DELTASONE) 5 MG tablet Take 5 mg by mouth daily with breakfast.  . Quercetin 250 MG TABS Take 500 mg by mouth daily.  . sertraline (ZOLOFT) 100 MG tablet Take 1 tablet by mouth daily.  . Tiotropium Bromide Monohydrate (SPIRIVA RESPIMAT) 2.5 MCG/ACT AERS Inhale 2 puffs into the lungs daily.  . valsartan (DIOVAN) 320 MG tablet Take 320 mg by mouth daily.  . [DISCONTINUED] ASPIRIN PO Take 325 mg by mouth daily.      Allergies:   Other   Social History   Socioeconomic History  . Marital status: Widowed    Spouse name: Not on file  . Number of children: Not on file  . Years of education: Not on file  . Highest education level: Not  on file  Occupational History  . Occupation: retired  Scientific laboratory technician  . Financial resource strain: Not on file  . Food insecurity    Worry: Not on file    Inability: Not on file  . Transportation needs    Medical: Not on file    Non-medical: Not on file  Tobacco Use  . Smoking status: Former Smoker    Packs/day: 1.00    Years: 40.00    Pack years: 40.00    Types: Cigarettes    Quit date: 02/03/2001    Years since quitting: 17.8  . Smokeless tobacco: Never Used  Substance and Sexual Activity  . Alcohol use: Yes    Alcohol/week: 2.0 standard drinks    Types: 2 Standard drinks or equivalent per week    Comment: 2 drinks daily  .  Drug use: No  . Sexual activity: Not on file  Lifestyle  . Physical activity    Days per week: Not on file    Minutes per session: Not on file  . Stress: Not on file  Relationships  . Social Herbalist on phone: Not on file    Gets together: Not on file    Attends religious service: Not on file    Active member of club or organization: Not on file    Attends meetings of clubs or organizations: Not on file    Relationship status: Not on file  Other Topics Concern  . Not on file  Social History Narrative   Tobacco use, amount per day now: NONE   Past tobacco use, amount per day: 1 PACK PER DAY   How many years did you use tobacco:TOO MANY STOPPED IN 2003   Alcohol use (drinks per week): 14   Diet: GOOD   Do you drink/eat things with caffeine: YES   Marital status: WIDOW                What year were you married? 1967   Do you live in a house, apartment, assisted living, condo, trailer, etc.? HOUSE   Is it one or more stories? SPLIT LEVEL   How many persons live in your home? 1   Do you have pets in your home?( please list) NONE   Current or past profession: [employer]   Do you exercise?  NO                                Type and how often?   Do you have a living will? YES    Do you have a DNR form?  YES                                 If not, do you want to discuss one?   Do you have signed POA/HPOA forms?        ??              If so, please bring to you appointment      [facility] (05/16/16):   Originally from [place], [place]. 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 No pets currently. No bird, mold, or hot tub exposure.      Family History: The patient's family history includes Breast cancer in her maternal aunt; Clotting disorder in her mother; Emphysema in her  paternal grandmother and sister; Heart disease in her father and sister; Sleep apnea in her sister; Stroke in her mother.  ROS:    Review of Systems  Constitution: Negative for decreased appetite, fever and weight gain.  HENT: Negative for congestion, ear discharge, hoarse voice and sore throat.   Eyes: Negative for discharge, redness, vision loss in right eye and visual halos.  Cardiovascular: Negative for chest pain, dyspnea on exertion, leg swelling, orthopnea and palpitations.  Respiratory: Negative for cough, hemoptysis, shortness of breath and snoring.   Endocrine: Negative for heat intolerance and polyphagia.  Hematologic/Lymphatic: Negative for bleeding problem. Does not bruise/bleed easily.  Skin: Negative for flushing, nail changes, rash and suspicious lesions.  Musculoskeletal: Negative for arthritis, joint pain, muscle cramps, myalgias, neck pain and stiffness.  Gastrointestinal: Negative for abdominal pain, bowel incontinence, diarrhea and excessive appetite.  Genitourinary: Negative for decreased libido, genital sores and incomplete emptying.  Neurological: Negative for brief paralysis, focal weakness, headaches and loss of balance.  Psychiatric/Behavioral: Negative for altered mental status, depression and suicidal ideas.  Allergic/Immunologic: Negative for HIV exposure and persistent infections.    EKGs/Labs/Other Studies Reviewed:    The following studies were reviewed today:   EKG: Atrial fibrillation, heart rate 86 bpm with RSR prime in precordial progression which could be suggestive of anterolateral infarction.    Pharmacologic nuclear stress test.  12/15/2018  Nuclear stress EF: 55%.  The left ventricular ejection fraction is normal (55-65%).  There was no ST segment deviation noted during stress.  Defect 1: There is a small defect of mild severity present in the apical inferior location.  Findings consistent with ischemia.  This is an intermediate risk study.  Small area of ischemia involving apical portion of the inferior wall.  Normal EF.  Transthoracic echocardiogram  11/29/2018 IMPRESSIONS   1. Left ventricular ejection fraction, by visual estimation, is 45 to 50%. The left ventricle has normal function. Normal left ventricular size. There is borderline left ventricular hypertrophy.  2. Left ventricular diastolic Doppler parameters are consistent with impaired relaxation pattern of LV diastolic filling.  3. Global right ventricle has normal systolic function.The right ventricular size is normal. No increase in right ventricular wall thickness.  4. Left atrial size was normal.  5. Right atrial size was normal.  6. The mitral valve is normal in structure. No evidence of mitral valve regurgitation. No evidence of mitral stenosis.  7. The tricuspid valve is normal in structure. Tricuspid valve regurgitation was not visualized by color flow Doppler.  8. The aortic valve is normal in structure. Aortic valve regurgitation was not visualized by color flow Doppler. Structurally normal aortic valve, with no evidence of sclerosis or stenosis.  9. The pulmonic valve was normal in structure. Pulmonic valve regurgitation is not visualized by color flow Doppler. 10. The inferior vena cava is normal in size with greater than 50% respiratory variability, suggesting right atrial pressure of 3 mmHg  Recent Labs: 11/16/2018: BUN 19; Creatinine, Ser 0.99; Potassium 4.9; Sodium 135  Recent Lipid Panel No results found for: CHOL, TRIG, HDL, CHOLHDL, VLDL, LDLCALC, LDLDIRECT  Physical Exam:    VS:  BP 140/82 (BP Location: Right Arm, Patient Position: Sitting, Cuff Size: Normal)   Pulse 83   Ht 5\' 1"  (1.549 m)   Wt 147 lb (66.7 kg)   SpO2 92%   BMI 27.78 kg/m     Wt Readings from Last 3 Encounters:  12/24/18 147 lb (66.7 kg)  12/15/18 148 lb (67.1 kg)  11/23/18 148 lb (67.1 kg)     GEN: Well nourished, well developed in no acute distress HEENT: Normal NECK: No JVD; No carotid bruits LYMPHATICS: No lymphadenopathy CARDIAC: S1S2 noted,RRR, no murmurs, rubs, gallops  RESPIRATORY:  Clear to auscultation without rales, wheezing or rhonchi  ABDOMEN: Soft, non-tender, non-distended, +bowel sounds, no guarding. EXTREMITIES: No edema, No cyanosis, no clubbing MUSCULOSKELETAL:  No edema; No deformity  SKIN: Warm and dry NEUROLOGIC:  Alert and oriented x 3, non-focal PSYCHIATRIC:  Normal affect, good insight  ASSESSMENT:    1. Abnormal nuclear stress test   2. Paroxysmal atrial fibrillation (HCC)   3. Pre-procedure lab exam   4. Essential hypertension   5. Mixed hyperlipidemia   6. Prediabetes    PLAN:    1.  Her nuclear stress performed on December 15, 2018 is positive for ischemia.  Given her persistent symptoms of shortness of breath and positive stress test I have discussed with the patient to see with a left heart catheterization.  We discussed in great detail about the testing.  I educated her about the importance, all of her questions were answered.  She is agreeable to perform this testing.  The patient understands that risks include but are not limited to stroke (1 in 1000), death (1 in 1), kidney failure [usually temporary] (1 in 500), bleeding (1 in 200), allergic reaction [possibly serious] (1 in 200), and agrees to proceed.  She has been started on 81 mg daily as well as atorvastatin 20 mg daily.  2.  Hypertension-she will be continued on her current antihypertensive medication.  3.  Atrial fibrillation-this is a new diagnosis. I have educated the patient on this. She is rate controlled today. She is currently on Coreg 3.125 mg twice daily. Her Chadsvasc score is 4.  Eliquis 5 mg twice daily.  The patient was educated about this highly diagnosis and this medication.  I have educated patient on the side effects of this medication. I also urge her to abstain from any taking behaviors.  She understands that she is now at a high risk of bleeding due to being on anticoagulation.  She was also advised that if she ever falls and especially hit her head  to be seen in the emergency department.  She also understands that if she ends up with PCI she may need to be on aspirin, Plavix and Eliquis for the first 30 days.   The patient is in agreement with the above plan. The patient left the office in stable condition.  The patient will follow up in 1 month.  Medication Adjustments/Labs and Tests Ordered: Current medicines are reviewed at length with the patient today.  Concerns regarding medicines are outlined above.   Orders Placed This Encounter  Procedures  . CBC  . Basic Metabolic Panel (BMET)  . EKG 12-Lead   Meds ordered this encounter  Medications  . aspirin EC 81 MG tablet    Sig: Take 1 tablet (81 mg total) by mouth daily.    Dispense:  90 tablet    Refill:  3  . atorvastatin (LIPITOR) 20 MG tablet    Sig: Take 1 tablet (20 mg total) by mouth daily.    Dispense:  90 tablet    Refill:  1    Patient Instructions  Medication Instructions:  Your physician has recommended you make the following change in your medication:   START: Aspirin 81 mg Take 1 tab daily START: Lipitor(atorvostatin) 20 mg Take 1 tab daily  *  If you need a refill on your cardiac medications before your next appointment, please call your pharmacy*  Lab Work: Your physician recommends that you return for lab work in: [place] CBC,BMP  If you have labs (blood work) drawn today and your tests are completely normal, you will receive your results only by: Marland Kitchen MyChart Message (if you have MyChart) OR . A paper copy in the mail If you have any lab test that is abnormal or we need to change your treatment, we will call you to review the results.  Testing/Procedures:    [facility] [place] [id number] Dept: [phone] Loc: [place]  12/24/2018  You are scheduled for a Cardiac Catheterization on Wednesday, December 2 with Dr. Larae Grooms.  1. Please arrive at the [facility] (Main Entrance A) at [facility]: [address] [place], [place] [address] at 9:30 AM (This time is two hours before your procedure to ensure your preparation). Free valet parking service is available.   Special note: Every effort is made to have your procedure done on time. Please understand that emergencies sometimes delay scheduled procedures.  2. Diet: Do not eat solid foods after midnight.  The patient may have clear liquids until 5am upon the day of the procedure.  3. Labs: You will need to have a COVID screening . Your appointment is Sat Nov 28 ,2020 at 1205 PM. Arrive at [facility] [place] [facility].   4. Medication instructions in preparation for your procedure:   Contrast Allergy: No  On the morning of your procedure, take your Aspirin and any morning medicines NOT listed above.  You may use sips of water.  5. Plan for one night stay--bring personal belongings. 6. Bring a current list of your medications and current insurance cards. 7. You MUST have a responsible person to drive you home. 8. Someone MUST be with you the first 24 hours after you arrive home or your discharge will be delayed. 9. Please wear clothes that are easy to get on and off and wear slip-on shoes.  Thank you for allowing [place] to care for you!   -- [facility]  Follow-Up: At [facility], you and your health needs are our priority.  As part of our continuing mission to provide you with exceptional heart care, we have created designated Provider Care Teams.  These Care Teams include your primary Cardiologist (physician) and Advanced Practice Providers (APPs -  Physician Assistants and Nurse Practitioners) who all work together to provide you with the care you need, when you need it.  Your next appointment:   1 month(s)  The format for your next appointment:   In Person  Provider:   Berniece Salines, DO  Other  Instructions      Adopting a Healthy Lifestyle.  Know what a healthy weight is for you (roughly BMI <25) and aim to maintain this   Aim for 7+ servings of fruits and vegetables daily   65-80+ fluid ounces of water or unsweet tea for healthy kidneys   Limit to max 1 drink of alcohol per day; avoid smoking/tobacco   Limit animal fats in diet for cholesterol and heart health - choose grass fed whenever available   Avoid highly processed foods, and foods high in saturated/trans fats   Aim for low stress - take time to unwind and care for your mental health  Aim for 150 min of moderate intensity exercise weekly for heart health, and weights twice weekly for bone health   Aim for 7-9 hours of sleep daily   When it comes to diets, agreement about the perfect plan isnt easy to find, even among the experts. Experts at the [employer] developed an idea known as the Healthy Eating Plate. Just imagine a plate divided into logical, healthy portions.   The emphasis is on diet quality:   Load up on vegetables and fruits - one-half of your plate: Aim for color and variety, and remember that potatoes dont count.   Go for whole grains - one-quarter of your plate: Whole wheat, barley, wheat berries, quinoa, oats, brown rice, and foods made with them. If you want pasta, go with whole wheat pasta.   Protein power - one-quarter of your plate: Fish, chicken, beans, and nuts are all healthy, versatile protein sources. Limit red meat.   The diet, however, does go beyond the plate, offering a few other suggestions.   Use healthy plant oils, such as olive, canola, soy, corn, sunflower and peanut. Check the labels, and avoid partially hydrogenated oil, which have unhealthy trans fats.   If youre thirsty, drink water. Coffee and tea are good in moderation, but skip sugary drinks and limit milk and dairy products to one or two daily servings.   The type of carbohydrate in the diet  is more important than the amount. Some sources of carbohydrates, such as vegetables, fruits, whole grains, and beans-are healthier than others.   Finally, stay active  Signed, Berniece Salines, DO  12/24/2018 1:48 PM    [facility]

## 2018-12-24 NOTE — Addendum Note (Signed)
Addended by: Particia Nearing B on: 12/24/2018 02:35 PM   Modules accepted: Orders

## 2018-12-24 NOTE — Addendum Note (Signed)
Addended by: Berniece Salines on: 12/24/2018 12:44 PM   Modules accepted: Orders, SmartSet

## 2018-12-25 LAB — BASIC METABOLIC PANEL
BUN/Creatinine Ratio: 21 (ref 12–28)
BUN: 18 mg/dL (ref 8–27)
CO2: 22 mmol/L (ref 20–29)
Calcium: 9.3 mg/dL (ref 8.7–10.3)
Chloride: 102 mmol/L (ref 96–106)
Creatinine, Ser: 0.87 mg/dL (ref 0.57–1.00)
GFR calc Af Amer: 75 mL/min/{1.73_m2} (ref >59)
GFR calc non Af Amer: 65 mL/min/{1.73_m2} (ref >59)
Glucose: 94 mg/dL (ref 65–99)
Potassium: 5.1 mmol/L (ref 3.5–5.2)
Sodium: 139 mmol/L (ref 134–144)

## 2018-12-25 LAB — CBC
Hematocrit: 33.7 % — ABNORMAL LOW (ref 34.0–46.6)
Hemoglobin: 10.8 g/dL — ABNORMAL LOW (ref 11.1–15.9)
MCH: 29.9 pg (ref 26.6–33.0)
MCHC: 32 g/dL (ref 31.5–35.7)
MCV: 93 fL (ref 79–97)
Platelets: 356 10*3/uL (ref 150–450)
RBC: 3.61 x10E6/uL — ABNORMAL LOW (ref 3.77–5.28)
RDW: 13 % (ref 11.7–15.4)
WBC: 12.3 10*3/uL — ABNORMAL HIGH (ref 3.4–10.8)

## 2018-12-28 ENCOUNTER — Telehealth: Payer: Self-pay | Admitting: Cardiology

## 2018-12-28 NOTE — Telephone Encounter (Signed)
Wants you to call her in reference to her situation after a prcedure she is having

## 2018-12-28 NOTE — Telephone Encounter (Signed)
Please call patient regarding her COVID test

## 2018-12-28 NOTE — Telephone Encounter (Signed)
Telephone call to patient. Asking if [facility] will be open Saturday. Informed that the schedule was open to place tests so as far I as know it will be open for her covid screening.

## 2018-12-29 NOTE — Telephone Encounter (Signed)
Telephone call to patient. Asking if she needs someone 24 hours to stay with her after the cardiac cath. States has a sister - in -law who is willing but she also lives in an facility that has emergency pull cords in her apartment. Informed her that would do well also but have her sister -in - law to stay as well.

## 2018-12-31 ENCOUNTER — Other Ambulatory Visit (HOSPITAL_COMMUNITY): Payer: Medicare Other

## 2019-01-01 ENCOUNTER — Other Ambulatory Visit (HOSPITAL_COMMUNITY)
Admission: RE | Admit: 2019-01-01 | Discharge: 2019-01-01 | Disposition: A | Discharge status: Home / Self Care | Payer: Medicare Other | Source: Ambulatory Visit | Attending: Interventional Cardiology | Admitting: Interventional Cardiology

## 2019-01-01 DIAGNOSIS — Z20828 Contact with and (suspected) exposure to other viral communicable diseases: Secondary | ICD-10-CM | POA: Insufficient documentation

## 2019-01-01 DIAGNOSIS — Z01812 Encounter for preprocedural laboratory examination: Secondary | ICD-10-CM | POA: Insufficient documentation

## 2019-01-02 LAB — NOVEL CORONAVIRUS, NAA (HOSP ORDER, SEND-OUT TO REF LAB; TAT 18-24 HRS): SARS-CoV-2, NAA: NOT DETECTED

## 2019-01-04 ENCOUNTER — Telehealth: Payer: Self-pay | Admitting: *Deleted

## 2019-01-04 ENCOUNTER — Inpatient Hospital Stay: Admission: RE | Admit: 2019-01-04 | Payer: Medicare Other | Source: Ambulatory Visit

## 2019-01-04 NOTE — Telephone Encounter (Addendum)
Pt contacted pre-catheterization scheduled at [facility] for: Wednesday January 05, 2019 11:30 AM Verified arrival time and place: [facility]) at: 9:30 AM   No solid food after midnight prior to cath, clear liquids until 5 AM day of procedure. Contrast allergy: no  Hold: Eliquis-none 01/03/19 until post procedure.  Except hold medications AM meds can be  taken pre-cath with sip of water including: ASA 81 mg   Confirmed patient has responsible adult to drive home post procedure and observe 24 hours after arriving home: yes  Currently, due to Covid-19 pandemic, only one support person will be allowed with patient. Must be the same support person for that patient's entire stay, will be screened and required to wear a mask. They will be asked to wait in the waiting room for the duration of the patient's stay.  Patients are required to wear a mask when they enter the hospital.      COVID-19 Pre-Screening Questions:  . In the past 7 to 10 days have you had a cough,  shortness of breath, headache, congestion, fever (100 or greater) body aches, chills, sore throat, or sudden loss of taste or sense of smell? no . Have you been around anyone with known Covid 19? no . Have you been around anyone who is awaiting Covid 19 test results in the past 7 to 10 days? no . Have you been around anyone who has been exposed to Covid 19, or has mentioned symptoms of Covid 19 within the past 7 to 10 days? no   I reviewed procedure/mask/visitor instructions, Covid-19 screening questions with patient,she verbalized understanding, thanked me for call.

## 2019-01-04 NOTE — Telephone Encounter (Signed)
Patient is returning call.  °

## 2019-01-05 ENCOUNTER — Other Ambulatory Visit: Payer: Self-pay

## 2019-01-05 ENCOUNTER — Encounter (HOSPITAL_COMMUNITY)
Admission: RE | Disposition: A | Discharge status: Home / Self Care | Payer: Medicare Other | Source: Home / Self Care | Attending: Interventional Cardiology

## 2019-01-05 ENCOUNTER — Ambulatory Visit (HOSPITAL_COMMUNITY)
Admission: RE | Admit: 2019-01-05 | Discharge: 2019-01-05 | Disposition: A | Discharge status: Home / Self Care | Payer: Medicare Other | Attending: Interventional Cardiology | Admitting: Interventional Cardiology

## 2019-01-05 DIAGNOSIS — I480 Paroxysmal atrial fibrillation: Secondary | ICD-10-CM | POA: Diagnosis not present

## 2019-01-05 DIAGNOSIS — Z86711 Personal history of pulmonary embolism: Secondary | ICD-10-CM | POA: Diagnosis not present

## 2019-01-05 DIAGNOSIS — Z87891 Personal history of nicotine dependence: Secondary | ICD-10-CM | POA: Insufficient documentation

## 2019-01-05 DIAGNOSIS — I2510 Atherosclerotic heart disease of native coronary artery without angina pectoris: Secondary | ICD-10-CM | POA: Diagnosis not present

## 2019-01-05 DIAGNOSIS — Z8249 Family history of ischemic heart disease and other diseases of the circulatory system: Secondary | ICD-10-CM | POA: Diagnosis not present

## 2019-01-05 DIAGNOSIS — Z7982 Long term (current) use of aspirin: Secondary | ICD-10-CM | POA: Insufficient documentation

## 2019-01-05 DIAGNOSIS — I10 Essential (primary) hypertension: Secondary | ICD-10-CM | POA: Diagnosis not present

## 2019-01-05 DIAGNOSIS — R9439 Abnormal result of other cardiovascular function study: Secondary | ICD-10-CM | POA: Diagnosis not present

## 2019-01-05 DIAGNOSIS — E782 Mixed hyperlipidemia: Secondary | ICD-10-CM | POA: Diagnosis not present

## 2019-01-05 DIAGNOSIS — M1990 Unspecified osteoarthritis, unspecified site: Secondary | ICD-10-CM | POA: Insufficient documentation

## 2019-01-05 DIAGNOSIS — Z79899 Other long term (current) drug therapy: Secondary | ICD-10-CM | POA: Diagnosis not present

## 2019-01-05 DIAGNOSIS — Z7901 Long term (current) use of anticoagulants: Secondary | ICD-10-CM | POA: Insufficient documentation

## 2019-01-05 DIAGNOSIS — M069 Rheumatoid arthritis, unspecified: Secondary | ICD-10-CM | POA: Insufficient documentation

## 2019-01-05 DIAGNOSIS — J449 Chronic obstructive pulmonary disease, unspecified: Secondary | ICD-10-CM | POA: Diagnosis not present

## 2019-01-05 DIAGNOSIS — R7303 Prediabetes: Secondary | ICD-10-CM | POA: Insufficient documentation

## 2019-01-05 DIAGNOSIS — Z7951 Long term (current) use of inhaled steroids: Secondary | ICD-10-CM | POA: Diagnosis not present

## 2019-01-05 HISTORY — PX: LEFT HEART CATH AND CORONARY ANGIOGRAPHY: CATH118249

## 2019-01-05 SURGERY — LEFT HEART CATH AND CORONARY ANGIOGRAPHY
Anesthesia: LOCAL

## 2019-01-05 MED ORDER — SODIUM CHLORIDE 0.9% FLUSH: 3.0000 mL | Freq: Two times a day (BID) | INTRAVENOUS | Status: DC

## 2019-01-05 MED ORDER — VERAPAMIL HCL 2.5 MG/ML IV SOLN
INTRAVENOUS | Status: AC
  Filled 2019-01-05: qty 2

## 2019-01-05 MED ORDER — HEPARIN SODIUM (PORCINE) 1000 UNIT/ML IJ SOLN
INTRAMUSCULAR | Status: DC | PRN
  Administered 2019-01-05: 3500 [IU] via INTRAVENOUS

## 2019-01-05 MED ORDER — VERAPAMIL HCL 2.5 MG/ML IV SOLN
INTRAVENOUS | Status: DC | PRN
  Administered 2019-01-05: 10 mL via INTRA_ARTERIAL

## 2019-01-05 MED ORDER — HEPARIN (PORCINE) IN NACL 1000-0.9 UT/500ML-% IV SOLN
INTRAVENOUS | Status: DC | PRN
  Administered 2019-01-05 (×3): 500 mL

## 2019-01-05 MED ORDER — APIXABAN 5 MG PO TABS: 5.0000 mg | ORAL_TABLET | Freq: Two times a day (BID) | ORAL | 5 refills | Status: DC

## 2019-01-05 MED ORDER — ACETAMINOPHEN 325 MG PO TABS: 650.0000 mg | ORAL_TABLET | ORAL | Status: DC | PRN

## 2019-01-05 MED ORDER — LABETALOL HCL 5 MG/ML IV SOLN: 10.0000 mg | INTRAVENOUS | Status: DC | PRN

## 2019-01-05 MED ORDER — MIDAZOLAM HCL 2 MG/2ML IJ SOLN
INTRAMUSCULAR | Status: AC
  Filled 2019-01-05: qty 2

## 2019-01-05 MED ORDER — HEPARIN (PORCINE) IN NACL 1000-0.9 UT/500ML-% IV SOLN
INTRAVENOUS | Status: AC
  Filled 2019-01-05: qty 1000

## 2019-01-05 MED ORDER — SODIUM CHLORIDE 0.9 % IV SOLN: INTRAVENOUS | Status: AC

## 2019-01-05 MED ORDER — SODIUM CHLORIDE 0.9 % IV SOLN: 250.0000 mL | INTRAVENOUS | Status: DC | PRN

## 2019-01-05 MED ORDER — FENTANYL CITRATE (PF) 100 MCG/2ML IJ SOLN
INTRAMUSCULAR | Status: AC
  Filled 2019-01-05: qty 2

## 2019-01-05 MED ORDER — MIDAZOLAM HCL 2 MG/2ML IJ SOLN
INTRAMUSCULAR | Status: DC | PRN
  Administered 2019-01-05: 2 mg via INTRAVENOUS

## 2019-01-05 MED ORDER — IOHEXOL 350 MG/ML SOLN
INTRAVENOUS | Status: DC | PRN
  Administered 2019-01-05: 55 mL via INTRA_ARTERIAL

## 2019-01-05 MED ORDER — LIDOCAINE HCL (PF) 1 % IJ SOLN
INTRAMUSCULAR | Status: AC
  Filled 2019-01-05: qty 30

## 2019-01-05 MED ORDER — SODIUM CHLORIDE 0.9% FLUSH: 3.0000 mL | INTRAVENOUS | Status: DC | PRN

## 2019-01-05 MED ORDER — FENTANYL CITRATE (PF) 100 MCG/2ML IJ SOLN
INTRAMUSCULAR | Status: DC | PRN
  Administered 2019-01-05: 50 ug via INTRAVENOUS

## 2019-01-05 MED ORDER — ONDANSETRON HCL 4 MG/2ML IJ SOLN: 4.0000 mg | Freq: Four times a day (QID) | INTRAMUSCULAR | Status: DC | PRN

## 2019-01-05 MED ORDER — HYDRALAZINE HCL 20 MG/ML IJ SOLN: 10.0000 mg | INTRAMUSCULAR | Status: DC | PRN

## 2019-01-05 MED ORDER — SODIUM CHLORIDE 0.9 % WEIGHT BASED INFUSION: 1.0000 mL/kg/h | INTRAVENOUS | Status: DC

## 2019-01-05 MED ORDER — LIDOCAINE HCL (PF) 1 % IJ SOLN
INTRAMUSCULAR | Status: DC | PRN
  Administered 2019-01-05: 2 mL

## 2019-01-05 MED ORDER — HEPARIN SODIUM (PORCINE) 1000 UNIT/ML IJ SOLN
INTRAMUSCULAR | Status: AC
  Filled 2019-01-05: qty 1

## 2019-01-05 MED ORDER — SODIUM CHLORIDE 0.9 % WEIGHT BASED INFUSION
3.0000 mL/kg/h | INTRAVENOUS | Status: AC
  Administered 2019-01-05: 3 mL/kg/h via INTRAVENOUS

## 2019-01-05 SURGICAL SUPPLY — 10 items
CATH 5FR JL3.5 JR4 ANG PIG MP (CATHETERS) ×1 IMPLANT
DEVICE RAD COMP TR BAND LRG (VASCULAR PRODUCTS) ×1 IMPLANT
GLIDESHEATH SLEND SS 6F .021 (SHEATH) ×1 IMPLANT
GUIDEWIRE INQWIRE 1.5J.035X260 (WIRE) IMPLANT
INQWIRE 1.5J .035X260CM (WIRE) ×2
KIT HEART LEFT (KITS) ×2 IMPLANT
PACK CARDIAC CATHETERIZATION (CUSTOM PROCEDURE TRAY) ×2 IMPLANT
SHEATH PROBE COVER 6X72 (BAG) ×1 IMPLANT
TRANSDUCER W/STOPCOCK (MISCELLANEOUS) ×2 IMPLANT
TUBING CIL FLEX 10 FLL-RA (TUBING) ×2 IMPLANT

## 2019-01-05 NOTE — Discharge Instructions (Signed)
Radial Site Care ° °This sheet gives you information about how to care for yourself after your procedure. Your health care provider may also give you more specific instructions. If you have problems or questions, contact your health care provider. °What can I expect after the procedure? °After the procedure, it is common to have: °· Bruising and tenderness at the catheter insertion area. °Follow these instructions at home: °Medicines °· Take over-the-counter and prescription medicines only as told by your health care provider. °Insertion site care °· Follow instructions from your health care provider about how to take care of your insertion site. Make sure you: °? Wash your hands with soap and water before you change your bandage (dressing). If soap and water are not available, use hand sanitizer. °? Change your dressing as told by your health care provider. °? Leave stitches (sutures), skin glue, or adhesive strips in place. These skin closures may need to stay in place for 2 weeks or longer. If adhesive strip edges start to loosen and curl up, you may trim the loose edges. Do not remove adhesive strips completely unless your health care provider tells you to do that. °· Check your insertion site every day for signs of infection. Check for: °? Redness, swelling, or pain. °? Fluid or blood. °? Pus or a bad smell. °? Warmth. °· Do not take baths, swim, or use a hot tub until your health care provider approves. °· You may shower 24-48 hours after the procedure, or as directed by your health care provider. °? Remove the dressing and gently wash the site with plain soap and water. °? Pat the area dry with a clean towel. °? Do not rub the site. That could cause bleeding. °· Do not apply powder or lotion to the site. °Activity ° °· For 24 hours after the procedure, or as directed by your health care provider: °? Do not flex or bend the affected arm. °? Do not push or pull heavy objects with the affected arm. °? Do not  drive yourself home from the hospital or clinic. You may drive 24 hours after the procedure unless your health care provider tells you not to. °? Do not operate machinery or power tools. °· Do not lift anything that is heavier than 10 lb (4.5 kg), or the limit that you are told, until your health care provider says that it is safe. °· Ask your health care provider when it is okay to: °? Return to work or school. °? Resume usual physical activities or sports. °? Resume sexual activity. °General instructions °· If the catheter site starts to bleed, raise your arm and put firm pressure on the site. If the bleeding does not stop, get help right away. This is a medical emergency. °· If you went home on the same day as your procedure, a responsible adult should be with you for the first 24 hours after you arrive home. °· Keep all follow-up visits as told by your health care provider. This is important. °Contact a health care provider if: °· You have a fever. °· You have redness, swelling, or yellow drainage around your insertion site. °Get help right away if: °· You have unusual pain at the radial site. °· The catheter insertion area swells very fast. °· The insertion area is bleeding, and the bleeding does not stop when you hold steady pressure on the area. °· Your arm or hand becomes pale, cool, tingly, or numb. °These symptoms may represent a serious problem   that is an emergency. Do not wait to see if the symptoms will go away. Get medical help right away. Call your local emergency services (911 in the [place] Do not drive yourself to the hospital. °Summary °· After the procedure, it is common to have bruising and tenderness at the site. °· Follow instructions from your health care provider about how to take care of your radial site wound. Check the wound every day for signs of infection. °· Do not lift anything that is heavier than 10 lb (4.5 kg), or the limit that you are told, until your health care provider says  that it is safe. °This information is not intended to replace advice given to you by your health care provider. Make sure you discuss any questions you have with your health care provider. °Document Released: 02/22/2010 Document Revised: 02/25/2017 Document Reviewed: 02/25/2017 °Elsevier Patient Education © 2020 [employer] ° °

## 2019-01-05 NOTE — Interval H&P Note (Signed)
Cath Lab Visit (complete for each Cath Lab visit)  Clinical Evaluation Leading to the Procedure:   ACS: No.  Non-ACS:    Anginal Classification: CCS III  Anti-ischemic medical therapy: Minimal Therapy (1 class of medications)  Non-Invasive Test Results: Intermediate-risk stress test findings: cardiac mortality 1-3%/year  Prior CABG: No previous CABG      History and Physical Interval Note:  01/05/2019 11:13 AM  Desiree [name]  has presented today for surgery, with the diagnosis of positive stress test.  The various methods of treatment have been discussed with the patient and family. After consideration of risks, benefits and other options for treatment, the patient has consented to  Procedure(s): LEFT HEART CATH AND CORONARY ANGIOGRAPHY (N/A) as a surgical intervention.  The patient's history has been reviewed, patient examined, no change in status, stable for surgery.  I have reviewed the patient's chart and labs.  Questions were answered to the patient's satisfaction.     Larae Grooms

## 2019-01-06 ENCOUNTER — Encounter (HOSPITAL_COMMUNITY): Payer: Self-pay | Admitting: Interventional Cardiology

## 2019-01-12 ENCOUNTER — Ambulatory Visit (INDEPENDENT_AMBULATORY_CARE_PROVIDER_SITE_OTHER)
Admission: RE | Admit: 2019-01-12 | Discharge: 2019-01-12 | Disposition: A | Discharge status: Home / Self Care | Payer: Medicare Other | Source: Ambulatory Visit | Attending: Pulmonary Disease | Admitting: Pulmonary Disease

## 2019-01-12 ENCOUNTER — Other Ambulatory Visit: Payer: Self-pay

## 2019-01-12 DIAGNOSIS — R911 Solitary pulmonary nodule: Secondary | ICD-10-CM | POA: Diagnosis not present

## 2019-01-17 ENCOUNTER — Ambulatory Visit: Payer: Medicare Other | Admitting: Cardiology

## 2019-01-20 ENCOUNTER — Other Ambulatory Visit: Payer: Self-pay

## 2019-01-20 ENCOUNTER — Telehealth: Payer: Self-pay | Admitting: Pulmonary Disease

## 2019-01-20 ENCOUNTER — Ambulatory Visit (INDEPENDENT_AMBULATORY_CARE_PROVIDER_SITE_OTHER): Payer: Medicare Other | Admitting: Pulmonary Disease

## 2019-01-20 DIAGNOSIS — R918 Other nonspecific abnormal finding of lung field: Secondary | ICD-10-CM

## 2019-01-20 DIAGNOSIS — R911 Solitary pulmonary nodule: Secondary | ICD-10-CM | POA: Diagnosis not present

## 2019-01-20 DIAGNOSIS — J479 Bronchiectasis, uncomplicated: Secondary | ICD-10-CM | POA: Diagnosis not present

## 2019-01-20 DIAGNOSIS — J449 Chronic obstructive pulmonary disease, unspecified: Secondary | ICD-10-CM | POA: Diagnosis not present

## 2019-01-20 MED ORDER — OMEPRAZOLE 20 MG PO CPDR: 20.0000 mg | DELAYED_RELEASE_CAPSULE | Freq: Every day | ORAL | 1 refills | Status: DC

## 2019-01-20 NOTE — Patient Instructions (Signed)
Thank you for visiting Dr. Valeta Harms at [facility]. Today we recommend the following: Orders Placed This Encounter  Procedures  . Respiratory or Resp and Sputum Culture  . Fungus Culture & Smear  . AFB Culture & Smear  . Pulse oximetry, overnight   Walk tomorrow in office  Sputum cultures  Televisit in 1 week  Return in about 1 week (around 01/27/2019).    Please do your part to reduce the spread of COVID-19.

## 2019-01-20 NOTE — Telephone Encounter (Signed)
Checked pt's meds and saw that the rx was not called in after OV. Called and spoke with pt letting her know that I was going to take care of sending Rx to pharmacy for her and she verbalized understanding. Rx sent in for pt. Nothing further needed.

## 2019-01-20 NOTE — Progress Notes (Signed)
Synopsis: Referred in September 2020 for establish care with new pulmonologist former patient of Dr. Lake Bells, PCP: Raina Mina., MD  Subjective:   PATIENT ID: Desiree [name], Desiree [name]  Chief Complaint  Patient presents with  . Follow-up    Televisit    73 year old female past medical history of COPD, pulmonary embolism, rheumatoid arthritis, 5 mg prednisone plus Plaquenil, former patient of Dr. Lake Bells.  COPD currently managed with Trelegy.  Has had several images following lung nodules since 2017.  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  PET scan July 2019 showed FDG activity SUV 1.348 mm left lower lobe nodule.  IgE 158, Rast panel positive for cat dander, dog dander and Aspergillus fumigate [place].  Last evaluation included sputum cultures, fungus AFB.  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  Appears patient had exacerbation in February and was treated with Levaquin.  As for her RA she is currently on Plaquenil.  Overall she has seen a significant decline in her physical ability.  She states that she is really noticed a significant increase in her dyspnea over the past several months.  Otherwise her respiratory complaints related to her COPD have been stable.  She uses her Trelegy inhaler regularly.  She occasionally describes a central "abnormal feeling in her chest with exertion but most of the time this all dissipates with resting.  She states even the time yes tasks make her severely fatigued as come paired to previous.  Patient denies fevers chills weight loss hemoptysis.  OV 11/08/2018: (Via telephone visit).  Patient scheduled for telephone visit to review CT imaging.  Patient found to have a 2 x 1.3 cm nodule abutting the minor fissure discussed via telephone today.  OV 11/23/2018: Here today to review CT imaging in person.   Patient had pulmonary function test completed today prior to today's office visit.  PFTs revealed a ratio of 62, FEV1 51% predicted..  Also to review her CT imaging.  Patient very anxious about all of this that is going on.  She recently changed from Trelegy to Spiriva plus Symbicort.  She is doing well with her new inhaler regimen.  She feels less dyspneic.  Patient denies fevers chills night sweats weight loss, cough or sputum production.  OV 01/20/2019:  Virtual Visit via Telephone Note  I connected withBrenda Enid [name] on 01/20/19 at  2:15 PM EST by telephoneand verified that I am speaking with the correct person using two identifiers.  Location: Patient: Desiree [name] Provider: Garner Nash, DO   I discussed the limitations, risks, security and privacy concerns of performing an evaluation and management service by telephone and the availability of in person appointments. I also discussed with the patient that there may be a patient responsible charge related to this service. The patient expressed understanding and agreed to proceed.   History of Present Illness: Patient has complaints today of progressive dyspnea.  She currently has significant dyspnea with working around the house trying to get dressed or even go to the grocery store.  She states she has to stop 2 or 3 times lay down and rest because her dyspnea has increased.  She has been using her inhalers daily like she is supposed to.  She has not been using her albuterol.  She only uses that at nighttime before she goes to bed when she wheezes on occasion.  She does have a nebulizer at home but also has not been using this.  She recently had a repeat CAT scan for follow-up of lung nodule.  This right lung nodule has resolved.  She does have persistent findings consistent with MAI including tree-in-bud opacities and chronic bronchitis type changes.  She feels like she may need oxygen.  She lost her SPO2 monitor at home and has  not been able to monitor her oxygen needs.  Observations/Objective: Able to speak in complete sentences.  Nonlabored breathing  CT scan of the chest: Right lung nodule resolution, tree-in-bud opacities and evidence of what would be considered chronic infection such as MAI. The patient's images have been independently reviewed by me.    Left heart catheterization: No severe obstruction.  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 Plan to walk in the office tomorrow to see if she qualifies for O2 need. Continue current inhaler regimen. Encourage patient to use nebulizer when she has acute episodes of shortness of breath at home or needs this prior to any significant exertional event. Patient was counseled on this. As for the possible MAI on CT I have ordered respiratory cultures of her sputum to include aerobic, anaerobic, fungus and AFB. Continue on triple therapy inhaler regimen.  Plus as needed albuterol Next step for management of COPD dyspnea symptoms would include items such as Daliresp or azithromycin Monday Wednesday Friday.  I would prefer to prove that she does not have MAI prior to starting azithromycin we may not be able to do this with sputum production alone. May consider Daliresp however we will need to counsel on risk of diarrhea.  Follow Up Instructions: Follow-up in clinic tomorrow for nurses walk to assess oxygen need  Telemetry visit with TP in 1 week  I discussed the assessment and treatment plan with the patient. The patient was provided an opportunity to ask questions and all were answered. The patient agreed with the plan and demonstrated an understanding of the instructions.  The patient was advised to call back or seek an in-person evaluation if the  symptoms worsen or if the condition fails to improve as anticipated.  I provided 22 minutes of non-face-to-face time during this encounter.   Garner Nash, DO      Past Medical History:  Diagnosis Date  . Cat allergies   . COPD (chronic obstructive pulmonary disease) ([place])   . Hypertension   . Osteoporosis   . Pulmonary embolism ([place]) 2017  . Rheumatoid arthritis(714.0)    Dr Tobie Lords follows     Family History  Problem Relation Age of Onset  . Heart disease Father   . Stroke Mother   . Clotting disorder Mother   . Heart disease Sister   . Sleep apnea Sister   . Emphysema Sister   . Breast cancer Maternal Aunt   . Emphysema Paternal Grandmother      Past Surgical History:  Procedure Laterality Date  . CYSTECTOMY Right    breast  . LEFT HEART CATH AND CORONARY ANGIOGRAPHY N/A 01/05/2019   Procedure: LEFT HEART CATH AND CORONARY ANGIOGRAPHY;  Surgeon: Jettie Booze, MD;  Location: [facility];  Service: Cardiovascular;  Laterality: N/A;    Social History   Socioeconomic History  . Marital status: Widowed    Spouse name:  Not on file  . Number of children: Not on file  . Years of education: Not on file  . Highest education level: Not on file  Occupational History  . Occupation: retired  Tobacco Use  . Smoking status: Former Smoker    Packs/day: 1.00    Years: 40.00    Pack years: 40.00    Types: Cigarettes    Quit date: 02/03/2001    Years since quitting: 17.9  . Smokeless tobacco: Never Used  Substance and Sexual Activity  . Alcohol use: Yes    Alcohol/week: 2.0 standard drinks    Types: 2 Standard drinks or equivalent per week    Comment: 2 drinks daily  . Drug use: No  . Sexual activity: Not on file  Other Topics Concern  . Not on file  Social History Narrative   Tobacco use, amount per day now: NONE   Past tobacco use, amount per day: 1 PACK PER DAY   How many years did you use tobacco:TOO MANY STOPPED IN 2003   Alcohol use  (drinks per week): 14   Diet: GOOD   Do you drink/eat things with caffeine: YES   Marital status: WIDOW                What year were you married? 1967   Do you live in a house, apartment, assisted living, condo, trailer, etc.? HOUSE   Is it one or more stories? SPLIT LEVEL   How many persons live in your home? 1   Do you have pets in your home?( please list) NONE   Current or past profession: [employer]   Do you exercise?  NO                                Type and how often?   Do you have a living will? YES    Do you have a DNR form?  YES                                 If not, do you want to discuss one?   Do you have signed POA/HPOA forms?        ??              If so, please bring to you appointment      [facility] (05/16/16):   Originally from [place], [place]. 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 No pets currently. No bird, mold, or hot tub exposure.    Social Determinants of Health   Financial Resource Strain:   . Difficulty of Paying Living Expenses: Not on file  Food Insecurity:   . Worried About Charity fundraiser in the Last Year: Not on file  . Ran Out of Food in the Last Year: Not on file  Transportation Needs:   . Lack of Transportation (Medical): Not on file  . Lack of Transportation (Non-Medical): Not on file  Physical Activity:   . Days of Exercise per Week: Not on file  . Minutes of Exercise per Session: Not on file  Stress:   . Feeling of Stress : Not on file  Social Connections:   . Frequency of Communication with Friends and Family: Not on file  . Frequency of Social Gatherings with Friends and Family: Not on file  .  Attends Religious Services: Not on file  . Active Member of Clubs or Organizations: Not on file  . Attends Archivist Meetings: Not on file  . Marital Status: Not on file  Intimate Partner Violence:   . Fear of Current or Ex-Partner: Not on file  .  Emotionally Abused: Not on file  . Physically Abused: Not on file  . Sexually Abused: Not on file     Allergies  Allergen Reactions  . Other Shortness Of Breath    Cats     Outpatient Medications Prior to Visit  Medication Sig Dispense Refill  . albuterol (PROAIR HFA) 108 (90 Base) MCG/ACT inhaler Inhale 2 puffs into the lungs every 4 (four) hours as needed for wheezing or shortness of breath.    Marland Kitchen albuterol (PROVENTIL) (2.5 MG/3ML) 0.083% nebulizer solution Take 2.5 mg by nebulization every 6 (six) hours as needed for wheezing or shortness of breath.    Marland Kitchen alendronate (FOSAMAX) 70 MG tablet Take 70 mg by mouth once a week. Saturday    . amLODipine (NORVASC) 10 MG tablet Take 1 tablet (10 mg total) by mouth daily. 30 tablet 3  . apixaban (ELIQUIS) 5 MG TABS tablet Take 1 tablet (5 mg total) by mouth 2 (two) times daily. 60 tablet 5  . atorvastatin (LIPITOR) 20 MG tablet Take 1 tablet (20 mg total) by mouth daily. 90 tablet 1  . budesonide-formoterol (SYMBICORT) 160-4.5 MCG/ACT inhaler Inhale 2 puffs into the lungs every 12 (twelve) hours. 1 Inhaler 11  . carvedilol (COREG) 3.125 MG tablet Take 1 tablet (3.125 mg total) by mouth 2 (two) times daily with a meal. 180 tablet 1  . cholecalciferol (VITAMIN D) 25 MCG (1000 UT) tablet Take 1,000 Units by mouth daily.     . fexofenadine (ALLEGRA) 180 MG tablet Take 180 mg by mouth daily.    . hydroxychloroquine (PLAQUENIL) 200 MG tablet Take 200 mg by mouth 2 (two) times daily.   3  . montelukast (SINGULAIR) 10 MG tablet Take 1 tablet (10 mg total) by mouth at bedtime. (Patient taking differently: Take 10 mg by mouth daily. ) 90 tablet 0  . omeprazole (PRILOSEC) 20 MG capsule TAKE ONE CAPSULE BY MOUTH EVERY DAY (Patient taking differently: Take 20 mg by mouth daily. ) 30 capsule 1  . predniSONE (DELTASONE) 5 MG tablet Take 5 mg by mouth daily with breakfast.    . Quercetin 250 MG TABS Take 500 mg by mouth daily.    . sertraline (ZOLOFT) 100 MG  tablet Take 100 mg by mouth daily.     . Tiotropium Bromide Monohydrate (SPIRIVA RESPIMAT) 2.5 MCG/ACT AERS Inhale 2 puffs into the lungs daily. 4 g 11  . valsartan (DIOVAN) 320 MG tablet Take 320 mg by mouth daily.    . vitamin B-12 (CYANOCOBALAMIN) 1000 MCG tablet Take 1,000 mcg by mouth daily.      No facility-administered medications prior to visit.    ROS   Objective:  Physical Exam   There were no vitals filed for this visit.   on RA BMI Readings from Last 3 Encounters:  01/05/19 27.21 kg/m  12/24/18 27.78 kg/m  12/15/18 27.96 kg/m   Wt Readings from Last 3 Encounters:  01/05/19 144 lb (65.3 kg)  12/24/18 147 lb (66.7 kg)  12/15/18 148 lb (67.1 kg)     CBC    Component Value Date/Time   WBC 12.3 (H) 12/24/2018 1214   WBC 3.4 (L) 11/24/2014 0029   RBC 3.61 (  L) 12/24/2018 1214   RBC 3.09 (L) 11/24/2014 0029   HGB 10.8 (L) 12/24/2018 1214   HCT 33.7 (L) 12/24/2018 1214   PLT 356 12/24/2018 1214   MCV 93 12/24/2018 1214   MCH 29.9 12/24/2018 1214   MCH 32.7 11/24/2014 0029   MCHC 32.0 12/24/2018 1214   MCHC 33.9 11/24/2014 0029   RDW 13.0 12/24/2018 1214   LYMPHSABS 0.6 (L) 10/03/2015 1512   MONOABS 1.2 (H) 11/24/2014 0029   EOSABS 0.2 10/03/2015 1512   BASOSABS 0.0 10/03/2015 1512    Chest Imaging: CT chest 03/03/2017: Evidence of infectious bronchiolitis, 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  Moderate emphysema. The patient's images have been independently reviewed by me.    CT chest September 2020: Reviewed with patient today in the office.  Does have 2 cm right upper lobe nodule with area of inflammatory appearing groundglass around this. Also has evidence of MAI and cylindric bronchiectasis. The patient's images have been independently reviewed by me.    Pulmonary Functions Testing Results: PFT Results Latest Ref Rng & Units 11/23/2018 09/05/2016  FVC-Pre L 1.46 1.71  FVC-Predicted Pre %  59 67  FVC-Post L 1.52 1.75  FVC-Predicted Post % 61 69  Pre FEV1/FVC % % 63 66  Post FEV1/FCV % % 62 69  FEV1-Pre L 0.91 1.13  FEV1-Predicted Pre % 49 59  FEV1-Post L 0.94 1.21  DLCO UNC% % 56 66  DLCO COR %Predicted % 72 86  TLC L 4.17 4.77  TLC % Predicted % 90 103  RV % Predicted % 113 128    FeNO: None   Pathology: None  Echocardiogram: None   Heart Catheterization:  01/05/2019:  Mid LAD lesion is 10% stenosed.  The left ventricular systolic function is normal.  LV end diastolic pressure is normal.  The left ventricular ejection fraction is 55-65% by visual estimate.  There is no aortic valve stenosis.    Assessment & Plan:   No diagnosis found.   Garner Nash, DO [facility] 01/20/2019 11:29 AM

## 2019-01-21 ENCOUNTER — Other Ambulatory Visit: Payer: Self-pay

## 2019-01-21 ENCOUNTER — Ambulatory Visit (INDEPENDENT_AMBULATORY_CARE_PROVIDER_SITE_OTHER): Payer: Medicare Other

## 2019-01-21 ENCOUNTER — Other Ambulatory Visit: Payer: Self-pay | Admitting: *Deleted

## 2019-01-21 DIAGNOSIS — J449 Chronic obstructive pulmonary disease, unspecified: Secondary | ICD-10-CM

## 2019-01-21 NOTE — Progress Notes (Signed)
Pt qualified for 2L O2. Pt was walked after being placed on the 2L to make sure her sats maintained stable. Pt did maintain stable O2 sats on 2L. Order placed for O2 with exertion.

## 2019-01-24 ENCOUNTER — Telehealth: Payer: Self-pay | Admitting: Pulmonary Disease

## 2019-01-24 NOTE — Telephone Encounter (Signed)
Spoke with pt, she would like to know if taking PCN will alter the results of the sputum culture. BI please advise.

## 2019-01-25 NOTE — Telephone Encounter (Signed)
With Dr. Valeta Harms not being in office, Aaron Edelman can you please advise on this for pt.

## 2019-01-25 NOTE — Telephone Encounter (Signed)
Called and spoke with pt letting her know that PCN will not affect the sputum culture and she verbalized understanding. Nothing further needed.

## 2019-01-25 NOTE — Telephone Encounter (Signed)
Complete test as stated. PCN will not affect testing.   Wyn Quaker FNP

## 2019-01-26 ENCOUNTER — Telehealth: Payer: Self-pay | Admitting: Pulmonary Disease

## 2019-01-26 NOTE — Telephone Encounter (Signed)
Called and spoke with pt letting her know that she would have to come by office to pick up sputum cups. Pt verbalized understanding. Cups have been placed up front for pt. Nothing further needed.

## 2019-01-27 ENCOUNTER — Other Ambulatory Visit: Payer: Self-pay

## 2019-01-27 ENCOUNTER — Ambulatory Visit (INDEPENDENT_AMBULATORY_CARE_PROVIDER_SITE_OTHER): Payer: Medicare Other | Admitting: Adult Health

## 2019-01-27 ENCOUNTER — Encounter: Payer: Self-pay | Admitting: Adult Health

## 2019-01-27 DIAGNOSIS — J479 Bronchiectasis, uncomplicated: Secondary | ICD-10-CM | POA: Diagnosis not present

## 2019-01-27 DIAGNOSIS — J9611 Chronic respiratory failure with hypoxia: Secondary | ICD-10-CM

## 2019-01-27 DIAGNOSIS — R918 Other nonspecific abnormal finding of lung field: Secondary | ICD-10-CM

## 2019-01-27 DIAGNOSIS — J449 Chronic obstructive pulmonary disease, unspecified: Secondary | ICD-10-CM

## 2019-01-27 NOTE — Progress Notes (Signed)
Virtual Visit via Telephone Note  I connected with Desiree [name] on 01/27/19 at 10:00 AM EST by telephone and verified that I am speaking with the correct person using two identifiers.  Location: Patient: Home  Provider: Office    I discussed the limitations, risks, security and privacy concerns of performing an evaluation and management service by telephone and the availability of in person appointments. I also discussed with the patient that there may be a patient responsible charge related to this service. The patient expressed understanding and agreed to proceed.   History of Present Illness: 73 year old female former smoker followed for COPD, abnormal CT chest with lung nodules and bronchiectasis first noted 2017 Medical history significant for pulmonary embolism, rheumatoid arthritis on chronic steroids with prednisone 5 mg and Plaquenil  Today's televisit is a 1 week follow-up for moderate to severe COPD, chronic respiratory failure and possible MAI.  Patient was seen last week with increased shortness of breath.  Was noted to have desaturations with ambulation.  She was started on oxygen at 2 L.  Patient says that she feels that this is really improved her breathing.  She is now wearing it with activity.  An overnight oximetry test has been ordered, this is still pending.. Patient also is being evaluated for possible MAI.  Recent CT chest showed tree-in-bud opacities suspicious for possible underlying MAI.  Her right upper lobe nodularity had nearly resolved.  Patient says she has sputum cups at home however has not been able to get adequate specimen and feels that she has messed up the cup and is not able to deliver it per the timeframe.  Discussed that we will mail out some additional cups for her to return. Patient says overall she is doing about the same her breathing is at baseline.  She denies a flare of cough or wheezing.  She is not coughing up anything discolored.  She has no  fever. Appetite is stable.  No unintentional weight loss.  She denies any hemoptysis.   Patient Active Problem List   Diagnosis Date Noted  . Abnormal nuclear stress test   . CKD (chronic kidney disease) stage 3, GFR 30-59 ml/min 01/04/2018  . Senile purpura ([place]) 01/04/2018  . At high risk for falls 07/14/2017  . 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[place]) 07/14/2017  . GERD without esophagitis 07/14/2017  . History of pulmonary embolism 07/14/2017  . Malaise and fatigue 07/14/2017  . Mild episode of recurrent major depressive disorder ([place]) 07/14/2017  . Mixed hyperlipidemia 07/14/2017  . Prediabetes 07/14/2017  . Skin rash 07/14/2017  . Vitamin B12 deficiency 07/14/2017  . 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 Multiple lung nodules on CT 05/16/2016  . Osteoporosis 05/16/2016  . Centrilobular emphysema ([place]) 05/16/2016  . Allergic rhinitis 07/17/2015  . Pulmonary embolism ([place]) 11/23/2014  . Rheumatoid arthritis(714.0) 09/20/2010  . Rheumatoid arthritis ([place]) 09/20/2010  . COPD (chronic obstructive pulmonary disease) ([place]) 08/22/2010  . Hypertension 08/22/2010   Current Outpatient Medications on File Prior to Visit  Medication Sig Dispense Refill  . albuterol (PROAIR HFA) 108 (90 Base) MCG/ACT inhaler Inhale 2 puffs into the lungs every 4 (four) hours as needed for wheezing or shortness of breath.    Marland Kitchen albuterol (PROVENTIL) (2.5 MG/3ML) 0.083% nebulizer solution Take 2.5 mg by nebulization every 6 (six) hours as needed for wheezing or shortness of breath.    Marland Kitchen alendronate (FOSAMAX) 70 MG tablet Take 70 mg by mouth once a week. Saturday    .  amLODipine (NORVASC) 10 MG tablet Take 1 tablet (10 mg total) by mouth daily. 30 tablet 3  . apixaban (ELIQUIS) 5 MG TABS tablet Take 1 tablet (5 mg total) by mouth 2 (two) times daily. 60 tablet 5  . atorvastatin (LIPITOR) 20 MG tablet Take 1 tablet (20 mg total) by mouth daily. 90 tablet 1  . budesonide-formoterol  (SYMBICORT) 160-4.5 MCG/ACT inhaler Inhale 2 puffs into the lungs every 12 (twelve) hours. 1 Inhaler 11  . carvedilol (COREG) 3.125 MG tablet Take 1 tablet (3.125 mg total) by mouth 2 (two) times daily with a meal. 180 tablet 1  . cholecalciferol (VITAMIN D) 25 MCG (1000 UT) tablet Take 1,000 Units by mouth daily.     . fexofenadine (ALLEGRA) 180 MG tablet Take 180 mg by mouth daily.    . hydroxychloroquine (PLAQUENIL) 200 MG tablet Take 200 mg by mouth 2 (two) times daily.   3  . montelukast (SINGULAIR) 10 MG tablet Take 1 tablet (10 mg total) by mouth at bedtime. (Patient taking differently: Take 10 mg by mouth daily. ) 90 tablet 0  . omeprazole (PRILOSEC) 20 MG capsule Take 1 capsule (20 mg total) by mouth daily. 90 capsule 1  . predniSONE (DELTASONE) 5 MG tablet Take 5 mg by mouth daily with breakfast.    . Quercetin 250 MG TABS Take 500 mg by mouth daily.    . sertraline (ZOLOFT) 100 MG tablet Take 100 mg by mouth daily.     . Tiotropium Bromide Monohydrate (SPIRIVA RESPIMAT) 2.5 MCG/ACT AERS Inhale 2 puffs into the lungs daily. 4 g 11  . valsartan (DIOVAN) 320 MG tablet Take 320 mg by mouth daily.    . vitamin B-12 (CYANOCOBALAMIN) 1000 MCG tablet Take 1,000 mcg by mouth daily.      No current facility-administered medications on file prior to visit.      Observations/Objective:  PET scan July 2019 showed FDG activity SUV 1.348 mm left lower lobe nodule.    IgE 158, Rast panel positive for cat dander, dog dander and Aspergillus fumigate [place].  Respiratory cultures isolated yeast and Neisseria meningitidis  PFTs revealed a ratio of 62, FEV1 51%   In December 2020 CT scan of the chest: Right lung nodule resolution, tree-in-bud opacities and evidence of what would be considered chronic infection such as MAI.   Assessment and Plan: COPD - appears stable , continue current regimen   Abnormal CT chest with lung nodularity -recent CT chest showed improvement with right upper lobe lung  nodule resolution however continues to have tree-in-bud opacities concerning for possible MAI.  Will resend out sputum culture cups for fungal and AFB  Chronic respiratory failure now with exertional hypoxemia-improved on oxygen therapy.  Overnight oximetry test is pending Continue on O2 at 2 L with activity  Plan  Patient Instructions  Continue on Symbicort 2 puffs twice daily, rinse after use Continue on Spiriva daily Continue on oxygen 2 L with activity. Overnight oximetry test is pending we will follow up on results when available We will mail out additional sputum culture cups for you to return Follow-up in 4-6 weeks with Icard and As needed   Please contact office for sooner follow up if symptoms do not improve or worsen or seek emergency care        Follow Up Instructions: Follow-up in 4 to 6 weeks and as needed    I discussed the assessment and treatment plan with the patient. The patient was provided an opportunity to  ask questions and all were answered. The patient agreed with the plan and demonstrated an understanding of the instructions.   The patient was advised to call back or seek an in-person evaluation if the symptoms worsen or if the condition fails to improve as anticipated.  I provided 30  minutes of non-face-to-face time during this encounter.   Rexene Edison, NP

## 2019-01-27 NOTE — Patient Instructions (Addendum)
Continue on Symbicort 2 puffs twice daily, rinse after use Continue on Spiriva daily Continue on oxygen 2 L with activity. Overnight oximetry test is pending we will follow up on results when available We will mail out additional sputum culture cups for you to return Follow-up in 4-6 weeks with Icard and As needed   Please contact office for sooner follow up if symptoms do not improve or worsen or seek emergency care

## 2019-02-01 ENCOUNTER — Other Ambulatory Visit: Payer: Self-pay | Admitting: Emergency Medicine

## 2019-02-01 MED ORDER — MONTELUKAST SODIUM 10 MG PO TABS: 10.0000 mg | ORAL_TABLET | Freq: Every day | ORAL | 1 refills | Status: DC

## 2019-02-02 ENCOUNTER — Ambulatory Visit (INDEPENDENT_AMBULATORY_CARE_PROVIDER_SITE_OTHER): Payer: Medicare Other | Admitting: Cardiology

## 2019-02-02 ENCOUNTER — Other Ambulatory Visit: Payer: Self-pay

## 2019-02-02 ENCOUNTER — Encounter: Payer: Self-pay | Admitting: Cardiology

## 2019-02-02 VITALS — BP 130/60 | HR 92 | Ht 61.0 in | Wt 143.0 lb

## 2019-02-02 DIAGNOSIS — I480 Paroxysmal atrial fibrillation: Secondary | ICD-10-CM

## 2019-02-02 DIAGNOSIS — I2510 Atherosclerotic heart disease of native coronary artery without angina pectoris: Secondary | ICD-10-CM

## 2019-02-02 DIAGNOSIS — R7303 Prediabetes: Secondary | ICD-10-CM

## 2019-02-02 DIAGNOSIS — I10 Essential (primary) hypertension: Secondary | ICD-10-CM

## 2019-02-02 HISTORY — DX: Atherosclerotic heart disease of native coronary artery without angina pectoris: I25.10

## 2019-02-02 NOTE — Progress Notes (Signed)
Cardiology Office Note:    Date:  02/02/2019   ID:  Desiree [name], [place] Jan 16, 1946, MRN [id number]  PCP:  Desiree Mina., MD  Cardiologist:  Desiree Salines, DO  Electrophysiologist:  None   Referring MD: Desiree Mina., MD   Follow-up for hypertension and atrial fibrillation.  History of Present Illness:    Desiree [name] is a 73 y.o. female with a hx of hypertension, newly depressed ejection fraction, COPD and recently diagnosed atrial fibrillation she is currently on Eliquis 5 mg twice daily.  The patient last visit discussed abnormal stress test and plan for cardiac catheterization.  She was able to undergo cardiac catheterization which reported 10% lesion in LAD with no indication for PCI.  She is here for follow-up visit.  Since I last seen her she also has seen pulmonary and was placed on oxygen.  Past Medical History:  Diagnosis Date  . Cat allergies   . COPD (chronic obstructive pulmonary disease) ([place])   . Hypertension   . Osteoporosis   . Pulmonary embolism ([place]) 2017  . Rheumatoid arthritis(714.0)    Dr Tobie Lords follows    Past Surgical History:  Procedure Laterality Date  . CYSTECTOMY Right    breast  . LEFT HEART CATH AND CORONARY ANGIOGRAPHY N/A 01/05/2019   Procedure: LEFT HEART CATH AND CORONARY ANGIOGRAPHY;  Surgeon: Jettie Booze, MD;  Location: [facility];  Service: Cardiovascular;  Laterality: N/A;    Current Medications: Current Meds  Medication Sig  . albuterol (PROAIR HFA) 108 (90 Base) MCG/ACT inhaler Inhale 2 puffs into the lungs every 4 (four) hours as needed for wheezing or shortness of breath.  Marland Kitchen albuterol (PROVENTIL) (2.5 MG/3ML) 0.083% nebulizer solution Take 2.5 mg by nebulization every 6 (six) hours as needed for wheezing or shortness of breath.  Marland Kitchen alendronate (FOSAMAX) 70 MG tablet Take 70 mg by mouth once a week. Saturday  . amLODipine (NORVASC) 10 MG tablet Take 1 tablet (10 mg total) by mouth daily.  Marland Kitchen  apixaban (ELIQUIS) 5 MG TABS tablet Take 1 tablet (5 mg total) by mouth 2 (two) times daily.  Marland Kitchen atorvastatin (LIPITOR) 20 MG tablet Take 1 tablet (20 mg total) by mouth daily.  . budesonide-formoterol (SYMBICORT) 160-4.5 MCG/ACT inhaler Inhale 2 puffs into the lungs every 12 (twelve) hours.  . carvedilol (COREG) 3.125 MG tablet Take 1 tablet (3.125 mg total) by mouth 2 (two) times daily with a meal.  . cholecalciferol (VITAMIN D) 25 MCG (1000 UT) tablet Take 1,000 Units by mouth daily.   . fexofenadine (ALLEGRA) 180 MG tablet Take 180 mg by mouth daily.  . hydroxychloroquine (PLAQUENIL) 200 MG tablet Take 200 mg by mouth 2 (two) times daily.   . montelukast (SINGULAIR) 10 MG tablet Take 1 tablet (10 mg total) by mouth at bedtime.  Marland Kitchen omeprazole (PRILOSEC) 20 MG capsule Take 1 capsule (20 mg total) by mouth daily.  . predniSONE (DELTASONE) 5 MG tablet Take 5 mg by mouth daily with breakfast.  . Quercetin 250 MG TABS Take 500 mg by mouth daily.  . sertraline (ZOLOFT) 100 MG tablet Take 100 mg by mouth daily.   . Tiotropium Bromide Monohydrate (SPIRIVA RESPIMAT) 2.5 MCG/ACT AERS Inhale 2 puffs into the lungs daily.  . valsartan (DIOVAN) 320 MG tablet Take 320 mg by mouth daily.  . vitamin B-12 (CYANOCOBALAMIN) 1000 MCG tablet Take 1,000 mcg by mouth daily.      Allergies:   Other   Social History  Socioeconomic History  . Marital status: Widowed    Spouse name: Not on file  . Number of children: Not on file  . Years of education: Not on file  . Highest education level: Not on file  Occupational History  . Occupation: retired  Tobacco Use  . Smoking status: Former Smoker    Packs/day: 1.00    Years: 40.00    Pack years: 40.00    Types: Cigarettes    Quit date: 02/03/2001    Years since quitting: 18.0  . Smokeless tobacco: Never Used  Substance and Sexual Activity  . Alcohol use: Yes    Alcohol/week: 2.0 standard drinks    Types: 2 Standard drinks or equivalent per week    Comment:  2 drinks daily  . Drug use: No  . Sexual activity: Not on file  Other Topics Concern  . Not on file  Social History Narrative   Tobacco use, amount per day now: NONE   Past tobacco use, amount per day: 1 PACK PER DAY   How many years did you use tobacco:TOO MANY STOPPED IN 2003   Alcohol use (drinks per week): 14   Diet: GOOD   Do you drink/eat things with caffeine: YES   Marital status: WIDOW                What year were you married? 1967   Do you live in a house, apartment, assisted living, condo, trailer, etc.? HOUSE   Is it one or more stories? SPLIT LEVEL   How many persons live in your home? 1   Do you have pets in your home?( please list) NONE   Current or past profession: [employer]   Do you exercise?  NO                                Type and how often?   Do you have a living will? YES    Do you have a DNR form?  YES                                 If not, do you want to discuss one?   Do you have signed POA/HPOA forms?        ??              If so, please bring to you appointment      [facility] (05/16/16):   Originally from [place], [place]. 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 No pets currently. No bird, mold, or hot tub exposure.    Social Determinants of Health   Financial Resource Strain:   . Difficulty of Paying Living Expenses: Not on file  Food Insecurity:   . Worried About Charity fundraiser in the Last Year: Not on file  . Ran Out of Food in the Last Year: Not on file  Transportation Needs:   . Lack of Transportation (Medical): Not on file  . Lack of Transportation (Non-Medical): Not on file  Physical Activity:   . Days of Exercise per Week: Not on file  . Minutes of Exercise per Session: Not on file  Stress:   . Feeling of Stress : Not on file  Social Connections:   . Frequency of Communication with Friends and Family: Not on file  .  Frequency of Social Gatherings with  Friends and Family: Not on file  . Attends Religious Services: Not on file  . Active Member of Clubs or Organizations: Not on file  . Attends Archivist Meetings: Not on file  . Marital Status: Not on file     Family History: The patient's family history includes Breast cancer in her maternal aunt; Clotting disorder in her mother; Emphysema in her paternal grandmother and sister; Heart disease in her father and sister; Sleep apnea in her sister; Stroke in her mother.  ROS:   Review of Systems  Constitution: Negative for decreased appetite, fever and weight gain.  HENT: Negative for congestion, ear discharge, hoarse voice and sore throat.   Eyes: Negative for discharge, redness, vision loss in right eye and visual halos.  Cardiovascular: Negative for chest pain, dyspnea on exertion, leg swelling, orthopnea and palpitations.  Respiratory: Negative for cough, hemoptysis, shortness of breath and snoring.   Endocrine: Negative for heat intolerance and polyphagia.  Hematologic/Lymphatic: Negative for bleeding problem. Does not bruise/bleed easily.  Skin: Negative for flushing, nail changes, rash and suspicious lesions.  Musculoskeletal: Negative for arthritis, joint pain, muscle cramps, myalgias, neck pain and stiffness.  Gastrointestinal: Negative for abdominal pain, bowel incontinence, diarrhea and excessive appetite.  Genitourinary: Negative for decreased libido, genital sores and incomplete emptying.  Neurological: Negative for brief paralysis, focal weakness, headaches and loss of balance.  Psychiatric/Behavioral: Negative for altered mental status, depression and suicidal ideas.  Allergic/Immunologic: Negative for HIV exposure and persistent infections.    EKGs/Labs/Other Studies Reviewed:    The following studies were reviewed today:   EKG:  The ekg ordered today demonstrates sinus rhythm, heart rate 80 bpm.  Compared to prior EKG on December 24, 2018 patient is no longer  in atrial fibrillation.  Recent Labs: 12/24/2018: BUN 18; Creatinine, Ser 0.87; Hemoglobin 10.8; Platelets 356; Potassium 5.1; Sodium 139  Recent Lipid Panel No results found for: CHOL, TRIG, HDL, CHOLHDL, VLDL, LDLCALC, LDLDIRECT  Physical Exam:    VS:  BP 130/60   Pulse 92   Ht 5\' 1"  (1.549 m)   Wt 143 lb (64.9 kg)   SpO2 94%   BMI 27.02 kg/m     Wt Readings from Last 3 Encounters:  02/02/19 143 lb (64.9 kg)  01/05/19 144 lb (65.3 kg)  12/24/18 147 lb (66.7 kg)     GEN: Well nourished, well developed in no acute distress HEENT: Normal NECK: No JVD; No carotid bruits LYMPHATICS: No lymphadenopathy CARDIAC: S1S2 noted,RRR, no murmurs, rubs, gallops RESPIRATORY:  Clear to auscultation without rales, wheezing or rhonchi  ABDOMEN: Soft, non-tender, non-distended, +bowel sounds, no guarding. EXTREMITIES: No edema, No cyanosis, no clubbing MUSCULOSKELETAL:  No edema; No deformity  SKIN: Warm and dry NEUROLOGIC:  Alert and oriented x 3, non-focal PSYCHIATRIC:  Normal affect, good insight  ASSESSMENT:    1. Paroxysmal atrial fibrillation (HCC)   2. Essential hypertension   3. Prediabetes   4. Mild CAD    PLAN:     1.  She seems to be doing well from a cardiovascular standpoint.  We discussed her diagnosis of atrial fibrillation as well as her cardiac catheterization.  Now we will continue with rate control strategy and anticoagulate the patient with Eliquis 5 mg twice daily.  She feels better since starting oxygen although not optimal best.  2.  Her blood pressure is appropriate in the office today we will going to continue her on her current regimen.  3.  And she will continue her atorvastatin 20 milligrams daily  The patient is in agreement with the above plan. The patient left the office in stable condition.  The patient will follow up in 6 months or sooner if needed   Medication Adjustments/Labs and Tests Ordered: Current medicines are reviewed at length with the  patient today.  Concerns regarding medicines are outlined above.  Orders Placed This Encounter  Procedures  . EKG 12-Lead   No orders of the defined types were placed in this encounter.   Patient Instructions  Medication Instructions:  Your physician recommends that you continue on your current medications as directed. Please refer to the Current Medication list given to you today.  *If you need a refill on your cardiac medications before your next appointment, please call your pharmacy*  Lab Work: NOne If you have labs (blood work) drawn today and your tests are completely normal, you will receive your results only by: Marland Kitchen MyChart Message (if you have MyChart) OR . A paper copy in the mail If you have any lab test that is abnormal or we need to change your treatment, we will call you to review the results.  Testing/Procedures: None  Follow-Up: At [facility], you and your health needs are our priority.  As part of our continuing mission to provide you with exceptional heart care, we have created designated Provider Care Teams.  These Care Teams include your primary Cardiologist (physician) and Advanced Practice Providers (APPs -  Physician Assistants and Nurse Practitioners) who all work together to provide you with the care you need, when you need it.  Your next appointment:   6 month(s)  The format for your next appointment:   In Person  Provider:   Berniece Salines, DO  Other Instructions      Adopting a Healthy Lifestyle.  Know what a healthy weight is for you (roughly BMI <25) and aim to maintain this   Aim for 7+ servings of fruits and vegetables daily   65-80+ fluid ounces of water or unsweet tea for healthy kidneys   Limit to max 1 drink of alcohol per day; avoid smoking/tobacco   Limit animal fats in diet for cholesterol and heart health - choose grass fed whenever available   Avoid highly processed foods, and foods high in saturated/trans fats   Aim for  low stress - take time to unwind and care for your mental health   Aim for 150 min of moderate intensity exercise weekly for heart health, and weights twice weekly for bone health   Aim for 7-9 hours of sleep daily   When it comes to diets, agreement about the perfect plan isnt easy to find, even among the experts. Experts at the [employer] developed an idea known as the Healthy Eating Plate. Just imagine a plate divided into logical, healthy portions.   The emphasis is on diet quality:   Load up on vegetables and fruits - one-half of your plate: Aim for color and variety, and remember that potatoes dont count.   Go for whole grains - one-quarter of your plate: Whole wheat, barley, wheat berries, quinoa, oats, brown rice, and foods made with them. If you want pasta, go with whole wheat pasta.   Protein power - one-quarter of your plate: Fish, chicken, beans, and nuts are all healthy, versatile protein sources. Limit red meat.   The diet, however, does go beyond the plate, offering a few other suggestions.   Use healthy  plant oils, such as olive, canola, soy, corn, sunflower and peanut. Check the labels, and avoid partially hydrogenated oil, which have unhealthy trans fats.   If youre thirsty, drink water. Coffee and tea are good in moderation, but skip sugary drinks and limit milk and dairy products to one or two daily servings.   The type of carbohydrate in the diet is more important than the amount. Some sources of carbohydrates, such as vegetables, fruits, whole grains, and beans-are healthier than others.   Finally, stay active  Signed, Desiree Salines, DO  02/02/2019 9:52 PM    [facility]

## 2019-02-02 NOTE — Patient Instructions (Signed)
Medication Instructions:  Your physician recommends that you continue on your current medications as directed. Please refer to the Current Medication list given to you today.  *If you need a refill on your cardiac medications before your next appointment, please call your pharmacy*  Lab Work: NOne If you have labs (blood work) drawn today and your tests are completely normal, you will receive your results only by: Marland Kitchen MyChart Message (if you have MyChart) OR . A paper copy in the mail If you have any lab test that is abnormal or we need to change your treatment, we will call you to review the results.  Testing/Procedures: None  Follow-Up: At [facility], you and your health needs are our priority.  As part of our continuing mission to provide you with exceptional heart care, we have created designated Provider Care Teams.  These Care Teams include your primary Cardiologist (physician) and Advanced Practice Providers (APPs -  Physician Assistants and Nurse Practitioners) who all work together to provide you with the care you need, when you need it.  Your next appointment:   6 month(s)  The format for your next appointment:   In Person  Provider:   Berniece Salines, DO  Other Instructions

## 2019-02-03 ENCOUNTER — Other Ambulatory Visit: Payer: Self-pay | Admitting: Pulmonary Disease

## 2019-02-03 DIAGNOSIS — J449 Chronic obstructive pulmonary disease, unspecified: Secondary | ICD-10-CM

## 2019-02-03 NOTE — Progress Notes (Signed)
PCCM: Thanks for seeing her.  Garner Nash, DO [facility] 02/03/2019 7:27 AM

## 2019-02-07 ENCOUNTER — Telehealth: Payer: Self-pay | Admitting: Pulmonary Disease

## 2019-02-07 NOTE — Telephone Encounter (Signed)
Called the patient and made her aware of the response. Patient stated her neighbor was not at her door when she opened it, but was down the hallway by the elevator.   I advised her to call our office if she develops any symptoms. The patient stated she needed to drop off her sputum sample, she does not have any family or a friend that can do it for her. I advised that she make sure to have a mask on and sanitize before giving the bag the sample is in to the front desk. Patient voiced understanding. Nothing further needed at this time.

## 2019-02-07 NOTE — Telephone Encounter (Signed)
We can set her up for a COVID 19 test if she would like however with no symptoms probably not necessary   Good handwashing . Social distancing Desiree [name] (3 Ws )

## 2019-02-07 NOTE — Telephone Encounter (Signed)
Spoke with the pt  She states lives in a senior apartment complex  The person in the apartment across from her just found out that he has covid yesterday  She states that the only contact that she has with him is when he gets done reading the newspaper he delivers it to her on her doorstep so she can read it  She states that she tries her best to remember to wash her hands  She wonders if she should be concerned  She denies any symptoms  Please advise thanks

## 2019-03-02 ENCOUNTER — Telehealth: Payer: Self-pay | Admitting: Pulmonary Disease

## 2019-03-02 NOTE — Telephone Encounter (Addendum)
ONO performed 02/18/19 with pt wearing 2L O2.  Per Dr. Valeta Harms:  Patient's O2 sats were only below 89% for 14sec. Due to this, pt is okay to continue wearing her 2L O2 at night.   Pt originally had an ONO performed on room air 01/31/19 and this one showed that pt's O2 was below 88% for more than 6 hours. Due to this, pt will need to wear her O2 at bedtime to keep sats stable.   Called and spoke with pt letting her know the results of the ONO and stated to her to continue wearing 2L O2 at bedtime. Pt verbalized understanding. Nothing further needed.

## 2019-03-10 ENCOUNTER — Encounter: Payer: Self-pay | Admitting: Pulmonary Disease

## 2019-03-10 ENCOUNTER — Telehealth (INDEPENDENT_AMBULATORY_CARE_PROVIDER_SITE_OTHER): Payer: Medicare PPO | Admitting: Pulmonary Disease

## 2019-03-10 DIAGNOSIS — R918 Other nonspecific abnormal finding of lung field: Secondary | ICD-10-CM

## 2019-03-10 DIAGNOSIS — J479 Bronchiectasis, uncomplicated: Secondary | ICD-10-CM

## 2019-03-10 DIAGNOSIS — J449 Chronic obstructive pulmonary disease, unspecified: Secondary | ICD-10-CM | POA: Diagnosis not present

## 2019-03-10 DIAGNOSIS — J9611 Chronic respiratory failure with hypoxia: Secondary | ICD-10-CM | POA: Diagnosis not present

## 2019-03-10 NOTE — Patient Instructions (Signed)
Thank you for visiting Dr. Valeta Harms at [facility]. Today we recommend the following:  Return in about 6 weeks (around 04/21/2019) for with TP or Dr. Valeta Harms.    Please do your part to reduce the spread of COVID-19.

## 2019-03-10 NOTE — Progress Notes (Signed)
Virtual Visit via Video note  I connected with Desiree [name] on 03/10/19 at  1:30 PM EST by telephone and verified that I am speaking with the correct person using two identifiers.  Location: Patient: Desiree [name]  Provider: Garner Nash, DO    I discussed the limitations, risks, security and privacy concerns of performing an evaluation and management service by telephone and the availability of in person appointments. I also discussed with the patient that there may be a patient responsible charge related to this service. The patient expressed understanding and agreed to proceed.  History of Present Illness: This is a 74 year old female past medical history of COPD, followed for abnormal chest imaging prior lung nodule resolved also has evidence of bronchiectasis.  Patient also last seen in the office via telemetry visit.  Reviewed recent documentation from rheumatologist.  History of pulmonary embolism, history of rheumatoid arthritis on chronic steroids 5 mg plus Plaquenil.  Patient has moderate to severe obstructive lung disease with chronic hypoxemic respiratory failure.  Oxygen requirement is now new for her.  She has evidence of a possible MAI.  She recently dropped off sputum for culture, fungus and AFB but there was no running of this studies even though testing was ordered.  We will need to follow-up on this from an office standpoint.  Recent CT imaging showed resolution of the nodule that we were following from before.  She has lots of questions today regarding her dyspnea.  She feels like he has not much better even with the initiation of oxygen.  I explained how her O2 sats have been stable.  And this is important for her.  She also needs to wear oxygen at nighttime.   Observations/Objective: Able to talk in complete sentences.  Somewhat labored breathing.  Appears comfortable in her recliner via video visit  Assessment and Plan: COPD Abnormal CT imaging, tree-in-bud  nodularity possible MAI Chronic hypoxemic respiratory failure on home oxygen therapy Exertional dyspnea  Plan: Continue Symbicort plus Spiriva Reorder cultures We can have the cultures completed the next time she follows up in clinic. Continue oxygen therapy during the day and at nighttime. Reviewed results of ONO with patient today in the office  Follow Up Instructions:  Patient to follow-up with Patricia Nettle, NP or myself in 6 to 8 weeks.   I discussed the assessment and treatment plan with the patient. The patient was provided an opportunity to ask questions and all were answered. The patient agreed with the plan and demonstrated an understanding of the instructions.   The patient was advised to call back or seek an in-person evaluation if the symptoms worsen or if the condition fails to improve as anticipated.  I provided 23 minutes of non-face-to-face time during this encounter.  Garner Nash, DO

## 2019-03-29 ENCOUNTER — Other Ambulatory Visit: Payer: Self-pay | Admitting: *Deleted

## 2019-03-29 MED ORDER — AMLODIPINE BESYLATE 10 MG PO TABS: 10.0000 mg | ORAL_TABLET | Freq: Every day | ORAL | 1 refills | Status: DC

## 2019-04-06 NOTE — Telephone Encounter (Signed)
Dr. Valeta Harms, please see pt's mychart message and advise on it for her.

## 2019-04-14 DIAGNOSIS — I480 Paroxysmal atrial fibrillation: Secondary | ICD-10-CM

## 2019-04-14 DIAGNOSIS — R6881 Early satiety: Secondary | ICD-10-CM

## 2019-04-14 HISTORY — DX: Early satiety: R68.81

## 2019-04-14 HISTORY — DX: Paroxysmal atrial fibrillation: I48.0

## 2019-04-20 ENCOUNTER — Telehealth: Payer: Self-pay | Admitting: Cardiology

## 2019-04-20 NOTE — Telephone Encounter (Signed)
Let the patient know the Dr. Harriet Masson was aware that her PCP discontinued her blood thinner

## 2019-04-20 NOTE — Telephone Encounter (Signed)
That will be fine for now.

## 2019-04-20 NOTE — Telephone Encounter (Signed)
Patient called to report that she anemia, she had to have a blood transfusion, and needs to have a colonoscopy, and PCP has taken her off her blood thinners. She just wants to make sure this is okay with Dr. Harriet Masson.

## 2019-04-21 NOTE — Telephone Encounter (Signed)
Please have her schedule to see me after her endoscopy and visit with GI. I will want GI input as well. If she had not been referred to GI by her pcp - please send a referral.

## 2019-04-25 NOTE — Telephone Encounter (Signed)
Called the patient to let them know Dr. Harriet Masson would like to see her after her endoscopy and have input from patients GI doctor as well. Pt states she is currently seeing a GI MD and her endoscopy is scheduled for Aug 31st so she will reach out to the office to make an appointment once she has had that done. Will forward to Dr. Harriet Masson as Desiree [name].

## 2019-04-29 ENCOUNTER — Telehealth: Payer: Self-pay | Admitting: Pulmonary Disease

## 2019-04-29 NOTE — Telephone Encounter (Signed)
Patient did not pick up sputum cups from 01/2019 visit. Patient did not complete her video visit with Dr. Valeta Harms on 03/10/19. OV states wanted to know results of sputum. But I cannot see where any were resulted.  Dr. Valeta Harms please advise does patient need to come pick up sputum cups or was there a sputum already collected

## 2019-05-09 NOTE — Telephone Encounter (Signed)
I see the orders but no results. I am not sure Thanks Garner Nash, DO [facility] 05/09/2019 4:41 PM

## 2019-05-13 NOTE — Telephone Encounter (Signed)
ATC patient unable to reach, Patient will need to come by office and pick up sputums cups.   Will route to [facility] triage to follow up on

## 2019-05-16 NOTE — Telephone Encounter (Signed)
Spoke with the pt and she states that she did come to the office and leave sputum samples.  She states that they were dropped off at the front door and she can not recall when b/c this was wks ago. The sputum was never sent out b/c there are no results.  Do you wish to have her come back and repeat this again? Or discuss further on f/u at televisit 4/22?  Please advise, thanks

## 2019-05-17 NOTE — Telephone Encounter (Signed)
We will discuss at next visit  Thanks Garner Nash, [facility] 05/17/2019 4:17 PM

## 2019-05-17 NOTE — Telephone Encounter (Signed)
Called pt and advised message from the provider. Pt understood and verbalized understanding. Nothing further is needed.    

## 2019-05-20 ENCOUNTER — Telehealth: Payer: Self-pay | Admitting: Cardiology

## 2019-05-20 NOTE — Telephone Encounter (Signed)
I spoke with patient. She has had endoscopy and colonoscopy and is calling to follow up on blood thinner plans.  Per last phone note patient to have office visit after GI procedures.  I scheduled patient to see Dr Harriet Masson on April 21,2021 at 1:15

## 2019-05-20 NOTE — Telephone Encounter (Signed)
Patient was told to let Dr. Harriet Masson know when she was done with all of her procedures so that she could discuss her INR Therapy. She has had all of her procedures and is ready to precede as discussed

## 2019-05-25 ENCOUNTER — Other Ambulatory Visit: Payer: Self-pay

## 2019-05-25 ENCOUNTER — Ambulatory Visit: Payer: Medicare PPO | Admitting: Cardiology

## 2019-05-25 ENCOUNTER — Encounter: Payer: Self-pay | Admitting: Cardiology

## 2019-05-25 VITALS — BP 120/66 | HR 80 | Ht 61.0 in | Wt 135.0 lb

## 2019-05-25 DIAGNOSIS — I10 Essential (primary) hypertension: Secondary | ICD-10-CM | POA: Diagnosis not present

## 2019-05-25 DIAGNOSIS — I480 Paroxysmal atrial fibrillation: Secondary | ICD-10-CM

## 2019-05-25 DIAGNOSIS — I2510 Atherosclerotic heart disease of native coronary artery without angina pectoris: Secondary | ICD-10-CM | POA: Diagnosis not present

## 2019-05-25 DIAGNOSIS — E782 Mixed hyperlipidemia: Secondary | ICD-10-CM | POA: Diagnosis not present

## 2019-05-25 NOTE — Progress Notes (Signed)
Cardiology Office Note:    Date:  05/25/2019   ID:  [name], [place] 1945/08/11, MRN [id number]  PCP:  Raina Mina., MD  Cardiologist:  Berniece Salines, DO  Electrophysiologist:  None   Referring MD: Raina Mina., MD   Chief Complaint  Patient presents with  . Follow-up    History of Present Illness:    Desiree [name] is a 74 y.o. female with a hx of paroxysmal atrial fibrillation was taken off anticoagulation due to significant GI bleed edema, mild coronary artery disease, hypertension, history of pulmonary embolism presents today for follow-up visit.  I did see the patient in December 2020 at that time she appears to be doing well from a cardiovascular standpoint.  She was on Eliquis 5 mg twice a day for atrial fibrillation.  She also has been started on oxygen for her COPD and she tells me that it made a difference.  In the interim the patient did see her PCP, and reported significant fatigue and weakness and found to have anemia.  Have a hemoglobin of 6.4 on April 15, 2019, she received 2 units of packed red blood cells and improved to 10.2.  The patient had a endoscopy on May 04, 2019 which showed a hiatal hernia but otherwise normal upper endoscopy.  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  There was mild internal and small external hemorrhoids.  At this time continue to hold her Eliquis per GI, she reports that her generalized weakness is improving but not yet resolved.  Denies any chest pain, shortness of breath  Past Medical History:  Diagnosis Date  . Cat allergies   . COPD (chronic obstructive pulmonary disease) ([place])   . Hypertension   . Osteoporosis   . Pulmonary embolism ([place]) 2017  . Rheumatoid arthritis(714.0)    Dr Tobie Lords follows    Past Surgical History:  Procedure Laterality Date  . CYSTECTOMY Right    breast  . LEFT HEART CATH AND CORONARY  ANGIOGRAPHY N/A 01/05/2019   Procedure: LEFT HEART CATH AND CORONARY ANGIOGRAPHY;  Surgeon: Jettie Booze, MD;  Location: [place] [facility];  Service: Cardiovascular;  Laterality: N/A;    Current Medications: Current Meds  Medication Sig  . albuterol (PROAIR HFA) 108 (90 Base) MCG/ACT inhaler Inhale 2 puffs into the lungs every 4 (four) hours as needed for wheezing or shortness of breath.  Marland Kitchen albuterol (PROVENTIL) (2.5 MG/3ML) 0.083% nebulizer solution Take 2.5 mg by nebulization every 6 (six) hours as needed for wheezing or shortness of breath.  Marland Kitchen amLODipine (NORVASC) 10 MG tablet Take 1 tablet (10 mg total) by mouth daily.  Marland Kitchen aspirin EC 81 MG tablet Take 81 mg by mouth daily.  Marland Kitchen atorvastatin (LIPITOR) 20 MG tablet Take 1 tablet (20 mg total) by mouth daily.  . budesonide-formoterol (SYMBICORT) 160-4.5 MCG/ACT inhaler Inhale 2 puffs into the lungs every 12 (twelve) hours.  . carvedilol (COREG) 3.125 MG tablet Take 1 tablet (3.125 mg total) by mouth 2 (two) times daily with a meal.  . cholecalciferol (VITAMIN D) 25 MCG (1000 UT) tablet Take 1,000 Units by mouth daily.   . ferrous sulfate 325 (65 FE) MG EC tablet Take 325 mg by mouth in the morning and at bedtime.  . fexofenadine (ALLEGRA) 180 MG tablet Take 180 mg by mouth daily.  . hydroxychloroquine (PLAQUENIL) 200 MG tablet Take 200 mg by  mouth 2 (two) times daily.   Marland Kitchen leflunomide (ARAVA) 20 MG tablet Take 20 mg by mouth daily.  . montelukast (SINGULAIR) 10 MG tablet Take 1 tablet (10 mg total) by mouth at bedtime.  Marland Kitchen omeprazole (PRILOSEC) 20 MG capsule Take 1 capsule (20 mg total) by mouth daily.  . predniSONE (DELTASONE) 5 MG tablet Take 5 mg by mouth daily with breakfast.  . sertraline (ZOLOFT) 100 MG tablet Take 100 mg by mouth daily.   . Tiotropium Bromide Monohydrate (SPIRIVA RESPIMAT) 2.5 MCG/ACT AERS Inhale 2 puffs into the lungs daily.  . valsartan (DIOVAN) 320 MG tablet Take 320 mg by mouth daily.  . vitamin B-12  (CYANOCOBALAMIN) 1000 MCG tablet Take 1,000 mcg by mouth daily.      Allergies:   Other   Social History   Socioeconomic History  . Marital status: Widowed    Spouse name: Not on file  . Number of children: Not on file  . Years of education: Not on file  . Highest education level: Not on file  Occupational History  . Occupation: retired  Tobacco Use  . Smoking status: Former Smoker    Packs/day: 1.00    Years: 40.00    Pack years: 40.00    Types: Cigarettes    Quit date: 02/03/2001    Years since quitting: 18.3  . Smokeless tobacco: Never Used  Substance and Sexual Activity  . Alcohol use: Yes    Alcohol/week: 2.0 standard drinks    Types: 2 Standard drinks or equivalent per week    Comment: 2 drinks daily  . Drug use: No  . Sexual activity: Not on file  Other Topics Concern  . Not on file  Social History Narrative   Tobacco use, amount per day now: NONE   Past tobacco use, amount per day: 1 PACK PER DAY   How many years did you use tobacco:TOO MANY STOPPED IN 2003   Alcohol use (drinks per week): 14   Diet: GOOD   Do you drink/eat things with caffeine: YES   Marital status: WIDOW                What year were you married? 1967   Do you live in a house, apartment, assisted living, condo, trailer, etc.? HOUSE   Is it one or more stories? SPLIT LEVEL   How many persons live in your home? 1   Do you have pets in your home?( please list) NONE   Current or past profession: [employer]   Do you exercise?  NO                                Type and how often?   Do you have a living will? YES    Do you have a DNR form?  YES                                 If not, do you want to discuss one?   Do you have signed POA/HPOA forms?        ??              If so, please bring to you appointment      [facility] (05/16/16):   Originally from [place], [place]. 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  No pets  currently. No bird, mold, or hot tub exposure.    Social Determinants of Health   Financial Resource Strain:   . Difficulty of Paying Living Expenses:   Food Insecurity:   . Worried About Charity fundraiser in the Last Year:   . Arboriculturist in the Last Year:   Transportation Needs:   . Film/video editor (Medical):   Marland Kitchen Lack of Transportation (Non-Medical):   Physical Activity:   . Days of Exercise per Week:   . Minutes of Exercise per Session:   Stress:   . Feeling of Stress :   Social Connections:   . Frequency of Communication with Friends and Family:   . Frequency of Social Gatherings with Friends and Family:   . Attends Religious Services:   . Active Member of Clubs or Organizations:   . Attends Archivist Meetings:   Marland Kitchen Marital Status:      Family History: The patient's family history includes Breast cancer in her maternal aunt; Clotting disorder in her mother; Emphysema in her paternal grandmother and sister; Heart disease in her father and sister; Sleep apnea in her sister; Stroke in her mother.  ROS:   Review of Systems  Constitution: Negative for decreased appetite, fever and weight gain.  HENT: Negative for congestion, ear discharge, hoarse voice and sore throat.   Eyes: Negative for discharge, redness, vision loss in right eye and visual halos.  Cardiovascular: Negative for chest pain, dyspnea on exertion, leg swelling, orthopnea and palpitations.  Respiratory: Negative for cough, hemoptysis, shortness of breath and snoring.   Endocrine: Negative for heat intolerance and polyphagia.  Hematologic/Lymphatic: Negative for bleeding problem. Does not bruise/bleed easily.  Skin: Negative for flushing, nail changes, rash and suspicious lesions.  Musculoskeletal: Negative for arthritis, joint pain, muscle cramps, myalgias, neck pain and stiffness.  Gastrointestinal: Negative for abdominal pain, bowel incontinence, diarrhea and excessive appetite.   Genitourinary: Negative for decreased libido, genital sores and incomplete emptying.  Neurological: Negative for brief paralysis, focal weakness, headaches and loss of balance.  Psychiatric/Behavioral: Negative for altered mental status, depression and suicidal ideas.  Allergic/Immunologic: Negative for HIV exposure and persistent infections.    EKGs/Labs/Other Studies Reviewed:    The following studies were reviewed today:   EKG: None today  LHC 01/05/19  Mid LAD lesion is 10% stenosed.  The left ventricular systolic function is normal.  LV end diastolic pressure is normal.  The left ventricular ejection fraction is 55-65% by visual estimate.  There is no aortic valve stenosis.   No significant CAD.  Continue preventive therapy  Recent Labs: 12/24/2018: BUN 18; Creatinine, Ser 0.87; Hemoglobin 10.8; Platelets 356; Potassium 5.1; Sodium 139  Recent Lipid Panel No results found for: CHOL, TRIG, HDL, CHOLHDL, VLDL, LDLCALC, LDLDIRECT  Physical Exam:    VS:  BP 120/66 (BP Location: Left Arm, Patient Position: Sitting, Cuff Size: Normal)   Pulse 80   Ht 5\' 1"  (1.549 m)   Wt 135 lb (61.2 kg)   SpO2 93%   BMI 25.51 kg/m     Wt Readings from Last 3 Encounters:  05/25/19 135 lb (61.2 kg)  02/02/19 143 lb (64.9 kg)  01/05/19 144 lb (65.3 kg)     GEN: Well nourished, well developed in no acute distress HEENT: Normal NECK: No JVD; No carotid bruits LYMPHATICS: No lymphadenopathy CARDIAC: S1S2 noted,RRR, no murmurs, rubs, gallops RESPIRATORY:  Clear to auscultation without rales, wheezing or rhonchi  ABDOMEN: Soft, non-tender,  non-distended, +bowel sounds, no guarding. EXTREMITIES: No edema, No cyanosis, no clubbing MUSCULOSKELETAL:  No deformity  SKIN: Warm and dry NEUROLOGIC:  Alert and oriented x 3, non-focal PSYCHIATRIC:  Normal affect, good insight  ASSESSMENT:    1. Paroxysmal atrial fibrillation (HCC)   2. Essential hypertension   3. Mild CAD   4. Mixed  hyperlipidemia    PLAN:    1.  I am going to continue to hold her Eliquis for now.  As the risk of bleeding outweighs the benefits right now.  Continue patient on available 3.125 mg twice daily for rate control.  2.  Hypertension, continue patient on her current medication regimen no changes.  3.  Mild CAD continue atorvastatin, she still on aspirin 81 milligrams daily which she says was okay with her GI doctor.  I also asked her to speak with her GI doctor at her next appointment to see if when they will be safe to restart her anticoagulation but for now we will hold off.  The patient is in agreement with the above plan. The patient left the office in stable condition.  The patient will follow up in 3 months or sooner if needed.   Medication Adjustments/Labs and Tests Ordered: Current medicines are reviewed at length with the patient today.  Concerns regarding medicines are outlined above.  No orders of the defined types were placed in this encounter.  No orders of the defined types were placed in this encounter.   Patient Instructions  Medication Instructions:  No medication changes *If you need a refill on your cardiac medications before your next appointment, please call your pharmacy*   Lab Work: None ordered If you have labs (blood work) drawn today and your tests are completely normal, you will receive your results only by: Marland Kitchen MyChart Message (if you have MyChart) OR . A paper copy in the mail If you have any lab test that is abnormal or we need to change your treatment, we will call you to review the results.   Testing/Procedures: None ordered   Follow-Up: At [facility], you and your health needs are our priority.  As part of our continuing mission to provide you with exceptional heart care, we have created designated Provider Care Teams.  These Care Teams include your primary Cardiologist (physician) and Advanced Practice Providers (APPs -  Physician Assistants  and Nurse Practitioners) who all work together to provide you with the care you need, when you need it.  We recommend signing up for the patient portal called "MyChart".  Sign up information is provided on this After Visit Summary.  MyChart is used to connect with patients for Virtual Visits (Telemedicine).  Patients are able to view lab/test results, encounter notes, upcoming appointments, etc.  Non-urgent messages can be sent to your provider as well.   To learn more about what you can do with MyChart, go to [email or web address].    Your next appointment:   3 month(s)  The format for your next appointment:   In Person  Provider:   Berniece Salines, DO   Other Instructions NA     Adopting a Healthy Lifestyle.  Know what a healthy weight is for you (roughly BMI <25) and aim to maintain this   Aim for 7+ servings of fruits and vegetables daily   65-80+ fluid ounces of water or unsweet tea for healthy kidneys   Limit to max 1 drink of alcohol per day; avoid smoking/tobacco   Limit animal fats in diet  for cholesterol and heart health - choose grass fed whenever available   Avoid highly processed foods, and foods high in saturated/trans fats   Aim for low stress - take time to unwind and care for your mental health   Aim for 150 min of moderate intensity exercise weekly for heart health, and weights twice weekly for bone health   Aim for 7-9 hours of sleep daily   When it comes to diets, agreement about the perfect plan isnt easy to find, even among the experts. Experts at the [employer] developed an idea known as the Healthy Eating Plate. Just imagine a plate divided into logical, healthy portions.   The emphasis is on diet quality:   Load up on vegetables and fruits - one-half of your plate: Aim for color and variety, and remember that potatoes dont count.   Go for whole grains - one-quarter of your plate: Whole wheat, barley, wheat berries, quinoa,  oats, brown rice, and foods made with them. If you want pasta, go with whole wheat pasta.   Protein power - one-quarter of your plate: Fish, chicken, beans, and nuts are all healthy, versatile protein sources. Limit red meat.   The diet, however, does go beyond the plate, offering a few other suggestions.   Use healthy plant oils, such as olive, canola, soy, corn, sunflower and peanut. Check the labels, and avoid partially hydrogenated oil, which have unhealthy trans fats.   If youre thirsty, drink water. Coffee and tea are good in moderation, but skip sugary drinks and limit milk and dairy products to one or two daily servings.   The type of carbohydrate in the diet is more important than the amount. Some sources of carbohydrates, such as vegetables, fruits, whole grains, and beans-are healthier than others.   Finally, stay active  Signed, Berniece Salines, DO  05/25/2019 1:58 PM    [facility]

## 2019-05-25 NOTE — Patient Instructions (Signed)
Medication Instructions:  No medication changes. *If you need a refill on your cardiac medications before your next appointment, please call your pharmacy*   Lab Work: None ordered If you have labs (blood work) drawn today and your tests are completely normal, you will receive your results only by: . MyChart Message (if you have MyChart) OR . A paper copy in the mail If you have any lab test that is abnormal or we need to change your treatment, we will call you to review the results.   Testing/Procedures: None ordered   Follow-Up: At [facility], you and your health needs are our priority.  As part of our continuing mission to provide you with exceptional heart care, we have created designated Provider Care Teams.  These Care Teams include your primary Cardiologist (physician) and Advanced Practice Providers (APPs -  Physician Assistants and Nurse Practitioners) who all work together to provide you with the care you need, when you need it.  We recommend signing up for the patient portal called "MyChart".  Sign up information is provided on this After Visit Summary.  MyChart is used to connect with patients for Virtual Visits (Telemedicine).  Patients are able to view lab/test results, encounter notes, upcoming appointments, etc.  Non-urgent messages can be sent to your provider as well.   To learn more about what you can do with MyChart, go to [email or web address].    Your next appointment:   3 month(s)  The format for your next appointment:   In Person  Provider:   Kardie Tobb, DO   Other Instructions NA 

## 2019-05-26 ENCOUNTER — Ambulatory Visit (INDEPENDENT_AMBULATORY_CARE_PROVIDER_SITE_OTHER): Payer: Medicare PPO | Admitting: Pulmonary Disease

## 2019-05-26 ENCOUNTER — Encounter: Payer: Self-pay | Admitting: Pulmonary Disease

## 2019-05-26 DIAGNOSIS — Z9981 Dependence on supplemental oxygen: Secondary | ICD-10-CM | POA: Diagnosis not present

## 2019-05-26 DIAGNOSIS — I480 Paroxysmal atrial fibrillation: Secondary | ICD-10-CM | POA: Diagnosis not present

## 2019-05-26 DIAGNOSIS — J449 Chronic obstructive pulmonary disease, unspecified: Secondary | ICD-10-CM | POA: Diagnosis not present

## 2019-05-26 DIAGNOSIS — J9611 Chronic respiratory failure with hypoxia: Secondary | ICD-10-CM

## 2019-05-26 DIAGNOSIS — Z7901 Long term (current) use of anticoagulants: Secondary | ICD-10-CM

## 2019-05-26 DIAGNOSIS — Z7952 Long term (current) use of systemic steroids: Secondary | ICD-10-CM

## 2019-05-26 NOTE — Progress Notes (Signed)
Virtual Visit via Video Note  I connected with Desiree [name] on 05/26/19 at 11:30 AM EDT by a video enabled telemedicine application and verified that I am speaking with the correct person using two identifiers.  Location: Patient: Desiree [name]  Provider: Garner Nash, DO    I discussed the limitations of evaluation and management by telemedicine and the availability of in person appointments. The patient expressed understanding and agreed to proceed.  History of Present Illness:  This is a 74 year old female past medical history of COPD, followed for abnormal chest imaging prior lung nodule resolved also has evidence of bronchiectasis.  Patient also last seen in the office via telemetry visit.  Reviewed recent documentation from rheumatologist.  History of pulmonary embolism, history of rheumatoid arthritis on chronic steroids 5 mg plus Plaquenil.  Patient has moderate to severe obstructive lung disease with chronic hypoxemic respiratory failure.  Oxygen requirement is now new for her.  She has evidence of a possible MAI.  Telephone visit today to discuss COPD management and current symptoms.  Of note she does tell me that she was recently admitted to [facility] after primary care doctor found her to have low hemoglobin level.  She is found to have a hemoglobin of 6.  She was transfused blood products and underwent EGD and colonoscopy.  They did not find a source of bleeding.  She is saying that they are currently observing her for this.  As from respiratory standpoint she is breathing well.  Using her inhalers regularly.  No significant sputum production.  Has a POC that supplies her oxygen need.  At this point she states her O2 levels in the morning are stable above 90%.  She checks this daily.  She is able to complete most days her activities with limitation however from feeling dyspneic.  Observations/Objective: Able to speak in complete sentences.  No cough, nonlabored  breathing  Assessment and Plan: COPD Chronic hypoxemic respiratory failure on nasal cannula oxygen supplementation via POC, benefits from oxygen therapy. History of anemia, currently being worked up History of paroxysmal atrial fibrillation, on anticoagulation   Plan: Continue Symbicort plus Spiriva Continue oxygen therapy day and nighttime Continue use of POC If needed okay to submit DME paperwork for oxygen needs.  Follow Up Instructions:  Patient is usually followed by myself or Patricia Nettle, NP.  Would recommend 29-month office visit with TP followed by next visit with me.   I discussed the assessment and treatment plan with the patient. The patient was provided an opportunity to ask questions and all were answered. The patient agreed with the plan and demonstrated an understanding of the instructions.   The patient was advised to call back or seek an in-person evaluation if the symptoms worsen or if the condition fails to improve as anticipated.  I provided 22 minutes of non-face-to-face time during this encounter.   Garner Nash, DO

## 2019-06-02 ENCOUNTER — Other Ambulatory Visit: Payer: Self-pay

## 2019-06-02 MED ORDER — CARVEDILOL 3.125 MG PO TABS: 3.1250 mg | ORAL_TABLET | Freq: Two times a day (BID) | ORAL | 0 refills | Status: DC

## 2019-06-02 NOTE — Telephone Encounter (Signed)
Rx refill sent to pharmacy. 

## 2019-06-02 NOTE — Telephone Encounter (Signed)
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

## 2019-06-02 NOTE — Telephone Encounter (Signed)
*  STAT* If patient is at the pharmacy, call can be transferred to refill team.   1. Which medications need to be refilled? (please list name of each medication and dose if known) carvedilol (COREG) 3.125 MG tablet  2. Which pharmacy/location (including street and city if local pharmacy) is medication to be sent to? [place], [place]  3. Do they need a 30 day or 90 day supply? 90 day  Patient is out medication

## 2019-06-21 ENCOUNTER — Telehealth: Payer: Self-pay | Admitting: Cardiology

## 2019-06-21 NOTE — Telephone Encounter (Signed)
Spoke to the receptionist at [employer]. She is going to fax over the note for Dr. Harriet Masson to review.

## 2019-06-21 NOTE — Telephone Encounter (Signed)
Spoke to the patient just now and she let me know that her PCP said that it was up to Dr. Harriet Masson if she wanted the patient to restart her Eliquis or not. The patient has a concern for her bleeding risk but also is concerned that without the Eliquis she will have a stroke.   Routing to Dr. Harriet Masson for further advise and recommendations.

## 2019-06-21 NOTE — Telephone Encounter (Signed)
New Message    Pt is calling to speak with Dr Harriet Masson in regards to Eliquis  She said she spoke with her PCP about this medication and would like to discuss it with Dr Harriet Masson    Please call

## 2019-06-21 NOTE — Telephone Encounter (Signed)
Looks like she potentially saw Dr. Lyda Jester. Can we request any updated records from his office for Dr. Harriet Masson to review prior to making that decision?   Thanks! Loel Dubonnet, NP

## 2019-06-29 ENCOUNTER — Other Ambulatory Visit: Payer: Self-pay

## 2019-06-29 MED ORDER — ATORVASTATIN CALCIUM 20 MG PO TABS: 20.0000 mg | ORAL_TABLET | Freq: Every day | ORAL | 3 refills | Status: DC

## 2019-07-12 ENCOUNTER — Other Ambulatory Visit: Payer: Self-pay | Admitting: *Deleted

## 2019-07-12 MED ORDER — OMEPRAZOLE 20 MG PO CPDR: 20.0000 mg | DELAYED_RELEASE_CAPSULE | Freq: Every day | ORAL | 1 refills | Status: DC

## 2019-07-15 ENCOUNTER — Telehealth: Payer: Self-pay | Admitting: Pulmonary Disease

## 2019-07-15 NOTE — Telephone Encounter (Signed)
Spoke with patient. She stated that she will be going to [facility] in a few weeks and is trying to figure out her O2 situation. She knows that she can not take the home concentrator with her and the POC will not last during the night. She wants to know if she will be ok to not sleep with O2 at night for 4 nights.   I did advise her that we could try to coordinate a O2 concentrator for her if [employer] has offices in [facility]. I did a search online to see if they had an office there. The closest office [place] over [place]. Did a search for [employer] ([employer]'s parent company), they have 2 offices in [place] under the names of [facility] and [facility].   Dr. Valeta Harms, please advise. Thanks!

## 2019-07-16 NOTE — Telephone Encounter (Signed)
PCCM:  It would not be advisable for the patient to go without her oxygen.  Maybe we can arrange to have oxygen tanks from a separate oxygen DME supply company to be delivered toward her destination.  Unfortunately am not sure how to arrange this.  Also we should know whether the patient plans to fly or drive.  In a pressurized cabin the POC is unlikely to supply the necessary oxygen that she would need for the flight.  Garner Nash, DO [facility] 07/16/2019 11:38 AM

## 2019-07-18 NOTE — Telephone Encounter (Signed)
PCCM:  So, oxygen use in an airplane has to be approved by her airline. There are usually forms to fill out regarding this. Also, she has to have a FAA approved POC. I am not sure she has this. She has lots of questions to be answered. She needs to get these forms and contact her airline. At that point I suggest an office appt with one of the apps. I think she see TP on occasion. I am not in the office for several weeks.   Thanks  Garner Nash, DO [facility] 07/18/2019 9:18 AM

## 2019-07-18 NOTE — Telephone Encounter (Signed)
Spoke with the pt and notified of response per Dr Valeta Harms  She does plan to fly there  She states she uses 2 lpm on the POC and wants to know if she turns up to a higher liter flow would that help?

## 2019-07-18 NOTE — Telephone Encounter (Signed)
Spoke with the pt and notified of response per Dr Valeta Harms  She verbalized understanding  Will call once has appropriate forms to schedule appt with TP

## 2019-07-27 ENCOUNTER — Other Ambulatory Visit: Payer: Self-pay | Admitting: *Deleted

## 2019-07-27 MED ORDER — MONTELUKAST SODIUM 10 MG PO TABS: 10.0000 mg | ORAL_TABLET | Freq: Every day | ORAL | 1 refills | Status: DC

## 2019-08-26 ENCOUNTER — Other Ambulatory Visit: Payer: Self-pay

## 2019-08-26 MED ORDER — CARVEDILOL 3.125 MG PO TABS: 3.1250 mg | ORAL_TABLET | Freq: Two times a day (BID) | ORAL | 2 refills | Status: DC

## 2019-08-26 NOTE — Telephone Encounter (Signed)
The first refill request was set to print not normal so resubmitted the refill with the correct settings.

## 2019-08-26 NOTE — Addendum Note (Signed)
Addended by: Khasir Woodrome L on: 08/26/2019 10:09 AM   Modules accepted: Orders

## 2019-08-26 NOTE — Telephone Encounter (Signed)
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 Harriet Masson

## 2019-08-30 ENCOUNTER — Telehealth (INDEPENDENT_AMBULATORY_CARE_PROVIDER_SITE_OTHER): Payer: Medicare PPO | Admitting: Cardiology

## 2019-08-30 DIAGNOSIS — I2510 Atherosclerotic heart disease of native coronary artery without angina pectoris: Secondary | ICD-10-CM | POA: Diagnosis not present

## 2019-08-30 DIAGNOSIS — E782 Mixed hyperlipidemia: Secondary | ICD-10-CM

## 2019-08-30 DIAGNOSIS — R7303 Prediabetes: Secondary | ICD-10-CM | POA: Diagnosis not present

## 2019-08-30 DIAGNOSIS — N183 Chronic kidney disease, stage 3 unspecified: Secondary | ICD-10-CM

## 2019-08-30 DIAGNOSIS — Z79899 Other long term (current) drug therapy: Secondary | ICD-10-CM

## 2019-08-30 DIAGNOSIS — I10 Essential (primary) hypertension: Secondary | ICD-10-CM

## 2019-08-30 DIAGNOSIS — Z7901 Long term (current) use of anticoagulants: Secondary | ICD-10-CM

## 2019-08-30 MED ORDER — APIXABAN 2.5 MG PO TABS: 2.5000 mg | ORAL_TABLET | Freq: Two times a day (BID) | ORAL | 2 refills | Status: DC

## 2019-08-30 NOTE — Patient Instructions (Signed)
Medication Instructions:  Your physician has recommended you make the following change in your medication:   STOP: aspirin  Start: Eliquis 2.5 mg twice daily   *If you need a refill on your cardiac medications before your next appointment, please call your pharmacy*   Lab Work: Your physician recommends that you return for lab work in 1 month: cbc   If you have labs (blood work) drawn today and your tests are completely normal, you will receive your results only by: Marland Kitchen MyChart Message (if you have MyChart) OR . A paper copy in the mail If you have any lab test that is abnormal or we need to change your treatment, we will call you to review the results.   Testing/Procedures: None.    Follow-Up: At [facility], you and your health needs are our priority.  As part of our continuing mission to provide you with exceptional heart care, we have created designated Provider Care Teams.  These Care Teams include your primary Cardiologist (physician) and Advanced Practice Providers (APPs -  Physician Assistants and Nurse Practitioners) who all work together to provide you with the care you need, when you need it.  We recommend signing up for the patient portal called "MyChart".  Sign up information is provided on this After Visit Summary.  MyChart is used to connect with patients for Virtual Visits (Telemedicine).  Patients are able to view lab/test results, encounter notes, upcoming appointments, etc.  Non-urgent messages can be sent to your provider as well.   To learn more about what you can do with MyChart, go to [email or web address].    Your next appointment:   6 month(s)  The format for your next appointment:   In Person  Provider:   Berniece Salines, DO   Other Instructions  Apixaban oral tablets What is this medicine? APIXABAN (a PIX a ban) is an anticoagulant (blood thinner). It is used to lower the chance of stroke in people with a medical condition called atrial  fibrillation. It is also used to treat or prevent blood clots in the lungs or in the veins. This medicine may be used for other purposes; ask your health care provider or pharmacist if you have questions. COMMON BRAND NAME(S): Eliquis What should I tell my health care provider before I take this medicine? They need to know if you have any of these conditions:  antiphospholipid antibody syndrome  bleeding disorders  bleeding in the brain  blood in your stools (black or tarry stools) or if you have blood in your vomit  history of blood clots  history of stomach bleeding  kidney disease  liver disease  mechanical heart valve  an unusual or allergic reaction to apixaban, other medicines, foods, dyes, or preservatives  pregnant or trying to get pregnant  breast-feeding How should I use this medicine? Take this medicine by mouth with a glass of water. Follow the directions on the prescription label. You can take it with or without food. If it upsets your stomach, take it with food. Take your medicine at regular intervals. Do not take it more often than directed. Do not stop taking except on your doctor's advice. Stopping this medicine may increase your risk of a blood clot. Be sure to refill your prescription before you run out of medicine. Talk to your pediatrician regarding the use of this medicine in children. Special care may be needed. Overdosage: If you think you have taken too much of this medicine contact a poison control center or  emergency room at once. NOTE: This medicine is only for you. Do not share this medicine with others. What if I miss a dose? If you miss a dose, take it as soon as you can. If it is almost time for your next dose, take only that dose. Do not take double or extra doses. What may interact with this medicine? This medicine may interact with the following:  aspirin and aspirin-like medicines  certain medicines for fungal infections like ketoconazole  and itraconazole  certain medicines for seizures like carbamazepine and phenytoin  certain medicines that treat or prevent blood clots like warfarin, enoxaparin, and dalteparin  clarithromycin  NSAIDs, medicines for pain and inflammation, like ibuprofen or naproxen  rifampin  ritonavir  St. John's wort This list may not describe all possible interactions. Give your health care provider a list of all the medicines, herbs, non-prescription drugs, or dietary supplements you use. Also tell them if you smoke, drink alcohol, or use illegal drugs. Some items may interact with your medicine. What should I watch for while using this medicine? Visit your healthcare professional for regular checks on your progress. You may need blood work done while you are taking this medicine. Your condition will be monitored carefully while you are receiving this medicine. It is important not to miss any appointments. Avoid sports and activities that might cause injury while you are using this medicine. Severe falls or injuries can cause unseen bleeding. Be careful when using sharp tools or knives. Consider using an Copy. Take special care brushing or flossing your teeth. Report any injuries, bruising, or red spots on the skin to your healthcare professional. If you are going to need surgery or other procedure, tell your healthcare professional that you are taking this medicine. Wear a medical ID bracelet or chain. Carry a card that describes your disease and details of your medicine and dosage times. What side effects may I notice from receiving this medicine? Side effects that you should report to your doctor or health care professional as soon as possible:  allergic reactions like skin rash, itching or hives, swelling of the face, lips, or tongue  signs and symptoms of bleeding such as bloody or black, tarry stools; red or dark-brown urine; spitting up blood or brown material that looks like coffee  grounds; red spots on the skin; unusual bruising or bleeding from the eye, gums, or nose  signs and symptoms of a blood clot such as chest pain; shortness of breath; pain, swelling, or warmth in the leg  signs and symptoms of a stroke such as changes in vision; confusion; trouble speaking or understanding; severe headaches; sudden numbness or weakness of the face, arm or leg; trouble walking; dizziness; loss of coordination This list may not describe all possible side effects. Call your doctor for medical advice about side effects. You may report side effects to FDA at 1-800-FDA-1088. Where should I keep my medicine? Keep out of the reach of children. Store at room temperature between 20 and 25 degrees C (68 and 77 degrees F). Throw away any unused medicine after the expiration date. NOTE: This sheet is a summary. It may not cover all possible information. If you have questions about this medicine, talk to your doctor, pharmacist, or health care provider.  2020 Elsevier/Gold Standard (2017-09-30 17:39:34)

## 2019-08-30 NOTE — Progress Notes (Signed)
Date:  08/30/2019   ID:  Desiree [name], DOB 10-Jul-1945, MRN [id number]  The patient is at home, I am in the clinic   PCP:  Raina Mina., MD  Cardiologist:  Berniece Salines, DO  Electrophysiologist:  None   Evaluation Performed: Follow-up visit  Chief Complaint: Follow-up visit, I am doing fine  History of Present Illness:    The patient does not have symptoms concerning for COVID-19 infection (fever, chills, cough, or new shortness of breath).   Desiree [name] is a 74 y.o. female with a hx of paroxysmal atrial fibrillation was taken off anticoagulation due to significant GI bleed edema, mild coronary artery disease, hypertension, history of pulmonary embolism presents today for follow-up visit.  I did see the patient in December 2020 at that time she appears to be doing well from a cardiovascular standpoint.  She was on Eliquis 5 mg twice a day for atrial fibrillation.  She also has been started on oxygen for her COPD and she tells me that it made a difference.  In the interim the patient did see her PCP, and reported significant fatigue and weakness and found to have anemia.  Have a hemoglobin of 6.4 on April 15, 2019, she received 2 units of packed red blood cells and improved to 10.2.  The patient had a endoscopy on May 04, 2019 which showed a hiatal hernia but otherwise normal upper endoscopy.  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  There was mild internal and small external hemorrhoids.  At this time continue to hold her Eliquis per GI, she reports that her generalized weakness is improving but not yet resolved.   I last saw her on April 21,2021 at that time she was still off her Eliquis pending GI evaluation.  Therefore we held her Eliquis until she is cleared by GI.  Tells me that she did her GI doctor and she was cleared to be started on a lower dose of Eliquis.  She denies  any chest pain, shortness of breath.   Past Medical History:  Diagnosis Date  . Cat allergies   . COPD (chronic obstructive pulmonary disease) ([place])   . Hypertension   . Osteoporosis   . Pulmonary embolism ([place]) 2017  . Rheumatoid arthritis(714.0)    Dr Tobie Lords follows   Past Surgical History:  Procedure Laterality Date  . CYSTECTOMY Right    breast  . LEFT HEART CATH AND CORONARY ANGIOGRAPHY N/A 01/05/2019   Procedure: LEFT HEART CATH AND CORONARY ANGIOGRAPHY;  Surgeon: Jettie Booze, MD;  Location: [facility];  Service: Cardiovascular;  Laterality: N/A;     Current Meds  Medication Sig  . albuterol (PROAIR HFA) 108 (90 Base) MCG/ACT inhaler Inhale 2 puffs into the lungs every 4 (four) hours as needed for wheezing or shortness of breath.  Marland Kitchen amLODipine (NORVASC) 10 MG tablet Take 5 mg by mouth daily.  Marland Kitchen atorvastatin (LIPITOR) 20 MG tablet Take 1 tablet (20 mg total) by mouth daily.  . budesonide-formoterol (SYMBICORT) 160-4.5 MCG/ACT inhaler Inhale 2 puffs into the lungs every 12 (twelve) hours.  . carvedilol (COREG) 3.125 MG tablet Take 1 tablet (3.125 mg total) by mouth 2 (two) times daily with a meal.  . cholecalciferol (VITAMIN D) 25 MCG (1000 UT) tablet Take 1,000 Units by mouth daily.   . ferrous sulfate 325 (65 FE) MG EC tablet Take 325  mg by mouth in the morning and at bedtime.  . fexofenadine (ALLEGRA) 180 MG tablet Take 180 mg by mouth daily.  . hydroxychloroquine (PLAQUENIL) 200 MG tablet Take 200 mg by mouth 2 (two) times daily.   Marland Kitchen leflunomide (ARAVA) 20 MG tablet Take 20 mg by mouth daily.  . montelukast (SINGULAIR) 10 MG tablet Take 1 tablet (10 mg total) by mouth at bedtime.  Marland Kitchen omeprazole (PRILOSEC) 20 MG capsule Take 1 capsule (20 mg total) by mouth daily.  . predniSONE (DELTASONE) 5 MG tablet Take 5 mg by mouth daily with breakfast.  . sertraline (ZOLOFT) 100 MG tablet Take 100 mg by mouth daily.   . Tiotropium Bromide Monohydrate (SPIRIVA  RESPIMAT) 2.5 MCG/ACT AERS Inhale 2 puffs into the lungs daily.  . valsartan (DIOVAN) 320 MG tablet Take 320 mg by mouth daily.  . vitamin B-12 (CYANOCOBALAMIN) 1000 MCG tablet Take 1,000 mcg by mouth daily.   . [DISCONTINUED] aspirin EC 81 MG tablet Take 81 mg by mouth daily.     Allergies:   Other   Social History   Tobacco Use  . Smoking status: Former Smoker    Packs/day: 1.00    Years: 40.00    Pack years: 40.00    Types: Cigarettes    Quit date: 02/03/2001    Years since quitting: 18.5  . Smokeless tobacco: Never Used  Vaping Use  . Vaping Use: Never used  Substance Use Topics  . Alcohol use: Yes    Alcohol/week: 2.0 standard drinks    Types: 2 Standard drinks or equivalent per week    Comment: 2 drinks daily  . Drug use: No     Family Hx: The patient's family history includes Breast cancer in her maternal aunt; Clotting disorder in her mother; Emphysema in her paternal grandmother and sister; Heart disease in her father and sister; Sleep apnea in her sister; Stroke in her mother.  ROS:   Please see the history of present illness.     Review of Systems  Constitution: Negative for decreased appetite, fever and weight gain.  HENT: Negative for congestion, ear discharge, hoarse voice and sore throat.   Eyes: Negative for discharge, redness, vision loss in right eye and visual halos.  Cardiovascular: Negative for chest pain, dyspnea on exertion, leg swelling, orthopnea and palpitations.  Respiratory: Negative for cough, hemoptysis, shortness of breath and snoring.   Endocrine: Negative for heat intolerance and polyphagia.  Hematologic/Lymphatic: Negative for bleeding problem. Does not bruise/bleed easily.  Skin: Negative for flushing, nail changes, rash and suspicious lesions.  Musculoskeletal: Negative for arthritis, joint pain, muscle cramps, myalgias, neck pain and stiffness.  Gastrointestinal: Negative for abdominal pain, bowel incontinence, diarrhea and excessive  appetite.  Genitourinary: Negative for decreased libido, genital sores and incomplete emptying.  Neurological: Negative for brief paralysis, focal weakness, headaches and loss of balance.  Psychiatric/Behavioral: Negative for altered mental status, depression and suicidal ideas.  Allergic/Immunologic: Negative for HIV exposure and persistent infections.     Prior CV studies:   The following studies were reviewed today:   LHC 01/05/19  Mid LAD lesion is 10% stenosed.  The left ventricular systolic function is normal.  LV end diastolic pressure is normal.  The left ventricular ejection fraction is 55-65% by visual estimate.  There is no aortic valve stenosis. No significant CAD. Continue preventive therapy  Labs/Other Tests and Data Reviewed:    EKG:  None today   Recent Labs: 12/24/2018: BUN 18; Creatinine, Ser 0.87; Hemoglobin 10.8;  Platelets 356; Potassium 5.1; Sodium 139   Recent Lipid Panel No results found for: CHOL, TRIG, HDL, CHOLHDL, LDLCALC, LDLDIRECT  Wt Readings from Last 3 Encounters:  05/25/19 135 lb (61.2 kg)  02/02/19 143 lb (64.9 kg)  01/05/19 144 lb (65.3 kg)     Objective:    Vital Signs:  There were no vitals taken for this visit.   Unable to get vitals-patient says she cannot find her blood pressure cuff  ASSESSMENT & PLAN:    Paroxysmal atrial fibrillation-the patient has been cleared by her GI doctor at this time we will start her on low-dose Eliquis 2.5 mg twice daily.  She tells me that he did not find any source of bleeding so he is okay with her going back on this medication.  Continue her Coreg 3.125 mg twice daily.  Hypertension-unfortunately we were not able to get the patient's blood pressure reading today.  She tells me that her PCP cut back on her amlodipine so she takes 5 mg daily.  With her Coreg 3.125 mg daily.  Mild coronary artery disease-continue patient on atorvastatin.  Advised to stop her aspirin 1 mg daily  The patient  is in agreement with the above plan. The patient left the office in stable condition.  The patient will follow up in 6 months or sooner if needed.  COVID-19 Education: The signs and symptoms of COVID-19 were discussed with the patient and how to seek care for testing (follow up with PCP or arrange E-visit). The importance of social distancing was discussed today.  Time:   Today, I have spent 15 minutes with the patient with telehealth technology discussing the above problems.     Medication Adjustments/Labs and Tests Ordered: Current medicines are reviewed at length with the patient today.  Concerns regarding medicines are outlined above.   Tests Ordered: Orders Placed This Encounter  Procedures  . CBC    Medication Changes: Meds ordered this encounter  Medications  . apixaban (ELIQUIS) 2.5 MG TABS tablet    Sig: Take 1 tablet (2.5 mg total) by mouth 2 (two) times daily.    Dispense:  60 tablet    Refill:  2    Follow Up: 6 months or sooner if needed.  SignedBerniece Salines, DO  08/30/2019 4:25 PM    [facility]

## 2019-09-02 NOTE — Telephone Encounter (Signed)
Okay to speak up to 2.5 mg then

## 2019-09-28 ENCOUNTER — Other Ambulatory Visit: Payer: Self-pay

## 2019-09-28 NOTE — Telephone Encounter (Signed)
Refill denied for Amlodipine 10 mg as the patient is now taking 5 mg of Amlodipine.

## 2019-09-30 ENCOUNTER — Other Ambulatory Visit: Payer: Self-pay | Admitting: *Deleted

## 2019-09-30 MED ORDER — AMLODIPINE BESYLATE 5 MG PO TABS: 5.0000 mg | ORAL_TABLET | Freq: Every day | ORAL | 1 refills | Status: DC

## 2019-10-01 LAB — CBC
Hematocrit: 33.4 % — ABNORMAL LOW (ref 34.0–46.6)
Hemoglobin: 11.1 g/dL (ref 11.1–15.9)
MCH: 33 pg (ref 26.6–33.0)
MCHC: 33.2 g/dL (ref 31.5–35.7)
MCV: 99 fL — ABNORMAL HIGH (ref 79–97)
Platelets: 333 10*3/uL (ref 150–450)
RBC: 3.36 x10E6/uL — ABNORMAL LOW (ref 3.77–5.28)
RDW: 12.3 % (ref 11.7–15.4)
WBC: 8.5 10*3/uL (ref 3.4–10.8)

## 2019-11-16 ENCOUNTER — Telehealth: Payer: Self-pay | Admitting: Adult Health

## 2019-11-16 NOTE — Telephone Encounter (Signed)
Spoke with the pt  She has appt with TP 11/21/19  She states it is difficult to leave her house with being on o2  Wants to change this to a televisit  Last seen in person 11/23/18  Please advise Thanks

## 2019-11-18 NOTE — Telephone Encounter (Signed)
I have changed pt's appt to a televisit. Attempted to call pt to let her know that this had been done but unable to reach. Left pt a detailed message that her visit with TP on 10/18 will be a televisit. Nothing further needed.

## 2019-11-18 NOTE — Telephone Encounter (Signed)
Of course that is fine .

## 2019-11-21 ENCOUNTER — Encounter: Payer: Self-pay | Admitting: Adult Health

## 2019-11-21 ENCOUNTER — Other Ambulatory Visit: Payer: Self-pay

## 2019-11-21 ENCOUNTER — Ambulatory Visit (INDEPENDENT_AMBULATORY_CARE_PROVIDER_SITE_OTHER): Payer: Medicare PPO | Admitting: Adult Health

## 2019-11-21 DIAGNOSIS — J9611 Chronic respiratory failure with hypoxia: Secondary | ICD-10-CM

## 2019-11-21 DIAGNOSIS — J479 Bronchiectasis, uncomplicated: Secondary | ICD-10-CM | POA: Diagnosis not present

## 2019-11-21 DIAGNOSIS — J449 Chronic obstructive pulmonary disease, unspecified: Secondary | ICD-10-CM

## 2019-11-21 NOTE — Progress Notes (Signed)
Virtual Visit via Telephone Note  I connected with Desiree [name] on 11/21/19 at 11:30 AM EDT by telephone and verified that I am speaking with the correct person using two identifiers.  Location: Patient: Home  Provider: Office    I discussed the limitations, risks, security and privacy concerns of performing an evaluation and management service by telephone and the availability of in person appointments. I also discussed with the patient that there may be a patient responsible charge related to this service. The patient expressed understanding and agreed to proceed.   History of Present Illness: 74 year old female former smoker followed for COPD, abnormal CT chest with lung nodules and bronchiectasis first noted in 2017 Medical history significant for pulmonary embolism on low-dose chronic anticoagulation therapy, rheumatoid arthritis on chronic steroids with prednisone 5 mg and immunosuppressants  Today's televisit is a  59-month follow-up.  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 Her breathing is doing okay.  She feels that she is at her baseline.  She remains on Symbicort and Spiriva.  She is had no flare of her cough or wheezing.  Has no increased congestion.  Has not been on any recent antibiotics.  She remains on chronic steroids for her rheumatoid arthritis and is on prednisone 5 mg daily.  She is on oxygen 2 L with activity and at bedtime.  She denies any hemoptysis, chest pain, orthopnea.  Past Medical History:  Diagnosis Date  . Cat allergies   . COPD (chronic obstructive pulmonary disease) ([place])   . Hypertension   . Osteoporosis   . Pulmonary embolism ([place]) 2017  . Rheumatoid arthritis(714.0)    Dr Tobie Lords follows   Current Outpatient Medications on File Prior to Visit  Medication Sig Dispense Refill  . albuterol (PROAIR HFA) 108 (90 Base) MCG/ACT inhaler Inhale 2 puffs into the lungs every 4  (four) hours as needed for wheezing or shortness of breath.    Marland Kitchen amLODipine (NORVASC) 5 MG tablet Take 1 tablet (5 mg total) by mouth daily. 90 tablet 1  . apixaban (ELIQUIS) 2.5 MG TABS tablet Take 1 tablet (2.5 mg total) by mouth 2 (two) times daily. 60 tablet 2  . atorvastatin (LIPITOR) 20 MG tablet Take 1 tablet (20 mg total) by mouth daily. 90 tablet 3  . budesonide-formoterol (SYMBICORT) 160-4.5 MCG/ACT inhaler Inhale 2 puffs into the lungs every 12 (twelve) hours. 1 Inhaler 11  . carvedilol (COREG) 3.125 MG tablet Take 1 tablet (3.125 mg total) by mouth 2 (two) times daily with a meal. 180 tablet 2  . cholecalciferol (VITAMIN D) 25 MCG (1000 UT) tablet Take 1,000 Units by mouth daily.     . ferrous sulfate 325 (65 FE) MG EC tablet Take 325 mg by mouth in the morning and at bedtime.    . fexofenadine (ALLEGRA) 180 MG tablet Take 180 mg by mouth daily.    . hydroxychloroquine (PLAQUENIL) 200 MG tablet Take 200 mg by mouth 2 (two) times daily.   3  . leflunomide (ARAVA) 20 MG tablet Take 20 mg by mouth daily.    . montelukast (SINGULAIR) 10 MG tablet Take 1 tablet (10 mg total) by mouth at bedtime. 90 tablet 1  . omeprazole (PRILOSEC) 20 MG capsule Take 1 capsule (20 mg total) by mouth daily. 90 capsule 1  . predniSONE (DELTASONE) 5 MG tablet Take 5 mg by mouth daily with breakfast.    . sertraline (ZOLOFT) 100 MG tablet Take 100  mg by mouth daily.     . Tiotropium Bromide Monohydrate (SPIRIVA RESPIMAT) 2.5 MCG/ACT AERS Inhale 2 puffs into the lungs daily. 4 g 11  . valsartan (DIOVAN) 320 MG tablet Take 320 mg by mouth daily.    . vitamin B-12 (CYANOCOBALAMIN) 1000 MCG tablet Take 1,000 mcg by mouth daily.      No current facility-administered medications on file prior to visit.      Observations/Objective:  Speaks in full sentences with no audible distress  CT chest December 2020 showed emphysema, near complete resolution of nodular area in the right upper lobe.,  Tree-in-bud  nodularity slightly improved findings remain most compatible with chronic atypical infection such as MAI.   Assessment and Plan: Moderate to severe COPD currently at baseline.  Chronic oxygen pendant respiratory failure at baseline with no increased oxygen demands  Plan  Patient Instructions  Continue on Symbicort 2 puffs twice daily, rinse after use Continue on Spiriva daily Continue on Oxygen 2 L with activity and At bedtime   Follow up with Dr. Valeta Harms in 6 months and As needed   Please contact office for sooner follow up if symptoms do not improve or worsen or seek emergency care       Follow Up Instructions: Follow-up in 6 months and as needed   I discussed the assessment and treatment plan with the patient. The patient was provided an opportunity to ask questions and all were answered. The patient agreed with the plan and demonstrated an understanding of the instructions.   The patient was advised to call back or seek an in-person evaluation if the symptoms worsen or if the condition fails to improve as anticipated.  I provided 22 minutes of non-face-to-face time during this encounter.   Rexene Edison, NP

## 2019-11-21 NOTE — Patient Instructions (Addendum)
Continue on Symbicort 2 puffs twice daily, rinse after use Continue on Spiriva daily Continue on Oxygen 2 L with activity and At bedtime   Follow up with Dr. Valeta Harms in 6 months and As needed   Please contact office for sooner follow up if symptoms do not improve or worsen or seek emergency care

## 2019-11-24 DIAGNOSIS — N83201 Unspecified ovarian cyst, right side: Secondary | ICD-10-CM

## 2019-11-24 DIAGNOSIS — K7469 Other cirrhosis of liver: Secondary | ICD-10-CM

## 2019-11-24 DIAGNOSIS — N83202 Unspecified ovarian cyst, left side: Secondary | ICD-10-CM

## 2019-11-24 HISTORY — DX: Other cirrhosis of liver: K74.69

## 2019-11-24 HISTORY — DX: Unspecified ovarian cyst, right side: N83.201

## 2019-11-24 HISTORY — DX: Unspecified ovarian cyst, left side: N83.202

## 2019-11-24 NOTE — Progress Notes (Signed)
Thanks for speaking with her Garner Nash, DO [facility] 11/24/2019 11:30 AM

## 2019-11-28 ENCOUNTER — Other Ambulatory Visit: Payer: Self-pay | Admitting: Pulmonary Disease

## 2019-11-28 MED ORDER — SPIRIVA RESPIMAT 2.5 MCG/ACT IN AERS: 2.0000 | INHALATION_SPRAY | Freq: Every day | RESPIRATORY_TRACT | 11 refills | Status: DC

## 2019-12-01 ENCOUNTER — Telehealth: Payer: Self-pay | Admitting: Pulmonary Disease

## 2019-12-01 ENCOUNTER — Other Ambulatory Visit: Payer: Self-pay | Admitting: *Deleted

## 2019-12-01 MED ORDER — BUDESONIDE-FORMOTEROL FUMARATE 160-4.5 MCG/ACT IN AERO: 2.0000 | INHALATION_SPRAY | Freq: Two times a day (BID) | RESPIRATORY_TRACT | 11 refills | Status: DC

## 2019-12-01 NOTE — Telephone Encounter (Signed)
LMTCB x1 for pt.  

## 2019-12-02 NOTE — Telephone Encounter (Signed)
Looked at [employer] [facility] in [place]. Found CT report in BI's mail folder.   Called patient to let her know that we have received the report in time for her appt on Friday. She verbalized understanding.   She wanted to know if we had sent over a refill for the Symbicort 160 to [facility] as they stated that they did not receive it. Advised her that it was sent over yesterday. She will call the pharmacy back.   Nothing further needed at time of call.

## 2019-12-02 NOTE — Telephone Encounter (Signed)
Pt calling back to find out if we received her CT disc for her upcoming appt with Dr. Valeta Harms next Friday 01/08/20

## 2019-12-09 ENCOUNTER — Telehealth: Payer: Self-pay | Admitting: Pulmonary Disease

## 2019-12-09 ENCOUNTER — Ambulatory Visit: Payer: Medicare PPO | Admitting: Pulmonary Disease

## 2019-12-09 NOTE — Telephone Encounter (Signed)
Patient would like appointment 01/19/2020 to be a Televisit.. Patient phone number is [phone].

## 2019-12-09 NOTE — Telephone Encounter (Signed)
Yes ok for tele Thanks Garner Nash, DO [facility] 12/09/2019 5:27 PM

## 2019-12-09 NOTE — Telephone Encounter (Signed)
Spoke with the pt  She is asking for her visit 01/19/20 with Dr Valeta Harms to be a televisit  She states it's hard for her to get out of the house  Please advise, thanks!

## 2019-12-09 NOTE — Telephone Encounter (Signed)
Pt aware and the appt was switched

## 2020-01-19 ENCOUNTER — Ambulatory Visit (INDEPENDENT_AMBULATORY_CARE_PROVIDER_SITE_OTHER): Payer: Medicare PPO | Admitting: Pulmonary Disease

## 2020-01-19 ENCOUNTER — Other Ambulatory Visit: Payer: Self-pay

## 2020-01-19 DIAGNOSIS — J449 Chronic obstructive pulmonary disease, unspecified: Secondary | ICD-10-CM

## 2020-01-19 DIAGNOSIS — J479 Bronchiectasis, uncomplicated: Secondary | ICD-10-CM | POA: Diagnosis not present

## 2020-01-19 DIAGNOSIS — R911 Solitary pulmonary nodule: Secondary | ICD-10-CM

## 2020-01-19 MED ORDER — BREZTRI AEROSPHERE 160-9-4.8 MCG/ACT IN AERO: 2.0000 | INHALATION_SPRAY | Freq: Two times a day (BID) | RESPIRATORY_TRACT | 4 refills | Status: DC

## 2020-01-19 NOTE — Patient Instructions (Signed)
Thank you for visiting Dr. Valeta Harms at [facility]. Today we recommend the following:  Orders Placed This Encounter  Procedures  . CT Chest Wo Contrast   Meds ordered this encounter  Medications  . Budeson-Glycopyrrol-Formoterol (BREZTRI AEROSPHERE) 160-9-4.8 MCG/ACT AERO    Sig: Inhale 2 puffs into the lungs 2 (two) times daily.    Dispense:  10.7 g    Refill:  4   Return in about 7 months (around 08/27/2020) for with TP or Dr. Valeta Harms.    Please do your part to reduce the spread of COVID-19.

## 2020-01-19 NOTE — Progress Notes (Signed)
Virtual Visit via Telephone Note  I connected with Desiree [name] on 01/19/20 at  2:45 PM EST by telephone and verified that I am speaking with the correct person using two identifiers.  Location: Patient: Home  Provider: office [address]   I discussed the limitations, risks, security and privacy concerns of performing an evaluation and management service by telephone and the availability of in person appointments. I also discussed with the patient that there may be a patient responsible charge related to this service. The patient expressed understanding and agreed to proceed.   History of Present Illness:  This is a 74 year old female past medical history of COPD, chronic hypoxemic respiratory failure on nasal cannula O2 supplementation, prior imaging with scattered lung nodules, evidence of bronchiectasis.  She has rheumatoid arthritis on steroids, Plaquenil and Arava.  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  Here today on the phone to discuss and review her most recent CT imaging which was completed in October.  This reveals a cluster of nodules.  CT imaging reviewed today in the office.  She has a larger more dominant nodule at 14 mm in the lower lobe.  She also has areas of scattered bronchiectasis and tree-in-bud consistent with MAI.  She does not have a productive cough.  She is not been losing weight.  She does denies hemoptysis.  From a respiratory standpoint she currently managed with triple therapy inhaler regimen.  She uses Symbicort plus Spiriva and occasional albuterol.  She does feel like she has no endurance.  She feels significantly dyspneic on exertion.   Observations/Objective: Patient able to speak in complete sentences.  Please see scanned document from [facility] for CT imaging.  I was able to view the images today in PACS.  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 We discussed swapping to a triple therapy inhaler, breztri New prescription sent to her pharmacy for this. Patient was described and instructed on how to use it. During this time can stop her Symbicort plus Spiriva.  As for follow-up of the lung nodules bronchiectasis and MAI via CT imaging.  We will recommend a repeat CT scan in 9 months.  This has been scheduled for July 2022.  Patient can follow-up after CT imaging complete to discuss results.  Follow Up Instructions:  Due to difficulty for patient coming out of the house okay to continue telephone visits.  Patient to follow-up with myself or Patricia Nettle, NP in July 2022   I discussed the assessment and treatment plan with the patient. The patient was provided an opportunity to ask questions and all were answered. The patient agreed with the plan and demonstrated an understanding of the instructions.   The patient was advised to call back or seek an in-person evaluation if the symptoms worsen or if the condition fails to improve as anticipated.  I provided 22 minutes of non-face-to-face time during this encounter.   Garner Nash, DO

## 2020-01-31 ENCOUNTER — Other Ambulatory Visit: Payer: Self-pay | Admitting: Pulmonary Disease

## 2020-01-31 MED ORDER — OMEPRAZOLE 20 MG PO CPDR: 20.0000 mg | DELAYED_RELEASE_CAPSULE | Freq: Every day | ORAL | 1 refills | Status: DC

## 2020-02-09 ENCOUNTER — Other Ambulatory Visit: Payer: Self-pay

## 2020-02-09 DIAGNOSIS — H8113 Benign paroxysmal vertigo, bilateral: Secondary | ICD-10-CM

## 2020-02-09 HISTORY — DX: Benign paroxysmal vertigo, bilateral: H81.13

## 2020-02-10 ENCOUNTER — Telehealth: Payer: Self-pay

## 2020-02-10 MED ORDER — APIXABAN 2.5 MG PO TABS: 2.5000 mg | ORAL_TABLET | Freq: Two times a day (BID) | ORAL | 5 refills | Status: DC

## 2020-02-10 NOTE — Telephone Encounter (Signed)
Prescription refill request for Eliquis received. Indication:PE Last office visit:7/21  Tobb Scr:0.98 6/21 Age: 75 Weight:61.2 kg  Prescription refilled

## 2020-02-14 NOTE — Telephone Encounter (Signed)
done

## 2020-02-23 DIAGNOSIS — G609 Hereditary and idiopathic neuropathy, unspecified: Secondary | ICD-10-CM

## 2020-02-23 HISTORY — DX: Hereditary and idiopathic neuropathy, unspecified: G60.9

## 2020-03-19 ENCOUNTER — Other Ambulatory Visit: Payer: Self-pay

## 2020-03-19 MED ORDER — AMLODIPINE BESYLATE 5 MG PO TABS: 5.0000 mg | ORAL_TABLET | Freq: Every day | ORAL | 1 refills | Status: DC

## 2020-03-19 NOTE — Telephone Encounter (Signed)
Request for Amlodipine sent to [facility].

## 2020-05-08 ENCOUNTER — Telehealth: Payer: Self-pay | Admitting: Pulmonary Disease

## 2020-05-08 MED ORDER — MONTELUKAST SODIUM 10 MG PO TABS: 10.0000 mg | ORAL_TABLET | Freq: Every day | ORAL | 1 refills | Status: DC

## 2020-05-08 NOTE — Telephone Encounter (Signed)
Patient seen within the year, refill sent to preferred pharmacy

## 2020-05-08 NOTE — Telephone Encounter (Signed)
Called and spoke to [place] with [facility] and was advised nothing further is needed as the script has already been sent to pharmacy. Nothing further needed at this time.

## 2020-05-28 ENCOUNTER — Other Ambulatory Visit: Payer: Self-pay

## 2020-05-28 MED ORDER — CARVEDILOL 3.125 MG PO TABS: 3.1250 mg | ORAL_TABLET | Freq: Two times a day (BID) | ORAL | 0 refills | Status: DC

## 2020-05-28 NOTE — Addendum Note (Signed)
Addended by: Rande Roylance, Jonelle Sidle L on: 05/28/2020 03:07 PM   Modules accepted: Orders

## 2020-05-28 NOTE — Telephone Encounter (Signed)
Refill of Carvedilol 3.125 mg sent to [facility].

## 2020-06-22 ENCOUNTER — Telehealth: Payer: Self-pay | Admitting: Cardiology

## 2020-06-22 ENCOUNTER — Other Ambulatory Visit: Payer: Self-pay

## 2020-06-22 MED ORDER — ATORVASTATIN CALCIUM 20 MG PO TABS: 20.0000 mg | ORAL_TABLET | Freq: Every day | ORAL | 1 refills | Status: DC

## 2020-06-22 NOTE — Telephone Encounter (Signed)
Patient scheduled for next available appointment.

## 2020-06-22 NOTE — Telephone Encounter (Signed)
*  STAT* If patient is at the pharmacy, call can be transferred to refill team.   1. Which medications need to be refilled? (please list name of each medication and dose if known) atorvastatin (LIPITOR) 20 MG tablet   2. Which pharmacy/location (including street and city if local pharmacy) is medication to be sent to? [facility], [place]  3. Do they need a 30 day or 90 day supply? 90 day supply  Pharmacy is calling requesting a 90 day supply be sent instead of 30 per pt request.

## 2020-06-22 NOTE — Telephone Encounter (Signed)
Called patient explained a 30 day script with 1 refill is all we can send for her right now. Her last visit was July of 2021 with Dr. Harriet Masson. Advised the patient we need at least a yearly appointment to fill medications. She understood. She is long over due for her 6 month follow up. She would like to know if she can be scheduled for a video visit. Will check with Dr. Harriet Masson.

## 2020-06-22 NOTE — Addendum Note (Signed)
Addended by: Taneshia Lorence, Jonelle Sidle L on: 06/22/2020 01:30 PM   Modules accepted: Orders

## 2020-06-22 NOTE — Telephone Encounter (Signed)
Refill of Atorvastatin 20 mg to [facility].

## 2020-07-03 ENCOUNTER — Telehealth: Payer: Self-pay | Admitting: Pulmonary Disease

## 2020-07-03 NOTE — Telephone Encounter (Signed)
Working on this

## 2020-07-04 NOTE — Telephone Encounter (Signed)
Sched pt at [facility] for CT on 7/19 at 2:00pm. Pt aware of appt. Nothing further needed.

## 2020-07-11 ENCOUNTER — Other Ambulatory Visit: Payer: Self-pay

## 2020-07-11 MED ORDER — OMEPRAZOLE 20 MG PO CPDR: 20.0000 mg | DELAYED_RELEASE_CAPSULE | Freq: Every day | ORAL | 1 refills | Status: DC

## 2020-07-16 ENCOUNTER — Emergency Department (HOSPITAL_COMMUNITY): Payer: Medicare PPO

## 2020-07-16 ENCOUNTER — Emergency Department (HOSPITAL_COMMUNITY)
Admission: EM | Admit: 2020-07-16 | Discharge: 2020-07-17 | Disposition: A | Discharge status: Home / Self Care | Payer: Medicare PPO | Attending: Emergency Medicine | Admitting: Emergency Medicine

## 2020-07-16 DIAGNOSIS — Z79899 Other long term (current) drug therapy: Secondary | ICD-10-CM | POA: Insufficient documentation

## 2020-07-16 DIAGNOSIS — E440 Moderate protein-calorie malnutrition: Secondary | ICD-10-CM | POA: Diagnosis not present

## 2020-07-16 DIAGNOSIS — I129 Hypertensive chronic kidney disease with stage 1 through stage 4 chronic kidney disease, or unspecified chronic kidney disease: Secondary | ICD-10-CM | POA: Insufficient documentation

## 2020-07-16 DIAGNOSIS — F1010 Alcohol abuse, uncomplicated: Secondary | ICD-10-CM | POA: Diagnosis not present

## 2020-07-16 DIAGNOSIS — J449 Chronic obstructive pulmonary disease, unspecified: Secondary | ICD-10-CM | POA: Diagnosis not present

## 2020-07-16 DIAGNOSIS — I2510 Atherosclerotic heart disease of native coronary artery without angina pectoris: Secondary | ICD-10-CM | POA: Insufficient documentation

## 2020-07-16 DIAGNOSIS — E860 Dehydration: Secondary | ICD-10-CM | POA: Insufficient documentation

## 2020-07-16 DIAGNOSIS — Z87891 Personal history of nicotine dependence: Secondary | ICD-10-CM | POA: Insufficient documentation

## 2020-07-16 DIAGNOSIS — Z7901 Long term (current) use of anticoagulants: Secondary | ICD-10-CM | POA: Insufficient documentation

## 2020-07-16 DIAGNOSIS — R531 Weakness: Secondary | ICD-10-CM | POA: Diagnosis not present

## 2020-07-16 DIAGNOSIS — N183 Chronic kidney disease, stage 3 unspecified: Secondary | ICD-10-CM | POA: Insufficient documentation

## 2020-07-16 DIAGNOSIS — Z7951 Long term (current) use of inhaled steroids: Secondary | ICD-10-CM | POA: Diagnosis not present

## 2020-07-16 DIAGNOSIS — Z955 Presence of coronary angioplasty implant and graft: Secondary | ICD-10-CM | POA: Diagnosis not present

## 2020-07-16 LAB — TROPONIN I (HIGH SENSITIVITY)
Troponin I (High Sensitivity): 32 ng/L — ABNORMAL HIGH (ref <18)
Troponin I (High Sensitivity): 25 ng/L — ABNORMAL HIGH (ref <18)

## 2020-07-16 LAB — CBC WITH DIFFERENTIAL/PLATELET
Abs Immature Granulocytes: 0.06 10*3/uL (ref 0.00–0.07)
Basophils Absolute: 0.1 10*3/uL (ref 0.0–0.1)
Basophils Relative: 1 %
Eosinophils Absolute: 0 10*3/uL (ref 0.0–0.5)
Eosinophils Relative: 0 %
HCT: 28.4 % — ABNORMAL LOW (ref 36.0–46.0)
Hemoglobin: 8.7 g/dL — ABNORMAL LOW (ref 12.0–15.0)
Immature Granulocytes: 1 %
Lymphocytes Relative: 3 %
Lymphs Abs: 0.4 10*3/uL — ABNORMAL LOW (ref 0.7–4.0)
MCH: 32.1 pg (ref 26.0–34.0)
MCHC: 30.6 g/dL (ref 30.0–36.0)
MCV: 104.8 fL — ABNORMAL HIGH (ref 80.0–100.0)
Monocytes Absolute: 1 10*3/uL (ref 0.1–1.0)
Monocytes Relative: 8 %
Neutro Abs: 11.2 10*3/uL — ABNORMAL HIGH (ref 1.7–7.7)
Neutrophils Relative %: 87 %
Platelets: 347 10*3/uL (ref 150–400)
RBC: 2.71 MIL/uL — ABNORMAL LOW (ref 3.87–5.11)
RDW: 13.7 % (ref 11.5–15.5)
WBC: 12.7 10*3/uL — ABNORMAL HIGH (ref 4.0–10.5)
nRBC: 0 % (ref 0.0–0.2)

## 2020-07-16 LAB — COMPREHENSIVE METABOLIC PANEL
ALT: 16 U/L (ref 0–44)
AST: 29 U/L (ref 15–41)
Albumin: 3 g/dL — ABNORMAL LOW (ref 3.5–5.0)
Alkaline Phosphatase: 94 U/L (ref 38–126)
Anion gap: 10 (ref 5–15)
BUN: 15 mg/dL (ref 8–23)
CO2: 24 mmol/L (ref 22–32)
Calcium: 8.9 mg/dL (ref 8.9–10.3)
Chloride: 96 mmol/L — ABNORMAL LOW (ref 98–111)
Creatinine, Ser: 0.76 mg/dL (ref 0.44–1.00)
GFR, Estimated: >60 mL/min (ref >60)
Glucose, Bld: 112 mg/dL — ABNORMAL HIGH (ref 70–99)
Potassium: 4.5 mmol/L (ref 3.5–5.1)
Sodium: 130 mmol/L — ABNORMAL LOW (ref 135–145)
Total Bilirubin: 0.3 mg/dL (ref 0.3–1.2)
Total Protein: 5.9 g/dL — ABNORMAL LOW (ref 6.5–8.1)

## 2020-07-16 LAB — URINALYSIS, ROUTINE W REFLEX MICROSCOPIC
Bacteria, UA: NONE SEEN
Bilirubin Urine: NEGATIVE
Glucose, UA: NEGATIVE mg/dL
Hgb urine dipstick: NEGATIVE
Ketones, ur: NEGATIVE mg/dL
Leukocytes,Ua: NEGATIVE
Nitrite: NEGATIVE
Protein, ur: 30 mg/dL — AB
Specific Gravity, Urine: 1.02 (ref 1.005–1.030)
pH: 5 (ref 5.0–8.0)

## 2020-07-16 LAB — LIPASE, BLOOD: Lipase: 67 U/L — ABNORMAL HIGH (ref 11–51)

## 2020-07-16 LAB — MAGNESIUM: Magnesium: 1.6 mg/dL — ABNORMAL LOW (ref 1.7–2.4)

## 2020-07-16 LAB — BRAIN NATRIURETIC PEPTIDE: B Natriuretic Peptide: 115.9 pg/mL — ABNORMAL HIGH (ref 0.0–100.0)

## 2020-07-16 MED ORDER — IPRATROPIUM-ALBUTEROL 0.5-2.5 (3) MG/3ML IN SOLN
3.0000 mL | RESPIRATORY_TRACT | Status: DC
  Administered 2020-07-17 (×2): 3 mL via RESPIRATORY_TRACT
  Filled 2020-07-16 (×2): qty 3

## 2020-07-16 MED ORDER — LACTATED RINGERS IV BOLUS
1000.0000 mL | Freq: Once | INTRAVENOUS | Status: AC
  Administered 2020-07-17: 1000 mL via INTRAVENOUS

## 2020-07-16 MED ORDER — MAGNESIUM SULFATE 2 GM/50ML IV SOLN
2.0000 g | Freq: Once | INTRAVENOUS | Status: AC
  Administered 2020-07-17: 2 g via INTRAVENOUS
  Filled 2020-07-16: qty 50

## 2020-07-16 NOTE — ED Triage Notes (Signed)
Pt arrives with daughter who reports dizziness x 2 weeks[place] she fell off the toilet on Saturday night, could not get out of a chair last night. Daughter reports some confusion as well. Pt lives in an independent living facility and they report a decrease in her ability to care for herself in the last couple weeks. Advised by PCP to come to ED.

## 2020-07-16 NOTE — ED Provider Notes (Signed)
Emergency Medicine Provider Triage Evaluation Note  Desiree [name] , a 75 y.o. female  was evaluated in triage.  Pt complains of generalized weakness, confusion, progressive shortness of breath over the last 2 weeks.  Patient is on oxygen at baseline secondary to COPD.  Patient's daughter is at the bedside who endorses the patient become increasingly more confused, unable to ambulate on her own has had multiple falls in the last few days.  History of vertigo, taking Antivert for the last 2 weeks without relief.  Review of Systems  Positive: Weakness, SOB, lightheadedness, dizziness, Diarrhea Negative: CP, syncope, Abdominal pain, N/V  Physical Exam  BP 132/69 (BP Location: Right Arm)   Pulse 92   Temp 98.4 F (36.9 C) (Oral)   Resp (!) 22   SpO2 100%  Gen:   Awake, no distress   Resp:  Normal effort  MSK:   Moves extremities without difficulty  Other:  RRR, abdomen soft, nondistended, nontender.   Medical Decision Making  Medically screening exam initiated at 4:04 PM.  Appropriate orders placed.  Desiree [name] was informed that the remainder of the evaluation will be completed by another provider, this initial triage assessment does not replace that evaluation, and the importance of remaining in the ED until their evaluation is complete.  This chart was dictated using voice recognition software, Dragon. Despite the best efforts of this provider to proofread and correct errors, errors may still occur which can change documentation meaning.    Desiree Darling, PA-C 07/16/20 1607    Daleen Bo, MD 07/16/20 [phone]

## 2020-07-16 NOTE — ED Provider Notes (Signed)
[facility] EMERGENCY DEPARTMENT Provider Note   CSN: [id number] Arrival date & time: 07/16/20  1414     History Chief Complaint  Patient presents with   Weakness    Desiree [name] is a 75 y.o. female.   Weakness Severity:  Moderate Onset quality:  Gradual Timing:  Constant Progression:  Worsening Chronicity:  New Context: alcohol use, decreased sleep and dehydration   Context: not recent infection and not urinary tract infection   Relieved by:  None tried Worsened by:  Nothing Ineffective treatments:  None tried Associated symptoms: falls   Associated symptoms: no abdominal pain and no seizures       Past Medical History:  Diagnosis Date   Cat allergies    COPD (chronic obstructive pulmonary disease) ([place])    Hypertension    Osteoporosis    Pulmonary embolism ([place]) 2017   Rheumatoid arthritis(714.0)    Dr Tobie Lords follows    Patient Active Problem List   Diagnosis Date Noted   Mild CAD 02/02/2019   Abnormal nuclear stress test    CKD (chronic kidney disease) stage 3, GFR 30-59 ml/min ([place]) 01/04/2018   Senile purpura ([place]) 01/04/2018   At high risk for falls 07/14/2017   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[place]) 07/14/2017   GERD without esophagitis 07/14/2017   History of pulmonary embolism 07/14/2017   Malaise and fatigue 07/14/2017   Mild episode of recurrent major depressive disorder ([place]) 07/14/2017   Mixed hyperlipidemia 07/14/2017   Prediabetes 07/14/2017   Skin rash 07/14/2017   Vitamin B12 deficiency 07/14/2017   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   Multiple lung nodules on CT 05/16/2016   Osteoporosis 05/16/2016   Centrilobular emphysema ([place]) 05/16/2016   Allergic rhinitis 07/17/2015   Pulmonary embolism ([place]) 11/23/2014   Rheumatoid arthritis(714.0) 09/20/2010   Rheumatoid arthritis ([place]) 09/20/2010   COPD (chronic obstructive pulmonary disease) ([place]) 08/22/2010   Hypertension 08/22/2010     Past Surgical History:  Procedure Laterality Date   CYSTECTOMY Right    breast   LEFT HEART CATH AND CORONARY ANGIOGRAPHY N/A 01/05/2019   Procedure: LEFT HEART CATH AND CORONARY ANGIOGRAPHY;  Surgeon: Jettie Booze, MD;  Location: [facility];  Service: Cardiovascular;  Laterality: N/A;     OB History   No obstetric history on file.     Family History  Problem Relation Age of Onset   Heart disease Father    Stroke Mother    Clotting disorder Mother    Heart disease Sister    Sleep apnea Sister    Emphysema Sister    Breast cancer Maternal Aunt    Emphysema Paternal Grandmother     Social History   Tobacco Use   Smoking status: Former    Packs/day: 1.00    Years: 40.00    Pack years: 40.00    Types: Cigarettes    Quit date: 02/03/2001    Years since quitting: 19.4   Smokeless tobacco: Never  Vaping Use   Vaping Use: Never used  Substance Use Topics   Alcohol use: Yes    Alcohol/week: 2.0 standard drinks    Types: 2 Standard drinks or equivalent per week    Comment: 2 drinks daily   Drug use: No    Home Medications Prior to Admission medications   Medication Sig Start Date End Date Taking? Authorizing Provider  acetaminophen (TYLENOL) 500 MG tablet Take 500 mg by mouth every 6 (six) hours as needed  for moderate pain or headache.   Yes [provider]  albuterol (VENTOLIN HFA) 108 (90 Base) MCG/ACT inhaler Inhale 2 puffs into the lungs every 4 (four) hours as needed for wheezing or shortness of breath.   Yes [provider]  amLODipine (NORVASC) 5 MG tablet Take 1 tablet (5 mg total) by mouth daily. 03/19/20  Yes Tobb, Kardie, DO  apixaban (ELIQUIS) 2.5 MG TABS tablet Take 1 tablet (2.5 mg total) by mouth 2 (two) times daily. 02/10/20  Yes Tobb, Kardie, DO  atorvastatin (LIPITOR) 20 MG tablet Take 1 tablet (20 mg total) by mouth daily. 06/22/20  Yes Tobb, Kardie, DO  Budeson-Glycopyrrol-Formoterol (BREZTRI AEROSPHERE) 160-9-4.8  MCG/ACT AERO Inhale 2 puffs into the lungs 2 (two) times daily. 01/19/20  Yes Icard, Octavio Graves, DO  carvedilol (COREG) 3.125 MG tablet Take 1 tablet (3.125 mg total) by mouth 2 (two) times daily with a meal. 05/28/20  Yes Tobb, Kardie, DO  cholecalciferol (VITAMIN D) 25 MCG (1000 UT) tablet Take 1,000 Units by mouth daily.    Yes [provider]  ferrous sulfate 325 (65 FE) MG EC tablet Take 325 mg by mouth daily with breakfast.   Yes [provider]  fexofenadine (ALLEGRA) 180 MG tablet Take 180 mg by mouth daily.   Yes [provider]  hydroxychloroquine (PLAQUENIL) 200 MG tablet Take 200 mg by mouth 2 (two) times daily.  09/17/15  Yes [provider]  leflunomide (ARAVA) 20 MG tablet Take 20 mg by mouth daily.   Yes [provider]  meclizine (ANTIVERT) 25 MG tablet Take 25 mg by mouth 3 (three) times daily as needed. 07/11/20 07/21/20 Yes [provider]  montelukast (SINGULAIR) 10 MG tablet Take 1 tablet (10 mg total) by mouth at bedtime. Patient taking differently: Take 10 mg by mouth daily. 05/08/20  Yes Icard, Octavio Graves, DO  omeprazole (PRILOSEC) 20 MG capsule Take 1 capsule (20 mg total) by mouth daily. 07/11/20  Yes Icard, Octavio Graves, DO  predniSONE (DELTASONE) 5 MG tablet Take 5 mg by mouth daily with breakfast.   Yes [provider]  sertraline (ZOLOFT) 100 MG tablet Take 100 mg by mouth daily.  07/27/10  Yes [provider]  valsartan (DIOVAN) 320 MG tablet Take 320 mg by mouth daily.   Yes [provider]  vitamin B-12 (CYANOCOBALAMIN) 1000 MCG tablet Take 1,000 mcg by mouth daily.    Yes [provider]  budesonide-formoterol (SYMBICORT) 160-4.5 MCG/ACT inhaler Inhale 2 puffs into the lungs every 12 (twelve) hours. Patient not taking: Reported on 07/17/2020 12/01/19   Parrett, Fonnie Mu, NP  Tiotropium Bromide Monohydrate (SPIRIVA RESPIMAT) 2.5 MCG/ACT AERS Inhale 2 puffs into the lungs daily. Patient not  taking: Reported on 07/17/2020 11/28/19   Lauraine Rinne, NP    Allergies    Other  Review of Systems   Review of Systems  Gastrointestinal:  Negative for abdominal pain.  Musculoskeletal:  Positive for falls.  Neurological:  Positive for weakness. Negative for seizures.  All other systems reviewed and are negative.  Physical Exam Updated Vital Signs BP (!) 109/52   Pulse 88   Temp 99.1 F (37.3 C) (Oral)   Resp 17   SpO2 99%   Physical Exam Vitals and nursing note reviewed.  Constitutional:      Appearance: She is well-developed.  HENT:     Head: Normocephalic and atraumatic.     Mouth/Throat:     Mouth: Mucous membranes are moist.  Pharynx: Oropharynx is clear.  Eyes:     Pupils: Pupils are equal, round, and reactive to light.  Cardiovascular:     Rate and Rhythm: Normal rate and regular rhythm.  Pulmonary:     Effort: No respiratory distress.     Breath sounds: No stridor.  Abdominal:     General: Abdomen is flat. There is no distension.  Musculoskeletal:        General: No swelling or tenderness. Normal range of motion.     Cervical back: Normal range of motion.  Skin:    General: Skin is warm and dry.  Neurological:     General: No focal deficit present.     Mental Status: She is alert.    ED Results / Procedures / Treatments   Labs (all labs ordered are listed, but only abnormal results are displayed) Labs Reviewed  COMPREHENSIVE METABOLIC PANEL - Abnormal; Notable for the following components:      Result Value   Sodium 130 (*)    Chloride 96 (*)    Glucose, Bld 112 (*)    Total Protein 5.9 (*)    Albumin 3.0 (*)    All other components within normal limits  CBC WITH DIFFERENTIAL/PLATELET - Abnormal; Notable for the following components:   WBC 12.7 (*)    RBC 2.71 (*)    Hemoglobin 8.7 (*)    HCT 28.4 (*)    MCV 104.8 (*)    Neutro Abs 11.2 (*)    Lymphs Abs 0.4 (*)    All other components within normal limits  BRAIN NATRIURETIC PEPTIDE -  Abnormal; Notable for the following components:   B Natriuretic Peptide 115.9 (*)    All other components within normal limits  LIPASE, BLOOD - Abnormal; Notable for the following components:   Lipase 67 (*)    All other components within normal limits  URINALYSIS, ROUTINE W REFLEX MICROSCOPIC - Abnormal; Notable for the following components:   Protein, ur 30 (*)    All other components within normal limits  MAGNESIUM - Abnormal; Notable for the following components:   Magnesium 1.6 (*)    All other components within normal limits  TROPONIN I (HIGH SENSITIVITY) - Abnormal; Notable for the following components:   Troponin I (High Sensitivity) 32 (*)    All other components within normal limits  TROPONIN I (HIGH SENSITIVITY) - Abnormal; Notable for the following components:   Troponin I (High Sensitivity) 25 (*)    All other components within normal limits  URINE CULTURE    EKG EKG Interpretation  Date/Time:  Monday July 16 2020 15:45:50 EDT Ventricular Rate:  91 PR Interval:  150 QRS Duration: 90 QT Interval:  358 QTC Calculation: 440 R Axis:   24 Text Interpretation: Sinus rhythm with Premature supraventricular complexes and Premature ventricular complexes or Fusion complexes Low voltage QRS Cannot rule out Anterior infarct , age undetermined Abnormal ECG Artifact since last tracing no significant change Confirmed by Daleen Bo [phone]) on 07/16/2020 6:08:12 PM  Radiology CT Head Wo Contrast  Result Date: 07/16/2020 CLINICAL DATA:  Delirium, dizziness, fall EXAM: CT HEAD WITHOUT CONTRAST TECHNIQUE: Contiguous axial images were obtained from the base of the skull through the vertex without intravenous contrast. COMPARISON:  None. FINDINGS: Brain: No evidence of acute infarction, hemorrhage, hydrocephalus, extra-axial collection or mass lesion/mass effect. Extensive bilateral periventricular and deep white matter hypodensity. Vascular: No hyperdense vessel or unexpected  calcification. Skull: Normal. Negative for fracture or focal lesion. Sinuses/Orbits: No acute  finding. Other: None. IMPRESSION: No acute intracranial pathology. Small-vessel white matter disease. Electronically Signed   By: Eddie Candle M.D.   On: 07/16/2020 17:14   DG Chest Portable 1 View  Result Date: 07/16/2020 CLINICAL DATA:  Shortness of breath EXAM: PORTABLE CHEST 1 VIEW COMPARISON:  04/15/2019 FINDINGS: Heart and mediastinal contours are within normal limits. No focal opacities or effusions. No acute bony abnormality. IMPRESSION: No active disease. Electronically Signed   By: Rolm Baptise M.D.   On: 07/16/2020 22:32    Procedures Procedures   Medications Ordered in ED Medications  lactated ringers bolus 1,000 mL (0 mLs Intravenous Stopped 07/17/20 0629)  magnesium sulfate IVPB 2 g 50 mL (0 g Intravenous Stopped 07/17/20 0236)  lactated ringers bolus 1,000 mL (0 mLs Intravenous Stopped 07/17/20 0628)  albuterol (VENTOLIN HFA) 108 (90 Base) MCG/ACT inhaler 4 puff (4 puffs Inhalation Given 07/17/20 0654)  AeroChamber Plus Flo-Vu Large MISC 1 each (1 each Other Given 07/17/20 3838)    ED Course  I have reviewed the triage vital signs and the nursing notes.  Pertinent labs & imaging results that were available during my care of the patient were reviewed by me and considered in my medical decision making (see chart for details).    MDM Rules/Calculators/A&P                          No focal weakness to suggest stroke.  She does have generalized weakness.  I suspect is multifactorial.  She has COPD and is on oxygen and thus does not get up get around so that she is partially deconditioned.  She drinks a bottle of wine a day which contributes.  She is slightly anemic because of GI bleed headache after hours, from.  She also is dehydrated because she does not eat but a few bites of each meal.  She is also malnourished which is apparent by her low albumin and protein.  Gave her fluids and  breathing treatments here she seemed to improve quite a bit with that.  Her work-up was otherwise negative.  Discussed with the family and the patient that she may need to go to rehab or have some in-home PT.  They thought this was a good idea so we will consult transition of care to try to get that started   Final Clinical Impression(s) / ED Diagnoses Final diagnoses:  Weakness  Dehydration  Moderate protein-calorie malnutrition [facility])    Rx / DC Orders ED Discharge Orders          [place]        07/16/20 2353    Face-to-face encounter (required for Medicare/Medicaid patients)       Comments: I Merrily Pew certify that this patient is under my care and that I, or a nurse practitioner or physician's assistant working with me, had a face-to-face encounter that meets the physician face-to-face encounter requirements with this patient on 07/16/2020. The encounter with the patient was in whole, or in part for the following medical condition(s) which is the primary reason for home health care (List medical condition): weakness, anemia, COPD. Further orders per PCP.   07/16/20 2353             Lilo Wallington, Corene Cornea, MD 07/18/20 0300

## 2020-07-16 NOTE — ED Notes (Signed)
Replaced oxygen tank

## 2020-07-17 MED ORDER — ALBUTEROL SULFATE HFA 108 (90 BASE) MCG/ACT IN AERS
4.0000 | INHALATION_SPRAY | Freq: Once | RESPIRATORY_TRACT | Status: AC
  Administered 2020-07-17: 4 via RESPIRATORY_TRACT
  Filled 2020-07-17: qty 6.7

## 2020-07-17 MED ORDER — LACTATED RINGERS IV BOLUS
1000.0000 mL | Freq: Once | INTRAVENOUS | Status: AC
  Administered 2020-07-17: 1000 mL via INTRAVENOUS

## 2020-07-17 MED ORDER — AEROCHAMBER PLUS FLO-VU LARGE MISC
1.0000 | Freq: Once | Status: AC
  Administered 2020-07-17: 1

## 2020-07-17 MED ORDER — AEROCHAMBER PLUS FLO-VU LARGE MISC
Status: AC
  Filled 2020-07-17: qty 1

## 2020-07-17 NOTE — Discharge Instructions (Addendum)
Please decrease your alcohol intake and increase your protein and calorie intake via methods I discussed with you.   Please follow up your doctor for recheck.   Social work or case management will contact you regarding PT and other options.

## 2020-07-17 NOTE — ED Notes (Signed)
Pt complained of increased dizziness with each position change during orthostatic vital signs.

## 2020-07-17 NOTE — ED Notes (Signed)
All appropriate discharge materials reviewed at length with patient. Time for questions provided. Pt has no other questions at this time and verbalizes understanding of all provided materials.  

## 2020-07-17 NOTE — ED Notes (Addendum)
Pt ambulated to bathroom using walker and staff supervision, complained of some dizziness upon sitting up and standing up. Pt assisted back to bed. RN made aware.

## 2020-07-18 ENCOUNTER — Telehealth: Payer: Self-pay | Admitting: *Deleted

## 2020-07-18 LAB — URINE CULTURE

## 2020-07-18 NOTE — Telephone Encounter (Signed)
Pt daughter, Alyse Low called regarding home health services for mom.  RNCM referred pt to [facility] and spoke with Lanetta Inch, RN to confirm receipt of referral.  Lanetta Inch will contact pt/daughter prior to first visit.

## 2020-07-18 NOTE — Telephone Encounter (Signed)
Pt daughter called to follow-up on [facility] agency.  RNCM contacted Stacie of [facility] for referral.

## 2020-07-18 NOTE — Telephone Encounter (Signed)
Daughter Alyse Low) called back for [employer] phone number.  RNCM gave Alyse Low information as requested.

## 2020-07-23 ENCOUNTER — Other Ambulatory Visit: Payer: Self-pay

## 2020-07-23 MED ORDER — APIXABAN 2.5 MG PO TABS: 2.5000 mg | ORAL_TABLET | Freq: Two times a day (BID) | ORAL | 5 refills | Status: DC

## 2020-07-23 NOTE — Telephone Encounter (Signed)
Prescription refill request for Eliquis received. Indication:Atrial fib Last office visit:7/21 Scr:0.7 Age: 75 Weight:61.2 kg  Prescription refilled

## 2020-07-23 NOTE — Telephone Encounter (Signed)
Sent to [facility] clinic.

## 2020-07-25 ENCOUNTER — Emergency Department (HOSPITAL_COMMUNITY): Payer: Medicare PPO

## 2020-07-25 ENCOUNTER — Encounter (HOSPITAL_COMMUNITY): Payer: Self-pay | Admitting: *Deleted

## 2020-07-25 ENCOUNTER — Inpatient Hospital Stay (HOSPITAL_COMMUNITY)
Admission: EM | Admit: 2020-07-25 | Discharge: 2020-08-05 | DRG: 330 | Disposition: A | Discharge status: Skilled Nursing Facility | Payer: Medicare PPO | Source: Ambulatory Visit | Attending: Family Medicine | Admitting: Family Medicine

## 2020-07-25 ENCOUNTER — Other Ambulatory Visit: Payer: Self-pay

## 2020-07-25 DIAGNOSIS — C772 Secondary and unspecified malignant neoplasm of intra-abdominal lymph nodes: Secondary | ICD-10-CM | POA: Diagnosis present

## 2020-07-25 DIAGNOSIS — D381 Neoplasm of uncertain behavior of trachea, bronchus and lung: Secondary | ICD-10-CM | POA: Diagnosis present

## 2020-07-25 DIAGNOSIS — K922 Gastrointestinal hemorrhage, unspecified: Secondary | ICD-10-CM

## 2020-07-25 DIAGNOSIS — Z86711 Personal history of pulmonary embolism: Secondary | ICD-10-CM | POA: Diagnosis present

## 2020-07-25 DIAGNOSIS — R1110 Vomiting, unspecified: Secondary | ICD-10-CM

## 2020-07-25 DIAGNOSIS — N183 Chronic kidney disease, stage 3 unspecified: Secondary | ICD-10-CM | POA: Diagnosis present

## 2020-07-25 DIAGNOSIS — D649 Anemia, unspecified: Secondary | ICD-10-CM | POA: Diagnosis present

## 2020-07-25 DIAGNOSIS — Z8249 Family history of ischemic heart disease and other diseases of the circulatory system: Secondary | ICD-10-CM

## 2020-07-25 DIAGNOSIS — E538 Deficiency of other specified B group vitamins: Secondary | ICD-10-CM | POA: Diagnosis present

## 2020-07-25 DIAGNOSIS — E871 Hypo-osmolality and hyponatremia: Secondary | ICD-10-CM | POA: Diagnosis present

## 2020-07-25 DIAGNOSIS — Z823 Family history of stroke: Secondary | ICD-10-CM

## 2020-07-25 DIAGNOSIS — Z803 Family history of malignant neoplasm of breast: Secondary | ICD-10-CM

## 2020-07-25 DIAGNOSIS — Z9981 Dependence on supplemental oxygen: Secondary | ICD-10-CM

## 2020-07-25 DIAGNOSIS — K829 Disease of gallbladder, unspecified: Secondary | ICD-10-CM | POA: Diagnosis present

## 2020-07-25 DIAGNOSIS — K824 Cholesterolosis of gallbladder: Secondary | ICD-10-CM

## 2020-07-25 DIAGNOSIS — E876 Hypokalemia: Secondary | ICD-10-CM | POA: Diagnosis not present

## 2020-07-25 DIAGNOSIS — R195 Other fecal abnormalities: Secondary | ICD-10-CM | POA: Diagnosis not present

## 2020-07-25 DIAGNOSIS — Z9889 Other specified postprocedural states: Secondary | ICD-10-CM

## 2020-07-25 DIAGNOSIS — I480 Paroxysmal atrial fibrillation: Secondary | ICD-10-CM | POA: Diagnosis present

## 2020-07-25 DIAGNOSIS — K222 Esophageal obstruction: Secondary | ICD-10-CM | POA: Diagnosis present

## 2020-07-25 DIAGNOSIS — C182 Malignant neoplasm of ascending colon: Principal | ICD-10-CM | POA: Diagnosis present

## 2020-07-25 DIAGNOSIS — D500 Iron deficiency anemia secondary to blood loss (chronic): Secondary | ICD-10-CM | POA: Diagnosis present

## 2020-07-25 DIAGNOSIS — Z87891 Personal history of nicotine dependence: Secondary | ICD-10-CM

## 2020-07-25 DIAGNOSIS — Z66 Do not resuscitate: Secondary | ICD-10-CM | POA: Diagnosis present

## 2020-07-25 DIAGNOSIS — Z20822 Contact with and (suspected) exposure to covid-19: Secondary | ICD-10-CM | POA: Diagnosis present

## 2020-07-25 DIAGNOSIS — J449 Chronic obstructive pulmonary disease, unspecified: Secondary | ICD-10-CM | POA: Diagnosis present

## 2020-07-25 DIAGNOSIS — R710 Precipitous drop in hematocrit: Secondary | ICD-10-CM

## 2020-07-25 DIAGNOSIS — Z825 Family history of asthma and other chronic lower respiratory diseases: Secondary | ICD-10-CM

## 2020-07-25 DIAGNOSIS — J432 Centrilobular emphysema: Secondary | ICD-10-CM | POA: Diagnosis present

## 2020-07-25 DIAGNOSIS — K219 Gastro-esophageal reflux disease without esophagitis: Secondary | ICD-10-CM | POA: Diagnosis present

## 2020-07-25 DIAGNOSIS — J90 Pleural effusion, not elsewhere classified: Secondary | ICD-10-CM | POA: Diagnosis present

## 2020-07-25 DIAGNOSIS — K5730 Diverticulosis of large intestine without perforation or abscess without bleeding: Secondary | ICD-10-CM | POA: Diagnosis present

## 2020-07-25 DIAGNOSIS — Z7901 Long term (current) use of anticoagulants: Secondary | ICD-10-CM

## 2020-07-25 DIAGNOSIS — K7030 Alcoholic cirrhosis of liver without ascites: Secondary | ICD-10-CM | POA: Diagnosis present

## 2020-07-25 DIAGNOSIS — D122 Benign neoplasm of ascending colon: Secondary | ICD-10-CM | POA: Diagnosis present

## 2020-07-25 DIAGNOSIS — M24411 Recurrent dislocation, right shoulder: Secondary | ICD-10-CM | POA: Diagnosis present

## 2020-07-25 DIAGNOSIS — R296 Repeated falls: Secondary | ICD-10-CM | POA: Diagnosis present

## 2020-07-25 DIAGNOSIS — F1010 Alcohol abuse, uncomplicated: Secondary | ICD-10-CM | POA: Diagnosis present

## 2020-07-25 DIAGNOSIS — Z7951 Long term (current) use of inhaled steroids: Secondary | ICD-10-CM

## 2020-07-25 DIAGNOSIS — Z79899 Other long term (current) drug therapy: Secondary | ICD-10-CM

## 2020-07-25 DIAGNOSIS — M069 Rheumatoid arthritis, unspecified: Secondary | ICD-10-CM | POA: Diagnosis present

## 2020-07-25 DIAGNOSIS — J309 Allergic rhinitis, unspecified: Secondary | ICD-10-CM | POA: Diagnosis present

## 2020-07-25 DIAGNOSIS — Z832 Family history of diseases of the blood and blood-forming organs and certain disorders involving the immune mechanism: Secondary | ICD-10-CM

## 2020-07-25 DIAGNOSIS — I129 Hypertensive chronic kidney disease with stage 1 through stage 4 chronic kidney disease, or unspecified chronic kidney disease: Secondary | ICD-10-CM | POA: Diagnosis present

## 2020-07-25 HISTORY — DX: Anemia, unspecified: D64.9

## 2020-07-25 HISTORY — DX: Paroxysmal atrial fibrillation: I48.0

## 2020-07-25 HISTORY — DX: Malignant neoplasm of ascending colon: C18.2

## 2020-07-25 LAB — RETICULOCYTES
Immature Retic Fract: 17.4 % — ABNORMAL HIGH (ref 2.3–15.9)
RBC.: 2.47 MIL/uL — ABNORMAL LOW (ref 3.87–5.11)
Retic Count, Absolute: 77.1 10*3/uL (ref 19.0–186.0)
Retic Ct Pct: 3.1 % (ref 0.4–3.1)

## 2020-07-25 LAB — CBC
HCT: 25.8 % — ABNORMAL LOW (ref 36.0–46.0)
Hemoglobin: 8.1 g/dL — ABNORMAL LOW (ref 12.0–15.0)
MCH: 32.7 pg (ref 26.0–34.0)
MCHC: 31.4 g/dL (ref 30.0–36.0)
MCV: 104 fL — ABNORMAL HIGH (ref 80.0–100.0)
Platelets: 337 10*3/uL (ref 150–400)
RBC: 2.48 MIL/uL — ABNORMAL LOW (ref 3.87–5.11)
RDW: 13.8 % (ref 11.5–15.5)
WBC: 10 10*3/uL (ref 4.0–10.5)
nRBC: 0 % (ref 0.0–0.2)

## 2020-07-25 LAB — COMPREHENSIVE METABOLIC PANEL
ALT: 21 U/L (ref 0–44)
AST: 29 U/L (ref 15–41)
Albumin: 2.9 g/dL — ABNORMAL LOW (ref 3.5–5.0)
Alkaline Phosphatase: 77 U/L (ref 38–126)
Anion gap: 9 (ref 5–15)
BUN: 7 mg/dL — ABNORMAL LOW (ref 8–23)
CO2: 30 mmol/L (ref 22–32)
Calcium: 8.7 mg/dL — ABNORMAL LOW (ref 8.9–10.3)
Chloride: 93 mmol/L — ABNORMAL LOW (ref 98–111)
Creatinine, Ser: 0.75 mg/dL (ref 0.44–1.00)
GFR, Estimated: >60 mL/min (ref >60)
Glucose, Bld: 90 mg/dL (ref 70–99)
Potassium: 3.9 mmol/L (ref 3.5–5.1)
Sodium: 132 mmol/L — ABNORMAL LOW (ref 135–145)
Total Bilirubin: 0.4 mg/dL (ref 0.3–1.2)
Total Protein: 5.8 g/dL — ABNORMAL LOW (ref 6.5–8.1)

## 2020-07-25 LAB — FOLATE: Folate: 10.5 ng/mL (ref >5.9)

## 2020-07-25 LAB — IRON AND TIBC
Iron: 36 ug/dL (ref 28–170)
Saturation Ratios: 14 % (ref 10.4–31.8)
TIBC: 266 ug/dL (ref 250–450)
UIBC: 230 ug/dL

## 2020-07-25 LAB — RESP PANEL BY RT-PCR (FLU A&B, COVID) ARPGX2
Influenza A by PCR: NEGATIVE
Influenza B by PCR: NEGATIVE
SARS Coronavirus 2 by RT PCR: NEGATIVE

## 2020-07-25 LAB — PROTIME-INR
INR: 1 (ref 0.8–1.2)
Prothrombin Time: 13.4 seconds (ref 11.4–15.2)

## 2020-07-25 LAB — VITAMIN B12: Vitamin B-12: 1399 pg/mL — ABNORMAL HIGH (ref 180–914)

## 2020-07-25 LAB — HEMOGLOBIN AND HEMATOCRIT, BLOOD
HCT: 25.8 % — ABNORMAL LOW (ref 36.0–46.0)
Hemoglobin: 8 g/dL — ABNORMAL LOW (ref 12.0–15.0)

## 2020-07-25 LAB — LIPASE, BLOOD: Lipase: 85 U/L — ABNORMAL HIGH (ref 11–51)

## 2020-07-25 LAB — ABO/RH: ABO/RH(D): O POS

## 2020-07-25 LAB — PREPARE RBC (CROSSMATCH)

## 2020-07-25 LAB — POC OCCULT BLOOD, ED: Fecal Occult Bld: POSITIVE — AB

## 2020-07-25 LAB — FERRITIN: Ferritin: 64 ng/mL (ref 11–307)

## 2020-07-25 MED ORDER — MONTELUKAST SODIUM 10 MG PO TABS
10.0000 mg | ORAL_TABLET | Freq: Every day | ORAL | Status: DC
  Administered 2020-07-26 – 2020-08-04 (×10): 10 mg via ORAL
  Filled 2020-07-25 (×11): qty 1

## 2020-07-25 MED ORDER — SODIUM CHLORIDE 0.9 % IV SOLN: INTRAVENOUS | Status: DC

## 2020-07-25 MED ORDER — LEFLUNOMIDE 20 MG PO TABS
20.0000 mg | ORAL_TABLET | Freq: Every day | ORAL | Status: DC
  Administered 2020-07-26 – 2020-08-05 (×10): 20 mg via ORAL
  Filled 2020-07-25 (×11): qty 1

## 2020-07-25 MED ORDER — HYDROXYCHLOROQUINE SULFATE 200 MG PO TABS
200.0000 mg | ORAL_TABLET | Freq: Two times a day (BID) | ORAL | Status: DC
  Administered 2020-07-26 – 2020-08-05 (×20): 200 mg via ORAL
  Filled 2020-07-25 (×21): qty 1

## 2020-07-25 MED ORDER — LORATADINE 10 MG PO TABS
10.0000 mg | ORAL_TABLET | Freq: Every day | ORAL | Status: DC
  Administered 2020-07-26 – 2020-08-05 (×10): 10 mg via ORAL
  Filled 2020-07-25 (×10): qty 1

## 2020-07-25 MED ORDER — AMLODIPINE BESYLATE 5 MG PO TABS
5.0000 mg | ORAL_TABLET | Freq: Every day | ORAL | Status: DC
  Administered 2020-07-26 – 2020-08-05 (×10): 5 mg via ORAL
  Filled 2020-07-25 (×10): qty 1

## 2020-07-25 MED ORDER — ATORVASTATIN CALCIUM 10 MG PO TABS
20.0000 mg | ORAL_TABLET | Freq: Every day | ORAL | Status: DC
  Administered 2020-07-26 – 2020-08-05 (×10): 20 mg via ORAL
  Filled 2020-07-25 (×10): qty 2

## 2020-07-25 MED ORDER — UMECLIDINIUM BROMIDE 62.5 MCG/INH IN AEPB
1.0000 | INHALATION_SPRAY | Freq: Every day | RESPIRATORY_TRACT | Status: DC
  Administered 2020-07-26 – 2020-08-05 (×8): 1 via RESPIRATORY_TRACT
  Filled 2020-07-25 (×2): qty 7

## 2020-07-25 MED ORDER — CARVEDILOL 3.125 MG PO TABS
3.1250 mg | ORAL_TABLET | Freq: Two times a day (BID) | ORAL | Status: DC
  Administered 2020-07-25 – 2020-08-05 (×21): 3.125 mg via ORAL
  Filled 2020-07-25 (×21): qty 1

## 2020-07-25 MED ORDER — ALBUTEROL SULFATE HFA 108 (90 BASE) MCG/ACT IN AERS: 2.0000 | INHALATION_SPRAY | RESPIRATORY_TRACT | Status: DC | PRN

## 2020-07-25 MED ORDER — IRBESARTAN 300 MG PO TABS
300.0000 mg | ORAL_TABLET | Freq: Every day | ORAL | Status: DC
  Administered 2020-07-26 – 2020-07-27 (×2): 300 mg via ORAL
  Filled 2020-07-25: qty 1

## 2020-07-25 MED ORDER — PREDNISONE 5 MG PO TABS
5.0000 mg | ORAL_TABLET | Freq: Every day | ORAL | Status: DC
  Administered 2020-07-26 – 2020-08-05 (×10): 5 mg via ORAL
  Filled 2020-07-25 (×11): qty 1

## 2020-07-25 MED ORDER — PANTOPRAZOLE SODIUM 40 MG IV SOLR
40.0000 mg | Freq: Two times a day (BID) | INTRAVENOUS | Status: DC
  Administered 2020-07-26 – 2020-08-02 (×15): 40 mg via INTRAVENOUS
  Filled 2020-07-25 (×15): qty 40

## 2020-07-25 MED ORDER — SODIUM CHLORIDE 0.9 % IV SOLN
10.0000 mL/h | Freq: Once | INTRAVENOUS | Status: AC
  Administered 2020-07-26: 10 mL/h via INTRAVENOUS

## 2020-07-25 MED ORDER — BUDESON-GLYCOPYRROL-FORMOTEROL 160-9-4.8 MCG/ACT IN AERO: 2.0000 | INHALATION_SPRAY | Freq: Two times a day (BID) | RESPIRATORY_TRACT | Status: DC

## 2020-07-25 MED ORDER — ARFORMOTEROL TARTRATE 15 MCG/2ML IN NEBU
15.0000 ug | INHALATION_SOLUTION | Freq: Two times a day (BID) | RESPIRATORY_TRACT | Status: DC
  Administered 2020-07-26 – 2020-08-05 (×19): 15 ug via RESPIRATORY_TRACT
  Filled 2020-07-25 (×22): qty 2

## 2020-07-25 MED ORDER — SERTRALINE HCL 100 MG PO TABS
100.0000 mg | ORAL_TABLET | Freq: Every day | ORAL | Status: DC
  Administered 2020-07-26 – 2020-08-05 (×10): 100 mg via ORAL
  Filled 2020-07-25 (×10): qty 1

## 2020-07-25 MED ORDER — ALBUTEROL SULFATE (2.5 MG/3ML) 0.083% IN NEBU
2.5000 mg | INHALATION_SOLUTION | RESPIRATORY_TRACT | Status: DC | PRN
  Administered 2020-07-26 – 2020-07-30 (×2): 2.5 mg via RESPIRATORY_TRACT
  Filled 2020-07-25 (×2): qty 3

## 2020-07-25 NOTE — ED Notes (Signed)
On assessment, lungs are clear to auscultation. PRBC transfusion continues at 175 ml/hr via pump. Patient denies chills, back pain, or SOB. Skin is warm and dry. Patient is A/O. Cardiac monitor in place and VS are monitored. Lights off at patient's request. SR up X 2 for safety.

## 2020-07-25 NOTE — H&P (Addendum)
[facility] Admission History and Physical Service Pager: [phone]  Patient name: Desiree [name] Medical record number: [id number] Date of birth: April 30, 1945 Age: 75 y.o. Gender: female  Primary Care Provider: Raina Mina., MD Consultants: GI Code Status: DNR  Preferred Emergency Contact: [name] (Daughter) [phone]  Chief Complaint: Fatigue  Assessment and Plan: Desiree [name] is a 75 y.o. female presenting with symptomatic anemia and concern for GI bleed . 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  Symptomatic Anemia  GI Bleed  Melena Patient presented with concern from PCP about need for possible blood transfusion due to melena with symptomatic anemia. Upper endoscopy completed 1 year ago without significant findings.  Patient's daughter reports that anemia symptoms were worse previous ED visit 06/13.  Patient still having fatigue/tiredness and dizziness when standing, melanotic diarrhea. In the ED, CBC 8.2 (baseline around 10) and patient symptomatic. Due to symptoms and physical exam ED provider ordered 1 unit PRBC to be transfused.  GI was consulted with recommendation for observation and further work-up. Currently on omeprazole at home, but will place on IV Protonix while hospitalized with concern for slow GI bleed that will need endoscopic evaluation. - Admit to FPTS, attending Dr. Erin Hearing - GI consulted, appreciate reccomendations - Cardiac telemetry - IV Protonix BID - Vitals per routine - 2 g sodium diet - Follow up FOBT - Orthostatic vitals - Ambulate with assistance - Fall precautions - IVF NS @100mL /h - Follow-up post-transfusion H&H - Monitor CBC - PT/OT eval and treat - Anemia panel  Alcohol use  Cirrhosis  Patient with history of alcohol use, LFTs stable and within normal limits.  Patient currently drinking about 2 glasses of wine /day, can  drink 1 bottle of wine per day though she has not in the last 2 weeks.  Patient states she does not require drinking alcohol daily and that she has previously gone 3 days without drinking without an issue several months ago.  Do not think that patient will go into withdrawal so will not order Ativan at this time but will monitor CIWA scores. - CIWAs protocol without Ativan  Hyponatremia Sodium mildly low at 132, likely due to chronic alcohol use. - Continue monitor with BMP  Atrial fibrillation  Hx of pulmonary embolism Chronic, stable.  On physical exam, patient NSR. Home medications include: Eliquis 5mg  BID, carvedilol 3.125 mg twice daily - Hold home Eliquis in the setting of concern for GI bleed - Continue home carvedilol 3.125 mg twice daily - Continuous cardiac monitoring - Follow-up EKG  COPD Baseline 2L O2. CXR showed slightly low lung volume with possible trace pleural effusion and hazy atelectasis of the right base.  Continue home inhalers of as needed albuterol and Breztri. - Albuterol 2 puffs every 4 hours as needed - Continue home Breztri 2 puffs twice daily - Continuous pulse ox and pulse ox with vitals - Supplemental O2 as needed to maintain O2 saturations between 88-90%  HTN BP on admission normotensive, home medications include valsartan 300 mg daily, amlodipine 5 mg daily.  Valsartan is nonformulary, will substitute with irbesartan equivalent dose. - Continue home amlodipine and irbesartan  Allergic rhinitis Home medications include Allegra and montelukast.  Allegra not available on formulary, will substitute loratadine. - Continue loratadine and montelukast  Rheumatoid arthritis Stable, chronic.  Home medications include leflunomide 20 mg daily, hydroxychloroquine 200 mg twice daily, prednisone 5mg  with breakfast.  Hydroxychloroquine still  needs review on med rec and will resume if patient taking. - Continue home leflunomide daily and Prednisone  - Resume  hydroxychloroquine if patient taking per med rec   FEN/GI: 2 g sodium diet, IV Protonix twice daily Prophylaxis: SCDs  Disposition: Med-tele obs  History of Present Illness:  Desiree [name] is a 75 y.o. female presenting with fatigue and fall multiple times.   History provided by patient and daughter   Patient was seen in the ED on 6/13 patient was seen with generalized weakness found to be dehydrated and have malnourishment.  Patient was given breathing treatments and fluids with improvement and discharged home.  In her PCP office yesterday, physician was concerned about patient's symptoms and melanotic diarrhea.  FOBT in the office was performed and was positive.  Patient reports that she has been having black diarrhea for several months now, she is having about 2-3 episodes per day and has had some improvement.  She has been having issues with "not making it in time to the bathroom" and then having to clean herself up afterwards.  She does not have any issues with urination or incontinence.  Daughter reports that patient has had 4 falls in the last week and that the daughter has been physically present for to them.  He falls are typically with the patient hitting her bottom and that several of them have been "easier landings".  Patient does note that she hit her head on the shower edge 2 days ago but has not had any headache or vision changes or nausea.  Patient has increased her diet since the malnutrition diagnosis but still eats very little.  She has previously seen GI and had an endoscopy done 1 year ago.  She is currently on omeprazole outpatient.  Patient reports that she drinks about 2 glasses of wine per day.  Has a history of drinking about a bottle of wine per day, but lately the family has not been allowing her to drink so she drinks when she is able to.  She has gone at least 3 days in the past without alcohol withdrawal symptoms.   Review Of Systems: Per HPI with the  following additions:   Review of Systems  Constitutional:  Positive for appetite change and fatigue. Negative for chills.  HENT:  Positive for congestion.   Eyes:  Negative for visual disturbance.  Respiratory:  Positive for cough. Negative for chest tightness and shortness of breath.   Cardiovascular:  Positive for leg swelling. Negative for chest pain and palpitations.  Gastrointestinal:  Positive for blood in stool and diarrhea. Negative for abdominal pain and vomiting.  Genitourinary:  Negative for difficulty urinating and urgency.  Neurological:  Positive for dizziness. Negative for tremors, syncope, weakness, light-headedness and headaches.    Patient Active Problem List   Diagnosis Date Noted   Mild CAD 02/02/2019   Abnormal nuclear stress test    CKD (chronic kidney disease) stage 3, GFR 30-59 ml/min (HCC) 01/04/2018   Senile purpura ([place]) 01/04/2018   At high risk for falls 07/14/2017   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[place]) 07/14/2017   GERD without esophagitis 07/14/2017   History of pulmonary embolism 07/14/2017   Malaise and fatigue 07/14/2017   Mild episode of recurrent major depressive disorder ([place]) 07/14/2017   Mixed hyperlipidemia 07/14/2017   Prediabetes 07/14/2017   Skin rash 07/14/2017   Vitamin B12 deficiency 07/14/2017   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   Multiple lung nodules on  CT 05/16/2016   Osteoporosis 05/16/2016   Centrilobular emphysema ([place]) 05/16/2016   Allergic rhinitis 07/17/2015   Pulmonary embolism ([place]) 11/23/2014   Rheumatoid arthritis(714.0) 09/20/2010   Rheumatoid arthritis ([place]) 09/20/2010   COPD (chronic obstructive pulmonary disease) ([place]) 08/22/2010   Hypertension 08/22/2010    Past Medical History: Past Medical History:  Diagnosis Date   Cat allergies    COPD (chronic obstructive pulmonary disease) ([place])    Hypertension    Osteoporosis    Pulmonary embolism ([place]) 2017   Rheumatoid arthritis(714.0)     Dr Tobie Lords follows    Past Surgical History: Past Surgical History:  Procedure Laterality Date   CYSTECTOMY Right    breast   LEFT HEART CATH AND CORONARY ANGIOGRAPHY N/A 01/05/2019   Procedure: LEFT HEART CATH AND CORONARY ANGIOGRAPHY;  Surgeon: Jettie Booze, MD;  Location: [facility];  Service: Cardiovascular;  Laterality: N/A;    Social History: Social History   Tobacco Use   Smoking status: Former    Packs/day: 1.00    Years: 40.00    Pack years: 40.00    Types: Cigarettes    Quit date: 02/03/2001    Years since quitting: 19.4   Smokeless tobacco: Never  Vaping Use   Vaping Use: Never used  Substance Use Topics   Alcohol use: Yes    Alcohol/week: 2.0 standard drinks    Types: 2 Standard drinks or equivalent per week    Comment: 2 drinks daily   Drug use: No   Additional social history: Lives alone, able to do all ADLs, daughter lives in [place] and son lives in [place] Please also refer to relevant sections of EMR.  Family History: Family History  Problem Relation Age of Onset   Heart disease Father    Stroke Mother    Clotting disorder Mother    Heart disease Sister    Sleep apnea Sister    Emphysema Sister    Breast cancer Maternal Aunt    Emphysema Paternal Grandmother     Allergies and Medications: Allergies  Allergen Reactions   Other Shortness Of Breath    Cats   No current facility-administered medications on file prior to encounter.   Current Outpatient Medications on File Prior to Encounter  Medication Sig Dispense Refill   acetaminophen (TYLENOL) 500 MG tablet Take 500 mg by mouth every 6 (six) hours as needed for moderate pain or headache.     albuterol (VENTOLIN HFA) 108 (90 Base) MCG/ACT inhaler Inhale 2 puffs into the lungs every 4 (four) hours as needed for wheezing or shortness of breath.     amLODipine (NORVASC) 5 MG tablet Take 1 tablet (5 mg total) by mouth daily. 90 tablet 1   apixaban (ELIQUIS) 2.5 MG TABS tablet  Take 1 tablet (2.5 mg total) by mouth 2 (two) times daily. 60 tablet 5   atorvastatin (LIPITOR) 20 MG tablet Take 1 tablet (20 mg total) by mouth daily. 30 tablet 1   Budeson-Glycopyrrol-Formoterol (BREZTRI AEROSPHERE) 160-9-4.8 MCG/ACT AERO Inhale 2 puffs into the lungs 2 (two) times daily. 10.7 g 4   budesonide-formoterol (SYMBICORT) 160-4.5 MCG/ACT inhaler Inhale 2 puffs into the lungs every 12 (twelve) hours. (Patient not taking: Reported on 07/17/2020) 10.2 g 11   carvedilol (COREG) 3.125 MG tablet Take 1 tablet (3.125 mg total) by mouth 2 (two) times daily with a meal. 180 tablet 0   cholecalciferol (VITAMIN D) 25 MCG (1000 UT) tablet Take 1,000 Units by mouth daily.  ferrous sulfate 325 (65 FE) MG EC tablet Take 325 mg by mouth daily with breakfast.     fexofenadine (ALLEGRA) 180 MG tablet Take 180 mg by mouth daily.     hydroxychloroquine (PLAQUENIL) 200 MG tablet Take 200 mg by mouth 2 (two) times daily.   3   leflunomide (ARAVA) 20 MG tablet Take 20 mg by mouth daily.     montelukast (SINGULAIR) 10 MG tablet Take 1 tablet (10 mg total) by mouth at bedtime. (Patient taking differently: Take 10 mg by mouth daily.) 90 tablet 1   omeprazole (PRILOSEC) 20 MG capsule Take 1 capsule (20 mg total) by mouth daily. 90 capsule 1   predniSONE (DELTASONE) 5 MG tablet Take 5 mg by mouth daily with breakfast.     sertraline (ZOLOFT) 100 MG tablet Take 100 mg by mouth daily.      Tiotropium Bromide Monohydrate (SPIRIVA RESPIMAT) 2.5 MCG/ACT AERS Inhale 2 puffs into the lungs daily. (Patient not taking: Reported on 07/17/2020) 4 g 11   valsartan (DIOVAN) 320 MG tablet Take 320 mg by mouth daily.     vitamin B-12 (CYANOCOBALAMIN) 1000 MCG tablet Take 1,000 mcg by mouth daily.       Objective: BP 118/65   Pulse 90   Temp 98.1 F (36.7 C) (Oral)   Resp (!) 22   SpO2 99%  Exam: General -- oriented x3, pleasant and cooperative. HEENT -- Head is normocephalic. PERRLA. EOMI. Ears, nose and throat  were benign. Pale conjunctiva Neck -- supple; no bruits. Integument -- intact. No rash, erythema, or ecchymoses.  Chest -- Decreased breath sounds bilaterally Cardiac -- RRR. No murmurs noted. Cap refill 5sec Abdomen -- soft, nontender. No masses palpable. Bowel sounds present. Genital, rectal and breast exam -- deferred. CNS -- cranial nerves II through XII grossly intact. 2+ reflexes bilaterally. Extremities -non-pitting edema in ankles (chronic). ROM good. 5/5 bilateral strength. Dorsalis pedis pulses present and symmetrical.    Labs and Imaging: CBC BMET  Recent Labs  Lab 07/25/20 1018  WBC 10.0  HGB 8.1*  HCT 25.8*  PLT 337   Recent Labs  Lab 07/25/20 1018  NA 132*  K 3.9  CL 93*  CO2 30  BUN 7*  CREATININE 0.75  GLUCOSE 90  CALCIUM 8.7*     EKG: Sinus rhythm with Qtc 454. RSR' in V1 and V2, QRS 97.  DG Chest Port 1 View  Result Date: 07/25/2020 CLINICAL DATA:  Possible GI bleed EXAM: PORTABLE CHEST 1 VIEW COMPARISON:  07/16/2020, CT 11/22/2019 FINDINGS: Possible trace pleural effusions. Hazy atelectasis right base. No consolidation. Stable cardiomediastinal silhouette with aortic atherosclerosis. No pneumothorax. IMPRESSION: Slightly low lung volume with possible trace pleural effusion and hazy atelectasis right base Electronically Signed   By: Donavan Foil M.D.   On: 07/25/2020 15:47     Rise Patience, DO 07/25/2020, 3:59 PM PGY-1, [facility] Intern pager: [phone], text pages welcome  FPTS Upper-Level Resident Addendum   I have independently interviewed and examined the patient. I have discussed the above with the original author and agree with their documentation. My edits for correction/addition/clarification are within the document. Please see also any attending notes.   Gerlene Fee, DO PGY-2, [facility] 07/25/2020 7:14 PM  [facility] Service pager: [phone] (text pages welcome through [facility])

## 2020-07-25 NOTE — ED Triage Notes (Signed)
Pt reports going to pcp for fatigue and told to come here to due to anemia and possible blood transfusion.

## 2020-07-25 NOTE — Consult Note (Addendum)
Consultation  Referring Provider: ER MD/Zackowski Primary Care Physician:  Raina Mina., MD Primary Gastroenterologist:  Dr. Lyda Jester  Reason for Consultation: Weakness, progressive anemia, heme positive, dark stools  HPI: Desiree [name] is a 75 y.o. female, who was brought to the ER today after she was seen by her PCP and noted to have progressive anemia and heme positive stool. 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 She reports that she had endoscopic evaluation with Dr. Lyda Jester about a year and a half ago when she was having problems with anemia.  They were told that there were no significant findings. She has been on Eliquis over the past couple of years. She says over the past few weeks she has been feeling more weak and fatigued though not necessarily more short of breath.  She has no complaints of abdominal pain or discomfort, stools apparently stay on the loose side and are always dark.  She does take an oral iron supplement.  She has not noticed any frank blood.  No nausea or vomiting no heartburn or indigestion no dysphagia. No aspirin or NSAID use .She does continue to drink alcohol "at least 2 glasses,when I can get it "daily. She has not had any prior active GI bleeding.  Labs from 07/16/2020-hemoglobin 8.7/hematocrit 28.4/MCV 104/platelets 347 07/03/2020 serum iron 199/TIBC 249/transferrin 178 ferritin 42  Today hemoglobin 8.1/hematocrit 25.8/MCV 104 Sodium 132/BUN 7/creatinine 0.75, LFTs within normal limits Stool heme positive.   Past Medical History:  Diagnosis Date   Cat allergies    COPD (chronic obstructive pulmonary disease) ([employer])    Hypertension    Osteoporosis    Pulmonary embolism ([place]) 2017   Rheumatoid arthritis(714.0)     Dr Tobie Lords follows    Past Surgical History:  Procedure Laterality Date   CYSTECTOMY Right    breast   LEFT HEART CATH AND CORONARY ANGIOGRAPHY N/A 01/05/2019   Procedure: LEFT HEART CATH AND CORONARY ANGIOGRAPHY;  Surgeon: Jettie Booze, MD;  Location: [facility];  Service: Cardiovascular;  Laterality: N/A;    Prior to Admission medications   Medication Sig Start Date End Date Taking? Authorizing Provider  acetaminophen (TYLENOL) 500 MG tablet Take 500 mg by mouth every 6 (six) hours as needed for moderate pain or headache.    [provider]  albuterol (VENTOLIN HFA) 108 (90 Base) MCG/ACT inhaler Inhale 2 puffs into the lungs every 4 (four) hours as needed for wheezing or shortness of breath.    [provider]  amLODipine (NORVASC) 5 MG tablet Take 1 tablet (5 mg total) by mouth daily. 03/19/20   Tobb, Kardie, DO  apixaban (ELIQUIS) 2.5 MG TABS tablet Take 1 tablet (2.5 mg total) by mouth 2 (two) times daily. 07/23/20   Tobb, Kardie, DO  atorvastatin (LIPITOR) 20 MG tablet Take 1 tablet (20 mg total) by mouth daily. 06/22/20   Tobb, Kardie, DO  Budeson-Glycopyrrol-Formoterol (BREZTRI AEROSPHERE) 160-9-4.8 MCG/ACT AERO Inhale 2 puffs into the lungs 2 (two) times daily. 01/19/20   Icard, Octavio Graves, DO  budesonide-formoterol (SYMBICORT) 160-4.5 MCG/ACT inhaler Inhale 2 puffs into the lungs every 12 (twelve) hours. Patient not taking: Reported on 07/17/2020 12/01/19   Parrett, Fonnie Mu, NP  carvedilol (COREG) 3.125 MG tablet Take 1 tablet (3.125 mg total) by  mouth 2 (two) times daily with a meal. 05/28/20   Tobb, Kardie, DO  cholecalciferol (VITAMIN D) 25 MCG (1000 UT) tablet Take 1,000 Units by mouth daily.     [provider]  ferrous sulfate 325 (65 FE) MG EC tablet Take 325 mg by mouth daily with breakfast.    [provider]  fexofenadine (ALLEGRA) 180 MG tablet Take 180 mg by mouth daily.    [provider]   hydroxychloroquine (PLAQUENIL) 200 MG tablet Take 200 mg by mouth 2 (two) times daily.  09/17/15   [provider]  leflunomide (ARAVA) 20 MG tablet Take 20 mg by mouth daily.    [provider]  montelukast (SINGULAIR) 10 MG tablet Take 1 tablet (10 mg total) by mouth at bedtime. Patient taking differently: Take 10 mg by mouth daily. 05/08/20   Icard, Octavio Graves, DO  omeprazole (PRILOSEC) 20 MG capsule Take 1 capsule (20 mg total) by mouth daily. 07/11/20   Icard, Octavio Graves, DO  predniSONE (DELTASONE) 5 MG tablet Take 5 mg by mouth daily with breakfast.    [provider]  sertraline (ZOLOFT) 100 MG tablet Take 100 mg by mouth daily.  07/27/10   [provider]  Tiotropium Bromide Monohydrate (SPIRIVA RESPIMAT) 2.5 MCG/ACT AERS Inhale 2 puffs into the lungs daily. Patient not taking: Reported on 07/17/2020 11/28/19   Lauraine Rinne, NP  valsartan (DIOVAN) 320 MG tablet Take 320 mg by mouth daily.    [provider]  vitamin B-12 (CYANOCOBALAMIN) 1000 MCG tablet Take 1,000 mcg by mouth daily.     [provider]    Current Facility-Administered Medications  Medication Dose Route Frequency Provider Last Rate Last Admin   [START ON 07/26/2020] 0.9 %  sodium chloride infusion   Intravenous Continuous Fredia Sorrow, MD       0.9 %  sodium chloride infusion  10 mL/hr Intravenous Once Fredia Sorrow, MD       pantoprazole (PROTONIX) injection 40 mg  40 mg Intravenous Q12H Esterwood, Amy S, PA-C       Current Outpatient Medications  Medication Sig Dispense Refill   acetaminophen (TYLENOL) 500 MG tablet Take 500 mg by mouth every 6 (six) hours as needed for moderate pain or headache.     albuterol (VENTOLIN HFA) 108 (90 Base) MCG/ACT inhaler Inhale 2 puffs into the lungs every 4 (four) hours as needed for wheezing or shortness of breath.     amLODipine (NORVASC) 5 MG tablet Take 1 tablet (5 mg total) by mouth daily. 90 tablet 1   apixaban (ELIQUIS)  2.5 MG TABS tablet Take 1 tablet (2.5 mg total) by mouth 2 (two) times daily. 60 tablet 5   atorvastatin (LIPITOR) 20 MG tablet Take 1 tablet (20 mg total) by mouth daily. 30 tablet 1   Budeson-Glycopyrrol-Formoterol (BREZTRI AEROSPHERE) 160-9-4.8 MCG/ACT AERO Inhale 2 puffs into the lungs 2 (two) times daily. 10.7 g 4   budesonide-formoterol (SYMBICORT) 160-4.5 MCG/ACT inhaler Inhale 2 puffs into the lungs every 12 (twelve) hours. (Patient not taking: Reported on 07/17/2020) 10.2 g 11   carvedilol (COREG) 3.125 MG tablet Take 1 tablet (3.125 mg total) by mouth 2 (two) times daily with a meal. 180 tablet 0   cholecalciferol (VITAMIN D) 25 MCG (1000 UT) tablet Take 1,000 Units by mouth daily.      ferrous sulfate 325 (65 FE) MG EC tablet Take 325 mg by mouth daily with breakfast.     fexofenadine (ALLEGRA) 180 MG  tablet Take 180 mg by mouth daily.     hydroxychloroquine (PLAQUENIL) 200 MG tablet Take 200 mg by mouth 2 (two) times daily.   3   leflunomide (ARAVA) 20 MG tablet Take 20 mg by mouth daily.     montelukast (SINGULAIR) 10 MG tablet Take 1 tablet (10 mg total) by mouth at bedtime. (Patient taking differently: Take 10 mg by mouth daily.) 90 tablet 1   omeprazole (PRILOSEC) 20 MG capsule Take 1 capsule (20 mg total) by mouth daily. 90 capsule 1   predniSONE (DELTASONE) 5 MG tablet Take 5 mg by mouth daily with breakfast.     sertraline (ZOLOFT) 100 MG tablet Take 100 mg by mouth daily.      Tiotropium Bromide Monohydrate (SPIRIVA RESPIMAT) 2.5 MCG/ACT AERS Inhale 2 puffs into the lungs daily. (Patient not taking: Reported on 07/17/2020) 4 g 11   valsartan (DIOVAN) 320 MG tablet Take 320 mg by mouth daily.     vitamin B-12 (CYANOCOBALAMIN) 1000 MCG tablet Take 1,000 mcg by mouth daily.       Allergies as of 07/25/2020 - Review Complete 07/25/2020  Allergen Reaction Noted   Other Shortness Of Breath 07/27/2018    Family History  Problem Relation Age of Onset   Heart disease Father     Stroke Mother    Clotting disorder Mother    Heart disease Sister    Sleep apnea Sister    Emphysema Sister    Breast cancer Maternal Aunt    Emphysema Paternal Grandmother     Social History   Socioeconomic History   Marital status: Widowed    Spouse name: Not on file   Number of children: Not on file   Years of education: Not on file   Highest education level: Not on file  Occupational History   Occupation: retired  Tobacco Use   Smoking status: Former    Packs/day: 1.00    Years: 40.00    Pack years: 40.00    Types: Cigarettes    Quit date: 02/03/2001    Years since quitting: 19.4   Smokeless tobacco: Never  Vaping Use   Vaping Use: Never used  Substance and Sexual Activity   Alcohol use: Yes    Alcohol/week: 2.0 standard drinks    Types: 2 Standard drinks or equivalent per week    Comment: 2 drinks daily   Drug use: No   Sexual activity: Not on file  Other Topics Concern   Not on file  Social History Narrative   Tobacco use, amount per day now: NONE   Past tobacco use, amount per day: 1 PACK PER DAY   How many years did you use tobacco:TOO MANY STOPPED IN 2003   Alcohol use (drinks per week): 14   Diet: GOOD   Do you drink/eat things with caffeine: YES   Marital status: WIDOW                What year were you married? 1967   Do you live in a house, apartment, assisted living, condo, trailer, etc.? HOUSE   Is it one or more stories? SPLIT LEVEL   How many persons live in your home? 1   Do you have pets in your home?( please list) NONE   Current or past profession: [employer]   Do you exercise?  NO  Type and how often?   Do you have a living will? YES    Do you have a DNR form?  YES                                 If not, do you want to discuss one?   Do you have signed POA/HPOA forms?        ??              If so, please bring to you appointment      [facility] (05/16/16):   Originally  from [place], [place]. 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 No pets currently. No bird, mold, or hot tub exposure.    Social Determinants of Health   Financial Resource Strain: Not on file  Food Insecurity: Not on file  Transportation Needs: Not on file  Physical Activity: Not on file  Stress: Not on file  Social Connections: Not on file  Intimate Partner Violence: Not on file    Review of Systems: Pertinent positive and negative review of systems were noted in the above HPI section.  All other review of systems was otherwise negative.  Physical Exam: Vital signs in last 24 hours: Temp:  [98.1 F (36.7 C)] 98.1 F (36.7 C) (06/22 1006) Pulse Rate:  [86-96] 91 (06/22 1600) Resp:  [16-22] 20 (06/22 1600) BP: (113-138)/(60-92) 137/67 (06/22 1600) SpO2:  [97 %-100 %] 100 % (06/22 1600)   General:   Alert,  Well-developed, chronically ill-appearing elderly white female pleasant and cooperative in NAD.  Pale, on O2 2 L Head:  Normocephalic and atraumatic. Eyes:  Sclera clear, no icterus.   Conjunctiva pale Ears:  Normal auditory acuity. Nose:  No deformity, discharge,  or lesions. Mouth:  No deformity or lesions.   Neck:  Supple; no masses or thyromegaly. Lungs: Decreased breath sounds bilaterally with basilar crackles  heart:  Regular rate and rhythm; no murmurs, clicks, rubs,  or gallops. Abdomen:  Soft,nontender, BS active,no palpable hepatosplenomegaly, no appreciable fluid wave Msk:  Symmetrical without gross deformities. . Pulses:  Normal pulses noted. Extremities: Trace edema bilateral lower extremities Neurologic:  Alert and  oriented x4;  grossly normal neurologically. Skin:  Intact without significant lesions or rashes.. Psych:  Alert and cooperative. Normal mood and affect.  Intake/Output from previous day: No intake/output data recorded. Intake/Output this shift: No intake/output data recorded.  Lab Results: Recent Labs     07/25/20 1018  WBC 10.0  HGB 8.1*  HCT 25.8*  PLT 337   BMET Recent Labs    07/25/20 1018  NA 132*  K 3.9  CL 93*  CO2 30  GLUCOSE 90  BUN 7*  CREATININE 0.75  CALCIUM 8.7*   LFT Recent Labs    07/25/20 1018  PROT 5.8*  ALBUMIN 2.9*  AST 29  ALT 21  ALKPHOS 77  BILITOT 0.4   PT/INR No results for input(s): LABPROT, INR in the last 72 hours. Hepatitis Panel No results for input(s): HEPBSAG, HCVAB, HEPAIGM, HEPBIGM in the last 72 hours.     IMPRESSION:   #71 75 year old white female with what appears to be fairly well compensated EtOH induced cirrhosis with ongoing daily EtOH use who presents now with progressive anemia and heme positive stool, noting dark stools over the past multiple weeks.  This has occurred in the setting of chronic anticoagulation-Eliquis  Suspect chronic slow  GI blood loss no prior history of varices and on carvedilol, rule out portal gastropathy, rule out AVMs. She had endoscopic evaluation about 1-1/2 years ago unrevealing in [place]  #2 atrial fibrillation 3 chronic anticoagulation-Eliquis #4 chronic kidney disease stage III 5.  History of rheumatoid arthritis 6.  COPD on home O2 chronically 2 L  Plan; 2 g sodium diet Serial hemoglobins and transfuse for hemoglobin 7.5 or less Hold Eliquis Pro time/INR and AFP Watch for EtOH withdrawal We will plan endoscopic evaluation for EGD/enteroscopy probably Friday to allow Eliquis to washout (last dose this morning )  Procedures were discussed in detail with the patient including indications risk and benefits and she is agreeable to proceed.  She understands that she is at increased risk for sedation given COPD and chronic oxygen use.  She is expressed desire to be DNR but understands that complication during the procedure could potentially result in short-term vent  support and she is agreeable.    Amy Esterwood PA-C 07/25/2020, 5:24 PM     [facility] Attending   I have taken an  interval history, reviewed the chart and examined the patient. I agree with the Advanced Practitioner's note, impression and recommendations.   Gatha Mayer, MD, [facility]  Gastroenterology 07/25/2020 6:19 PM

## 2020-07-25 NOTE — ED Notes (Signed)
Blood consent signed and at bedside

## 2020-07-25 NOTE — ED Provider Notes (Signed)
[facility] EMERGENCY DEPARTMENT Provider Note   CSN: [id number] Arrival date & time: 07/25/20  1002     History Chief Complaint  Patient presents with   Abnormal Lab    Desiree [name] is a 75 y.o. female.  Patient sent in by primary care provider Dr. Bonner Puna oh in the [place] area.  Patient seen here June 13.  Hemoglobin was 8.7 then.  Today it is 8.1.  Patient was Hemoccult positive in the office for melena yesterday.  Patient has a history of cirrhosis due to alcohol.  Patient is on Eliquis for A. fib.  Has a history of COPD on 2 L of oxygen.  Patient had an upper endoscopy done a year ago without any significant findings by GI medicine in the [place] area.  And is on omeprazole.  Patient sent in by primary care doctor for the decreased hematocrit the melena and the patient is very pale and fatigued.      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[place]) 07/14/2017   GERD without esophagitis 07/14/2017   History of pulmonary embolism 07/14/2017   Malaise and fatigue 07/14/2017   Mild episode of recurrent major depressive disorder ([place]) 07/14/2017   Mixed hyperlipidemia 07/14/2017   Prediabetes 07/14/2017   Skin rash 07/14/2017   Vitamin B12 deficiency 07/14/2017   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   Multiple lung nodules on CT 05/16/2016   Osteoporosis 05/16/2016   Centrilobular emphysema ([place]) 05/16/2016   Allergic rhinitis 07/17/2015   Pulmonary  embolism ([place]) 11/23/2014   Rheumatoid arthritis(714.0) 09/20/2010   Rheumatoid arthritis ([place]) 09/20/2010   COPD (chronic obstructive pulmonary disease) ([place]) 08/22/2010   Hypertension 08/22/2010    Past Surgical History:  Procedure Laterality Date   CYSTECTOMY Right    breast   LEFT HEART CATH AND CORONARY ANGIOGRAPHY N/A 01/05/2019   Procedure: LEFT HEART CATH AND CORONARY ANGIOGRAPHY;  Surgeon: Jettie Booze, MD;  Location: [facility];  Service: Cardiovascular;  Laterality: N/A;     OB History   No obstetric history on file.     Family History  Problem Relation Age of Onset   Heart disease Father    Stroke Mother    Clotting disorder Mother    Heart disease Sister    Sleep apnea Sister    Emphysema Sister    Breast cancer Maternal Aunt    Emphysema Paternal Grandmother     Social History   Tobacco Use   Smoking status: Former    Packs/day: 1.00    Years: 40.00    Pack years: 40.00    Types: Cigarettes    Quit date: 02/03/2001    Years since quitting: 19.4   Smokeless tobacco: Never  Vaping Use   Vaping Use: Never used  Substance Use Topics   Alcohol use: Yes    Alcohol/week: 2.0 standard drinks    Types: 2 Standard drinks  or equivalent per week    Comment: 2 drinks daily   Drug use: No    Home Medications Prior to Admission medications   Medication Sig Start Date End Date Taking? Authorizing Provider  acetaminophen (TYLENOL) 500 MG tablet Take 500 mg by mouth every 6 (six) hours as needed for moderate pain or headache.    [provider]  albuterol (VENTOLIN HFA) 108 (90 Base) MCG/ACT inhaler Inhale 2 puffs into the lungs every 4 (four) hours as needed for wheezing or shortness of breath.    [provider]  amLODipine (NORVASC) 5 MG tablet Take 1 tablet (5 mg total) by mouth daily. 03/19/20   Tobb, Kardie, DO  apixaban (ELIQUIS) 2.5 MG TABS tablet Take 1 tablet (2.5 mg total) by mouth 2 (two) times daily. 07/23/20   Tobb,  Kardie, DO  atorvastatin (LIPITOR) 20 MG tablet Take 1 tablet (20 mg total) by mouth daily. 06/22/20   Tobb, Kardie, DO  Budeson-Glycopyrrol-Formoterol (BREZTRI AEROSPHERE) 160-9-4.8 MCG/ACT AERO Inhale 2 puffs into the lungs 2 (two) times daily. 01/19/20   Icard, Octavio Graves, DO  budesonide-formoterol (SYMBICORT) 160-4.5 MCG/ACT inhaler Inhale 2 puffs into the lungs every 12 (twelve) hours. Patient not taking: Reported on 07/17/2020 12/01/19   Parrett, Fonnie Mu, NP  carvedilol (COREG) 3.125 MG tablet Take 1 tablet (3.125 mg total) by mouth 2 (two) times daily with a meal. 05/28/20   Tobb, Kardie, DO  cholecalciferol (VITAMIN D) 25 MCG (1000 UT) tablet Take 1,000 Units by mouth daily.     [provider]  ferrous sulfate 325 (65 FE) MG EC tablet Take 325 mg by mouth daily with breakfast.    [provider]  fexofenadine (ALLEGRA) 180 MG tablet Take 180 mg by mouth daily.    [provider]  hydroxychloroquine (PLAQUENIL) 200 MG tablet Take 200 mg by mouth 2 (two) times daily.  09/17/15   [provider]  leflunomide (ARAVA) 20 MG tablet Take 20 mg by mouth daily.    [provider]  montelukast (SINGULAIR) 10 MG tablet Take 1 tablet (10 mg total) by mouth at bedtime. Patient taking differently: Take 10 mg by mouth daily. 05/08/20   Icard, Octavio Graves, DO  omeprazole (PRILOSEC) 20 MG capsule Take 1 capsule (20 mg total) by mouth daily. 07/11/20   Icard, Octavio Graves, DO  predniSONE (DELTASONE) 5 MG tablet Take 5 mg by mouth daily with breakfast.    [provider]  sertraline (ZOLOFT) 100 MG tablet Take 100 mg by mouth daily.  07/27/10   [provider]  Tiotropium Bromide Monohydrate (SPIRIVA RESPIMAT) 2.5 MCG/ACT AERS Inhale 2 puffs into the lungs daily. Patient not taking: Reported on 07/17/2020 11/28/19   Lauraine Rinne, NP  valsartan (DIOVAN) 320 MG tablet Take 320 mg by mouth daily.    [provider]  vitamin B-12 (CYANOCOBALAMIN) 1000 MCG  tablet Take 1,000 mcg by mouth daily.     [provider]    Allergies    Other  Review of Systems   Review of Systems  Constitutional:  Positive for fatigue. Negative for chills and fever.  HENT:  Negative for ear pain and sore throat.   Eyes:  Negative for pain and visual disturbance.  Respiratory:  Negative for cough and shortness of breath.   Cardiovascular:  Negative for chest pain and palpitations.  Gastrointestinal:  Negative for abdominal pain and vomiting.  Genitourinary:  Negative for dysuria and hematuria.  Musculoskeletal:  Negative for arthralgias  and back pain.  Skin:  Negative for color change and rash.  Neurological:  Negative for seizures and syncope.  Hematological:  Bruises/bleeds easily.  All other systems reviewed and are negative.  Physical Exam Updated Vital Signs BP 118/65   Pulse 90   Temp 98.1 F (36.7 C) (Oral)   Resp (!) 22   SpO2 99%   Physical Exam Vitals and nursing note reviewed.  Constitutional:      General: She is not in acute distress.    Appearance: Normal appearance. She is well-developed.  HENT:     Head: Normocephalic and atraumatic.  Eyes:     Extraocular Movements: Extraocular movements intact.     Conjunctiva/sclera: Conjunctivae normal.     Pupils: Pupils are equal, round, and reactive to light.  Cardiovascular:     Rate and Rhythm: Normal rate. Rhythm irregular.     Heart sounds: No murmur heard. Pulmonary:     Effort: Pulmonary effort is normal. No respiratory distress.     Breath sounds: Normal breath sounds.  Abdominal:     Palpations: Abdomen is soft.     Tenderness: There is no abdominal tenderness.  Genitourinary:    Comments: Rectal without much stool in it.  Hemoccult sent.  No definitive melena no definitive red blood per rectum.  No rectal mass Musculoskeletal:     Cervical back: Normal range of motion and neck supple.  Skin:    General: Skin is warm and dry.     Coloration: Skin is pale.   Neurological:     General: No focal deficit present.     Mental Status: She is alert and oriented to person, place, and time.     Cranial Nerves: Cranial nerve deficit present.     Sensory: No sensory deficit.    ED Results / Procedures / Treatments   Labs (all labs ordered are listed, but only abnormal results are displayed) Labs Reviewed  COMPREHENSIVE METABOLIC PANEL - Abnormal; Notable for the following components:      Result Value   Sodium 132 (*)    Chloride 93 (*)    BUN 7 (*)    Calcium 8.7 (*)    Total Protein 5.8 (*)    Albumin 2.9 (*)    All other components within normal limits  CBC - Abnormal; Notable for the following components:   RBC 2.48 (*)    Hemoglobin 8.1 (*)    HCT 25.8 (*)    MCV 104.0 (*)    All other components within normal limits  RESP PANEL BY RT-PCR (FLU A&B, COVID) ARPGX2  LIPASE, BLOOD  PROTIME-INR  HEMOGLOBIN AND HEMATOCRIT, BLOOD  POC OCCULT BLOOD, ED  POC OCCULT BLOOD, ED  TYPE AND SCREEN  ABO/RH    EKG None  Radiology DG Chest Port 1 View  Result Date: 07/25/2020 CLINICAL DATA:  Possible GI bleed EXAM: PORTABLE CHEST 1 VIEW COMPARISON:  07/16/2020, CT 11/22/2019 FINDINGS: Possible trace pleural effusions. Hazy atelectasis right base. No consolidation. Stable cardiomediastinal silhouette with aortic atherosclerosis. No pneumothorax. IMPRESSION: Slightly low lung volume with possible trace pleural effusion and hazy atelectasis right base Electronically Signed   By: Donavan Foil M.D.   On: 07/25/2020 15:47    Procedures Procedures   CRITICAL CARE Performed by: Fredia Sorrow Total critical care time: 40 minutes Critical care time was exclusive of separately billable procedures and treating other patients. Critical care was necessary to treat or prevent imminent or life-threatening deterioration. Critical care was time spent  personally by me on the following activities: development of treatment plan with patient and/or surrogate  as well as nursing, discussions with consultants, evaluation of patient's response to treatment, examination of patient, obtaining history from patient or surrogate, ordering and performing treatments and interventions, ordering and review of laboratory studies, ordering and review of radiographic studies, pulse oximetry and re-evaluation of patient's condition.   Medications Ordered in ED Medications  0.9 %  sodium chloride infusion (has no administration in time range)    ED Course  I have reviewed the triage vital signs and the nursing notes.  Pertinent labs & imaging results that were available during my care of the patient were reviewed by me and considered in my medical decision making (see chart for details).    MDM Rules/Calculators/A&P                          Discussed with [facility]ology to consult.  And they will consult on the patient.  Also discussed with internal medicine for unassigned medicine admission.  They will admit.  I will order a unit of blood.  Patient currently hemodynamically stable.  But certainly very pale certainly good history of melena.  Heme positive yesterday.  Hemoccult pending today.  No gross blood.  Or melena.  Patient with a history of cirrhosis due to alcohol history of COPD normally on 2 L.  And patient on Eliquis for atrial fib.  According to notes and according to patient she is still taking omeprazole.  Patient not tachycardic blood pressures are fine.     Final Clinical Impression(s) / ED Diagnoses Final diagnoses:  Gastrointestinal hemorrhage, unspecified gastrointestinal hemorrhage type    Rx / DC Orders ED Discharge Orders     None        Fredia Sorrow, MD 07/25/20 1606

## 2020-07-25 NOTE — ED Notes (Signed)
Daughter, Alyse Low, would like updates if pt is moved upstairs - [phone]

## 2020-07-26 DIAGNOSIS — D508 Other iron deficiency anemias: Secondary | ICD-10-CM

## 2020-07-26 DIAGNOSIS — R296 Repeated falls: Secondary | ICD-10-CM | POA: Diagnosis present

## 2020-07-26 DIAGNOSIS — Z86711 Personal history of pulmonary embolism: Secondary | ICD-10-CM | POA: Diagnosis not present

## 2020-07-26 DIAGNOSIS — Z9889 Other specified postprocedural states: Secondary | ICD-10-CM | POA: Diagnosis not present

## 2020-07-26 DIAGNOSIS — K222 Esophageal obstruction: Secondary | ICD-10-CM | POA: Diagnosis present

## 2020-07-26 DIAGNOSIS — E876 Hypokalemia: Secondary | ICD-10-CM | POA: Diagnosis not present

## 2020-07-26 DIAGNOSIS — J309 Allergic rhinitis, unspecified: Secondary | ICD-10-CM | POA: Diagnosis present

## 2020-07-26 DIAGNOSIS — J90 Pleural effusion, not elsewhere classified: Secondary | ICD-10-CM | POA: Diagnosis present

## 2020-07-26 DIAGNOSIS — J432 Centrilobular emphysema: Secondary | ICD-10-CM | POA: Diagnosis present

## 2020-07-26 DIAGNOSIS — Z66 Do not resuscitate: Secondary | ICD-10-CM | POA: Diagnosis present

## 2020-07-26 DIAGNOSIS — D649 Anemia, unspecified: Secondary | ICD-10-CM | POA: Diagnosis present

## 2020-07-26 DIAGNOSIS — K922 Gastrointestinal hemorrhage, unspecified: Secondary | ICD-10-CM | POA: Diagnosis not present

## 2020-07-26 DIAGNOSIS — Z7951 Long term (current) use of inhaled steroids: Secondary | ICD-10-CM | POA: Diagnosis not present

## 2020-07-26 DIAGNOSIS — M24411 Recurrent dislocation, right shoulder: Secondary | ICD-10-CM | POA: Diagnosis present

## 2020-07-26 DIAGNOSIS — E871 Hypo-osmolality and hyponatremia: Secondary | ICD-10-CM | POA: Diagnosis present

## 2020-07-26 DIAGNOSIS — D500 Iron deficiency anemia secondary to blood loss (chronic): Secondary | ICD-10-CM | POA: Diagnosis present

## 2020-07-26 DIAGNOSIS — I480 Paroxysmal atrial fibrillation: Secondary | ICD-10-CM | POA: Diagnosis present

## 2020-07-26 DIAGNOSIS — I129 Hypertensive chronic kidney disease with stage 1 through stage 4 chronic kidney disease, or unspecified chronic kidney disease: Secondary | ICD-10-CM | POA: Diagnosis present

## 2020-07-26 DIAGNOSIS — N183 Chronic kidney disease, stage 3 unspecified: Secondary | ICD-10-CM | POA: Diagnosis present

## 2020-07-26 DIAGNOSIS — C182 Malignant neoplasm of ascending colon: Secondary | ICD-10-CM | POA: Diagnosis present

## 2020-07-26 DIAGNOSIS — K7030 Alcoholic cirrhosis of liver without ascites: Secondary | ICD-10-CM | POA: Diagnosis present

## 2020-07-26 DIAGNOSIS — M069 Rheumatoid arthritis, unspecified: Secondary | ICD-10-CM | POA: Diagnosis present

## 2020-07-26 DIAGNOSIS — Z20822 Contact with and (suspected) exposure to covid-19: Secondary | ICD-10-CM | POA: Diagnosis present

## 2020-07-26 DIAGNOSIS — F1010 Alcohol abuse, uncomplicated: Secondary | ICD-10-CM | POA: Diagnosis present

## 2020-07-26 DIAGNOSIS — Z9981 Dependence on supplemental oxygen: Secondary | ICD-10-CM | POA: Diagnosis not present

## 2020-07-26 DIAGNOSIS — K824 Cholesterolosis of gallbladder: Secondary | ICD-10-CM | POA: Diagnosis not present

## 2020-07-26 DIAGNOSIS — R1115 Cyclical vomiting syndrome unrelated to migraine: Secondary | ICD-10-CM | POA: Diagnosis not present

## 2020-07-26 DIAGNOSIS — K829 Disease of gallbladder, unspecified: Secondary | ICD-10-CM | POA: Diagnosis present

## 2020-07-26 DIAGNOSIS — K5730 Diverticulosis of large intestine without perforation or abscess without bleeding: Secondary | ICD-10-CM | POA: Diagnosis present

## 2020-07-26 DIAGNOSIS — C772 Secondary and unspecified malignant neoplasm of intra-abdominal lymph nodes: Secondary | ICD-10-CM | POA: Diagnosis present

## 2020-07-26 DIAGNOSIS — J3081 Allergic rhinitis due to animal (cat) (dog) hair and dander: Secondary | ICD-10-CM | POA: Insufficient documentation

## 2020-07-26 DIAGNOSIS — Z7901 Long term (current) use of anticoagulants: Secondary | ICD-10-CM | POA: Diagnosis not present

## 2020-07-26 LAB — TYPE AND SCREEN
ABO/RH(D): O POS
Antibody Screen: NEGATIVE
Unit division: 0

## 2020-07-26 LAB — BPAM RBC
Blood Product Expiration Date: 202207272359
ISSUE DATE / TIME: 202206222048
Unit Type and Rh: 5100

## 2020-07-26 LAB — HEMOGLOBIN AND HEMATOCRIT, BLOOD
HCT: 32.4 % — ABNORMAL LOW (ref 36.0–46.0)
HCT: 33 % — ABNORMAL LOW (ref 36.0–46.0)
Hemoglobin: 10 g/dL — ABNORMAL LOW (ref 12.0–15.0)
Hemoglobin: 10.3 g/dL — ABNORMAL LOW (ref 12.0–15.0)

## 2020-07-26 LAB — CBC
HCT: 27.6 % — ABNORMAL LOW (ref 36.0–46.0)
Hemoglobin: 8.6 g/dL — ABNORMAL LOW (ref 12.0–15.0)
MCH: 29.6 pg (ref 26.0–34.0)
MCHC: 31.2 g/dL (ref 30.0–36.0)
MCV: 94.8 fL (ref 80.0–100.0)
Platelets: 255 10*3/uL (ref 150–400)
RBC: 2.91 MIL/uL — ABNORMAL LOW (ref 3.87–5.11)
RDW: 20.6 % — ABNORMAL HIGH (ref 11.5–15.5)
WBC: 8 10*3/uL (ref 4.0–10.5)
nRBC: 0 % (ref 0.0–0.2)

## 2020-07-26 LAB — BASIC METABOLIC PANEL
Anion gap: 7 (ref 5–15)
BUN: 10 mg/dL (ref 8–23)
CO2: 29 mmol/L (ref 22–32)
Calcium: 8.5 mg/dL — ABNORMAL LOW (ref 8.9–10.3)
Chloride: 97 mmol/L — ABNORMAL LOW (ref 98–111)
Creatinine, Ser: 0.8 mg/dL (ref 0.44–1.00)
GFR, Estimated: >60 mL/min (ref >60)
Glucose, Bld: 94 mg/dL (ref 70–99)
Potassium: 3.9 mmol/L (ref 3.5–5.1)
Sodium: 133 mmol/L — ABNORMAL LOW (ref 135–145)

## 2020-07-26 MED ORDER — PEG-KCL-NACL-NASULF-NA ASC-C 100 G PO SOLR
0.5000 | Freq: Once | ORAL | Status: AC
  Administered 2020-07-26: 100 g via ORAL

## 2020-07-26 MED ORDER — PEG-KCL-NACL-NASULF-NA ASC-C 100 G PO SOLR: 1.0000 | Freq: Once | ORAL | Status: DC

## 2020-07-26 MED ORDER — PEG-KCL-NACL-NASULF-NA ASC-C 100 G PO SOLR
0.5000 | Freq: Once | ORAL | Status: AC
  Administered 2020-07-26: 100 g via ORAL
  Filled 2020-07-26: qty 1

## 2020-07-26 NOTE — Evaluation (Signed)
Physical Therapy Evaluation Patient Details Name: Desiree [name] MRN: [id number] DOB: 27-Jun-1945 Today's Date: 07/26/2020   History of Present Illness  Pt is a 75 y/o female admitted 6/22 secondary to symptomatic anemia. PMH includes cirrhosis, a fib, COPD on 2L and CKD.  Clinical Impression  Pt admitted secondary to problem above with deficits below. Pt limited this session secondary to increased SOB and fatigue. Oxygen sats >95% on 2L. Was able to stand and take side steps at EOB with min to min guard A. Pt reports she is normally SOB secondary to COPD. Pt prefers to d/c back home at d/c. REcommending HHPT at d/c to increase independence and safety with mobility. Will continue to follow acutely.     Follow Up Recommendations Home health PT    Equipment Recommendations  None recommended by PT    Recommendations for Other Services       Precautions / Restrictions Precautions Precautions: Fall Restrictions Weight Bearing Restrictions: No      Mobility  Bed Mobility Overal bed mobility: Needs Assistance Bed Mobility: Supine to Sit;Sit to Supine     Supine to sit: Min guard Sit to supine: Min guard   General bed mobility comments: Min guard for safety to perform bed mobility on stretcher.    Transfers Overall transfer level: Needs assistance Equipment used: 1 person hand held assist Transfers: Sit to/from Stand Sit to Stand: Min assist         General transfer comment: Min A for steadying to stand from higher stretcher.  Ambulation/Gait Ambulation/Gait assistance: Min guard   Assistive device: 1 person hand held assist   Gait velocity: Decreased   General Gait Details: took side steps at EOB in ED. Noted increased SOB and fatigue. Mild unsteadiness noted as well. Oxygen sats >95% on 2L.  Stairs            Wheelchair Mobility    Modified Rankin (Stroke Patients Only)       Balance Overall balance assessment: Needs assistance Sitting-balance  support: No upper extremity supported Sitting balance-Leahy Scale: Fair     Standing balance support: Bilateral upper extremity supported Standing balance-Leahy Scale: Poor Standing balance comment: Reliant on BUE support                             Pertinent Vitals/Pain Pain Assessment: No/denies pain    Home Living Family/patient expects to be discharged to:: Private residence Living Arrangements: Alone Available Help at Discharge: Friend(s);Available PRN/intermittently Type of Home: Independent living facility [facility] top) Home Access: Level entry     Home Layout: One level Home Equipment: Toilet riser;Walker - 4 wheels;Shower seat Additional Comments: On 2L of oxygen at home    Prior Function Level of Independence: Independent with assistive device(s)         Comments: Uses rollator for ambulation     Hand Dominance        Extremity/Trunk Assessment   Upper Extremity Assessment Upper Extremity Assessment: Defer to OT evaluation    Lower Extremity Assessment Lower Extremity Assessment: Generalized weakness    Cervical / Trunk Assessment Cervical / Trunk Assessment: Kyphotic  Communication   Communication: No difficulties  Cognition Arousal/Alertness: Awake/alert Behavior During Therapy: WFL for tasks assessed/performed Overall Cognitive Status: Within Functional Limits for tasks assessed  General Comments      Exercises     Assessment/Plan    PT Assessment Patient needs continued PT services  PT Problem List Decreased strength;Decreased activity tolerance;Decreased balance;Decreased mobility;Decreased knowledge of precautions;Cardiopulmonary status limiting activity       PT Treatment Interventions DME instruction;Gait training;Functional mobility training;Therapeutic activities;Therapeutic exercise;Balance training;Patient/family education    PT Goals (Current goals can be  found in the Care Plan section)  Acute Rehab PT Goals Patient Stated Goal: to go home PT Goal Formulation: With patient Time For Goal Achievement: 08/09/20 Potential to Achieve Goals: Fair    Frequency Min 3X/week   Barriers to discharge Decreased caregiver support      Co-evaluation               AM-PAC PT "6 Clicks" Mobility  Outcome Measure Help needed turning from your back to your side while in a flat bed without using bedrails?: A Little Help needed moving from lying on your back to sitting on the side of a flat bed without using bedrails?: A Little Help needed moving to and from a bed to a chair (including a wheelchair)?: A Little Help needed standing up from a chair using your arms (e.g., wheelchair or bedside chair)?: A Little Help needed to walk in hospital room?: A Little Help needed climbing 3-5 steps with a railing? : A Lot 6 Click Score: 17    End of Session Equipment Utilized During Treatment: Gait belt Activity Tolerance: Patient limited by fatigue Patient left: in bed;with call bell/phone within reach (on stretcher in ED) Nurse Communication: Mobility status PT Visit Diagnosis: Unsteadiness on feet (R26.81);Muscle weakness (generalized) (M62.81)    Time: 3335-4562 PT Time Calculation (min) (ACUTE ONLY): 14 min   Charges:   PT Evaluation $PT Eval Moderate Complexity: 1 Mod          Reuel Derby, PT, DPT  Acute Rehabilitation Services  Pager: [phone] Office: [phone]   Rudean Hitt 07/26/2020, 12:37 PM

## 2020-07-26 NOTE — Progress Notes (Addendum)
Patient ID: Desiree [name], female   DOB: Dec 18, 1945, 75 y.o.   MRN: [id number]    Progress Note   Subjective   Day # 2  CC; weakness, progressive anemia heme positive stool, in setting of chronic Eliquis  BUN 10/creatinine 0.8 Ferritin 64/iron 36/TIBC 266/sat 14 INR 1.0  Hemoglobin 8.6 today/hematocrit 27.6 post 1 unit  Eliquis on hold  No specific complaints today, did not really remember our conversation about the procedures last night, reviewed again with her today Thus far says her nerves are doing fine No bowel movements or overt bleeding    Objective   Vital signs in last 24 hours: Temp:  [98.1 F (36.7 C)-98.7 F (37.1 C)] 98.1 F (36.7 C) (06/23 0739) Pulse Rate:  [86-98] 98 (06/23 0900) Resp:  [16-26] 24 (06/23 0900) BP: (110-155)/(50-96) 147/69 (06/23 0900) SpO2:  [95 %-100 %] 96 % (06/23 0900)   General:    Elderly female in NAD Heart:  Regular rate and rhythm; no murmurs Lungs: Decreased breath sounds bilaterally, on nasal O2 Abdomen:  Soft, nontender and nondistended. Normal bowel sounds. Extremities:  Without edema. Neurologic:  Alert and oriented,  grossly normal neurologically. Psych:  Cooperative. Normal mood and affect.  Intake/Output from previous day: 06/22 0701 - 06/23 0700 In: 670 [I.V.:40; Blood:630] Out: -  Intake/Output this shift: No intake/output data recorded.  Lab Results: Recent Labs    07/25/20 1018 07/25/20 1823 07/26/20 0500  WBC 10.0  --  8.0  HGB 8.1* 8.0* 8.6*  HCT 25.8* 25.8* 27.6*  PLT 337  --  255   BMET Recent Labs    07/25/20 1018 07/26/20 0500  NA 132* 133*  K 3.9 3.9  CL 93* 97*  CO2 30 29  GLUCOSE 90 94  BUN 7* 10  CREATININE 0.75 0.80  CALCIUM 8.7* 8.5*   LFT Recent Labs    07/25/20 1018  PROT 5.8*  ALBUMIN 2.9*  AST 29  ALT 21  ALKPHOS 77  BILITOT 0.4   PT/INR Recent Labs    07/25/20 1823  LABPROT 13.4  INR 1.0    Studies/Results: DG Chest Port 1 View  Result Date:  07/25/2020 CLINICAL DATA:  Possible GI bleed EXAM: PORTABLE CHEST 1 VIEW COMPARISON:  07/16/2020, CT 11/22/2019 FINDINGS: Possible trace pleural effusions. Hazy atelectasis right base. No consolidation. Stable cardiomediastinal silhouette with aortic atherosclerosis. No pneumothorax. IMPRESSION: Slightly low lung volume with possible trace pleural effusion and hazy atelectasis right base Electronically Signed   By: Donavan Foil M.D.   On: 07/25/2020 15:47       Assessment / Plan:    #62 75 year old white female with history of EtOH induced compensated cirrhosis, ongoing EtOH use presenting with progressive anemia and heme positive stool  Etiology of chronic blood loss not clear, she did have EGD and colonoscopy about a year and a half ago per Dr. Lyda Jester.  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 with Dr. Carlean Purl Continue serial hemoglobin was Continue to hold Eliquis  And colonoscopy are unrevealing consider capsule endoscopy   Active Problems:   Symptomatic anemia     LOS: 0 days  Amy Esterwood  PA-C6/23/2022, 9:44 AM     [facility] Attending   I have taken an interval history, reviewed the chart and examined the patient. I agree with the Advanced Practitioner's note, impression and recommendations.   We discussed need for both procedures - she is accepting of need to do colonoscopy and will.  Gatha Mayer, MD, [facility] 07/26/2020 1:56 PM

## 2020-07-26 NOTE — Progress Notes (Signed)
Family Medicine Teaching Service Daily Progress Note Intern Pager: [phone]  Patient name: Desiree [name] Medical record number: [id number] Date of birth: 04-Feb-1946 Age: 75 y.o. Gender: female  Primary Care Provider: Raina Mina., MD Consultants: GI Code Status: DNR  Pt Overview and Major Events to Date:    Assessment and Plan: Desiree [name] is a 75 y.o. female presenting with symptomatic anemia and concern for GI bleed . 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  Anemia GI consulted.  Plan for EGD/Colonoscopy tomorrow.   - Bowel prep this evening - Clear liquid diet, NPO after midnight  A-Fib Currently in Normal Sinus.   - Continue home Carvedilol - Conitnue to hold Eliquis   FEN/GI: Clear Liquid Diet, NPO PPx: SCD's Dispo:Home today. Barriers include .   Subjective:  Patient indicates feeling well this morning.  Indicates has not smoked since 2002.  Objective: Temp:  [98.1 F (36.7 C)-98.7 F (37.1 C)] 98.1 F (36.7 C) (06/23 0739) Pulse Rate:  [86-98] 98 (06/23 0900) Resp:  [16-26] 24 (06/23 0900) BP: (110-155)/(50-96) 147/69 (06/23 0900) SpO2:  [95 %-100 %] 96 % (06/23 0900) Physical Exam:  Physical Exam Constitutional:      General: She is not in acute distress.    Appearance: Normal appearance.  HENT:     Head: Normocephalic and atraumatic.     Mouth/Throat:     Mouth: Mucous membranes are moist.  Cardiovascular:     Rate and Rhythm: Normal rate and regular rhythm.  Pulmonary:     Effort: Pulmonary effort is normal.     Breath sounds: Normal breath sounds.  Abdominal:     General: Abdomen is flat.     Palpations: Abdomen is soft.  Skin:    General: Skin is warm.  Neurological:     General: No focal deficit present.     Mental Status: She is alert.     Laboratory: Recent Labs  Lab 07/25/20 1018 07/25/20 1823 07/26/20 0500  WBC 10.0  --  8.0  HGB 8.1* 8.0*  8.6*  HCT 25.8* 25.8* 27.6*  PLT 337  --  255   Recent Labs  Lab 07/25/20 1018 07/26/20 0500  NA 132* 133*  K 3.9 3.9  CL 93* 97*  CO2 30 29  BUN 7* 10  CREATININE 0.75 0.80  CALCIUM 8.7* 8.5*  PROT 5.8*  --   BILITOT 0.4  --   ALKPHOS 77  --   ALT 21  --   AST 29  --   GLUCOSE 90 94    Imaging/Diagnostic Tests: No new imaging  Delora Fuel, MD 07/26/2020, 10:01 AM PGY-1, [place] Intern pager: [phone], text pages welcome

## 2020-07-26 NOTE — H&P (View-Only) (Signed)
Patient ID: Desiree [name], female   DOB: 10-Sep-1945, 75 y.o.   MRN: [id number]    Progress Note   Subjective   Day # 2  CC; weakness, progressive anemia heme positive stool, in setting of chronic Eliquis  BUN 10/creatinine 0.8 Ferritin 64/iron 36/TIBC 266/sat 14 INR 1.0  Hemoglobin 8.6 today/hematocrit 27.6 post 1 unit  Eliquis on hold  No specific complaints today, did not really remember our conversation about the procedures last night, reviewed again with her today Thus far says her nerves are doing fine No bowel movements or overt bleeding    Objective   Vital signs in last 24 hours: Temp:  [98.1 F (36.7 C)-98.7 F (37.1 C)] 98.1 F (36.7 C) (06/23 0739) Pulse Rate:  [86-98] 98 (06/23 0900) Resp:  [16-26] 24 (06/23 0900) BP: (110-155)/(50-96) 147/69 (06/23 0900) SpO2:  [95 %-100 %] 96 % (06/23 0900)   General:    Elderly female in NAD Heart:  Regular rate and rhythm; no murmurs Lungs: Decreased breath sounds bilaterally, on nasal O2 Abdomen:  Soft, nontender and nondistended. Normal bowel sounds. Extremities:  Without edema. Neurologic:  Alert and oriented,  grossly normal neurologically. Psych:  Cooperative. Normal mood and affect.  Intake/Output from previous day: 06/22 0701 - 06/23 0700 In: 670 [I.V.:40; Blood:630] Out: -  Intake/Output this shift: No intake/output data recorded.  Lab Results: Recent Labs    07/25/20 1018 07/25/20 1823 07/26/20 0500  WBC 10.0  --  8.0  HGB 8.1* 8.0* 8.6*  HCT 25.8* 25.8* 27.6*  PLT 337  --  255   BMET Recent Labs    07/25/20 1018 07/26/20 0500  NA 132* 133*  K 3.9 3.9  CL 93* 97*  CO2 30 29  GLUCOSE 90 94  BUN 7* 10  CREATININE 0.75 0.80  CALCIUM 8.7* 8.5*   LFT Recent Labs    07/25/20 1018  PROT 5.8*  ALBUMIN 2.9*  AST 29  ALT 21  ALKPHOS 77  BILITOT 0.4   PT/INR Recent Labs    07/25/20 1823  LABPROT 13.4  INR 1.0    Studies/Results: DG Chest Port 1 View  Result Date:  07/25/2020 CLINICAL DATA:  Possible GI bleed EXAM: PORTABLE CHEST 1 VIEW COMPARISON:  07/16/2020, CT 11/22/2019 FINDINGS: Possible trace pleural effusions. Hazy atelectasis right base. No consolidation. Stable cardiomediastinal silhouette with aortic atherosclerosis. No pneumothorax. IMPRESSION: Slightly low lung volume with possible trace pleural effusion and hazy atelectasis right base Electronically Signed   By: Donavan Foil M.D.   On: 07/25/2020 15:47       Assessment / Plan:    #61 75 year old white female with history of EtOH induced compensated cirrhosis, ongoing EtOH use presenting with progressive anemia and heme positive stool  Etiology of chronic blood loss not clear, she did have EGD and colonoscopy about a year and a half ago per Dr. Lyda Jester.  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 with Dr. Carlean Purl Continue serial hemoglobin was Continue to hold Eliquis  And colonoscopy are unrevealing consider capsule endoscopy   Active Problems:   Symptomatic anemia     LOS: 0 days  Amy Esterwood  PA-C6/23/2022, 9:44 AM     [facility] Attending   I have taken an interval history, reviewed the chart and examined the patient. I agree with the Advanced Practitioner's note, impression and recommendations.   We discussed need for both procedures - she is accepting of need to do colonoscopy and will.  Gatha Mayer, MD, [facility] 07/26/2020 1:56 PM

## 2020-07-27 ENCOUNTER — Inpatient Hospital Stay (HOSPITAL_COMMUNITY): Payer: Medicare PPO | Admitting: Anesthesiology

## 2020-07-27 ENCOUNTER — Encounter (HOSPITAL_COMMUNITY): Payer: Self-pay | Admitting: Family Medicine

## 2020-07-27 ENCOUNTER — Encounter (HOSPITAL_COMMUNITY)
Admission: EM | Disposition: A | Discharge status: Skilled Nursing Facility | Payer: Self-pay | Source: Ambulatory Visit | Attending: Family Medicine

## 2020-07-27 DIAGNOSIS — K222 Esophageal obstruction: Secondary | ICD-10-CM

## 2020-07-27 DIAGNOSIS — K922 Gastrointestinal hemorrhage, unspecified: Secondary | ICD-10-CM

## 2020-07-27 DIAGNOSIS — C182 Malignant neoplasm of ascending colon: Secondary | ICD-10-CM | POA: Diagnosis present

## 2020-07-27 HISTORY — PX: COLONOSCOPY: SHX5424

## 2020-07-27 HISTORY — PX: BIOPSY: SHX5522

## 2020-07-27 HISTORY — PX: ESOPHAGOGASTRODUODENOSCOPY (EGD) WITH PROPOFOL: SHX5813

## 2020-07-27 HISTORY — PX: SUBMUCOSAL TATTOO INJECTION: SHX6856

## 2020-07-27 LAB — BASIC METABOLIC PANEL
Anion gap: 8 (ref 5–15)
BUN: 5 mg/dL — ABNORMAL LOW (ref 8–23)
CO2: 26 mmol/L (ref 22–32)
Calcium: 8.1 mg/dL — ABNORMAL LOW (ref 8.9–10.3)
Chloride: 100 mmol/L (ref 98–111)
Creatinine, Ser: 0.67 mg/dL (ref 0.44–1.00)
GFR, Estimated: >60 mL/min (ref >60)
Glucose, Bld: 92 mg/dL (ref 70–99)
Potassium: 4 mmol/L (ref 3.5–5.1)
Sodium: 134 mmol/L — ABNORMAL LOW (ref 135–145)

## 2020-07-27 LAB — HEMOGLOBIN AND HEMATOCRIT, BLOOD
HCT: 28.1 % — ABNORMAL LOW (ref 36.0–46.0)
HCT: 29.2 % — ABNORMAL LOW (ref 36.0–46.0)
HCT: 28.3 % — ABNORMAL LOW (ref 36.0–46.0)
Hemoglobin: 8.6 g/dL — ABNORMAL LOW (ref 12.0–15.0)
Hemoglobin: 9.2 g/dL — ABNORMAL LOW (ref 12.0–15.0)
Hemoglobin: 8.8 g/dL — ABNORMAL LOW (ref 12.0–15.0)

## 2020-07-27 LAB — AFP TUMOR MARKER: AFP, Serum, Tumor Marker: 3 ng/mL (ref 0.0–9.2)

## 2020-07-27 LAB — GLUCOSE, CAPILLARY: Glucose-Capillary: 105 mg/dL — ABNORMAL HIGH (ref 70–99)

## 2020-07-27 SURGERY — ESOPHAGOGASTRODUODENOSCOPY (EGD) WITH PROPOFOL
Anesthesia: Monitor Anesthesia Care

## 2020-07-27 MED ORDER — SPOT INK MARKER SYRINGE KIT
PACK | SUBMUCOSAL | Status: DC | PRN
  Administered 2020-07-27: 3.5 mL via SUBMUCOSAL

## 2020-07-27 MED ORDER — PROPOFOL 500 MG/50ML IV EMUL
INTRAVENOUS | Status: DC | PRN
  Administered 2020-07-27: 100 ug/kg/min via INTRAVENOUS

## 2020-07-27 MED ORDER — LACTATED RINGERS IV SOLN: INTRAVENOUS | Status: DC | PRN

## 2020-07-27 MED ORDER — ALBUTEROL SULFATE HFA 108 (90 BASE) MCG/ACT IN AERS
INHALATION_SPRAY | RESPIRATORY_TRACT | Status: DC | PRN
  Administered 2020-07-27: 6 via RESPIRATORY_TRACT

## 2020-07-27 MED ORDER — IRBESARTAN 300 MG PO TABS
600.0000 mg | ORAL_TABLET | Freq: Every day | ORAL | Status: DC
  Administered 2020-07-28 – 2020-08-05 (×8): 600 mg via ORAL
  Filled 2020-07-27 (×10): qty 2

## 2020-07-27 MED ORDER — SPOT INK MARKER SYRINGE KIT
PACK | SUBMUCOSAL | Status: AC
  Filled 2020-07-27: qty 5

## 2020-07-27 MED ORDER — IOHEXOL 9 MG/ML PO SOLN
ORAL | Status: AC
  Administered 2020-07-27: 500 mL via ORAL
  Filled 2020-07-27: qty 1000

## 2020-07-27 MED ORDER — PROPOFOL 10 MG/ML IV BOLUS
INTRAVENOUS | Status: DC | PRN
  Administered 2020-07-27: 10 mg via INTRAVENOUS
  Administered 2020-07-27: 15 mg via INTRAVENOUS
  Administered 2020-07-27: 25 mg via INTRAVENOUS

## 2020-07-27 SURGICAL SUPPLY — 15 items

## 2020-07-27 NOTE — NC FL2 (Signed)
[place] LEVEL OF CARE SCREENING TOOL     IDENTIFICATION  Patient Name: Desiree [name] Birthdate: [birth year] Sex: female Admission Date (Current Location): 07/25/2020  [facility] and [place] Number:  [employer] and Address:  [facility], [place] [address], [place], [place] [address]      Provider Number: [id number]  Attending Physician Name and Address:  Lind Covert, MD  Relative Name and Phone Number:  Jenny Reichmann [phone], daughter    Current Level of Care: Hospital Recommended Level of Care: [place] Prior Approval Number:    Date Approved/Denied:   PASRR Number: [id number]  Discharge Plan: SNF    Current Diagnoses: Patient Active Problem List   Diagnosis Date Noted   Schatzki's ring    Carcinoma of ascending colon [facility])    Gastrointestinal hemorrhage    Cat allergies 07/26/2020   Symptomatic anemia 07/25/2020   Idiopathic peripheral neuropathy 02/23/2020   Benign paroxysmal positional vertigo due to bilateral vestibular disorder 02/09/2020   Cysts of both ovaries 11/24/2019   Other cirrhosis of liver ([place]) 11/24/2019   Early satiety 04/14/2019   Paroxysmal atrial fibrillation ([place]) 04/14/2019   Mild CAD 02/02/2019   Abnormal nuclear stress test    Iron deficiency anemia due to chronic blood loss 10/27/2018   Alcoholism ([place]) 10/21/2018   CKD (chronic kidney disease) stage 3, GFR 30-59 ml/min ([place]) 01/04/2018   Senile purpura ([place]) 01/04/2018   At high risk for falls 07/14/2017   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[place]) 07/14/2017   GERD without esophagitis 07/14/2017   History of pulmonary embolism 07/14/2017   Malaise and fatigue 07/14/2017   Mild episode of recurrent major depressive disorder ([place]) 07/14/2017   Mixed hyperlipidemia 07/14/2017   Prediabetes 07/14/2017   Skin rash 07/14/2017   Vitamin B12 deficiency 07/14/2017   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    Multiple lung nodules on CT 05/16/2016   Osteoporosis 05/16/2016   Centrilobular emphysema ([place]) 05/16/2016   Allergic rhinitis 07/17/2015   Pulmonary embolism ([place]) 11/23/2014   Rheumatoid arthritis(714.0) 09/20/2010   Rheumatoid arthritis ([place]) 09/20/2010   COPD (chronic obstructive pulmonary disease) ([place]) 08/22/2010   Hypertension 08/22/2010    Orientation RESPIRATION BLADDER Height & Weight     Self, Time, Situation, Place  O2 (Nasal cannula 2L) Continent, External catheter Weight: 120 lb (54.4 kg) Height:  5\' 1"  (154.9 cm)  BEHAVIORAL SYMPTOMS/MOOD NEUROLOGICAL BOWEL NUTRITION STATUS      Continent Diet (Please see DC Summary)  AMBULATORY STATUS COMMUNICATION OF NEEDS Skin   Limited Assist Verbally Normal                       Personal Care Assistance Level of Assistance  Bathing, Feeding, Dressing Bathing Assistance: Limited assistance Feeding assistance: Independent Dressing Assistance: Limited assistance     Functional Limitations Info             SPECIAL CARE FACTORS FREQUENCY  PT (By licensed PT), OT (By licensed OT)     PT Frequency: 5x/week OT Frequency: 5x/week            Contractures Contractures Info: Not present    Additional Factors Info  Code Status, Allergies, Psychotropic Code Status Info: DNR Allergies Info: Other Psychotropic Info: Zoloft         Current Medications (07/27/2020):  This is the current hospital active medication list Current Facility-Administered Medications  Medication Dose Route Frequency Provider Last  Rate Last Admin   0.9 %  sodium chloride infusion   Intravenous Continuous Lilland, Alana, DO 100 mL/hr at 07/26/20 1503 Infusion Verify at 07/26/20 1503   albuterol (PROVENTIL) (2.5 MG/3ML) 0.083% nebulizer solution 2.5 mg  2.5 mg Nebulization Q4H PRN Lind Covert, MD   2.5 mg at 07/26/20 0855   amLODipine (NORVASC) tablet 5 mg  5 mg Oral Daily Lilland, Alana, DO   5 mg at 07/27/20 1450   arformoterol  (BROVANA) nebulizer solution 15 mcg  15 mcg Nebulization BID Lind Covert, MD   15 mcg at 07/27/20 8341   And   umeclidinium bromide (INCRUSE ELLIPTA) 62.5 MCG/INH 1 puff  1 puff Inhalation Daily Lind Covert, MD   1 puff at 07/27/20 0905   atorvastatin (LIPITOR) tablet 20 mg  20 mg Oral Daily Lilland, Alana, DO   20 mg at 07/27/20 1450   carvedilol (COREG) tablet 3.125 mg  3.125 mg Oral BID WC Lilland, Alana, DO   3.125 mg at 07/26/20 2051   hydroxychloroquine (PLAQUENIL) tablet 200 mg  200 mg Oral BID Alcus Dad, MD   200 mg at 07/27/20 1451   [START ON 07/28/2020] irbesartan (AVAPRO) tablet 600 mg  600 mg Oral Daily Autry-Lott, Simone, DO       leflunomide (ARAVA) tablet 20 mg  20 mg Oral Daily Lilland, Alana, DO   20 mg at 07/27/20 1452   loratadine (CLARITIN) tablet 10 mg  10 mg Oral Daily Lilland, Alana, DO   10 mg at 07/27/20 1451   montelukast (SINGULAIR) tablet 10 mg  10 mg Oral QHS Lilland, Alana, DO   10 mg at 07/26/20 2053   pantoprazole (PROTONIX) injection 40 mg  40 mg Intravenous Q12H Esterwood, Amy S, PA-C   40 mg at 07/27/20 0930   predniSONE (DELTASONE) tablet 5 mg  5 mg Oral Q breakfast Lilland, Alana, DO   5 mg at 07/27/20 1455   sertraline (ZOLOFT) tablet 100 mg  100 mg Oral Daily Lilland, Alana, DO   100 mg at 07/27/20 1451     Discharge Medications: Please see discharge summary for a list of discharge medications.  Relevant Imaging Results:  Relevant Lab Results:   Additional Information SSN: [id number]. Moderna COVID-19 Vaccine 03/23/2019 , 02/25/2019  Benard Halsted, LCSW

## 2020-07-27 NOTE — Progress Notes (Signed)
Physical Therapy Treatment Patient Details Name: Desiree [name] MRN: [id number] DOB: [birth year] Today's Date: 07/27/2020    History of Present Illness Pt is a 75 y/o female admitted 6/22 secondary to symptomatic anemia. PMH includes cirrhosis, a fib, COPD on 2L and CKD.    PT Comments    Pt progressing towards goals. Increased ambulation distance, however, still limited secondary to fatigue. Oxygen sats dropping to 85 % on 2L, but did not have good pleth. Upon sitting, sats quickly increased back to >90% on 2L. Required min to min guard A for mobility tasks. Daughter present during session and voicing concerns about pt returning to ILF as she lives alone and daughter lives out of town. Updated recommendations to SNF to increase independence and safety at d/c. Will continue to follow acutely.    Follow Up Recommendations  SNF     Equipment Recommendations  None recommended by PT    Recommendations for Other Services       Precautions / Restrictions Precautions Precautions: Fall Precaution Comments: watch O2 Restrictions Weight Bearing Restrictions: No    Mobility  Bed Mobility Overal bed mobility: Needs Assistance Bed Mobility: Supine to Sit;Sit to Supine     Supine to sit: Min guard Sit to supine: Min guard   General bed mobility comments: Min guard for safety    Transfers Overall transfer level: Needs assistance Equipment used: 4-wheeled walker Transfers: Sit to/from Stand Sit to Stand: Min guard         General transfer comment: Cues for hand placement.  Ambulation/Gait Ambulation/Gait assistance: Min guard;Min assist Gait Distance (Feet): 30 Feet Assistive device: 4-wheeled walker Gait Pattern/deviations: Step-through pattern;Decreased stride length Gait velocity: Decreased   General Gait Details: Min guard A initially, however, with increased distance, pt became more fatigued and more unsteady requiring min A for steadying. Pt also reports soreness in  L upper thigh.   Stairs             Wheelchair Mobility    Modified Rankin (Stroke Patients Only)       Balance Overall balance assessment: Needs assistance Sitting-balance support: No upper extremity supported Sitting balance-Leahy Scale: Fair     Standing balance support: Bilateral upper extremity supported Standing balance-Leahy Scale: Poor Standing balance comment: Reliant on BUE support                            Cognition Arousal/Alertness: Awake/alert Behavior During Therapy: WFL for tasks assessed/performed Overall Cognitive Status: Within Functional Limits for tasks assessed                                        Exercises      General Comments General comments (skin integrity, edema, etc.): Oxygen sats dropping to 85 % on 2L, but did not have good pleth. Upon sitting, sats quickly increased back to >90% on 2L.      Pertinent Vitals/Pain Pain Assessment: Faces Faces Pain Scale: Hurts a little bit Pain Location: L upper thigh Pain Descriptors / Indicators: Aching Pain Intervention(s): Limited activity within patient's tolerance;Monitored during session;Repositioned    Home Living                      Prior Function            PT Goals (current goals can now be found in the  care plan section) Acute Rehab PT Goals Patient Stated Goal: to go to SNF to get stronger and then hopefully ALF PT Goal Formulation: With patient/family Time For Goal Achievement: 08/09/20 Potential to Achieve Goals: Fair Progress towards PT goals: Progressing toward goals    Frequency    Min 2X/week      PT Plan Discharge plan needs to be updated;Frequency needs to be updated    Co-evaluation              AM-PAC PT "6 Clicks" Mobility   Outcome Measure  Help needed turning from your back to your side while in a flat bed without using bedrails?: A Little Help needed moving from lying on your back to sitting on the side  of a flat bed without using bedrails?: A Little Help needed moving to and from a bed to a chair (including a wheelchair)?: A Little Help needed standing up from a chair using your arms (e.g., wheelchair or bedside chair)?: A Little Help needed to walk in hospital room?: A Little Help needed climbing 3-5 steps with a railing? : A Lot 6 Click Score: 17    End of Session Equipment Utilized During Treatment: Gait belt Activity Tolerance: Patient limited by fatigue Patient left: in bed;with call bell/phone within reach;with bed alarm set Nurse Communication: Mobility status PT Visit Diagnosis: Unsteadiness on feet (R26.81);Muscle weakness (generalized) (M62.81)     Time: 0981-1914 PT Time Calculation (min) (ACUTE ONLY): 24 min  Charges:  $Gait Training: 8-22 mins $Therapeutic Activity: 8-22 mins                     Lou Miner, DPT  Acute Rehabilitation Services  Pager: [phone] Office: [phone]    Rudean Hitt 07/27/2020, 10:42 AM

## 2020-07-27 NOTE — Anesthesia Preprocedure Evaluation (Signed)
Anesthesia Evaluation  Patient identified by MRN, date of birth, ID band Patient awake    Reviewed: Allergy & Precautions, NPO status , Patient's Chart, lab work & pertinent test results  Airway Mallampati: II  TM Distance: >3 FB     Dental  (+) Teeth Intact, Dental Advisory Given   Pulmonary former smoker,    breath sounds clear to auscultation       Cardiovascular hypertension,  Rhythm:Irregular Rate:Normal     Neuro/Psych    GI/Hepatic   Endo/Other    Renal/GU      Musculoskeletal   Abdominal   Peds  Hematology   Anesthesia Other Findings   Reproductive/Obstetrics                             Anesthesia Physical Anesthesia Plan  ASA: 3  Anesthesia Plan: MAC   Post-op Pain Management:    Induction: Intravenous  PONV Risk Score and Plan: Ondansetron, Dexamethasone and Propofol infusion  Airway Management Planned: Natural Airway and Simple Face Mask  Additional Equipment:   Intra-op Plan:   Post-operative Plan:   Informed Consent: I have reviewed the patients History and Physical, chart, labs and discussed the procedure including the risks, benefits and alternatives for the proposed anesthesia with the patient or authorized representative who has indicated his/her understanding and acceptance.     Dental advisory given  Plan Discussed with: CRNA and Anesthesiologist  Anesthesia Plan Comments:         Anesthesia Quick Evaluation

## 2020-07-27 NOTE — Op Note (Addendum)
[facility] Patient Name: Desiree [name] Procedure Date : 07/27/2020 MRN: [id number] Attending MD: Gatha Mayer , MD Date of Birth: September 11, 1945 CSN: [id number] Age: 75 Admit Type: Inpatient Procedure:                Upper GI endoscopy Indications:              Heme positive stool Providers:                Gatha Mayer, MD, Clyde Lundborg, RN, Fransico Setters                            [place], Technician Referring MD:              Medicines:                Propofol per Anesthesia, Monitored Anesthesia Care Complications:            No immediate complications. Estimated Blood Loss:     Estimated blood loss: none. Procedure:                Pre-Anesthesia Assessment:                           - Prior to the procedure, a History and Physical                            was performed, and patient medications and                            allergies were reviewed. The patient's tolerance of                            previous anesthesia was also reviewed. The risks                            and benefits of the procedure and the sedation                            options and risks were discussed with the patient.                            All questions were answered, and informed consent                            was obtained. Prior Anticoagulants: The patient                            last took Eliquis (apixaban) 2 days prior to the                            procedure. ASA Grade Assessment: III - A patient                            with severe systemic disease. After reviewing the  risks and benefits, the patient was deemed in                            satisfactory condition to undergo the procedure.                           After obtaining informed consent, the endoscope was                            passed under direct vision. Throughout the                            procedure, the patient's blood pressure, pulse, and                             oxygen saturations were monitored continuously. The                            GIF-H190 ([id number] Olympus gastroscope was                            introduced through the mouth, and advanced to the                            second part of duodenum. The upper GI endoscopy was                            accomplished without difficulty. The patient                            tolerated the procedure well. Scope In: Scope Out: Findings:      A non-obstructing Schatzki ring was found at the gastroesophageal       junction.      The exam was otherwise without abnormality.      The cardia and gastric fundus were normal on retroflexion. Impression:               - Non-obstructing Schatzki ring.                           - The examination was otherwise normal.                           - No specimens collected. Recommendation:           - See the other procedure note for documentation of                            additional recommendations. Procedure Code(s):        --- Professional ---                           [phone], Esophagogastroduodenoscopy, flexible,                            transoral; diagnostic, including collection of  specimen(s) by brushing or washing, when performed                            (separate procedure) Diagnosis Code(s):        --- Professional ---                           K22.2, Esophageal obstruction                           R19.5, Other fecal abnormalities CPT copyright 2019 American Medical Association. All rights reserved. The codes documented in this report are preliminary and upon coder review may  be revised to meet current compliance requirements. Gatha Mayer, MD 07/27/2020 12:20:33 PM This report has been signed electronically. Number of Addenda: 0

## 2020-07-27 NOTE — Interval H&P Note (Signed)
History and Physical Interval Note:  07/27/2020 12:17 PM  Desiree [name]  has presented today for surgery, with the diagnosis of Anemia heme positive stool.  The various methods of treatment have been discussed with the patient and family. After consideration of risks, benefits and other options for treatment, the patient has consented to  Procedure(s): ESOPHAGOGASTRODUODENOSCOPY (EGD) WITH PROPOFOL (N/A) COLONOSCOPY (N/A) BIOPSY SUBMUCOSAL TATTOO INJECTION as a surgical intervention.  The patient's history has been reviewed, patient examined, no change in status, stable for surgery.  I have reviewed the patient's chart and labs.  Questions were answered to the patient's satisfaction.     Silvano Rusk

## 2020-07-27 NOTE — Progress Notes (Signed)
Family Medicine Teaching Service Daily Progress Note Intern Pager: [phone]  Patient name: Desiree [name] Medical record number: [id number] Date of birth: 12/10/1945 Age: 75 y.o. Gender: female  Primary Care Provider: Raina Mina., MD Consultants: GI Code Status: DNR  Pt Overview and Major Events to Date:  6/22- Admitted  Assessment and Plan: Desiree [name] is a 75 y.o. female presenting with symptomatic anemia and concern for GI bleed . 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   Symptomatic Anemia  GI Bleed  Melena Returned to baseline of 10-12 yesterday.  This morning is 8.6.  Had bowel prep and currently NPO for EGD/Colonoscopy today.   - GI following, appreciate recs - PT/OT following procedure    Alcohol use  Cirrhosis  AFP is normal at 3.0.     FEN/GI: NPO PPx: SCD's Dispo:Home tomorrow. Barriers include Likely GI bleed.    Subjective:  Patient indicates feeling well this morning.  Objective: Temp:  [98.1 F (36.7 C)-98.5 F (36.9 C)] 98.2 F (36.8 C) (06/24 0401) Pulse Rate:  [88-98] 89 (06/24 0401) Resp:  [17-24] 17 (06/24 0401) BP: (147-177)/(63-83) 155/75 (06/24 0401) SpO2:  [93 %-100 %] 96 % (06/24 0401) Weight:  [58.5 kg] 58.5 kg (06/24 0401) Physical Exam:  Physical Exam Constitutional:      General: She is not in acute distress. HENT:     Head: Normocephalic and atraumatic.     Mouth/Throat:     Mouth: Mucous membranes are moist.  Cardiovascular:     Rate and Rhythm: Normal rate and regular rhythm.  Pulmonary:     Effort: Pulmonary effort is normal.     Breath sounds: Normal breath sounds.  Abdominal:     General: Abdomen is flat.     Palpations: Abdomen is soft.  Skin:    General: Skin is warm.  Neurological:     Mental Status: She is alert.  Psychiatric:        Mood and Affect: Mood normal.        Behavior: Behavior normal.     Laboratory: Recent  Labs  Lab 07/25/20 1018 07/25/20 1823 07/26/20 0500 07/26/20 1258 07/26/20 1806 07/27/20 0205  WBC 10.0  --  8.0  --   --   --   HGB 8.1*   < > 8.6* 10.0* 10.3* 8.6*  HCT 25.8*   < > 27.6* 32.4* 33.0* 28.1*  PLT 337  --  255  --   --   --    < > = values in this interval not displayed.   Recent Labs  Lab 07/25/20 1018 07/26/20 0500 07/27/20 0205  NA 132* 133* 134*  K 3.9 3.9 4.0  CL 93* 97* 100  CO2 30 29 26   BUN 7* 10 5*  CREATININE 0.75 0.80 0.67  CALCIUM 8.7* 8.5* 8.1*  PROT 5.8*  --   --   BILITOT 0.4  --   --   ALKPHOS 77  --   --   ALT 21  --   --   AST 29  --   --   GLUCOSE 90 94 92     Imaging/Diagnostic Tests: No new imaging  Delora Fuel, MD 07/27/2020, 7:34 AM PGY-1, [place] Intern pager: [phone], text pages welcome

## 2020-07-27 NOTE — TOC Initial Note (Signed)
Transition of Care [facility]) - Initial/Assessment Note    Patient Details  Name: Desiree [name] MRN: [id number] Date of Birth: 1945/03/05  [facility]) CM/SW Contact:    Benard Halsted, LCSW Phone Number: 07/27/2020, 3:43 PM  Clinical Narrative:                 CSW received consult for possible SNF placement at time of discharge. CSW spoke with patient. Patient reported that she resides at [place] apartments. Patient expressed understanding of PT recommendation and is agreeable to SNF placement at time of discharge. Patient reports preference for [facility]; CSW requested they review referral. CSW discussed insurance authorization process and provided Medicare SNF ratings list. Patient has received the COVID vaccines. Patient expressed being hopeful for rehab and to feel better soon. No further questions reported at this time.    Expected Discharge Plan: Skilled Nursing Facility Barriers to Discharge: Insurance Authorization, Continued Medical Work up   Patient Goals and CMS Choice Patient states their goals for this hospitalization and ongoing recovery are:: rehab CMS Medicare.gov Compare Post Acute Care list provided to:: Patient Choice offered to / list presented to : Patient  Expected Discharge Plan and Services Expected Discharge Plan: [place] In-house Referral: Clinical Social Work   Post Acute Care Choice: [place] Living arrangements for the past 2 months: [place]                                      Prior Living Arrangements/Services Living arrangements for the past 2 months: [place] Lives with:: Self Patient language and need for interpreter reviewed:: Yes Do you feel safe going back to the place where you live?: Yes      Need for Family Participation in Patient Care: Yes (Comment) Care giver support system in place?: No (comment)   Criminal  Activity/Legal Involvement Pertinent to Current Situation/Hospitalization: No - Comment as needed  Activities of Daily Living      Permission Sought/Granted Permission sought to share information with : Facility Sport and exercise psychologist, Family Supports Permission granted to share information with : Yes, Verbal Permission Granted     Permission granted to share info w AGENCY: SNFs        Emotional Assessment Appearance:: Appears stated age Attitude/Demeanor/Rapport: Engaged, Gracious Affect (typically observed): Accepting, Appropriate, Pleasant Orientation: : Oriented to Self, Oriented to Place, Oriented to  Time, Oriented to Situation Alcohol / Substance Use: Not Applicable Psych Involvement: No (comment)  Admission diagnosis:  Symptomatic anemia [D64.9] Gastrointestinal hemorrhage, unspecified gastrointestinal hemorrhage type [K92.2] Patient Active Problem List   Diagnosis Date Noted   Schatzki's ring    Carcinoma of ascending colon ([place])    Gastrointestinal hemorrhage    Cat allergies 07/26/2020   Symptomatic anemia 07/25/2020   Idiopathic peripheral neuropathy 02/23/2020   Benign paroxysmal positional vertigo due to bilateral vestibular disorder 02/09/2020   Cysts of both ovaries 11/24/2019   Other cirrhosis of liver ([place]) 11/24/2019   Early satiety 04/14/2019   Paroxysmal atrial fibrillation (HCC) 04/14/2019   Mild CAD 02/02/2019   Abnormal nuclear stress test    Iron deficiency anemia due to chronic blood loss 10/27/2018   Alcoholism ([place]) 10/21/2018   CKD (chronic kidney disease) stage 3, GFR 30-59 ml/min (HCC) 01/04/2018   Senile purpura ([place]) 01/04/2018   At high risk for falls 07/14/2017   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[place]) 07/14/2017   GERD without esophagitis 07/14/2017   History of pulmonary embolism 07/14/2017   Malaise and fatigue 07/14/2017   Mild episode of recurrent major depressive disorder ([place]) 07/14/2017   Mixed hyperlipidemia 07/14/2017   Prediabetes  07/14/2017   Skin rash 07/14/2017   Vitamin B12 deficiency 07/14/2017   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   Multiple lung nodules on CT 05/16/2016   Osteoporosis 05/16/2016   Centrilobular emphysema ([place]) 05/16/2016   Allergic rhinitis 07/17/2015   Pulmonary embolism ([place]) 11/23/2014   Rheumatoid arthritis(714.0) 09/20/2010   Rheumatoid arthritis ([place]) 09/20/2010   COPD (chronic obstructive pulmonary disease) ([place]) 08/22/2010   Hypertension 08/22/2010   PCP:  Raina Mina., MD Pharmacy:   [facility], [place] [address] Phone: [phone] Fax: [place], [place] [address] Phone: [phone] Fax: [phone]     Social Determinants of Health (SDOH) Interventions    Readmission Risk Interventions No flowsheet data found.

## 2020-07-27 NOTE — Progress Notes (Addendum)
OT Cancellation Note  Patient Details Name: Desiree [name] MRN: [id number] DOB: Mar 15, 1945   Cancelled Treatment:    Reason Eval/Treat Not Completed: Patient at procedure or test/ unavailable;Other (comment) (working with PT). Plan to reattempt in a bit.  Tyrone Schimke, OT Acute Rehabilitation Services Pager: [phone] Office: [phone]  07/27/2020, 9:57 AM  Addendum: Reattempt made at 12:00. Pt now off unit for procedure. Plan to reattempt at a later date/time.

## 2020-07-27 NOTE — Op Note (Signed)
[facility] Patient Name: Desiree [name] Procedure Date : 07/27/2020 MRN: [id number] Attending MD: Gatha Mayer , MD Date of Birth: [birth year] CSN: [id number] Age: 75 Admit Type: Inpatient Procedure:                Colonoscopy Indications:              Heme positive stool, Anemia Providers:                Gatha Mayer, MD, Clyde Lundborg, RN, Fransico Setters                            Mbumina, Technician Referring MD:              Medicines:                Propofol per Anesthesia, Monitored Anesthesia Care Complications:            No immediate complications. Estimated Blood Loss:      Procedure:                Pre-Anesthesia Assessment:                           - Prior to the procedure, a History and Physical                            was performed, and patient medications and                            allergies were reviewed. The patient's tolerance of                            previous anesthesia was also reviewed. The risks                            and benefits of the procedure and the sedation                            options and risks were discussed with the patient.                            All questions were answered, and informed consent                            was obtained. Prior Anticoagulants: The patient                            last took Eliquis (apixaban) 2 days prior to the                            procedure. ASA Grade Assessment: III - A patient                            with severe systemic disease. After reviewing the  risks and benefits, the patient was deemed in                            satisfactory condition to undergo the procedure.                           After obtaining informed consent, the colonoscope                            was passed under direct vision. Throughout the                            procedure, the patient's blood pressure, pulse, and                            oxygen saturations were  monitored                            continuously.After obtaining informed consent, the                            colonoscope was passed under direct vision.                            Throughout the procedure, the patient's blood                            pressure, pulse, and oxygen saturations were                            monitored continuously. The PCF-[id number] ([id number])                            Olympus pediatric colonscope was introduced through                            the anus and advanced to the the cecum, identified                            by appendiceal orifice and ileocecal valve. The                            colonoscopy was performed without difficulty. The                            patient tolerated the procedure well. The quality                            of the bowel preparation was good. The ileocecal                            valve, appendiceal orifice, and rectum were  photographed. Scope In: 11:45:46 AM Scope Out: 11:58:15 AM Scope Withdrawal Time: 0 hours 6 minutes 30 seconds  Total Procedure Duration: 0 hours 12 minutes 29 seconds  Findings:      The perianal and digital rectal examinations were normal.      A non-obstructing large mass was found in the ascending colon. The mass       was non-circumferential. This was biopsied with a cold forceps for       histology. Biopsies for histology were taken with a cold forceps for       evaluation of microscopic colitis. Verification of patient       identification for the specimen was done. Estimated blood loss was       minimal. Area was tattooed with an injection of 3 mL of Spot (carbon       black).      The exam was otherwise without abnormality on direct and retroflexion       views except for mild sigmoid diverticulosis Impression:               - Malignant tumor in the ascending colon. Biopsied.                            Tattooed.                           - The examination  was otherwise normal on direct                            and retroflexion views. Recommendation:           - Return patient to hospital ward for ongoing care.                           - CONSULT CCS                           I ORDERED CT CHEST/ABD PELVIS                           LEAVE OFF ELIQUIS FOR NOW                           I ORDERED CEA FOR AM                           STAY ON CLEAR LIQS UNTIL SURGERY PLANS KNOWN Procedure Code(s):        --- Professional ---                           [phone], Colonoscopy, flexible; with directed                            submucosal injection(s), any substance                           62836, Colonoscopy, flexible; with biopsy, single  or multiple Diagnosis Code(s):        --- Professional ---                           C18.2, Malignant neoplasm of ascending colon                           R19.5, Other fecal abnormalities                           D64.9, Anemia, unspecified CPT copyright 2019 American Medical Association. All rights reserved. The codes documented in this report are preliminary and upon coder review may  be revised to meet current compliance requirements. Gatha Mayer, MD 07/27/2020 12:25:07 PM This report has been signed electronically. Number of Addenda: 0

## 2020-07-27 NOTE — Anesthesia Procedure Notes (Signed)
Procedure Name: MAC Date/Time: 07/27/2020 11:35 AM Performed by: Renato Shin, CRNA Pre-anesthesia Checklist: Emergency Drugs available, Suction available and Patient being monitored Patient Re-evaluated:Patient Re-evaluated prior to induction Oxygen Delivery Method: Nasal cannula Preoxygenation: Pre-oxygenation with 100% oxygen Induction Type: IV induction Airway Equipment and Method: Bite block Placement Confirmation: breath sounds checked- equal and bilateral and positive ETCO2 Dental Injury: Teeth and Oropharynx as per pre-operative assessment

## 2020-07-27 NOTE — Transfer of Care (Signed)
Immediate Anesthesia Transfer of Care Note  Patient: Mesa Janus  Procedure(s) Performed: ESOPHAGOGASTRODUODENOSCOPY (EGD) WITH PROPOFOL COLONOSCOPY BIOPSY SUBMUCOSAL TATTOO INJECTION  Patient Location: PACU  Anesthesia Type:MAC  Level of Consciousness: awake and patient cooperative  Airway & Oxygen Therapy: Patient Spontanous Breathing and Patient connected to face mask oxygen  Post-op Assessment: Report given to RN and Post -op Vital signs reviewed and stable  Post vital signs: Reviewed and stable  Last Vitals:  Vitals Value Taken Time  BP 94/46 07/27/20 1206  Temp    Pulse 93 07/27/20 1206  Resp 21 07/27/20 1206  SpO2 100 % 07/27/20 1206  Vitals shown include unvalidated device data.  Last Pain:  Vitals:   07/27/20 1040  TempSrc: Temporal  PainSc: 0-No pain         Complications: No notable events documented.

## 2020-07-27 NOTE — Anesthesia Postprocedure Evaluation (Signed)
Anesthesia Post Note  Patient: Desiree [name]  Procedure(s) Performed: ESOPHAGOGASTRODUODENOSCOPY (EGD) WITH PROPOFOL COLONOSCOPY BIOPSY SUBMUCOSAL TATTOO INJECTION     Patient location during evaluation: Endoscopy Anesthesia Type: MAC Level of consciousness: awake and alert Pain management: pain level controlled Vital Signs Assessment: post-procedure vital signs reviewed and stable Respiratory status: spontaneous breathing, nonlabored ventilation, respiratory function stable and patient connected to nasal cannula oxygen Cardiovascular status: stable and blood pressure returned to baseline Postop Assessment: no apparent nausea or vomiting Anesthetic complications: no   No notable events documented.  Last Vitals:  Vitals:   07/27/20 1225 07/27/20 1305  BP: (!) 140/40 (!) 133/54  Pulse: 89 98  Resp: 19 18  Temp:  36.7 C  SpO2: 100% 100%    Last Pain:  Vitals:   07/27/20 1305  TempSrc: Oral  PainSc:                  Camaya [employer] COKER

## 2020-07-28 ENCOUNTER — Inpatient Hospital Stay (HOSPITAL_COMMUNITY): Payer: Medicare PPO

## 2020-07-28 ENCOUNTER — Other Ambulatory Visit (HOSPITAL_COMMUNITY): Payer: Medicare PPO

## 2020-07-28 DIAGNOSIS — C182 Malignant neoplasm of ascending colon: Principal | ICD-10-CM

## 2020-07-28 DIAGNOSIS — Z9889 Other specified postprocedural states: Secondary | ICD-10-CM

## 2020-07-28 DIAGNOSIS — M069 Rheumatoid arthritis, unspecified: Secondary | ICD-10-CM

## 2020-07-28 LAB — HEMOGLOBIN AND HEMATOCRIT, BLOOD
HCT: 27.9 % — ABNORMAL LOW (ref 36.0–46.0)
HCT: 30.7 % — ABNORMAL LOW (ref 36.0–46.0)
HCT: 27.1 % — ABNORMAL LOW (ref 36.0–46.0)
Hemoglobin: 9 g/dL — ABNORMAL LOW (ref 12.0–15.0)
Hemoglobin: 9.3 g/dL — ABNORMAL LOW (ref 12.0–15.0)
Hemoglobin: 8.6 g/dL — ABNORMAL LOW (ref 12.0–15.0)

## 2020-07-28 LAB — BODY FLUID CELL COUNT WITH DIFFERENTIAL
Eos, Fluid: 0 %
Lymphs, Fluid: 22 %
Monocyte-Macrophage-Serous Fluid: 22 % — ABNORMAL LOW (ref 50–90)
Neutrophil Count, Fluid: 56 % — ABNORMAL HIGH (ref 0–25)
Total Nucleated Cell Count, Fluid: 2228 cu mm — ABNORMAL HIGH (ref 0–1000)

## 2020-07-28 LAB — LACTATE DEHYDROGENASE, PLEURAL OR PERITONEAL FLUID: LD, Fluid: 72 U/L — ABNORMAL HIGH (ref 3–23)

## 2020-07-28 LAB — PROTEIN, PLEURAL OR PERITONEAL FLUID: Total protein, fluid: <3 g/dL

## 2020-07-28 MED ORDER — ACETAMINOPHEN 10 MG/ML IV SOLN
1000.0000 mg | Freq: Once | INTRAVENOUS | Status: AC
  Administered 2020-07-28: 1000 mg via INTRAVENOUS
  Filled 2020-07-28: qty 100

## 2020-07-28 MED ORDER — IOHEXOL 300 MG/ML  SOLN
75.0000 mL | Freq: Once | INTRAMUSCULAR | Status: AC | PRN
  Administered 2020-07-28: 75 mL via INTRAVENOUS

## 2020-07-28 NOTE — Consult Note (Signed)
NAME:  Desiree [name], MRN:  [id number], DOB:  04-11-1945, LOS: 2 ADMISSION DATE:  07/25/2020, CONSULTATION DATE:  07/28/20 REFERRING MD:  Erin Hearing, CHIEF COMPLAINT:  fatigue   History of Present Illness:  75 year old woman with hx of PE (2017) on AC, COPD on HOT, HTN, RA on DMARDs, alcohol use with question of cirrhosis presenting with fatigue, melena found to have anemia.  Further workup revealed an ascending colon mass.  Staging scan showed enlarging LLL pulmonary nodule that Dr. Valeta Harms knows about and is followed, new R>L pleural effusion, GB sludge vs. Mass.  PCCM consulted for further workup.  Currently MMRC 1 dyspnea, strength improving since admission with breathing treatments. Has some vague R shoulder pain over last several days. 40 pack year smoker. Daughter at bedside.   Pertinent  Medical History  PE/Afib  Significant Hospital Events: Including procedures, antibiotic start and stop dates in addition to other pertinent events   6/23 admitted 6/24 EGD/colonoscopy  Interim History / Subjective:  Consulted  Objective   Blood pressure (!) 143/74, pulse 95, temperature 98.1 F (36.7 C), temperature source Oral, resp. rate 19, height 5\' 1"  (1.549 m), weight 53.9 kg, SpO2 97 %.    FiO2 (%):  [28 %] 28 %   Intake/Output Summary (Last 24 hours) at 07/28/2020 1326 Last data filed at 07/28/2020 9983 Gross per 24 hour  Intake 3288.47 ml  Output 1300 ml  Net 1988.47 ml   Filed Weights   07/27/20 0401 07/27/20 1040 07/28/20 0500  Weight: 58.5 kg 54.4 kg 53.9 kg    Examination: General: no acute distress HENT: MMM, trachea midline Lungs: diminished bilaterally c/w emphysema, no accessory muscle use, small R effusion on [place] Cardiovascular: RRR, ext warm Abdomen: soft, +BS Extremities: no edema Neuro: moves all 4 ext to command Psych: RASS 0  Labs/imaging that I have personally reviewed  (right click and "Reselect all SmartList Selections" daily)  Plts okay INR  normal BMP mild hyponatremia Albumin mildly low CT C/A/P reviewed personally  Resolved Hospital Problem list   N/a  Assessment & Plan:  Likely MAI RML New R pleural effusion Abnormal GB appearance on CT New ascending colon mass causing symptomatic anemia Enlarging LLL nodule c/w primary bronchogenic carcinoma COPD not in flare Hx EtOH use- I do not think she has cirrhosis looking at imaging and labs HFPEF/HFrEF hx RA on DMARDs  - f/u RUQ [place] - tap R effusion for diagnostic purposes but low suspicion for malignancy - left nodule will eventually need biopsy and probably SBRT but do not think this should delay colon mass resection if offered - check C spine imaging for atlantoaxial stability given RA history (last imaging 2011); this helps with anesthesia planning - no further pulmonary testing needs to be done prior to OR if she is offered this - will f/u on pleural fluid labs and I will touch base with Dr. Valeta Harms regarding timing of ENB/biopsy which will likely be after she recovers from partial colectomy - continue PTA nebs   Best Practice (right click and "Reselect all SmartList Selections" daily)   Per primary   Labs   CBC: Recent Labs  Lab 07/25/20 1018 07/25/20 1823 07/26/20 0500 07/26/20 1258 07/27/20 0205 07/27/20 1023 07/27/20 1851 07/28/20 0148 07/28/20 0903  WBC 10.0  --  8.0  --   --   --   --   --   --   HGB 8.1*   < > 8.6*   < > 8.6* 9.2*  8.8* 9.0* 9.3*  HCT 25.8*   < > 27.6*   < > 28.1* 29.2* 28.3* 27.9* 30.7*  MCV 104.0*  --  94.8  --   --   --   --   --   --   PLT 337  --  255  --   --   --   --   --   --    < > = values in this interval not displayed.    Basic Metabolic Panel: Recent Labs  Lab 07/25/20 1018 07/26/20 0500 07/27/20 0205  NA 132* 133* 134*  K 3.9 3.9 4.0  CL 93* 97* 100  CO2 30 29 26   GLUCOSE 90 94 92  BUN 7* 10 5*  CREATININE 0.75 0.80 0.67  CALCIUM 8.7* 8.5* 8.1*   GFR: Estimated Creatinine Clearance: 44.4 mL/min (by  C-G formula based on SCr of 0.67 mg/dL). Recent Labs  Lab 07/25/20 1018 07/26/20 0500  WBC 10.0 8.0    Liver Function Tests: Recent Labs  Lab 07/25/20 1018  AST 29  ALT 21  ALKPHOS 77  BILITOT 0.4  PROT 5.8*  ALBUMIN 2.9*   Recent Labs  Lab 07/25/20 1823  LIPASE 85*   No results for input(s): AMMONIA in the last 168 hours.  ABG No results found for: PHART, PCO2ART, PO2ART, HCO3, TCO2, ACIDBASEDEF, O2SAT   Coagulation Profile: Recent Labs  Lab 07/25/20 1823  INR 1.0    Cardiac Enzymes: No results for input(s): CKTOTAL, CKMB, CKMBINDEX, TROPONINI in the last 168 hours.  HbA1C: No results found for: HGBA1C  CBG: Recent Labs  Lab 07/27/20 1635  GLUCAP 105*    Review of Systems:    Positive Symptoms in bold:  Constitutional fevers, chills, weight loss, fatigue, anorexia, malaise  Eyes decreased vision, double vision, eye irritation  Ears, Nose, Mouth, Throat sore throat, trouble swallowing, sinus congestion  Cardiovascular chest pain, paroxysmal nocturnal dyspnea, lower ext edema, palpitations   Respiratory SOB, cough, DOE, hemoptysis, wheezing  Gastrointestinal nausea, vomiting, diarrhea  Genitourinary burning with urination, trouble urinating  Musculoskeletal joint aches, joint swelling, back pain  Integumentary  rashes, skin lesions  Neurological focal weakness, focal numbness, trouble speaking, headaches  Psychiatric depression, anxiety, confusion  Endocrine polyuria, polydipsia, cold intolerance, heat intolerance  Hematologic abnormal bruising, abnormal bleeding, unexplained nose bleeds  Allergic/Immunologic recurrent infections, hives, swollen lymph nodes     Past Medical History:  She,  has a past medical history of Carcinoma of ascending colon (HCC), Cat allergies, COPD (chronic obstructive pulmonary disease) ([place]), Hypertension, Osteoporosis, Pulmonary embolism ([place]) (2017), and Rheumatoid arthritis(714.0).   Surgical History:   Past  Surgical History:  Procedure Laterality Date   CYSTECTOMY Right    breast   LEFT HEART CATH AND CORONARY ANGIOGRAPHY N/A 01/05/2019   Procedure: LEFT HEART CATH AND CORONARY ANGIOGRAPHY;  Surgeon: Jettie Booze, MD;  Location: [facility];  Service: Cardiovascular;  Laterality: N/A;     Social History:   reports that she quit smoking about 19 years ago. Her smoking use included cigarettes. She has a 40.00 pack-year smoking history. She has never used smokeless tobacco. She reports current alcohol use of about 2.0 standard drinks of alcohol per week. She reports that she does not use drugs.   Family History:  Her family history includes Breast cancer in her maternal aunt; Clotting disorder in her mother; Emphysema in her paternal grandmother and sister; Heart disease in her father and sister; Sleep apnea in her sister;  Stroke in her mother.   Allergies Allergies  Allergen Reactions   Other Shortness Of Breath    Cats     Home Medications  Prior to Admission medications   Medication Sig Start Date End Date Taking? Authorizing Provider  acetaminophen (TYLENOL) 500 MG tablet Take 500 mg by mouth every 6 (six) hours as needed for moderate pain or headache.   Yes [provider]  albuterol (VENTOLIN HFA) 108 (90 Base) MCG/ACT inhaler Inhale 2 puffs into the lungs every 4 (four) hours as needed for wheezing or shortness of breath.   Yes [provider]  amLODipine (NORVASC) 5 MG tablet Take 1 tablet (5 mg total) by mouth daily. 03/19/20  Yes Tobb, Kardie, DO  apixaban (ELIQUIS) 2.5 MG TABS tablet Take 1 tablet (2.5 mg total) by mouth 2 (two) times daily. 07/23/20  Yes Tobb, Kardie, DO  atorvastatin (LIPITOR) 20 MG tablet Take 1 tablet (20 mg total) by mouth daily. 06/22/20  Yes Tobb, Kardie, DO  Budeson-Glycopyrrol-Formoterol (BREZTRI AEROSPHERE) 160-9-4.8 MCG/ACT AERO Inhale 2 puffs into the lungs 2 (two) times daily. 01/19/20  Yes Icard, Octavio Graves, DO  carvedilol  (COREG) 3.125 MG tablet Take 1 tablet (3.125 mg total) by mouth 2 (two) times daily with a meal. 05/28/20  Yes Tobb, Kardie, DO  cholecalciferol (VITAMIN D) 25 MCG (1000 UT) tablet Take 1,000 Units by mouth daily.    Yes [provider]  ferrous sulfate 325 (65 FE) MG EC tablet Take 325 mg by mouth daily with breakfast.   Yes [provider]  fexofenadine (ALLEGRA) 180 MG tablet Take 180 mg by mouth daily.   Yes [provider]  hydroxychloroquine (PLAQUENIL) 200 MG tablet Take 200 mg by mouth 2 (two) times daily.  09/17/15  Yes [provider]  leflunomide (ARAVA) 20 MG tablet Take 20 mg by mouth daily.   Yes [provider]  montelukast (SINGULAIR) 10 MG tablet Take 1 tablet (10 mg total) by mouth at bedtime. Patient taking differently: Take 10 mg by mouth daily. 05/08/20  Yes Icard, Octavio Graves, DO  omeprazole (PRILOSEC) 20 MG capsule Take 1 capsule (20 mg total) by mouth daily. 07/11/20  Yes Icard, Octavio Graves, DO  predniSONE (DELTASONE) 5 MG tablet Take 5 mg by mouth daily with breakfast.   Yes [provider]  sertraline (ZOLOFT) 100 MG tablet Take 100 mg by mouth daily.  07/27/10  Yes [provider]  valsartan (DIOVAN) 320 MG tablet Take 320 mg by mouth daily.   Yes [provider]  vitamin B-12 (CYANOCOBALAMIN) 1000 MCG tablet Take 1,000 mcg by mouth daily.    Yes [provider]  budesonide-formoterol (SYMBICORT) 160-4.5 MCG/ACT inhaler Inhale 2 puffs into the lungs every 12 (twelve) hours. Patient not taking: No sig reported 12/01/19   Parrett, Fonnie Mu, NP  Tiotropium Bromide Monohydrate (SPIRIVA RESPIMAT) 2.5 MCG/ACT AERS Inhale 2 puffs into the lungs daily. Patient not taking: No sig reported 11/28/19   Lauraine Rinne, NP

## 2020-07-28 NOTE — Progress Notes (Signed)
   Patient Name: Desiree [name] Date of Encounter: 07/28/2020, 10:25 AM    Subjective  Path pending  CT done   Objective  BP (!) 158/82 (BP Location: Right Arm)   Pulse 96   Temp 98.1 F (36.7 C) (Oral)   Resp 20   Ht 5\' 1"  (1.549 m)   Wt 53.9 kg   SpO2 96%   BMI 22.45 kg/m    CBC Latest Ref Rng & Units 07/28/2020 07/28/2020 07/27/2020  WBC 4.0 - 10.5 K/uL - - -  Hemoglobin 12.0 - 15.0 g/dL 9.3(L) 9.0(L) 8.8(L)  Hematocrit 36.0 - 46.0 % 30.7(L) 27.9(L) 28.3(L)  Platelets 150 - 400 K/uL - - -   IMPRESSION: Circumferential focal thickening involving the ascending colon likely representing the patient's known primary malignancy. No evidence of metastatic disease within the chest, abdomen, and pelvis.   Progressive enlargement of a bilobed pulmonary nodule within the left lower lobe demonstrating slow but progressive enlargement since remote PET CT of 09/09/2017 likely representing an indolent primary bronchogenic neoplasm.   Interval development of small to moderate right pleural effusion. Associated progressive bronchial wall thickening in keeping with airway inflammation. Together, these findings may reflect changes of recent infection. No superimposed focal pulmonary infiltrate.   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   Mild centrilobular emphysema.   Chronic right glenohumeral posterior dislocation with associated large right effusion.   Moderate distal colonic diverticulosis. No superimposed focal inflammatory change.   12 mm enhancing nodule within the gallbladder fundus. This may represent a mural mass or gallbladder polyp. Dedicated sonography is recommended for further evaluation.   Stable bilateral ovarian cysts, metabolically negative on prior PET CT examination of  09/09/2017, safely considered benign given their stability over time. No follow-up imaging recommended. Note: This recommendation does not apply to premenarchal patients and to those with increased risk (genetic, family history, elevated tumor markers or other high-risk factors) of ovarian cancer. Reference: JACR 2020 Feb; 17(2):248-254   Advanced degenerative change within the lumbar spine, asymmetrically more severe at L2-3 with resultant moderate central canal stenosis and bilateral neuroforaminal narrowing, not optimally profiled on this examination. This could be better assessed with MRI examination, if indicated.   Aortic Atherosclerosis (ICD10-I70.0) and Emphysema (ICD10-J43.9).     Electronically Signed   By: Fidela Salisbury MD   On: 07/28/2020 01:31    Assessment and Plan  Ascending colon cancer (suspected - bx pending) Enlarging lung nodule suspect primary lung cancer Gallbladder lesion ? Polyp No hard evidence colon mets   Awaiting [facility] consult Needs US GB I think pulm consult (sees Icard) makes sense  We will be available if needed  Gatha Mayer, MD, [facility] 07/28/2020 10:25 AM

## 2020-07-28 NOTE — Consult Note (Addendum)
Consulting Physician: Nickola Major Ianna Salmela  Referring Provider: Dr. Posey Pronto - Internal Medicine  Chief Complaint: Fatigue  Reason for Consult: Ascending colon mass   Subjective   HPI: Desiree [name] is an 75 y.o. female who is here for symptomatic anemia found on colonoscopy to have a malignant appearing ascending colon mass.  General surgery was consulted.    She was sent to the hospital by her PCP due to melana and anemia.  She had symptoms of fatigue, tiredness, dizziness when standing, light headedness.  She drinks alcohol daily.  She has had fecal incontinence.  She has had multiple recent falls.    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[place])    Cat allergies    COPD (chronic obstructive pulmonary disease) ([place])    Hypertension    Osteoporosis    Pulmonary embolism ([place]) 2017   Rheumatoid arthritis(714.0)    Dr Tobie Lords follows    Past Surgical History:  Procedure Laterality Date   CYSTECTOMY Right    breast   LEFT HEART CATH AND CORONARY ANGIOGRAPHY N/A 01/05/2019   Procedure: LEFT HEART CATH AND CORONARY ANGIOGRAPHY;  Surgeon: Jettie Booze, MD;  Location: [facility];  Service: Cardiovascular;  Laterality: N/A;    Family History  Problem Relation Age of Onset   Heart disease Father    Stroke Mother    Clotting disorder Mother    Heart disease Sister    Sleep apnea Sister    Emphysema Sister    Breast cancer Maternal Aunt    Emphysema Paternal Grandmother     Social:  reports that she quit smoking about 19 years ago. Her smoking use included cigarettes. She has a 40.00 pack-year smoking history. She has never used smokeless tobacco. She reports current alcohol use of about 2.0 standard drinks of alcohol per week. She reports that she does not use drugs.  Allergies:  Allergies  Allergen Reactions   Other Shortness Of Breath    Cats    Medications: Current Outpatient Medications  Medication  Instructions   acetaminophen (TYLENOL) 500 mg, Oral, Every 6 hours PRN   albuterol (VENTOLIN HFA) 108 (90 Base) MCG/ACT inhaler 2 puffs, Inhalation, Every 4 hours PRN   amLODipine (NORVASC) 5 mg, Oral, Daily   apixaban (ELIQUIS) 2.5 mg, Oral, 2 times daily   atorvastatin (LIPITOR) 20 mg, Oral, Daily   Budeson-Glycopyrrol-Formoterol (BREZTRI AEROSPHERE) 160-9-4.8 MCG/ACT AERO 2 puffs, Inhalation, 2 times daily   budesonide-formoterol (SYMBICORT) 160-4.5 MCG/ACT inhaler 2 puffs, Inhalation, Every 12 hours   carvedilol (COREG) 3.125 mg, Oral, 2 times daily with meals   cholecalciferol (VITAMIN D) 1,000 Units, Oral, Daily   ferrous sulfate 325 mg, Oral, Daily with breakfast   fexofenadine (ALLEGRA) 180 mg, Oral, Daily   hydroxychloroquine (PLAQUENIL) 200 mg, Oral, 2 times daily   leflunomide (ARAVA) 20 mg, Oral, Daily   montelukast (SINGULAIR) 10 mg, Oral, Daily at bedtime   omeprazole (PRILOSEC) 20 mg, Oral, Daily   predniSONE (DELTASONE) 5 mg, Oral, Daily with breakfast   sertraline (ZOLOFT) 100 mg, Oral, Daily   Tiotropium Bromide Monohydrate (SPIRIVA RESPIMAT) 2.5 MCG/ACT AERS 2 puffs, Inhalation, Daily   valsartan (DIOVAN) 320 mg, Oral, Daily   vitamin B-12 (CYANOCOBALAMIN) 1,000 mcg, Oral, Daily    ROS - all of the below systems have been reviewed with the patient and positives are indicated with bold text General: chills, fever or night sweats Eyes: blurry vision or double vision ENT:  epistaxis or sore throat Allergy/Immunology: itchy/watery eyes or nasal congestion Hematologic/Lymphatic: bleeding problems, blood clots or swollen lymph nodes Endocrine: temperature intolerance or unexpected weight changes Breast: new or changing breast lumps or nipple discharge Resp: cough, shortness of breath, or wheezing CV: chest pain or dyspnea on exertion GI: as per HPI GU: dysuria, trouble voiding, or hematuria MSK: joint pain or joint stiffness Neuro: TIA or stroke symptoms Derm:  pruritus and skin lesion changes Psych: anxiety and depression  Objective   PE Blood pressure (!) 149/74, pulse 92, temperature 98.3 F (36.8 C), temperature source Oral, resp. rate 19, height 5\' 1"  (1.549 m), weight 53.9 kg, SpO2 97 %. Constitutional: NAD; conversant; no deformities, Eyes: Moist conjunctiva; no lid lag; anicteric; PERRL Neck: Trachea midline; no thyromegaly Lungs: Normal respiratory effort; no tactile fremitus CV: RRR; no palpable thrills; no pitting edema GI: Abd Soft, non-tender; no palpable hepatosplenomegaly MSK: Normal range of motion of extremities; no clubbing/cyanosis Psychiatric: Appropriate affect; alert and oriented x3 Lymphatic: No palpable cervical or axillary lymphadenopathy  Results for orders placed or performed during the hospital encounter of 07/25/20 (from the past 24 hour(s))  Hemoglobin and hematocrit, blood     Status: Abnormal   Collection Time: 07/27/20  6:51 PM  Result Value Ref Range   Hemoglobin 8.8 (L) 12.0 - 15.0 g/dL   HCT 28.3 (L) 36.0 - 46.0 %  Hemoglobin and hematocrit, blood     Status: Abnormal   Collection Time: 07/28/20  1:48 AM  Result Value Ref Range   Hemoglobin 9.0 (L) 12.0 - 15.0 g/dL   HCT 27.9 (L) 36.0 - 46.0 %  Hemoglobin and hematocrit, blood     Status: Abnormal   Collection Time: 07/28/20  9:03 AM  Result Value Ref Range   Hemoglobin 9.3 (L) 12.0 - 15.0 g/dL   HCT 30.7 (L) 36.0 - 46.0 %  Protein, pleural or peritoneal fluid     Status: None   Collection Time: 07/28/20  2:21 PM  Result Value Ref Range   Total protein, fluid <3.0 g/dL   Fluid Type-FTP PLEURAL   Lactate dehydrogenase (pleural or peritoneal fluid)     Status: Abnormal   Collection Time: 07/28/20  2:21 PM  Result Value Ref Range   LD, Fluid 72 (H) 3 - 23 U/L   Fluid Type-FLDH PLEURAL      Imaging Orders  DG Chest Port 1 View  CT CHEST ABDOMEN PELVIS W CONTRAST  US Abdomen Limited RUQ (LIVER/GB)  DG Chest 1 View  DG Cervical Spine With  Flex & Extend    Assessment and Plan   Desiree [name] is an 75 y.o. female with symptomatic anemia likely 2/2 a malignant appearing ascending colon mass.  Ascending colon mass Biopsy pending, likely colon adenocarcinoma CEA pending CT with no evidence of metastatic spread Once biopsy results return we will work to schedule surgery Would be helpful to keep on liquid diet and hold eliquis if able to schedule surgery early this week to avoid a second bowel prep.  Pulmoanry mass assessed by pulmonary team, does not appear related to colon cancer and pulmonary team does not feel colon surgery should be delayed for further evaluation at this time  Abnormal appearance of gallbladder on CT May be phrygian cap, RUQ ultrasound pending, can also assess intra-op, does not appear to be having gallbladder symptoms  Iron deficiency anemia 2/2 mass, plan as above Monitor Hgb, transfuse if needed  Alcohol abuse LFTs, platelets, bilirubin, INR normal, does  not appear to have cirrhosis on CT  Atrial fibrillation If anticoagulation is restarted, would start with heparin ggt while inpatient to avoid scheduling issues with surgery.  Hold eliquis for now.  Remainder of care per primary team  Called and discussed care with daughter  Felicie Morn, [facility] Use [email or web address] to contact on call provider

## 2020-07-28 NOTE — Hospital Course (Addendum)
Desiree [name] is a 75 y.o. female who presented with symptomatic anemia and concern for GI bleed. 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  Symptomatic Anemia  GI Bleed  Melena Patient admitted with symptomatic anemia and persistent melena with hemoglobin of 8.2 on admission.  Patient was transfused 1 unit PRBC and home Eliquis was held.  GI was consulted and patient underwent colonoscopy on 6/23 which showed a likely malignant mass in the ascending colon.  Biopsy showed atypia suspicious for adenocarcinoma.  CEA elevated at 6.8.  CT chest, abdomen and pelvis showed no concern for metastasis but have several other findings which are described in the problems below. Patient underwent right hemicolectomy on 6/28. She started eating POD #2 with intermittent vomiting on POD #2 and 3. Producing urine, BM, and flatus. Zofran provided, improved nausea and ability to eat. By day of discharge, able to tolerate meals without zofran, so long as she is up in chair to eat.  Pulmonary mass, possible neoplasm CT chest, abdomen and pelvis showed progressive enlargement of a bilobed pulmonary nodule within the LLL demonstrating slow but progressive enlargement since remote PET CT of 09/09/2017 likely representing an indolent primary bronchogenic neoplasm. Pulmonology consulted who recommended thoracentesis for the right pleural effusion, which grew coagulase negative staph. Patient treated with Ceftriaxone followed by Cefdinir for a 7 day total course. Needs outpatient pulm follow up for possible biopsy.    Gallbladder mass CT chest, abdomen and pelvis showed a 12 mm enhancing nodule within the gallbladder fundus, possible mural mass or gallbladder polyp. RUQ [place] was obtained and showed small hypoechoic area that could reflect sludge vs mass. The gallbladder was inspected intra-operatively during right hemicolectomy and was normal in appearance.  Could consider repeat ultrasound in 6 weeks.  Chronic R Shoulder Dislocation Noted on CT chest/abdomen/pelvis. Patient does not have shoulder pain or functional limitations. Will recommend outpatient orthopedics for follow up, eval and treat.

## 2020-07-28 NOTE — Procedures (Signed)
Thoracentesis  Procedure Note  Desiree [name]  [id number]  May 26, 1945  Date:07/28/20  Time:2:53 PM   Provider Performing:Rontavious Albright C Tamala Julian   Procedure: Thoracentesis with imaging guidance (85992)  Indication(s) Pleural Effusion  Consent Risks of the procedure as well as the alternatives and risks of each were explained to the patient and/or caregiver.  Consent for the procedure was obtained and is signed in the bedside chart  Anesthesia Topical only with 1% lidocaine    Time Out Verified patient identification, verified procedure, site/side was marked, verified correct patient position, special equipment/implants available, medications/allergies/relevant history reviewed, required imaging and test results available.   Sterile Technique Maximal sterile technique including full sterile barrier drape, hand hygiene, sterile gown, sterile gloves, mask, hair covering, sterile ultrasound probe cover (if used).  Procedure Description Ultrasound was used to identify appropriate pleural anatomy for placement and overlying skin marked.  Area of drainage cleaned and draped in sterile fashion. Lidocaine was used to anesthetize the skin and subcutaneous tissue.  200 cc's of straw appearing fluid was drained from the right pleural space. Catheter then removed and bandaid applied to site.   Complications/Tolerance None; patient tolerated the procedure well. Chest X-ray is ordered to confirm no post-procedural complication.   EBL Minimal   Specimen(s) Pleural fluid

## 2020-07-28 NOTE — Progress Notes (Signed)
Family Medicine Teaching Service Daily Progress Note Intern Pager: [phone]  Patient name: Desiree [name] Medical record number: [id number] Date of birth: May 12, 1945 Age: 75 y.o. Gender: female  Primary Care Provider: Raina Mina., MD Consultants: GI  Code Status: DNR  Pt Overview and Major Events to Date:  Desiree [name] is a 75 y.o. female presenting with symptomatic anemia and concern for GI bleed . 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  Assessment and Plan:  Symptomatic Anemia  GI Bleed  Melena Hb 9.3 today. No signs of acute bleed overnight. Colonoscopy yesterday: likely malignant mass in ascending colon. Biopsy pending. GI would like Gen Surg recs for management of this mass. -GI following, appreciate recs -Continue clear liquid diet  -F/u Gen Surg recs -Hold Eliquis -Monitor CBC daily   Pulmonary mass, possible neoplasm  CT CAP: Progressive enlargement of a bilobed pulmonary nodule within the LLL demonstrating slow but progressive enlargement since remote PET CT of 09/09/2017 likely representing an indolent primary bronchogenic neoplasm. -Pulm consult    GB mass CT CAP yesterday: 12 mm enhancing nodule within the gallbladder fundus, may represent a mural mass or gallbladder polyp -F/u RUQ [place]   Alcohol use disorder  Cirrhosis  Pt with hx of alcohol excess. Last drink was 3 days prior to hospital admission. CIWAs 0 overnight -Monitor CIWAs  Atrial fibrillation  Hx of pulmonary embolism Home medications include: Eliquis 5mg  BID, carvedilol 3.125 mg twice daily -Hold Eliquis in setting of concern for GI bleed  -Continue carvedilol 3.125 mg twice daily  FEN/GI: clear liquid diet  PPx: SCDs  Dispo:Home pending clinical improvement . Barriers include on going work up.   Subjective:  No acute concerns overnight.   Objective: Temp:  [98 F (36.7 C)-98.6 F (37 C)] 98.1 F (36.7  C) (06/25 1210) Pulse Rate:  [90-98] 95 (06/25 1210) Resp:  [18-24] 19 (06/25 1210) BP: (133-161)/(54-82) 143/74 (06/25 1210) SpO2:  [96 %-100 %] 97 % (06/25 1210) FiO2 (%):  [28 %] 28 % (06/24 2100) Weight:  [53.9 kg] 53.9 kg (06/25 0500)  Physical Exam: General: Alert, no acute distress, nasal cannula, pleasant  Cardio: Normal S1 and S2, RRR Pulm: CTAB, normal work of breathing Abdomen: Bowel sounds normal. Abdomen soft and non-tender.  Extremities: No peripheral edema.  Neuro: Cranial nerves grossly intact   Laboratory: Recent Labs  Lab 07/25/20 1018 07/25/20 1823 07/26/20 0500 07/26/20 1258 07/27/20 1851 07/28/20 0148 07/28/20 0903  WBC 10.0  --  8.0  --   --   --   --   HGB 8.1*   < > 8.6*   < > 8.8* 9.0* 9.3*  HCT 25.8*   < > 27.6*   < > 28.3* 27.9* 30.7*  PLT 337  --  255  --   --   --   --    < > = values in this interval not displayed.   Recent Labs  Lab 07/25/20 1018 07/26/20 0500 07/27/20 0205  NA 132* 133* 134*  K 3.9 3.9 4.0  CL 93* 97* 100  CO2 30 29 26   BUN 7* 10 5*  CREATININE 0.75 0.80 0.67  CALCIUM 8.7* 8.5* 8.1*  PROT 5.8*  --   --   BILITOT 0.4  --   --   ALKPHOS 77  --   --   ALT 21  --   --   AST 29  --   --  GLUCOSE 90 94 92      Imaging/Diagnostic Tests: CT CHEST ABDOMEN PELVIS W CONTRAST  Result Date: 07/28/2020 CLINICAL DATA:  Colorectal carcinoma, initial staging examination EXAM: CT CHEST, ABDOMEN, AND PELVIS WITH CONTRAST TECHNIQUE: Multidetector CT imaging of the chest, abdomen and pelvis was performed following the standard protocol during bolus administration of intravenous contrast. CONTRAST:  20mL OMNIPAQUE IOHEXOL 300 MG/ML  SOLN COMPARISON:  11/22/2019 FINDINGS: CT CHEST FINDINGS Cardiovascular: No significant coronary artery calcification. Global cardiac size within normal limits. No pericardial effusion. The central pulmonary arteries are of normal caliber. Moderate atherosclerotic calcification within the thoracic  aorta. The thoracic aorta is of normal caliber. Mediastinum/Nodes: The thyroid gland is unremarkable. No pathologic thoracic adenopathy. Esophagus unremarkable. Lungs/Pleura: Mild centrilobular emphysema is again noted. There is tree-in-bud nodularity again identified within the posterior segment of the right upper lobe and peripherally within the right middle lobe with increasing collapse and consolidation of the right middle lobe inferiorly in keeping with progressive changes of chronic atypical infection, such as MAI. Interval development of a small to moderate right pleural effusion with compressive atelectasis of the dependent right lower lobe. Trace left pleural effusion. Bilobed nodule within the a left lower lobe peripherally demonstrates interval increase in size and density measuring 8 mm x 15 mm at axial image # 106/5. There is progressive diffuse bronchial wall thickening in keeping with progressive airway inflammation. No central obstructing lesion. Musculoskeletal: Posterior right glenohumeral dislocation, likely chronic in nature given the remodeling of the posterior rim of the glenoid, with advanced superimposed degenerative changes identified. Large associated right shoulder effusion noted. Multiple healed bilateral rib fractures are identified. Subacute fracture of the right second rib anteriorly noted. Advanced degenerative changes are partially visualized within the lower cervical spine and cervicothoracic junction with grade 1 anterolisthesis of C7 upon T1. No lytic or blastic bone lesions identified. CT ABDOMEN PELVIS FINDINGS Hepatobiliary: Scattered tiny hypodensities within the liver appears stable since prior examination and likely represent scattered tiny hepatic cysts. Liver is otherwise unremarkable. 12 mm nodule within the gallbladder fundus is identified possibly demonstrating rim enhancement which is indeterminate on this examination. This may represent a gallbladder polyp or mural  mass. No intra or extrahepatic biliary ductal dilation. Pancreas: Unremarkable Spleen: Unremarkable Adrenals/Urinary Tract: The adrenal glands are unremarkable. Multiple parapelvic cysts are seen within the kidneys bilaterally. The kidneys are otherwise unremarkable. The bladder is unremarkable. Stomach/Bowel: Moderate distal descending and sigmoid colonic diverticulosis. There is asymmetric circumferential thickening of the ascending colon, best seen on axial image # 90 possibly representing the patient's known primary colonic malignancy. There is no evidence of obstruction. The stomach, small bowel, and large bowel are otherwise unremarkable. Appendix normal. No free intraperitoneal gas or fluid. No pathologic mesenteric adenopathy identified. Vascular/Lymphatic: Extensive aortoiliac atherosclerotic calcification. No aortic aneurysm. No pathologic adenopathy within the abdomen and pelvis. Reproductive: Stable bilateral ovarian cysts are identified, unchanged from remote PET CT examination of 09/09/2017 where these were not hypermetabolic. Uterus is unremarkable. Other: Tiny fat containing umbilical hernia.  Rectum unremarkable. Musculoskeletal: No lytic or blastic bone lesion within the abdomen and pelvis. No acute bone abnormality. Degenerative changes are seen within the lumbar spine, asymmetrically more severe at L2-3. Posterior disc osteophyte complex at this level results in moderate central canal stenosis with flattening of the thecal sac and marked right and moderate left neuroforaminal narrowing. IMPRESSION: Circumferential focal thickening involving the ascending colon likely representing the patient's known primary malignancy. No evidence of metastatic disease within the chest,  abdomen, and pelvis. Progressive enlargement of a bilobed pulmonary nodule within the left lower lobe demonstrating slow but progressive enlargement since remote PET CT of 09/09/2017 likely representing an indolent primary  bronchogenic neoplasm. Interval development of small to moderate right pleural effusion. Associated progressive bronchial wall thickening in keeping with airway inflammation. Together, these findings may reflect changes of recent infection. No superimposed focal pulmonary infiltrate. 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 Mild centrilobular emphysema. Chronic right glenohumeral posterior dislocation with associated large right effusion. Moderate distal colonic diverticulosis. No superimposed focal inflammatory change. 12 mm enhancing nodule within the gallbladder fundus. This may represent a mural mass or gallbladder polyp. Dedicated sonography is recommended for further evaluation. Stable bilateral ovarian cysts, metabolically negative on prior PET CT examination of 09/09/2017, safely considered benign given their stability over time. No follow-up imaging recommended. Note: This recommendation does not apply to premenarchal patients and to those with increased risk (genetic, family history, elevated tumor markers or other high-risk factors) of ovarian cancer. Reference: JACR 2020 Feb; 17(2):248-254 Advanced degenerative change within the lumbar spine, asymmetrically more severe at L2-3 with resultant moderate central canal stenosis and bilateral neuroforaminal narrowing, not optimally profiled on this examination. This could be better assessed with MRI examination, if indicated. Aortic Atherosclerosis (ICD10-I70.0) and Emphysema (ICD10-J43.9). Electronically Signed   By: Fidela Salisbury MD   On: 07/28/2020 01:31     Lattie Haw, MD 07/28/2020, 12:44 PM PGY-2, [facility] pager: [phone], text pages welcome

## 2020-07-29 LAB — HEMOGLOBIN AND HEMATOCRIT, BLOOD
HCT: 27.1 % — ABNORMAL LOW (ref 36.0–46.0)
HCT: 29.7 % — ABNORMAL LOW (ref 36.0–46.0)
HCT: 27.9 % — ABNORMAL LOW (ref 36.0–46.0)
Hemoglobin: 8.5 g/dL — ABNORMAL LOW (ref 12.0–15.0)
Hemoglobin: 9.1 g/dL — ABNORMAL LOW (ref 12.0–15.0)
Hemoglobin: 8.5 g/dL — ABNORMAL LOW (ref 12.0–15.0)

## 2020-07-29 MED ORDER — SODIUM CHLORIDE 0.9 % IV SOLN
125.0000 mg | Freq: Once | INTRAVENOUS | Status: AC
  Administered 2020-07-29: 125 mg via INTRAVENOUS
  Filled 2020-07-29 (×2): qty 10

## 2020-07-29 MED ORDER — PHENOL 1.4 % MT LIQD: 1.0000 | OROMUCOSAL | Status: DC | PRN

## 2020-07-29 NOTE — Progress Notes (Signed)
2 Days Post-Op   Subjective/Chief Complaint: No complaints   Objective: Vital signs in last 24 hours: Temp:  [97.6 F (36.4 C)-98.8 F (37.1 C)] 97.8 F (36.6 C) (06/26 0758) Pulse Rate:  [85-95] 89 (06/26 0758) Resp:  [17-20] 17 (06/26 0758) BP: (131-153)/(63-74) 149/65 (06/26 0758) SpO2:  [96 %-99 %] 99 % (06/26 0903) Weight:  [54.2 kg] 54.2 kg (06/26 0400) Last BM Date: 07/28/20  Intake/Output from previous day: 06/25 0701 - 06/26 0700 In: 2505 [P.O.:640; I.V.:1765; IV Piggyback:100] Out: 800 [Urine:800] Intake/Output this shift: No intake/output data recorded.  General appearance: alert and cooperative Resp: clear to auscultation bilaterally Cardio: regular rate and rhythm GI: soft, nontender  Lab Results:  Recent Labs    07/28/20 1712 07/29/20 0059  HGB 8.6* 8.5*  HCT 27.1* 27.1*   BMET Recent Labs    07/27/20 0205  NA 134*  K 4.0  CL 100  CO2 26  GLUCOSE 92  BUN 5*  CREATININE 0.67  CALCIUM 8.1*   PT/INR No results for input(s): LABPROT, INR in the last 72 hours. ABG No results for input(s): PHART, HCO3 in the last 72 hours.  Invalid input(s): PCO2, PO2  Studies/Results: DG Chest 1 View  Result Date: 07/28/2020 CLINICAL DATA:  Status post thoracentesis. EXAM: CHEST  1 VIEW COMPARISON:  Chest radiograph 07/25/2020 and CT 07/28/2020 FINDINGS: The cardiomediastinal silhouette is within normal limits for portable AP technique. Lung volumes are low with similar appearance of hazy right basilar opacity compatible with atelectasis. There are likely persistent small right and trace left pleural effusions. No pneumothorax is identified. IMPRESSION: Low lung volumes with unchanged right basilar atelectasis and persistent small pleural effusions. No pneumothorax. Electronically Signed   By: Logan Bores M.D.   On: 07/28/2020 14:55   DG Cervical Spine With Flex & Extend  Result Date: 07/28/2020 CLINICAL DATA:  History of rheumatoid arthritis. Possible preop  evaluation. EXAM: CERVICAL SPINE COMPLETE WITH FLEXION AND EXTENSION VIEWS COMPARISON:  03/21/2009. FINDINGS: No fracture. There are grade 1 anterolisthesis of C3 and C4 and C4 on C5, both measuring 3 mm on the neutral lateral view, increasing to 4 mm with flexion and decreasing to 2 mm at C3-C4 and 3 mm at C4-C5 with extension. No other subluxation. The atlantoaxial alignment is stable. No change in the space between the anterior arch of C1 and the anterior margin of the odontoid process with flexion or extension. Mild loss of disc height at C4-C5. Moderate to marked loss of disc height at C5-C6 and C6-C7. There are bilateral facet degenerative changes and the skeletal structures are diffusely demineralized. Soft tissues show bilateral carotid artery calcifications but are otherwise unremarkable. IMPRESSION: 1. No fracture or acute finding. 2. Degenerative changes as detailed. 3. Grade 1 anterolisthesis of C3 and C4 and C4 on C5 with mild subluxation at both levels with flexion and extension as detailed above. No evidence of atlantoaxial instability. Electronically Signed   By: Lajean Manes M.D.   On: 07/28/2020 16:09   CT CHEST ABDOMEN PELVIS W CONTRAST  Result Date: 07/28/2020 CLINICAL DATA:  Colorectal carcinoma, initial staging examination EXAM: CT CHEST, ABDOMEN, AND PELVIS WITH CONTRAST TECHNIQUE: Multidetector CT imaging of the chest, abdomen and pelvis was performed following the standard protocol during bolus administration of intravenous contrast. CONTRAST:  28mL OMNIPAQUE IOHEXOL 300 MG/ML  SOLN COMPARISON:  11/22/2019 FINDINGS: CT CHEST FINDINGS Cardiovascular: No significant coronary artery calcification. Global cardiac size within normal limits. No pericardial effusion. The central pulmonary arteries are  of normal caliber. Moderate atherosclerotic calcification within the thoracic aorta. The thoracic aorta is of normal caliber. Mediastinum/Nodes: The thyroid gland is unremarkable. No pathologic  thoracic adenopathy. Esophagus unremarkable. Lungs/Pleura: Mild centrilobular emphysema is again noted. There is tree-in-bud nodularity again identified within the posterior segment of the right upper lobe and peripherally within the right middle lobe with increasing collapse and consolidation of the right middle lobe inferiorly in keeping with progressive changes of chronic atypical infection, such as MAI. Interval development of a small to moderate right pleural effusion with compressive atelectasis of the dependent right lower lobe. Trace left pleural effusion. Bilobed nodule within the a left lower lobe peripherally demonstrates interval increase in size and density measuring 8 mm x 15 mm at axial image # 106/5. There is progressive diffuse bronchial wall thickening in keeping with progressive airway inflammation. No central obstructing lesion. Musculoskeletal: Posterior right glenohumeral dislocation, likely chronic in nature given the remodeling of the posterior rim of the glenoid, with advanced superimposed degenerative changes identified. Large associated right shoulder effusion noted. Multiple healed bilateral rib fractures are identified. Subacute fracture of the right second rib anteriorly noted. Advanced degenerative changes are partially visualized within the lower cervical spine and cervicothoracic junction with grade 1 anterolisthesis of C7 upon T1. No lytic or blastic bone lesions identified. CT ABDOMEN PELVIS FINDINGS Hepatobiliary: Scattered tiny hypodensities within the liver appears stable since prior examination and likely represent scattered tiny hepatic cysts. Liver is otherwise unremarkable. 12 mm nodule within the gallbladder fundus is identified possibly demonstrating rim enhancement which is indeterminate on this examination. This may represent a gallbladder polyp or mural mass. No intra or extrahepatic biliary ductal dilation. Pancreas: Unremarkable Spleen: Unremarkable Adrenals/Urinary  Tract: The adrenal glands are unremarkable. Multiple parapelvic cysts are seen within the kidneys bilaterally. The kidneys are otherwise unremarkable. The bladder is unremarkable. Stomach/Bowel: Moderate distal descending and sigmoid colonic diverticulosis. There is asymmetric circumferential thickening of the ascending colon, best seen on axial image # 90 possibly representing the patient's known primary colonic malignancy. There is no evidence of obstruction. The stomach, small bowel, and large bowel are otherwise unremarkable. Appendix normal. No free intraperitoneal gas or fluid. No pathologic mesenteric adenopathy identified. Vascular/Lymphatic: Extensive aortoiliac atherosclerotic calcification. No aortic aneurysm. No pathologic adenopathy within the abdomen and pelvis. Reproductive: Stable bilateral ovarian cysts are identified, unchanged from remote PET CT examination of 09/09/2017 where these were not hypermetabolic. Uterus is unremarkable. Other: Tiny fat containing umbilical hernia.  Rectum unremarkable. Musculoskeletal: No lytic or blastic bone lesion within the abdomen and pelvis. No acute bone abnormality. Degenerative changes are seen within the lumbar spine, asymmetrically more severe at L2-3. Posterior disc osteophyte complex at this level results in moderate central canal stenosis with flattening of the thecal sac and marked right and moderate left neuroforaminal narrowing. IMPRESSION: Circumferential focal thickening involving the ascending colon likely representing the patient's known primary malignancy. No evidence of metastatic disease within the chest, abdomen, and pelvis. Progressive enlargement of a bilobed pulmonary nodule within the left lower lobe demonstrating slow but progressive enlargement since remote PET CT of 09/09/2017 likely representing an indolent primary bronchogenic neoplasm. Interval development of small to moderate right pleural effusion. Associated progressive bronchial  wall thickening in keeping with airway inflammation. Together, these findings may reflect changes of recent infection. No superimposed focal pulmonary infiltrate. Persistent tree-in-bud nodularity within the right lung and progressive collapse and consolidation, particularly evident within the right middle lobe in keeping with changes of chronic atypical infection such  as MAI. Mild centrilobular emphysema. Chronic right glenohumeral posterior dislocation with associated large right effusion. Moderate distal colonic diverticulosis. No superimposed focal inflammatory change. 12 mm enhancing nodule within the gallbladder fundus. This may represent a mural mass or gallbladder polyp. Dedicated sonography is recommended for further evaluation. Stable bilateral ovarian cysts, metabolically negative on prior PET CT examination of 09/09/2017, safely considered benign given their stability over time. No follow-up imaging recommended. Note: This recommendation does not apply to premenarchal patients and to those with increased risk (genetic, family history, elevated tumor markers or other high-risk factors) of ovarian cancer. Reference: JACR 2020 Feb; 17(2):248-254 Advanced degenerative change within the lumbar spine, asymmetrically more severe at L2-3 with resultant moderate central canal stenosis and bilateral neuroforaminal narrowing, not optimally profiled on this examination. This could be better assessed with MRI examination, if indicated. Aortic Atherosclerosis (ICD10-I70.0) and Emphysema (ICD10-J43.9). Electronically Signed   By: Fidela Salisbury MD   On: 07/28/2020 01:31   US Abdomen Limited RUQ (LIVER/GB)  Result Date: 07/29/2020 CLINICAL DATA:  Follow-up abnormality in the gallbladder seen on a current CT scan. EXAM: ULTRASOUND ABDOMEN LIMITED RIGHT UPPER QUADRANT COMPARISON:  CT, 07/28/2020. FINDINGS: Gallbladder: There is hypoechoic material in the gallbladder fundus measuring 1.4 x 0.7 x 1.2 cm. There is no  definite color Doppler blood flow within this. Remainder of the gallbladder is unremarkable. No shadowing stones. No wall thickening. Common bile duct: Diameter: 4-5 mm. Liver: Normal overall size. Small cysts in the left lobe. Relative hyperechoic area along the left lobe, of unclear etiology, with no correlate on the CT scan. This area measures approximately 4.6 x 4.6 x 0.3 cm. No other liver abnormality. Portal vein is patent on color Doppler imaging with normal direction of blood flow towards the liver. Other: Right pleural effusion. IMPRESSION: 1. Small hypoechoic area in the gallbladder fundus, 1.4 cm in greatest dimension. Malignancy is not excluded although this could reflect an area of tumefactive sludge. Recommend short-term follow-up with repeat ultrasound in 4-6 weeks. Alternatively, this could be further assessed and characterized with liver MRI without and with contrast. 2. Geographic area of relative hyperechogenicity along the left liver lobe of unclear etiology. There is no correlate on the current CT scan, making a mass un likely. This may reflect an area of focal fatty infiltration. This could also be further assessed with liver MRI. 3. Small liver cysts. 4. No acute findings. Electronically Signed   By: Lajean Manes M.D.   On: 07/29/2020 08:30    Anti-infectives: Anti-infectives (From admission, onward)    Start     Dose/Rate Route Frequency Ordered Stop   07/26/20 1000  hydroxychloroquine (PLAQUENIL) tablet 200 mg        200 mg Oral 2 times daily 07/25/20 2036         Assessment/Plan: s/p Procedure(s): ESOPHAGOGASTRODUODENOSCOPY (EGD) WITH PROPOFOL (N/A) COLONOSCOPY (N/A) BIOPSY SUBMUCOSAL TATTOO INJECTION Continue clears. Npo after midnight for possible surgery tomorrow R colon mass. Worrisome for a malignancy. Path pending. Will likely need surgery this week for resection Hold anticoagulation for now Anemia stable. Hg 8.6. will type and cross  LOS: 3 days    Autumn Messing  III 07/29/2020

## 2020-07-29 NOTE — Progress Notes (Addendum)
Family Medicine Teaching Service Daily Progress Note Intern Pager: [phone]  Patient name: Desiree [name] Medical record number: [id number] Date of birth: September 13, 1945 Age: 75 y.o. Gender: female  Primary Care Provider: Raina Mina., MD Consultants: Carola Frost, Gen sx Code Status: DNR  Pt Overview and Major Events to Date:  6/22 Admitted, GI consulted 6/23 EGD, colonoscopy 6/25 Pulm consulted, thoracentesis, Gen sx consulted  Assessment and Plan: Desiree [name] is a 75 y.o. female presenting with symptomatic anemia and concern for GI bleed . 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  Symptomatic Anemia  GI Bleed  Melena  Gallbladder mass VSS. Hgb 8.5 today. No signs of acute bleed overnight. Colonoscopy: likely malignant mass in ascending colon. CT w/ 12 mm gallbladder mass v. Polyp. Biopsy pending. RUQ [place] w/ small hypoechoic area in gallbladder fundus, 1.4 cm in greatest dimension. Malignancy is not excluded. Hyperechogenicity along the left liver lobe of unclear etiology. Small liver cysts. Consider follow up with liver MRI. Gen sx recommends: surgery pending biopsy results, liquid diet, hold eliquis. -GI/Gen sx following, appreciate recs -Continue clear liquid diet -Hold Eliquis -H/H q8h   Pulmonary mass, possible neoplasm  s/p thoracentesis 6/25 CT CAP: Progressive enlargement of a bilobed pulmonary nodule within the LLL demonstrating slow but progressive enlargement since remote PET CT of 09/09/2017 likely representing an indolent primary bronchogenic neoplasm. -Pulm recs: f/u pleural fluid labs, will eventually need biopsy    Alcohol use disorder  Cirrhosis  CIWA most recently 0 -Discontinue CIWAs   Atrial fibrillation  Hx of pulmonary embolism HR 88. Tele w/ NSR. Home medications include: Eliquis 5mg  BID, carvedilol 3.125 mg twice daily -Hold Eliquis in setting of acute bleed  -Continue  carvedilol at home dose  FEN/GI: Liquid diet, NS 158mL/hr, protonix PPx: SCDs Dispo:Home  pending further work up . Barriers include n/a.   Subjective:  Endorsing fatigue otherwise states she feels ok.   Objective: Temp:  [97.6 F (36.4 C)-98.8 F (37.1 C)] 97.8 F (36.6 C) (06/26 0758) Pulse Rate:  [85-95] 89 (06/26 0758) Resp:  [17-20] 17 (06/26 0758) BP: (131-153)/(63-74) 149/65 (06/26 0758) SpO2:  [96 %-98 %] 96 % (06/26 0758) Weight:  [54.2 kg] 54.2 kg (06/26 0400)  Physical Exam: General: Lying in bed receiving breathing treatment. No acute distress.  Cardiovascular: RRR. No murmurs.  Respiratory: CTAB. Normal effort.  Abdomen: NABS. Nontender. No palpable masses. No guarding.  Extremities: No LE edema   Laboratory: Recent Labs  Lab 07/25/20 1018 07/25/20 1823 07/26/20 0500 07/26/20 1258 07/28/20 0903 07/28/20 1712 07/29/20 0059  WBC 10.0  --  8.0  --   --   --   --   HGB 8.1*   < > 8.6*   < > 9.3* 8.6* 8.5*  HCT 25.8*   < > 27.6*   < > 30.7* 27.1* 27.1*  PLT 337  --  255  --   --   --   --    < > = values in this interval not displayed.   Recent Labs  Lab 07/25/20 1018 07/26/20 0500 07/27/20 0205  NA 132* 133* 134*  K 3.9 3.9 4.0  CL 93* 97* 100  CO2 30 29 26   BUN 7* 10 5*  CREATININE 0.75 0.80 0.67  CALCIUM 8.7* 8.5* 8.1*  PROT 5.8*  --   --   BILITOT 0.4  --   --   ALKPHOS 77  --   --  ALT 21  --   --   AST 29  --   --   GLUCOSE 90 94 92   Imaging/Diagnostic Tests:  ULTRASOUND ABDOMEN LIMITED RIGHT UPPER QUADRANT COMPARISON:  CT, 07/28/2020. IMPRESSION: 1. Small hypoechoic area in the gallbladder fundus, 1.4 cm in greatest dimension. Malignancy is not excluded although this could reflect an area of tumefactive sludge. Recommend short-term follow-up with repeat ultrasound in 4-6 weeks. Alternatively, this could be further assessed and characterized with liver MRI without and with contrast. 2. Geographic area of relative  hyperechogenicity along the left liver lobe of unclear etiology. There is no correlate on the current CT scan, making a mass un likely. This may reflect an area of focal fatty infiltration. This could also be further assessed with liver MRI. 3. Small liver cysts. 4. No acute findings.  Gerlene Fee, DO 07/29/2020, 7:59 AM PGY-2, [place] Intern pager: [phone], text pages welcome

## 2020-07-29 NOTE — Progress Notes (Signed)
FPTS Interim Progress Note  Patient sleeping and resting comfortably.  Rounded with primary RN.  No concerns voiced.  No orders required.  Appreciated nightly round.  Today's Vitals   07/28/20 2008 07/28/20 2014 07/28/20 2153 07/28/20 2354  BP:  131/63  (!) 153/71  Pulse:  85  87  Resp:  20  19  Temp:  98.6 F (37 C)  98.8 F (37.1 C)  TempSrc:  Oral  Oral  SpO2: 98% 97%  98%  Weight:      Height:      PainSc:   0-No pain     Carollee Leitz, MD 07/29/2020, 2:07 AM PGY-2, [place] Medicine Service pager [phone]

## 2020-07-29 NOTE — Progress Notes (Signed)
Occupational Therapy Evaluation Patient Details Name: Desiree [name] MRN: [id number] DOB: 26-May-1945 Today's Date: 07/29/2020    History of Present Illness Pt is a 75 y/o female admitted 6/22 secondary to symptomatic anemia. PMH includes cirrhosis, a fib, COPD on 2L and CKD.   Clinical Impression   PTA pt lives in ALF in [place] and is modified independent with ADL and self care. Recent falls. Pt currently requries min A for mobility and ADL tasks with exception of total A for toileting as pt incontinent of BM. Pt agreeable to rehab at SNF. Will follow acutely.     Follow Up Recommendations  SNF    Equipment Recommendations  None recommended by OT    Recommendations for Other Services       Precautions / Restrictions Precautions Precautions: Fall      Mobility Bed Mobility               General bed mobility comments: up with nsg on entry    Transfers Overall transfer level: Needs assistance Equipment used: 1 person hand held assist Transfers: Sit to/from Stand Sit to Stand: Min assist         General transfer comment: Min A for steadying to stand from higher stretcher.    Balance Overall balance assessment: Needs assistance Sitting-balance support: No upper extremity supported Sitting balance-Leahy Scale: Fair     Standing balance support: Bilateral upper extremity supported Standing balance-Leahy Scale: Poor Standing balance comment: Reliant on BUE support                           ADL either performed or assessed with clinical judgement   ADL Overall ADL's : Needs assistance/impaired Eating/Feeding: Modified independent   Grooming: Set up;Sitting   Upper Body Bathing: Set up;Sitting   Lower Body Bathing: Minimal assistance;Sit to/from stand   Upper Body Dressing : Set up;Sitting   Lower Body Dressing: Minimal assistance;Sit to/from stand   Toilet Transfer: Minimal assistance;Ambulation;RW   Toileting- Clothing Manipulation  and Hygiene: Total assistance Toileting - Clothing Manipulation Details (indicate cue type and reason): incontinent of BM     Functional mobility during ADLs: Minimal assistance;Rolling walker;Cueing for safety;Cueing for sequencing       Vision         Perception     Praxis      Pertinent Vitals/Pain Pain Assessment: No/denies pain     Hand Dominance     Extremity/Trunk Assessment Upper Extremity Assessment Upper Extremity Assessment: Generalized weakness   Lower Extremity Assessment Lower Extremity Assessment: Defer to PT evaluation   Cervical / Trunk Assessment Cervical / Trunk Assessment: Kyphotic   Communication Communication Communication: No difficulties   Cognition Arousal/Alertness: Awake/alert Behavior During Therapy: WFL for tasks assessed/performed Overall Cognitive Status: No family/caregiver present to determine baseline cognitive functioning                                 General Comments: slow processing; poor safety awareness and decreased awareness of the impact of deficits of function   General Comments  2/4 DOE with  minimal activity    Exercises     Shoulder Instructions      Home Living Family/patient expects to be discharged to:: Private residence Living Arrangements: Alone Available Help at Discharge: Friend(s);Available PRN/intermittently Type of Home: Independent living facility [facility] top) Home Access: Level entry     Home Layout: One  level     Bathroom Shower/Tub: Occupational psychologist: Standard Bathroom Accessibility: Yes   Home Equipment: Toilet riser;Walker - 4 wheels;Shower seat   Additional Comments: On 2L of oxygen at home      Prior Functioning/Environment Level of Independence: Independent with assistive device(s)        Comments: Uses rollator for ambulation        OT Problem List: Decreased strength;Decreased activity tolerance;Impaired balance (sitting and/or  standing);Decreased safety awareness;Decreased cognition;Cardiopulmonary status limiting activity      OT Treatment/Interventions: Self-care/ADL training;Therapeutic exercise;Neuromuscular education;Energy conservation;DME and/or AE instruction;Therapeutic activities;Cognitive remediation/compensation;Patient/family education;Balance training    OT Goals(Current goals can be found in the care plan section) Acute Rehab OT Goals Patient Stated Goal: to go to SNF to get stronger and then hopefully ALF OT Goal Formulation: With patient Time For Goal Achievement: 08/12/20 Potential to Achieve Goals: Good  OT Frequency: Min 2X/week   Barriers to D/C:            Co-evaluation              AM-PAC OT "6 Clicks" Daily Activity     Outcome Measure Help from another person eating meals?: None Help from another person taking care of personal grooming?: A Little Help from another person toileting, which includes using toliet, bedpan, or urinal?: Total Help from another person bathing (including washing, rinsing, drying)?: A Little Help from another person to put on and taking off regular upper body clothing?: A Little Help from another person to put on and taking off regular lower body clothing?: A Little 6 Click Score: 17   End of Session Equipment Utilized During Treatment: Gait belt;Rolling walker;Oxygen (2L) Nurse Communication: Mobility status  Activity Tolerance: Patient tolerated treatment well Patient left: in chair;with call bell/phone within reach;with chair alarm set  OT Visit Diagnosis: Unsteadiness on feet (R26.81);Muscle weakness (generalized) (M62.81);Other symptoms and signs involving cognitive function                Time: 1045-1104 OT Time Calculation (min): 19 min Charges:  OT General Charges $OT Visit: 1 Visit OT Evaluation $OT Eval Moderate Complexity: [place], OT/L   Acute OT Clinical Specialist Acute Rehabilitation Services Pager  [phone] Office [phone]   [facility] 07/29/2020, 12:28 PM

## 2020-07-30 ENCOUNTER — Telehealth: Payer: Medicare PPO | Admitting: Cardiology

## 2020-07-30 ENCOUNTER — Encounter (HOSPITAL_COMMUNITY): Payer: Self-pay | Admitting: Internal Medicine

## 2020-07-30 DIAGNOSIS — Z9889 Other specified postprocedural states: Secondary | ICD-10-CM

## 2020-07-30 LAB — HEMOGLOBIN AND HEMATOCRIT, BLOOD
HCT: 27.9 % — ABNORMAL LOW (ref 36.0–46.0)
HCT: 29.3 % — ABNORMAL LOW (ref 36.0–46.0)
Hemoglobin: 8.6 g/dL — ABNORMAL LOW (ref 12.0–15.0)
Hemoglobin: 9 g/dL — ABNORMAL LOW (ref 12.0–15.0)

## 2020-07-30 LAB — CYTOLOGY - NON PAP

## 2020-07-30 LAB — SURGICAL PATHOLOGY

## 2020-07-30 MED ORDER — SODIUM CHLORIDE 0.9 % IV SOLN
2.0000 g | INTRAVENOUS | Status: DC
  Administered 2020-07-31 – 2020-08-01 (×2): 2 g via INTRAVENOUS
  Filled 2020-07-30 (×2): qty 20

## 2020-07-30 NOTE — Progress Notes (Addendum)
Progress Note  3 Days Post-Op  Subjective: CC: just finished working with respiratory therapy and is feeling a little short of breath but overall breathing well. She does not have any pain and is having frequent soft Bms without pain or difficulty. Has worked with OT and been OOB - she denies dizziness with this. Currently NPO but had been tolerating clears well and denies nausea, emesis, abdominal pain.  Daughter is bedside  Objective: Vital signs in last 24 hours: Temp:  [97.8 F (36.6 C)-98.6 F (37 C)] 98.2 F (36.8 C) (06/27 0809) Pulse Rate:  [87-95] 95 (06/27 0810) Resp:  [18-23] 23 (06/27 0810) BP: (137-166)/(58-82) 166/82 (06/27 0810) SpO2:  [93 %-99 %] 93 % (06/27 0810) FiO2 (%):  [28 %] 28 % (06/27 0407) Last BM Date: 07/29/20  Intake/Output from previous day: 06/26 0701 - 06/27 0700 In: 1850.4 [P.O.:840; I.V.:910.9; IV Piggyback:99.6] Out: 1000 [Urine:1000] Intake/Output this shift: No intake/output data recorded.  PE: General: pleasant, WD, female who is laying in bed in NAD HEENT: head is normocephalic, atraumatic.  Mouth is pink and moist Heart: regular, rate, and rhythm.  Normal s1,s2. No obvious murmurs, gallops, or rubs noted.  Palpable radial and pedal pulses bilaterally Lungs: CTAB, no rhonchi, or rales noted. Minimal expiratory wheeze bilaterally. Respiratory effort nonlabored on 1 LPM O2 via [place] Abd: soft, NT, ND, +BS, no masses, hernias, or organomegaly MS: all 4 extremities are symmetrical with no cyanosis, clubbing, or edema. No calf TTP bilaterally Skin: warm and dry with no masses, lesions, or rashes Psych: A&Ox3 with an appropriate affect.    Lab Results:  Recent Labs    07/30/20 0128 07/30/20 0904  HGB 8.6* 9.0*  HCT 27.9* 29.3*   BMET No results for input(s): NA, K, CL, CO2, GLUCOSE, BUN, CREATININE, CALCIUM in the last 72 hours. PT/INR No results for input(s): LABPROT, INR in the last 72 hours. CMP     Component Value Date/Time    NA 134 (L) 07/27/2020 0205   NA 139 12/24/2018 1214   K 4.0 07/27/2020 0205   CL 100 07/27/2020 0205   CO2 26 07/27/2020 0205   GLUCOSE 92 07/27/2020 0205   BUN 5 (L) 07/27/2020 0205   BUN 18 12/24/2018 1214   CREATININE 0.67 07/27/2020 0205   CALCIUM 8.1 (L) 07/27/2020 0205   PROT 5.8 (L) 07/25/2020 1018   ALBUMIN 2.9 (L) 07/25/2020 1018   AST 29 07/25/2020 1018   ALT 21 07/25/2020 1018   ALKPHOS 77 07/25/2020 1018   BILITOT 0.4 07/25/2020 1018   GFRNONAA >60 07/27/2020 0205   GFRAA 75 12/24/2018 1214   Lipase     Component Value Date/Time   LIPASE 85 (H) 07/25/2020 1823       Studies/Results: DG Chest 1 View  Result Date: 07/28/2020 CLINICAL DATA:  Status post thoracentesis. EXAM: CHEST  1 VIEW COMPARISON:  Chest radiograph 07/25/2020 and CT 07/28/2020 FINDINGS: The cardiomediastinal silhouette is within normal limits for portable AP technique. Lung volumes are low with similar appearance of hazy right basilar opacity compatible with atelectasis. There are likely persistent small right and trace left pleural effusions. No pneumothorax is identified. IMPRESSION: Low lung volumes with unchanged right basilar atelectasis and persistent small pleural effusions. No pneumothorax. Electronically Signed   By: Logan Bores M.D.   On: 07/28/2020 14:55   DG Cervical Spine With Flex & Extend  Result Date: 07/28/2020 CLINICAL DATA:  History of rheumatoid arthritis. Possible preop evaluation. EXAM: CERVICAL SPINE COMPLETE WITH  FLEXION AND EXTENSION VIEWS COMPARISON:  03/21/2009. FINDINGS: No fracture. There are grade 1 anterolisthesis of C3 and C4 and C4 on C5, both measuring 3 mm on the neutral lateral view, increasing to 4 mm with flexion and decreasing to 2 mm at C3-C4 and 3 mm at C4-C5 with extension. No other subluxation. The atlantoaxial alignment is stable. No change in the space between the anterior arch of C1 and the anterior margin of the odontoid process with flexion or extension.  Mild loss of disc height at C4-C5. Moderate to marked loss of disc height at C5-C6 and C6-C7. There are bilateral facet degenerative changes and the skeletal structures are diffusely demineralized. Soft tissues show bilateral carotid artery calcifications but are otherwise unremarkable. IMPRESSION: 1. No fracture or acute finding. 2. Degenerative changes as detailed. 3. Grade 1 anterolisthesis of C3 and C4 and C4 on C5 with mild subluxation at both levels with flexion and extension as detailed above. No evidence of atlantoaxial instability. Electronically Signed   By: Lajean Manes M.D.   On: 07/28/2020 16:09   US Abdomen Limited RUQ (LIVER/GB)  Result Date: 07/29/2020 CLINICAL DATA:  Follow-up abnormality in the gallbladder seen on a current CT scan. EXAM: ULTRASOUND ABDOMEN LIMITED RIGHT UPPER QUADRANT COMPARISON:  CT, 07/28/2020. FINDINGS: Gallbladder: There is hypoechoic material in the gallbladder fundus measuring 1.4 x 0.7 x 1.2 cm. There is no definite color Doppler blood flow within this. Remainder of the gallbladder is unremarkable. No shadowing stones. No wall thickening. Common bile duct: Diameter: 4-5 mm. Liver: Normal overall size. Small cysts in the left lobe. Relative hyperechoic area along the left lobe, of unclear etiology, with no correlate on the CT scan. This area measures approximately 4.6 x 4.6 x 0.3 cm. No other liver abnormality. Portal vein is patent on color Doppler imaging with normal direction of blood flow towards the liver. Other: Right pleural effusion. IMPRESSION: 1. Small hypoechoic area in the gallbladder fundus, 1.4 cm in greatest dimension. Malignancy is not excluded although this could reflect an area of tumefactive sludge. Recommend short-term follow-up with repeat ultrasound in 4-6 weeks. Alternatively, this could be further assessed and characterized with liver MRI without and with contrast. 2. Geographic area of relative hyperechogenicity along the left liver lobe of  unclear etiology. There is no correlate on the current CT scan, making a mass un likely. This may reflect an area of focal fatty infiltration. This could also be further assessed with liver MRI. 3. Small liver cysts. 4. No acute findings. Electronically Signed   By: Lajean Manes M.D.   On: 07/29/2020 08:30    Anti-infectives: Anti-infectives (From admission, onward)    Start     Dose/Rate Route Frequency Ordered Stop   07/26/20 1000  hydroxychloroquine (PLAQUENIL) tablet 200 mg        200 mg Oral 2 times daily 07/25/20 2036          Assessment/Plan Ascending colon mass - Worrisome for a malignancy. - Path pending. Will likely need surgery this week for resection - CEA pending -afebrile, VSS. On 2 lpm O2 via [place] - Continue to hold anticoagulation for now Anemia stable. Hg 9.0 (8.6). Typed and crossed. S/p 1 unit RBCs 6/22 - looks stable for surgery - will discuss with MD for possible surgical intervention today pending OR availability - remain NPO. Will give CLD if no OR today  Abnormal gallbladder - CT with enhancing nodule. [place] with hypoechogenicity (sludge vs mass) - repeat US in 4-6 weeks?  FEN: NPO, IVF per primary ID: none VTE: SCDs  Per primary: Atrial fibrillation with history fo PE - on eliquis at baseline Iron deficiency anemia, symptomatic - ferrlecit Alcohol abuse Pulmonary mass Tobacco use - 40 pack year history   LOS: 4 days    Winferd Humphrey, [facility] Surgery 07/30/2020, 10:11 AM Please see Amion for pager number during day hours 7:00am-4:30pm

## 2020-07-30 NOTE — Progress Notes (Signed)
Physical Therapy Treatment Patient Details Name: Desiree [name] MRN: [id number] DOB: [birth year] Today's Date: 07/30/2020    History of Present Illness Pt is a 75 y/o female admitted 6/22 secondary to symptomatic anemia. Pt found to have colon mass that is worrisom for malignancy - scheduled for R hemicolectomy 07/31/20. PMH includes cirrhosis, a fib, COPD on 2L and CKD.    PT Comments    Pt making gradual progress. She does require min A for transfers and to steady with ambulation.  She fatigues easily requiring seated rest breaks.  Continue to recommend SNF as pt not ambulating household distances, requires assist, and is fall risk - suspect will be even more deconditioned post surgery that is scheduled tomorrow.     Follow Up Recommendations  SNF     Equipment Recommendations  None recommended by PT    Recommendations for Other Services       Precautions / Restrictions Precautions Precautions: Fall Precaution Comments: watch O2    Mobility  Bed Mobility               General bed mobility comments: in chair at arrival    Transfers Overall transfer level: Needs assistance Equipment used: 4-wheeled walker Transfers: Sit to/from Stand Sit to Stand: Min assist         General transfer comment: Min A to steady.  Stood from chair and rollator seat.  CUes and assist for rollator brakes  Ambulation/Gait Ambulation/Gait assistance: Min assist Gait Distance (Feet): 40 Feet (40' then 60') Assistive device: 4-wheeled walker Gait Pattern/deviations: Step-to pattern;Decreased stride length;Trunk flexed     General Gait Details: Min A to steady and cues for rollator.  Pt requiring 3-4 min seated rest break due to shortness of breath and fatigue.   Stairs             Wheelchair Mobility    Modified Rankin (Stroke Patients Only)       Balance Overall balance assessment: Needs assistance Sitting-balance support: No upper extremity supported Sitting  balance-Leahy Scale: Fair     Standing balance support: Bilateral upper extremity supported Standing balance-Leahy Scale: Poor Standing balance comment: Reliant on BUE support                            Cognition Arousal/Alertness: Awake/alert Behavior During Therapy: WFL for tasks assessed/performed Overall Cognitive Status: Within Functional Limits for tasks assessed                                 General Comments: Very pleasant, daughter present.  Did need some cues for safety      Exercises      General Comments General comments (skin integrity, edema, etc.): Pt on 1 L O2 with sats 92% or greater.  HR up to 110 bpm with walking.  Educated on importance of mobility, encouraged ankle pumps and LE movement.  Discussed would see after surgery as able.      Pertinent Vitals/Pain Pain Assessment: No/denies pain    Home Living                      Prior Function            PT Goals (current goals can now be found in the care plan section) Acute Rehab PT Goals Patient Stated Goal: to go to SNF to get stronger and then hopefully  ALF PT Goal Formulation: With patient/family Time For Goal Achievement: 08/09/20 Potential to Achieve Goals: Good Progress towards PT goals: Progressing toward goals    Frequency    Min 2X/week      PT Plan Current plan remains appropriate    Co-evaluation              AM-PAC PT "6 Clicks" Mobility   Outcome Measure  Help needed turning from your back to your side while in a flat bed without using bedrails?: A Little Help needed moving from lying on your back to sitting on the side of a flat bed without using bedrails?: A Little Help needed moving to and from a bed to a chair (including a wheelchair)?: A Little Help needed standing up from a chair using your arms (e.g., wheelchair or bedside chair)?: A Little Help needed to walk in hospital room?: A Little Help needed climbing 3-5 steps with a  railing? : A Lot 6 Click Score: 17    End of Session Equipment Utilized During Treatment: Gait belt Activity Tolerance: Patient tolerated treatment well Patient left: in chair;with call bell/phone within reach;with family/visitor present Nurse Communication: Mobility status PT Visit Diagnosis: Unsteadiness on feet (R26.81);Muscle weakness (generalized) (M62.81)     Time: 4327-6147 PT Time Calculation (min) (ACUTE ONLY): 26 min  Charges:  $Gait Training: 8-22 mins $Therapeutic Activity: 8-22 mins                     Abran Richard, PT Acute Rehab Services Pager [phone] [facility] 07/30/2020, 3:01 PM

## 2020-07-30 NOTE — Progress Notes (Signed)
Family Medicine Teaching Service Daily Progress Note Intern Pager: [phone]  Patient name: Desiree [name] Medical record number: [id number] Date of birth: 1945/04/14 Age: 75 y.o. Gender: female  Primary Care Provider: Raina Mina., MD Consultants: GI, pulm, gen surg Code Status: DNR  Pt Overview and Major Events to Date:  6/22: admitted, GI consulted 6/23: underwent EGD/colonoscopy 6/25: pulm consulted, underwent thoracentesis, gen surg consulted  Desiree [name] is a 75 y.o. female who presented with symptomatic anemia concerning for GI bleed. PMH significant for cirrhosis, chronic alcohol use, a-fib, COPD on 2L baseline O2, and CKD stage III.  Assessment and Plan:  Symptomatic Anemia, GI Bleed  Ascending Colon Mass Colonoscopy revealed mass in ascending colon, concerning for malignancy. Pathology pending. Hgb stable. -GI following, appreciate recommendations -Gen surg following, appreciate recommendations -Tentative plan for resection today vs tomorrow -f/u CEA results -Consult oncology when pathology results are obtained  Gallbladder Mass RUQ ultrasound with hypoechogenicity, sludge vs mass. -Gen surg following, appreciate recommendations  Paroxysmal A-Fib Currently in NSR. -Holding Eliquis -Continue home Carvedilol  Pulmonary Mass -Pulm following, appreciate recommendations -No additional testing needed inpatient at this time per pulm  FEN/GI: NPO PPx: None currently-- restart after OR Dispo:Home in 2-3 days. Barriers include OR for resection.   Subjective:  No acute events overnight. Patient feels well this morning. Denies complaints. Ambulating without difficulty.  Objective: Temp:  [97.8 F (36.6 C)-98.6 F (37 C)] 98.1 F (36.7 C) (06/27 0407) Pulse Rate:  [87-91] 89 (06/27 0407) Resp:  [17-23] 18 (06/27 0407) BP: (137-158)/(58-81) 137/58 (06/27 0407) SpO2:  [94 %-99 %] 94 % (06/27 0407) FiO2 (%):  [28 %] 28 % (06/27 0407) Physical  Exam: General: alert, resting comfortably in bed Cardiovascular: RRR, normal S1/S2 without m/r/g Respiratory: normal effort, lungs CTAB Abdomen: soft, nontender Extremities: no peripheral edema Neuro: no focal deficits  Laboratory: Recent Labs  Lab 07/25/20 1018 07/25/20 1823 07/26/20 0500 07/26/20 1258 07/29/20 0929 07/29/20 1725 07/30/20 0128  WBC 10.0  --  8.0  --   --   --   --   HGB 8.1*   < > 8.6*   < > 9.1* 8.5* 8.6*  HCT 25.8*   < > 27.6*   < > 29.7* 27.9* 27.9*  PLT 337  --  255  --   --   --   --    < > = values in this interval not displayed.   Recent Labs  Lab 07/25/20 1018 07/26/20 0500 07/27/20 0205  NA 132* 133* 134*  K 3.9 3.9 4.0  CL 93* 97* 100  CO2 30 29 26   BUN 7* 10 5*  CREATININE 0.75 0.80 0.67  CALCIUM 8.7* 8.5* 8.1*  PROT 5.8*  --   --   BILITOT 0.4  --   --   ALKPHOS 77  --   --   ALT 21  --   --   AST 29  --   --   GLUCOSE 90 94 92    Imaging/Diagnostic Tests: No results found.   Alcus Dad, MD 07/30/2020, 6:30 AM PGY-1, [place] Intern pager: [phone], text pages welcome

## 2020-07-30 NOTE — TOC Progression Note (Signed)
Transition of Care [facility]) - Progression Note    Patient Details  Name: Desiree [name] MRN: [id number] Date of Birth: [birth year]  Transition of Care [facility]) CM/SW Nettie, LCSW Phone Number: 07/30/2020, 1:17 PM  Clinical Narrative:    CSW updated [facility] that patient is not medically stable for discharge. Patient will require insurance authorization (and updated PT notes within 48 hours of submitting for insurance auth), updated COVID test within 24 hours of discharge, and a signed DNR for transport.    Expected Discharge Plan: Skilled Nursing Facility Barriers to Discharge: Ship broker, Continued Medical Work up  Expected Discharge Plan and Services Expected Discharge Plan: [place] In-house Referral: Clinical Social Work   Post Acute Care Choice: [place] Living arrangements for the past 2 months: [place]                                       Social Determinants of Health (SDOH) Interventions    Readmission Risk Interventions No flowsheet data found.

## 2020-07-30 NOTE — Progress Notes (Signed)
   NAME:  Desiree [name], MRN:  [id number], DOB:  [birth year], LOS: 4 ADMISSION DATE:  07/25/2020, CONSULTATION DATE:  07/28/20 REFERRING MD:  Erin Hearing, CHIEF COMPLAINT:  fatigue   History of Present Illness:  75 year old woman with hx of PE (2017) on AC, COPD on HOT, HTN, RA on DMARDs, alcohol use with question of cirrhosis presenting with fatigue, melena found to have anemia.  Further workup revealed an ascending colon mass.  Staging scan showed enlarging LLL pulmonary nodule that Dr. Valeta Harms knows about and is followed, new R>L pleural effusion, GB sludge vs. Mass.  PCCM consulted for further workup.  Currently MMRC 1 dyspnea, strength improving since admission with breathing treatments. Has some vague R shoulder pain over last several days. 40 pack year smoker.  Pertinent  Medical History  PE Afib HFPEF, hx reduced EF Right Pleural Effusion  LLL Pulmonary Nodule  COPD RA on [facility]  Significant Hospital Events: Including procedures, antibiotic start and stop dates in addition to other pertinent events   6/23 admitted 6/24 EGD/colonoscopy  Interim History / Subjective:  Pt reports she feels well, ready for surgery On 1L O2   Objective   Blood pressure (!) 166/82, pulse 95, temperature 98.2 F (36.8 C), temperature source Oral, resp. rate (!) 23, height 5\' 1"  (1.549 m), weight 54.2 kg, SpO2 93 %.    FiO2 (%):  [28 %] 28 %   Intake/Output Summary (Last 24 hours) at 07/30/2020 1315 Last data filed at 07/29/2020 1842 Gross per 24 hour  Intake 1490.42 ml  Output 700 ml  Net 790.42 ml   Filed Weights   07/27/20 1040 07/28/20 0500 07/29/20 0400  Weight: 54.4 kg 53.9 kg 54.2 kg    Examination: General:  adult female sitting up in chair in NAD HEENT: MM pink/moist, anicteric  Neuro: AAOx4, speech clear, MAE CV: s1s2 RRR, no m/r/g PULM: non-labored, R>L basilar crackles  GI: soft, bsx4 active, tolerating PO's   Extremities: warm/dry, no edema  Skin: no rashes or  lesions  Resolved Hospital Problem list      Assessment & Plan:   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 Suspect skin contaminant.  -narrow abx pending cultures  -follow up cultures and pending pleural cytology -left pleural nodule will eventually need biopsy when recovered from colon resection  -C-spine imaging does not suggest any instability  -continue PTA nebs  Best Practice (right click and "Reselect all SmartList Selections" daily)  Per primary      Noe Gens, MSN, APRN, NP-C, AGACNP-BC [facility] 07/30/2020, 1:15 PM   Please see [email or web address] for pager details.   From 7A-7P if no response, please call [phone] After hours, please call ELink [phone]

## 2020-07-31 ENCOUNTER — Inpatient Hospital Stay (HOSPITAL_COMMUNITY): Payer: Medicare PPO | Admitting: Certified Registered"

## 2020-07-31 ENCOUNTER — Other Ambulatory Visit: Payer: Self-pay

## 2020-07-31 ENCOUNTER — Encounter (HOSPITAL_COMMUNITY)
Admission: EM | Disposition: A | Discharge status: Skilled Nursing Facility | Payer: Self-pay | Source: Ambulatory Visit | Attending: Family Medicine

## 2020-07-31 HISTORY — PX: LAPAROSCOPIC RIGHT HEMI COLECTOMY: SHX5926

## 2020-07-31 LAB — CBC
HCT: 26.7 % — ABNORMAL LOW (ref 36.0–46.0)
Hemoglobin: 8.2 g/dL — ABNORMAL LOW (ref 12.0–15.0)
MCH: 28.7 pg (ref 26.0–34.0)
MCHC: 30.7 g/dL (ref 30.0–36.0)
MCV: 93.4 fL (ref 80.0–100.0)
Platelets: 287 10*3/uL (ref 150–400)
RBC: 2.86 MIL/uL — ABNORMAL LOW (ref 3.87–5.11)
RDW: 19.8 % — ABNORMAL HIGH (ref 11.5–15.5)
WBC: 6.9 10*3/uL (ref 4.0–10.5)
nRBC: 0 % (ref 0.0–0.2)

## 2020-07-31 LAB — MRSA NEXT GEN BY PCR, NASAL: MRSA by PCR Next Gen: NOT DETECTED

## 2020-07-31 LAB — PREPARE RBC (CROSSMATCH)

## 2020-07-31 SURGERY — LAPAROSCOPIC RIGHT HEMI COLECTOMY
Anesthesia: General | Site: Abdomen | Laterality: Right

## 2020-07-31 MED ORDER — ORAL CARE MOUTH RINSE: 15.0000 mL | Freq: Once | OROMUCOSAL | Status: AC

## 2020-07-31 MED ORDER — AMISULPRIDE (ANTIEMETIC) 5 MG/2ML IV SOLN: 5.0000 mg | Freq: Once | INTRAVENOUS | Status: DC | PRN

## 2020-07-31 MED ORDER — ONDANSETRON HCL 4 MG/2ML IJ SOLN
INTRAMUSCULAR | Status: AC
  Filled 2020-07-31: qty 2

## 2020-07-31 MED ORDER — PROPOFOL 10 MG/ML IV BOLUS
INTRAVENOUS | Status: DC | PRN
  Administered 2020-07-31: 100 mg via INTRAVENOUS
  Administered 2020-07-31: 60 mg via INTRAVENOUS
  Administered 2020-07-31: 10 mg via INTRAVENOUS

## 2020-07-31 MED ORDER — SODIUM CHLORIDE 0.9 % IR SOLN
Status: DC | PRN
  Administered 2020-07-31: 1000 mL

## 2020-07-31 MED ORDER — ONDANSETRON HCL 4 MG/2ML IJ SOLN: 4.0000 mg | Freq: Once | INTRAMUSCULAR | Status: DC | PRN

## 2020-07-31 MED ORDER — PROPOFOL 10 MG/ML IV BOLUS
INTRAVENOUS | Status: AC
  Filled 2020-07-31: qty 20

## 2020-07-31 MED ORDER — FENTANYL CITRATE (PF) 250 MCG/5ML IJ SOLN
INTRAMUSCULAR | Status: DC | PRN
  Administered 2020-07-31: 50 ug via INTRAVENOUS
  Administered 2020-07-31: 100 ug via INTRAVENOUS
  Administered 2020-07-31: 50 ug via INTRAVENOUS

## 2020-07-31 MED ORDER — MUPIROCIN 2 % EX OINT
1.0000 "application " | TOPICAL_OINTMENT | Freq: Two times a day (BID) | CUTANEOUS | Status: AC
  Administered 2020-07-31 – 2020-08-05 (×10): 1 via NASAL
  Filled 2020-07-31 (×4): qty 22

## 2020-07-31 MED ORDER — PHENYLEPHRINE 40 MCG/ML (10ML) SYRINGE FOR IV PUSH (FOR BLOOD PRESSURE SUPPORT)
PREFILLED_SYRINGE | INTRAVENOUS | Status: DC | PRN
  Administered 2020-07-31 (×3): 80 ug via INTRAVENOUS
  Administered 2020-07-31: 120 ug via INTRAVENOUS

## 2020-07-31 MED ORDER — OXYCODONE HCL 5 MG PO TABS
5.0000 mg | ORAL_TABLET | Freq: Four times a day (QID) | ORAL | Status: DC | PRN
  Administered 2020-07-31 – 2020-08-01 (×3): 5 mg via ORAL
  Filled 2020-07-31 (×3): qty 1

## 2020-07-31 MED ORDER — EPHEDRINE SULFATE-NACL 50-0.9 MG/10ML-% IV SOSY
PREFILLED_SYRINGE | INTRAVENOUS | Status: DC | PRN
  Administered 2020-07-31: 15 mg via INTRAVENOUS
  Administered 2020-07-31: 10 mg via INTRAVENOUS

## 2020-07-31 MED ORDER — ACETAMINOPHEN 325 MG PO TABS: 650.0000 mg | ORAL_TABLET | Freq: Four times a day (QID) | ORAL | Status: DC | PRN

## 2020-07-31 MED ORDER — FENTANYL CITRATE (PF) 100 MCG/2ML IJ SOLN: INTRAMUSCULAR | Status: DC | PRN

## 2020-07-31 MED ORDER — BUPIVACAINE-EPINEPHRINE (PF) 0.25% -1:200000 IJ SOLN
INTRAMUSCULAR | Status: AC
  Filled 2020-07-31: qty 30

## 2020-07-31 MED ORDER — ONDANSETRON HCL 4 MG/2ML IJ SOLN
INTRAMUSCULAR | Status: DC | PRN
  Administered 2020-07-31: 4 mg via INTRAVENOUS

## 2020-07-31 MED ORDER — CHLORHEXIDINE GLUCONATE 0.12 % MT SOLN
OROMUCOSAL | Status: AC
  Administered 2020-07-31: 15 mL via OROMUCOSAL
  Filled 2020-07-31: qty 15

## 2020-07-31 MED ORDER — ROCURONIUM BROMIDE 10 MG/ML (PF) SYRINGE
PREFILLED_SYRINGE | INTRAVENOUS | Status: DC | PRN
  Administered 2020-07-31: 60 mg via INTRAVENOUS

## 2020-07-31 MED ORDER — LACTATED RINGERS IV SOLN: INTRAVENOUS | Status: DC

## 2020-07-31 MED ORDER — PHENYLEPHRINE HCL (PRESSORS) 10 MG/ML IV SOLN: INTRAVENOUS | Status: DC | PRN

## 2020-07-31 MED ORDER — ACETAMINOPHEN 10 MG/ML IV SOLN: 1000.0000 mg | Freq: Once | INTRAVENOUS | Status: DC | PRN

## 2020-07-31 MED ORDER — LIDOCAINE 2% (20 MG/ML) 5 ML SYRINGE
INTRAMUSCULAR | Status: AC
  Filled 2020-07-31: qty 5

## 2020-07-31 MED ORDER — DEXAMETHASONE SODIUM PHOSPHATE 10 MG/ML IJ SOLN
INTRAMUSCULAR | Status: AC
  Filled 2020-07-31: qty 1

## 2020-07-31 MED ORDER — ROCURONIUM BROMIDE 10 MG/ML (PF) SYRINGE
PREFILLED_SYRINGE | INTRAVENOUS | Status: AC
  Filled 2020-07-31: qty 10

## 2020-07-31 MED ORDER — EPHEDRINE SULFATE 50 MG/ML IJ SOLN: INTRAMUSCULAR | Status: DC | PRN

## 2020-07-31 MED ORDER — CHLORHEXIDINE GLUCONATE 0.12 % MT SOLN: 15.0000 mL | Freq: Once | OROMUCOSAL | Status: AC

## 2020-07-31 MED ORDER — PHENYLEPHRINE HCL-NACL 10-0.9 MG/250ML-% IV SOLN
INTRAVENOUS | Status: DC | PRN
  Administered 2020-07-31: 25 ug/min via INTRAVENOUS

## 2020-07-31 MED ORDER — STERILE WATER FOR IRRIGATION IR SOLN
Status: DC | PRN
  Administered 2020-07-31: 1000 mL

## 2020-07-31 MED ORDER — FENTANYL CITRATE (PF) 100 MCG/2ML IJ SOLN
25.0000 ug | INTRAMUSCULAR | Status: DC | PRN
  Administered 2020-07-31: 50 ug via INTRAVENOUS

## 2020-07-31 MED ORDER — CHLORHEXIDINE GLUCONATE CLOTH 2 % EX PADS
6.0000 | MEDICATED_PAD | Freq: Every day | CUTANEOUS | Status: DC
  Administered 2020-07-31 – 2020-08-05 (×6): 6 via TOPICAL

## 2020-07-31 MED ORDER — FENTANYL CITRATE (PF) 250 MCG/5ML IJ SOLN
INTRAMUSCULAR | Status: AC
  Filled 2020-07-31: qty 5

## 2020-07-31 MED ORDER — DEXAMETHASONE SODIUM PHOSPHATE 10 MG/ML IJ SOLN
INTRAMUSCULAR | Status: DC | PRN
  Administered 2020-07-31: 5 mg via INTRAVENOUS

## 2020-07-31 MED ORDER — LIDOCAINE 2% (20 MG/ML) 5 ML SYRINGE
INTRAMUSCULAR | Status: DC | PRN
  Administered 2020-07-31: 60 mg via INTRAVENOUS

## 2020-07-31 MED ORDER — SUGAMMADEX SODIUM 200 MG/2ML IV SOLN
INTRAVENOUS | Status: DC | PRN
  Administered 2020-07-31: 150 mg via INTRAVENOUS

## 2020-07-31 MED ORDER — ROCURONIUM BROMIDE 100 MG/10ML IV SOLN: INTRAVENOUS | Status: DC | PRN

## 2020-07-31 MED ORDER — MORPHINE SULFATE (PF) 2 MG/ML IV SOLN
2.0000 mg | INTRAVENOUS | Status: DC | PRN
  Administered 2020-08-01: 2 mg via INTRAVENOUS
  Filled 2020-07-31: qty 1

## 2020-07-31 MED ORDER — BUPIVACAINE-EPINEPHRINE (PF) 0.25% -1:200000 IJ SOLN
INTRAMUSCULAR | Status: DC | PRN
  Administered 2020-07-31: 9 mL

## 2020-07-31 MED ORDER — FENTANYL CITRATE (PF) 100 MCG/2ML IJ SOLN
INTRAMUSCULAR | Status: AC
  Filled 2020-07-31: qty 2

## 2020-07-31 SURGICAL SUPPLY — 63 items
APL PRP STRL LF DISP 70% ISPRP (MISCELLANEOUS) ×1
CANISTER SUCT 3000ML PPV (MISCELLANEOUS) ×2 IMPLANT
CELLS DAT CNTRL 66122 CELL SVR (MISCELLANEOUS) IMPLANT
CHLORAPREP W/TINT 26 (MISCELLANEOUS) ×2 IMPLANT
CLSR STERI-STRIP ANTIMIC 1/2X4 (GAUZE/BANDAGES/DRESSINGS) ×2 IMPLANT
COVER MAYO STAND STRL (DRAPES) ×4 IMPLANT
COVER SURGICAL LIGHT HANDLE (MISCELLANEOUS) ×4 IMPLANT
COVER WAND RF STERILE (DRAPES) ×2 IMPLANT
DECANTER SPIKE VIAL GLASS SM (MISCELLANEOUS) ×2 IMPLANT
DRAPE HALF SHEET 40X57 (DRAPES) ×4 IMPLANT
DRAPE UTILITY XL STRL (DRAPES) ×2 IMPLANT
DRSG COVADERM PLUS 2X2 (GAUZE/BANDAGES/DRESSINGS) ×1 IMPLANT
DRSG OPSITE POSTOP 4X8 (GAUZE/BANDAGES/DRESSINGS) IMPLANT
DRSG TEGADERM 2-3/8X2-3/4 SM (GAUZE/BANDAGES/DRESSINGS) ×2 IMPLANT
DRSG TEGADERM 4X4.75 (GAUZE/BANDAGES/DRESSINGS) ×1 IMPLANT
ELECT BLADE 6.5 EXT (BLADE) ×2 IMPLANT
ELECT CAUTERY BLADE 6.4 (BLADE) ×2 IMPLANT
ELECT REM PT RETURN 9FT ADLT (ELECTROSURGICAL) ×2
ELECTRODE REM PT RTRN 9FT ADLT (ELECTROSURGICAL) ×1 IMPLANT
GAUZE 4X4 16PLY ~~LOC~~+RFID DBL (SPONGE) ×1 IMPLANT
GAUZE SPONGE 4X4 12PLY STRL (GAUZE/BANDAGES/DRESSINGS) ×1 IMPLANT
GLOVE SURG ENC MOIS LTX SZ7 (GLOVE) ×4 IMPLANT
GLOVE SURG UNDER POLY LF SZ7.5 (GLOVE) ×4 IMPLANT
GOWN STRL REUS W/ TWL LRG LVL3 (GOWN DISPOSABLE) ×6 IMPLANT
GOWN STRL REUS W/TWL LRG LVL3 (GOWN DISPOSABLE) ×12
KIT BASIN OR (CUSTOM PROCEDURE TRAY) ×2 IMPLANT
KIT TURNOVER KIT B (KITS) ×2 IMPLANT
LIGASURE IMPACT 36 18CM CVD LR (INSTRUMENTS) ×1 IMPLANT
LIGASURE VESSEL 5MM BLUNT TIP (ELECTROSURGICAL) ×1 IMPLANT
NS IRRIG 1000ML POUR BTL (IV SOLUTION) ×4 IMPLANT
PAD ARMBOARD 7.5X6 YLW CONV (MISCELLANEOUS) ×4 IMPLANT
PENCIL BUTTON HOLSTER BLD 10FT (ELECTRODE) ×4 IMPLANT
RELOAD PROXIMATE 75MM BLUE (ENDOMECHANICALS) ×2 IMPLANT
RELOAD STAPLE 75 3.8 BLU REG (ENDOMECHANICALS) IMPLANT
RETRACTOR WND ALEXIS 18 MED (MISCELLANEOUS) IMPLANT
RTRCTR WOUND ALEXIS 18CM MED (MISCELLANEOUS)
SCISSORS LAP 5X35 DISP (ENDOMECHANICALS) IMPLANT
SET IRRIG TUBING LAPAROSCOPIC (IRRIGATION / IRRIGATOR) ×1 IMPLANT
SET TUBE SMOKE EVAC HIGH FLOW (TUBING) ×2 IMPLANT
SHEARS HARMONIC ACE PLUS 36CM (ENDOMECHANICALS) ×1 IMPLANT
SLEEVE ENDOPATH XCEL 5M (ENDOMECHANICALS) ×2 IMPLANT
SPECIMEN JAR LARGE (MISCELLANEOUS) ×2 IMPLANT
SPONGE T-LAP 18X18 ~~LOC~~+RFID (SPONGE) ×3 IMPLANT
STAPLER GUN LINEAR PROX 60 (STAPLE) ×2 IMPLANT
STAPLER PROXIMATE 75MM BLUE (STAPLE) ×1 IMPLANT
STAPLER VISISTAT 35W (STAPLE) ×2 IMPLANT
SUT MNCRL AB 4-0 PS2 18 (SUTURE) ×1 IMPLANT
SUT PDS AB 1 CTX 36 (SUTURE) ×2 IMPLANT
SUT SILK 2 0 SH CR/8 (SUTURE) ×2 IMPLANT
SUT SILK 2 0 TIES 10X30 (SUTURE) ×2 IMPLANT
SUT SILK 3 0 SH CR/8 (SUTURE) ×2 IMPLANT
SUT SILK 3 0 TIES 10X30 (SUTURE) ×2 IMPLANT
SYR BULB IRRIG 60ML STRL (SYRINGE) ×4 IMPLANT
SYS LAPSCP GELPORT 120MM (MISCELLANEOUS) ×2
SYSTEM LAPSCP GELPORT 120MM (MISCELLANEOUS) IMPLANT
TOWEL GREEN STERILE (TOWEL DISPOSABLE) ×4 IMPLANT
TRAY FOLEY MTR SLVR 14FR STAT (SET/KITS/TRAYS/PACK) ×2 IMPLANT
TRAY LAPAROSCOPIC MC (CUSTOM PROCEDURE TRAY) ×2 IMPLANT
TROCAR XCEL NON-BLD 11X100MML (ENDOMECHANICALS) IMPLANT
TROCAR XCEL NON-BLD 5MMX100MML (ENDOMECHANICALS) ×2 IMPLANT
TUBE CONNECTING 12X1/4 (SUCTIONS) ×4 IMPLANT
WATER STERILE IRR 1000ML POUR (IV SOLUTION) ×2 IMPLANT
YANKAUER SUCT BULB TIP NO VENT (SUCTIONS) ×4 IMPLANT

## 2020-07-31 NOTE — Progress Notes (Signed)
Family Medicine Teaching Service Daily Progress Note Intern Pager: [phone]  Patient name: Desiree [name] Medical record number: [id number] Date of birth: 01/22/1946 Age: 75 y.o. Gender: female  Primary Care Provider: Raina Mina., MD Consultants: Frederik Schmidt, GI Code Status: DNR  Pt Overview and Major Events to Date:  6/22: admitted, GI consulted 6/23: underwent EGD/colonoscopy 6/25: pulm consulted, underwent thoracentesis, gen surg consulted  Desiree [name] is a 75 y.o. female who presented with symptomatic anemia concerning for GI bleed. PMH significant for cirrhosis, chronic alcohol use, a-fib, COPD on 2L baseline O2, and CKD stage III.  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 CT chest/abd/pelvis without evidence of mets. Spoke with oncology this morning who does not feel inpatient consult is necessary at this time. They will see her outpatient after discharge. -Gen surg following, plan for resection today -f/u CEA results  Pleural Effusion Pulmonary Nodule s/p thoracentesis on 6/25. Pleural fluid grew coagulase negative staph with neutrophil predominance. Likely contaminant however pulm recommending CTX x7 days. -Pulm signed off, appreciate their assistance -CTX x7 days per pulm -Outpatient pulm f/u for her nodule  Paroxysmal A-Fib Remains in NSR -Holding Eliquis, will resume per surgery -Continue home carvedilol  Gallbladder Abnormality on Imaging -Gen surg following, appreciate recommendations -Plan to evaluate gallbladder intraop during resection today -If no cholecystectomy, repeat ultrasound in 4-6 weeks  FEN/GI: NPO PPx: None currently- restart after OR. Encourage ambulation in the meantime Dispo:SNF in 3 or more days. Barriers include awaiting colon resection.   Subjective:  No acute events overnight. Patient feels well this morning. Denies complaints.   Objective: Temp:  [97.9 F (36.6  C)-98.5 F (36.9 C)] 98 F (36.7 C) (06/28 0600) Pulse Rate:  [88-97] 93 (06/28 0600) Resp:  [19-23] 19 (06/28 0600) BP: (134-166)/(62-90) 142/62 (06/28 0600) SpO2:  [93 %-96 %] 96 % (06/27 1939) FiO2 (%):  [28 %] 28 % (06/27 1939) Weight:  [60.6 kg] 60.6 kg (06/28 0600) Physical Exam: General: alert, sitting at side of bed, NAD Cardiovascular: RRR, normal S1/S2 Respiratory: normal effort, lungs CTAB Abdomen: soft, nontender Extremities: no peripheral edema Neuro: alert, oriented x3, no focal deficits  Laboratory: Recent Labs  Lab 07/25/20 1018 07/25/20 1823 07/26/20 0500 07/26/20 1258 07/30/20 0128 07/30/20 0904 07/31/20 0141  WBC 10.0  --  8.0  --   --   --  6.9  HGB 8.1*   < > 8.6*   < > 8.6* 9.0* 8.2*  HCT 25.8*   < > 27.6*   < > 27.9* 29.3* 26.7*  PLT 337  --  255  --   --   --  287   < > = values in this interval not displayed.   Recent Labs  Lab 07/25/20 1018 07/26/20 0500 07/27/20 0205  NA 132* 133* 134*  K 3.9 3.9 4.0  CL 93* 97* 100  CO2 30 29 26   BUN 7* 10 5*  CREATININE 0.75 0.80 0.67  CALCIUM 8.7* 8.5* 8.1*  PROT 5.8*  --   --   BILITOT 0.4  --   --   ALKPHOS 77  --   --   ALT 21  --   --   AST 29  --   --   GLUCOSE 90 94 92     Imaging/Diagnostic Tests: No results found.   Alcus Dad, MD 07/31/2020, 6:13 AM PGY-1, [place] Intern pager: [phone], text pages welcome

## 2020-07-31 NOTE — Anesthesia Preprocedure Evaluation (Signed)
Anesthesia Evaluation  Patient identified by MRN, date of birth, ID band Patient awake    Reviewed: Allergy & Precautions, NPO status , Patient's Chart, lab work & pertinent test results  Airway Mallampati: III  TM Distance: >3 FB Neck ROM: Full    Dental  (+) Missing   Pulmonary COPD,  COPD inhaler and oxygen dependent, former smoker, PE 2 L via [place]    Pulmonary exam normal breath sounds clear to auscultation       Cardiovascular hypertension, Pt. on medications and Pt. on home beta blockers + Peripheral Vascular Disease  Normal cardiovascular exam Rhythm:Regular Rate:Normal  ECG: SR, rate 98   Neuro/Psych PSYCHIATRIC DISORDERS Depression negative neurological ROS     GI/Hepatic Neg liver ROS, GERD  Medicated and Controlled,  Endo/Other  negative endocrine ROS  Renal/GU negative Renal ROS     Musculoskeletal  (+) Arthritis ,   Abdominal   Peds  Hematology  (+) anemia ,   Anesthesia Other Findings right colon mass  Reproductive/Obstetrics                             Anesthesia Physical Anesthesia Plan  ASA: 4  Anesthesia Plan: General   Post-op Pain Management:    Induction: Intravenous  PONV Risk Score and Plan: 4 or greater and Ondansetron, Dexamethasone and Treatment may vary due to age or medical condition  Airway Management Planned: Oral ETT  Additional Equipment:   Intra-op Plan:   Post-operative Plan: Possible Post-op intubation/ventilation and Extubation in OR  Informed Consent: I have reviewed the patients History and Physical, chart, labs and discussed the procedure including the risks, benefits and alternatives for the proposed anesthesia with the patient or authorized representative who has indicated his/her understanding and acceptance.   Patient has DNR.  Discussed DNR with patient and Suspend DNR.   Dental advisory given  Plan Discussed with:  CRNA  Anesthesia Plan Comments:         Anesthesia Quick Evaluation

## 2020-07-31 NOTE — Progress Notes (Signed)
VAST consult received to draw blood. Patient does not have a central line charted from which to draw blood. SecureChat sent to patient's nurse regarding clarification. After 10 mins without an answer, called unit and inquired about IVT consult. Advised IVT services are not needed at this time and to please cancel order.

## 2020-07-31 NOTE — Anesthesia Procedure Notes (Signed)
Procedure Name: Intubation Date/Time: 07/31/2020 10:06 AM Performed by: Lavonia Dana, CRNA Pre-anesthesia Checklist: Patient identified, Emergency Drugs available, Suction available and Patient being monitored Patient Re-evaluated:Patient Re-evaluated prior to induction Oxygen Delivery Method: Circle system utilized Preoxygenation: Pre-oxygenation with 100% oxygen Induction Type: IV induction Ventilation: Mask ventilation without difficulty Laryngoscope Size: 3 and Glidescope Grade View: Grade I Tube type: Oral Tube size: 7.0 mm Number of attempts: 1 Airway Equipment and Method: Stylet and Bite block Placement Confirmation: ETT inserted through vocal cords under direct vision, positive ETCO2 and breath sounds checked- equal and bilateral Secured at: 22 cm Tube secured with: Tape Dental Injury: Teeth and Oropharynx as per pre-operative assessment  Comments: DL with Mac 3 and Miller 2 produced Grade III view; switched to Glidescope with Grade I view, ETT passed easily through cords, placement verified with equal BBS and +ETCO2

## 2020-07-31 NOTE — Plan of Care (Signed)

## 2020-07-31 NOTE — Anesthesia Postprocedure Evaluation (Signed)
Anesthesia Post Note  Patient: Desiree [name]  Procedure(s) Performed: LAPAROSCOPIC ASSISSTED  RIGHT HEMI COLECTOMY (Right: Abdomen)     Patient location during evaluation: PACU Anesthesia Type: General Level of consciousness: awake Pain management: pain level controlled Vital Signs Assessment: post-procedure vital signs reviewed and stable Respiratory status: spontaneous breathing, nonlabored ventilation, respiratory function stable and patient connected to nasal cannula oxygen Cardiovascular status: blood pressure returned to baseline and stable Postop Assessment: no apparent nausea or vomiting Anesthetic complications: no   No notable events documented.  Last Vitals:  Vitals:   07/31/20 1315 07/31/20 1341  BP: 132/77 134/68  Pulse:  96  Resp: 17 20  Temp:  36.4 C  SpO2: 100% 97%    Last Pain:  Vitals:   07/31/20 1531  TempSrc:   PainSc: (P) 5                  Emalea Mix P Chanise Habeck

## 2020-07-31 NOTE — Op Note (Signed)
Preop diagnosis: Right colon tumor Postop diagnosis: Same Procedure performed: Laparoscopic assisted right hemicolectomy Surgeon:Sachit Gilman K Maegen Wigle Resident Assistant:  Dr. Lucas Mallow Anesthesia: General endotracheal Indications: This is a 75 year old female with multiple medical issues who presented with a lower GI bleed.  Work-up revealed a right colon mass.  Biopsy showed no obvious malignancy but did show atypia concerning for malignancy.  The bleeding has stopped.  The patient's hemoglobin is now 8.2.  Based on the size of the mass and the probable appearance of malignancy, we recommended a right hemicolectomy.  Description of procedure: The patient is brought to the operating room and placed in the supine position on the operating room table.  After an adequate level of general anesthesia was obtained, a Foley catheter was placed under sterile technique.  Her arms were tucked at her sides.  Her abdomen was prepped with ChloraPrep and draped sterile fashion.  A timeout was taken to ensure the proper patient and proper procedure.  We made a small midline incision just below the umbilicus.  I dissected down the fascia which was incised vertically.  We entered the peritoneal cavity.  A stay suture of 0 Vicryl was placed around the fascial opening.  The Hassan cannula was inserted and secured to stay suture.  Pneumoperitoneum was obtained by insufflating CO2 maintaining a maximum pressure of 15 mmHg.  The laparoscope was inserted and the patient was positioned tilted towards her left.  The area of the ascending colon that was marked with the [place] ink is clearly visible.  We examined the liver.  There are no obvious signs of masses on the liver.  The gallbladder appears grossly normal.  We placed 2 other 5 mm ports on the patient's left side.  The harmonic scalpel was then used to mobilize the right colon and the cecum away from the lateral abdominal wall.  Once we had fully mobilized the right colon  around the hepatic flexure to the mid transverse colon we released our insufflation.  I made a small midline incision around the umbilicus to include our previous umbilical port site.  A GelPort device was inserted.  We exteriorized the right colon and cecum as well as the proximal transverse colon.  The patient has a palpable lymph node in the mesentery of the right colon.  Once we are happy with our mobilization we divided the transverse colon with a GIA 75 stapler just proximal to the middle colic artery.  We divided the terminal ileum about 12 cm proximal to the ileocecal valve with another staple load of the GIA 75.  The LigaSure device was used to take down the mesentery.  The ileocolic vessel was tied off with a 2-0 silk suture.  We remove the entire specimen which included terminal ileum, cecum, appendix, and ascending colon.  This was sent for pathologic examination.  We then examined for hemostasis.  We created a side-to-side ileocolic anastomosis with a GIA-75 stapler.  The crotch of the anastomosis was reinforced with 3-0 silk suture.  There was an adjacent tongue of omentum from the transverse colon and this was sutured over the staple line.  No bleeding was noted.  The mesenteric defect was closed with 2-0 silk sutures.  We irrigated the abdomen thoroughly and inspected again for hemostasis.  The anastomosis was placed back in the peritoneal cavity.  At this point we changed our gowns and gloves in accordance with the colorectal protocol.  We again inspected for hemostasis.  The fascia of the midline  incision was reapproximated with #1 PDS suture.  The subcutaneous tissues were irrigated.  4-0 Monocryl was used to close all of the skin incisions.  Benzoin and Steri-Strips were applied.  The patient was then extubated and brought to the recovery room in stable condition.  All sponge, instrument, and needle counts are correct.  Imogene Burn. Georgette Dover, MD, [facility]  General  Surgery   07/31/2020 12:22 PM

## 2020-07-31 NOTE — Transfer of Care (Signed)
Immediate Anesthesia Transfer of Care Note  Patient: Desiree [name]  Procedure(s) Performed: LAPAROSCOPIC ASSISSTED  RIGHT HEMI COLECTOMY (Right: Abdomen)  Patient Location: PACU  Anesthesia Type:General  Level of Consciousness: awake, alert  and oriented  Airway & Oxygen Therapy: Patient Spontanous Breathing and Patient connected to face mask oxygen  Post-op Assessment: Report given to RN and Post -op Vital signs reviewed and stable  Post vital signs: Reviewed and stable  Last Vitals:  Vitals Value Taken Time  BP 128/79 07/31/20 1244  Temp    Pulse 88 07/31/20 1249  Resp 17 07/31/20 1249  SpO2 98 % 07/31/20 1249  Vitals shown include unvalidated device data.  Last Pain:  Vitals:   07/31/20 0818  TempSrc: Oral  PainSc:          Complications: No notable events documented.

## 2020-08-01 ENCOUNTER — Encounter (HOSPITAL_COMMUNITY): Payer: Self-pay | Admitting: Surgery

## 2020-08-01 LAB — CBC
HCT: 25.6 % — ABNORMAL LOW (ref 36.0–46.0)
HCT: 29.3 % — ABNORMAL LOW (ref 36.0–46.0)
Hemoglobin: 7.8 g/dL — ABNORMAL LOW (ref 12.0–15.0)
Hemoglobin: 9.3 g/dL — ABNORMAL LOW (ref 12.0–15.0)
MCH: 29 pg (ref 26.0–34.0)
MCH: 29.4 pg (ref 26.0–34.0)
MCHC: 30.5 g/dL (ref 30.0–36.0)
MCHC: 31.7 g/dL (ref 30.0–36.0)
MCV: 95.2 fL (ref 80.0–100.0)
MCV: 92.7 fL (ref 80.0–100.0)
Platelets: 265 10*3/uL (ref 150–400)
Platelets: 226 10*3/uL (ref 150–400)
RBC: 2.69 MIL/uL — ABNORMAL LOW (ref 3.87–5.11)
RBC: 3.16 MIL/uL — ABNORMAL LOW (ref 3.87–5.11)
RDW: 19.9 % — ABNORMAL HIGH (ref 11.5–15.5)
RDW: 19.1 % — ABNORMAL HIGH (ref 11.5–15.5)
WBC: 14.7 10*3/uL — ABNORMAL HIGH (ref 4.0–10.5)
WBC: 14.1 10*3/uL — ABNORMAL HIGH (ref 4.0–10.5)
nRBC: 0 % (ref 0.0–0.2)
nRBC: 0 % (ref 0.0–0.2)

## 2020-08-01 LAB — BASIC METABOLIC PANEL
Anion gap: 6 (ref 5–15)
BUN: <5 mg/dL — ABNORMAL LOW (ref 8–23)
CO2: 26 mmol/L (ref 22–32)
Calcium: 7.5 mg/dL — ABNORMAL LOW (ref 8.9–10.3)
Chloride: 104 mmol/L (ref 98–111)
Creatinine, Ser: 0.71 mg/dL (ref 0.44–1.00)
GFR, Estimated: >60 mL/min (ref >60)
Glucose, Bld: 102 mg/dL — ABNORMAL HIGH (ref 70–99)
Potassium: 3.5 mmol/L (ref 3.5–5.1)
Sodium: 136 mmol/L (ref 135–145)

## 2020-08-01 LAB — BODY FLUID CULTURE W GRAM STAIN

## 2020-08-01 LAB — SURGICAL PCR SCREEN
MRSA, PCR: NEGATIVE
Staphylococcus aureus: NEGATIVE

## 2020-08-01 LAB — PREPARE RBC (CROSSMATCH)

## 2020-08-01 MED ORDER — ENOXAPARIN SODIUM 40 MG/0.4ML IJ SOSY
40.0000 mg | PREFILLED_SYRINGE | INTRAMUSCULAR | Status: DC
  Administered 2020-08-01: 40 mg via SUBCUTANEOUS
  Filled 2020-08-01: qty 0.4

## 2020-08-01 MED ORDER — BOOST / RESOURCE BREEZE PO LIQD CUSTOM
1.0000 | Freq: Three times a day (TID) | ORAL | Status: DC
  Administered 2020-08-01 – 2020-08-04 (×7): 1 via ORAL

## 2020-08-01 MED ORDER — ADULT MULTIVITAMIN W/MINERALS CH
1.0000 | ORAL_TABLET | Freq: Every day | ORAL | Status: DC
  Administered 2020-08-01 – 2020-08-05 (×5): 1 via ORAL
  Filled 2020-08-01 (×5): qty 1

## 2020-08-01 MED ORDER — SODIUM CHLORIDE 0.9% IV SOLUTION: Freq: Once | INTRAVENOUS | Status: AC

## 2020-08-01 MED ORDER — KETOROLAC TROMETHAMINE 15 MG/ML IJ SOLN
15.0000 mg | Freq: Four times a day (QID) | INTRAMUSCULAR | Status: DC
  Administered 2020-08-01 (×2): 15 mg via INTRAVENOUS
  Filled 2020-08-01 (×2): qty 1

## 2020-08-01 MED ORDER — PROSOURCE PLUS PO LIQD
30.0000 mL | Freq: Two times a day (BID) | ORAL | Status: DC
  Administered 2020-08-01 – 2020-08-05 (×7): 30 mL via ORAL
  Filled 2020-08-01 (×7): qty 30

## 2020-08-01 NOTE — Progress Notes (Signed)
Occupational Therapy Treatment Patient Details Name: Desiree [name] MRN: [id number] DOB: 10-08-1945 Today's Date: 08/01/2020    History of present illness Pt is a 75 y/o female admitted 6/22 secondary to symptomatic anemia. Pt found to have colon mass that is worrisom for malignancy - scheduled for R hemicolectomy 07/31/20. PMH includes cirrhosis, a fib, COPD on 2L and CKD.   OT comments  Patient progressing and showed improved ability to stand at sink for grooming/hygiene  compared to previous session where pt required a seated position to complete grooming tasks. Patient remains limited by decreased safety awareness particularly with Sit<>Stand transitions as pt attempted to sit too early to recliner and required Moderate assist for safety, along with deficits noted below. Pt continues to demonstrate good rehab potential and would benefit from continued skilled OT to increase safety and independence with ADLs and functional transfers to allow pt to return home safely and reduce caregiver burden and fall risk.   Follow Up Recommendations  SNF    Equipment Recommendations  None recommended by OT    Recommendations for Other Services      Precautions / Restrictions Precautions Precautions: Fall Precaution Comments: watch O2 Restrictions Weight Bearing Restrictions: No       Mobility Bed Mobility               General bed mobility comments: in chair at arrival    Transfers Overall transfer level: Needs assistance Equipment used: Rolling walker (2 wheeled) Transfers: Sit to/from Stand (from recliner and from arm chair) Sit to Stand: Min assist         General transfer comment: Also see ADLs section. Min As to stand from recliner and armchair. Min As to lower to arm chair with cues for hands. Mod As to lower to recliner due to too early a sit. Increased education on safety provided.    Balance Overall balance assessment: Needs assistance Sitting-balance support: No  upper extremity supported Sitting balance-Leahy Scale: Fair     Standing balance support: Bilateral upper extremity supported Standing balance-Leahy Scale: Poor Standing balance comment: Reliant on BUE support and external assistance.                           ADL either performed or assessed with clinical judgement   ADL Overall ADL's : Needs assistance/impaired     Grooming: Supervision/safety;Standing;Oral care;Wash/dry hands;Brushing hair;Minimal assistance Grooming Details (indicate cue type and reason): Pt stood with unilateral forearm support on sink vanity and performed oral care with supervision. Min As to brush hair. Total time standing of ~8 min with one sitting rest break before ambulating back to chair.                   Toilet Transfer Details (indicate cue type and reason): See Mobility for t/f training. Pt denied need to void.         Functional mobility during ADLs: Minimal assistance;Min guard;Rolling walker;Cueing for sequencing General ADL Comments: Pt is used to her Rollator and pt's daughter reports that she will bring it tomorrow. Daughter reports that pt does not lock bracks on her Rollator at baseline and has habit of flopping unsafely into chairs. Pt educated on hand placement when standing and sitting, but pt still demonstrated a very unsafe sit when returning to recliner with need of Mod As for safety. Pt will need reinforcement.     Vision Baseline Vision/History: Wears glasses Wears Glasses: At all times  Perception     Praxis      Cognition Arousal/Alertness: Awake/alert Behavior During Therapy: WFL for tasks assessed/performed Overall Cognitive Status: Within Functional Limits for tasks assessed                                 General Comments: Very pleasant, daughter present.  Pt needs cues for safety        Exercises     Shoulder Instructions       General Comments      Pertinent Vitals/  Pain       Pain Assessment: Faces Faces Pain Scale: No hurt  Home Living                                          Prior Functioning/Environment              Frequency  Min 2X/week        Progress Toward Goals  OT Goals(current goals can now be found in the care plan section)  Progress towards OT goals: Progressing toward goals  Acute Rehab OT Goals Patient Stated Goal: to go to SNF to get stronger and then hopefully ALF OT Goal Formulation: With patient Time For Goal Achievement: 08/12/20 Potential to Achieve Goals: Good  Plan Discharge plan remains appropriate    Co-evaluation                 AM-PAC OT "6 Clicks" Daily Activity     Outcome Measure   Help from another person eating meals?: None Help from another person taking care of personal grooming?: A Little Help from another person toileting, which includes using toliet, bedpan, or urinal?: Total Help from another person bathing (including washing, rinsing, drying)?: A Little Help from another person to put on and taking off regular upper body clothing?: A Little Help from another person to put on and taking off regular lower body clothing?: A Little 6 Click Score: 17    End of Session Equipment Utilized During Treatment: Rolling walker;Oxygen (2L via [place]. Abdominal Binder)  OT Visit Diagnosis: Unsteadiness on feet (R26.81);Muscle weakness (generalized) (M62.81);Other symptoms and signs involving cognitive function   Activity Tolerance Patient tolerated treatment well   Patient Left in chair;with call bell/phone within reach;with chair alarm set;with family/visitor present   Nurse Communication Mobility status        Time: 3143-8887 OT Time Calculation (min): 32 min  Charges: OT General Charges $OT Visit: 1 Visit OT Treatments $Self Care/Home Management : 8-22 mins $Therapeutic Activity: 8-22 mins  Anderson Malta, [place] Office:  [phone] 08/01/2020   Julien Girt 08/01/2020, 3:10 PM

## 2020-08-01 NOTE — Plan of Care (Signed)
  Problem: Education: Goal: Knowledge of General Education information will improve Description: Including pain rating scale, medication(s)/side effects and non-pharmacologic comfort measures Outcome: Progressing   Problem: Clinical Measurements: Goal: Diagnostic test results will improve Outcome: Progressing Goal: Respiratory complications will improve Outcome: Progressing Goal: Cardiovascular complication will be avoided Outcome: Progressing   Problem: Safety: Goal: Ability to remain free from injury will improve Outcome: Progressing   Problem: Skin Integrity: Goal: Risk for impaired skin integrity will decrease Outcome: Progressing

## 2020-08-01 NOTE — Care Management Important Message (Signed)
Important Message  Patient Details  Name: Desiree [name] MRN: [id number] Date of Birth: February 04, 1946   Medicare Important Message Given:  Yes     Orbie Pyo 08/01/2020, 3:13 PM

## 2020-08-01 NOTE — Progress Notes (Addendum)
Family Medicine Teaching Service Daily Progress Note Intern Pager: [phone]  Patient name: Desiree [name] Medical record number: [id number] Date of birth: 08-Jun-1945 Age: 75 y.o. 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 Very sore but overall feels well. Hgb 7.8 this morning, surgery planning to transfuse 1u. CEA pending. -Gen surg following, appreciate recommendations -Transfuse 1u pRBCs, f/u repeat Hgb -Toradol 15mg  q6h scheduled -Oxy 5mg  q6h prn, morphine 2-4mg  q2h prn, Tylenol prn -Outpatient oncology f/u  Paroxysmal A-Fib Remains in NSR.  -Holding Eliquis. Appreciate surgery recs re: timing to resume AC -Continue home carvedilol  Pleural Effusion  s/p Thoracentesis Pulmonary Nodule Pleural fluid grew coagulase negative staph with neutrophil predominance. Likely contaminant, however pulm recommending CTX x7 days -CTX (day #3/7) -Outpatient pulm f/u  Gallbladder Abnormality ?sludge vs nodule on RUQ ultrasound. Gallbladder was inspected intra-operatively and appeared normal per the op-note. -Gen surg following, appreciate recommendations -Consider repeat ultrasound in 4-6 weeks  FEN/GI: NPO PPx: Lovenox for now (tentatively restart Eliquis tomorrow pending Hgb) Dispo:SNF in 2-3 days. Barriers include hemodynamically unstable, ongoing pain management.   Subjective:  No acute events overnight. Patient very sore but otherwise feels well this morning.  Objective: Temp:  [97.6 F (36.4 C)-98.9 F (37.2 C)] 97.9 F (36.6 C) (06/29 0400) Pulse Rate:   [90-101] 100 (06/29 0400) Resp:  [14-23] 19 (06/29 0400) BP: (127-169)/(66-79) 140/66 (06/29 0400) SpO2:  [96 %-100 %] 96 % (06/29 0400) FiO2 (%):  [28 %] 28 % (06/28 2357) Weight:  [60.5 kg-60.6 kg] 60.5 kg (06/29 0406) Physical Exam: General: alert, well-appearing, NAD Cardiovascular: RRR, normal S1/S2 Respiratory: normal effort Abdomen: +BS, soft, tenderness surrounding incision sites, dressings c/d/i Extremities: no peripheral edema Neuro: grossly intact  Laboratory: Recent Labs  Lab 07/26/20 0500 07/26/20 1258 07/30/20 0904 07/31/20 0141 08/01/20 0145  WBC 8.0  --   --  6.9 14.7*  HGB 8.6*   < > 9.0* 8.2* 7.8*  HCT 27.6*   < > 29.3* 26.7* 25.6*  PLT 255  --   --  287 265   < > = values in this interval not displayed.   Recent Labs  Lab 07/25/20 1018 07/26/20 0500 07/27/20 0205 08/01/20 0145  NA 132* 133* 134* 136  K 3.9 3.9 4.0 3.5  CL 93* 97* 100 104  CO2 30 29 26 26   BUN 7* 10 5* <5*  CREATININE 0.75 0.80 0.67 0.71  CALCIUM 8.7* 8.5* 8.1* 7.5*  PROT 5.8*  --   --   --   BILITOT 0.4  --   --   --   ALKPHOS 77  --   --   --   ALT 21  --   --   --   AST 29  --   --   --   GLUCOSE 90 94 92 102*     Imaging/Diagnostic Tests: No results found.   Alcus Dad, MD 08/01/2020, 7:43 AM PGY-1, [place] Intern pager: [phone], text pages welcome

## 2020-08-01 NOTE — Progress Notes (Signed)
1 Day Post-Op   Subjective/Chief Complaint: Very sore She prefers to use PO pain meds over IV pain meds, but I'm not sure that is completely adequate on POD #1 No BM or flatus No nausea or vomiting Hgb down to 7.8 from 8.2   Objective: Vital signs in last 24 hours: Temp:  [97.6 F (36.4 C)-98.9 F (37.2 C)] 98.4 F (36.9 C) (06/29 0801) Pulse Rate:  [96-101] 101 (06/29 0801) Resp:  [14-23] 18 (06/29 0801) BP: (127-155)/(58-79) 133/58 (06/29 0801) SpO2:  [96 %-100 %] 96 % (06/29 0801) FiO2 (%):  [28 %] 28 % (06/28 2357) Weight:  [60.5 kg] 60.5 kg (06/29 0406) Last BM Date: 07/30/20  Intake/Output from previous day: 06/28 0701 - 06/29 0700 In: 3274.8 [P.O.:120; I.V.:3054.8; IV Piggyback:100] Out: 6834 [Urine:1190; Blood:75] Intake/Output this shift: No intake/output data recorded.  General appearance: alert, cooperative, and no distress GI: Soft, incisional tenderness; incisions c/d/i No drainage noted on dressings  Lab Results:  Recent Labs    07/31/20 0141 08/01/20 0145  WBC 6.9 14.7*  HGB 8.2* 7.8*  HCT 26.7* 25.6*  PLT 287 265   BMET Recent Labs    08/01/20 0145  NA 136  K 3.5  CL 104  CO2 26  GLUCOSE 102*  BUN <5*  CREATININE 0.71  CALCIUM 7.5*   PT/INR No results for input(s): LABPROT, INR in the last 72 hours. ABG No results for input(s): PHART, HCO3 in the last 72 hours.  Invalid input(s): PCO2, PO2  Studies/Results: No results found.  Anti-infectives: Anti-infectives (From admission, onward)    Start     Dose/Rate Route Frequency Ordered Stop   07/30/20 1415  cefTRIAXone (ROCEPHIN) 2 g in sodium chloride 0.9 % 100 mL IVPB        2 g 200 mL/hr over 30 Minutes Intravenous Every 24 hours 07/30/20 1319 08/06/20 1414   07/26/20 1000  hydroxychloroquine (PLAQUENIL) tablet 200 mg        200 mg Oral 2 times daily 07/25/20 2036         Assessment/Plan: Ascending colon mass - s/p laparoscopic assisted right hemicolectomy 07/31/20 - Dr.  Georgette Dover - Path pending.  - CEA pending -afebrile, VSS with intermittent mild tachycardia. On 2 lpm O2 via [place] - Continue to hold anticoagulation for now - will recheck Hgb in AM.  If no sign of bleeding, may restart oral anticoagulation tomorrow.  OK to use Lovenox for VTE prophylaxis now. - Anemia - down slightly after surgery to 7/8.  S/p 1 unit RBCs 6/22.  Will give one more unit PRBC today. - Clear liquids - do not advance diet until she regains some bowel function   Abnormal gallbladder - CT with enhancing nodule. [place] with hypoechogenicity (sludge vs mass) - Gallbladder appeared normal at surgery  FEN: clear liquidsIVF per primary ID: none VTE: SCDs   Abdominal binder Mobilize - PT/OT Per primary: Atrial fibrillation with history fo PE - on eliquis at baseline Iron deficiency anemia, symptomatic - ferrlecit Alcohol abuse Pulmonary mass Tobacco use - 40 pack year history  LOS: 6 days    Maia Petties 08/01/2020

## 2020-08-01 NOTE — Progress Notes (Signed)
Initial Nutrition Assessment  DOCUMENTATION CODES:   Not applicable  INTERVENTION:   -Boost Breeze po TID, each supplement provides 250 kcal and 9 grams of protein  -30 ml Prosource Plus BID, each supplement provides 100 kcals and 15 grams protein -RD will follow for diet advancement and adjust supplement regimen as appropriate -If prolonged NPO/ clear liquid diet is anticipated, consider initiation of nutrition support  NUTRITION DIAGNOSIS:   Increased nutrient needs related to post-op healing as evidenced by estimated needs.  GOAL:   Patient will meet greater than or equal to 90% of their needs  MONITOR:   PO intake, Supplement acceptance, Diet advancement, Labs, Weight trends, Skin, I & O's  REASON FOR ASSESSMENT:   NPO/Clear Liquid Diet    ASSESSMENT:   Morningstar Toft is a 75 y.o. female presenting with symptomatic anemia and concern for GI bleed . 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  Pt admitted with symptomatic anemia.   6/24- s/p colonoscopy- revealed large mass in descending colon (biopsied); s/p EGD- revealed non-obstructing Schatzki ring at GE junction 6/25- s/p thoracentesis 6/28- s/p Laparoscopic assisted right hemicolectomy  Reviewed I/O's: +2 L: x 24 hours and +9.2 L since admission  UOP: 1.2 Lx  24 hours  Per general surgery notes, pathology and CEA pending. No plans to advance past clear liquids until return of bowel function.   Attempted to speak with pt, however, unavailable at time of visit.   Pt has receive inadequate oral nutrition (NPO/ clear liquids x 6 days) since admission. If prolonged clear liquid/ NPO status is anticipated, consider initiation of nutrition support.   Reviewed wt hx; wt has been stable over the past 2 months.   Medications reviewed and include prednisone and 0.9% sodium chloride infusion @ 75 ml/hr.   Labs reviewed.   Diet Order:   Diet  Order             Diet clear liquid Room service appropriate? Yes; Fluid consistency: Thin  Diet effective now                   EDUCATION NEEDS:   No education needs have been identified at this time  Skin:  Skin Assessment: Skin Integrity Issues: Skin Integrity Issues:: Incisions Incisions: closed abdomen  Last BM:  07/30/20  Height:   Ht Readings from Last 1 Encounters:  07/31/20 5\' 1"  (1.549 m)    Weight:   Wt Readings from Last 1 Encounters:  08/01/20 60.5 kg    Ideal Body Weight:  47.7 kg  BMI:  Body mass index is 25.2 kg/m.  Estimated Nutritional Needs:   Kcal:  1800-2000  Protein:  90-105 grams  Fluid:  > 1.8 L    Loistine Chance, RD, LDN, [place] Registered Dietitian II Certified Diabetes Care and Education Specialist Please refer to [facility] for RD and/or RD on-call/weekend/after hours pager

## 2020-08-01 NOTE — Progress Notes (Signed)
Physical Therapy Treatment Patient Details Name: Desiree [name] MRN: [id number] DOB: 1945/10/08 Today's Date: 08/01/2020    History of Present Illness Pt is a 75 y/o female admitted 6/22 secondary to symptomatic anemia. Pt found to have colon mass that is worrisom for malignancy - scheduled for R hemicolectomy 07/31/20. PMH includes cirrhosis, a fib, COPD on 2L and CKD.    PT Comments    Patient received in bed, very pleasant and cooperative; daughter present and supportive during session. Did require up to [facility] for bed mobility post-op, and then needed Mina/Mod VC for safe navigation and gait in room. Easily fatigued. Definitely has impaired safety awareness- at one point tried to spin hips to sit on bed when she was too far away and needed heavy MinA to maintain balance. VSS on 2LPM. Slightly dizzy with position changes but BP not orthostatic today. Left up in recliner with all needs met, alarm active and daughter present. Continue to recommend SNF.    Follow Up Recommendations  SNF     Equipment Recommendations  None recommended by PT    Recommendations for Other Services       Precautions / Restrictions Precautions Precautions: Fall Precaution Comments: watch O2 and BP, abdominal binder, abdominal incisions Restrictions Weight Bearing Restrictions: No    Mobility  Bed Mobility Overal bed mobility: Needs Assistance Bed Mobility: Rolling;Sidelying to Sit Rolling: Min assist Sidelying to sit: Mod assist       General bed mobility comments: MinA for good technique with rolling, then ModA to power up to midline sitting at EOB    Transfers Overall transfer level: Needs assistance Equipment used: Rolling walker (2 wheeled) Transfers: Sit to/from Stand Sit to Stand: Min assist         General transfer comment: MinA to boost and gain balance, tends to self select narrow BOS and becomes unsteady. Very unsafe with stand to sit as she tends to prematurely swing hips around  before she is close enough to the seat  Ambulation/Gait Ambulation/Gait assistance: Min assist Gait Distance (Feet): 10 Feet Assistive device: Rolling walker (2 wheeled) Gait Pattern/deviations: Step-to pattern;Decreased stride length;Trunk flexed Gait velocity: Decreased   General Gait Details: MinA for RW management and balance- tends to push it too far ahead especially on turns. Easily fatigued   Stairs             Wheelchair Mobility    Modified Rankin (Stroke Patients Only)       Balance Overall balance assessment: Needs assistance Sitting-balance support: Feet supported;No upper extremity supported Sitting balance-Leahy Scale: Good Sitting balance - Comments: statically   Standing balance support: Bilateral upper extremity supported;During functional activity Standing balance-Leahy Scale: Poor Standing balance comment: Reliant on BUE support and external assistance.                            Cognition Arousal/Alertness: Awake/alert Behavior During Therapy: WFL for tasks assessed/performed Overall Cognitive Status: Within Functional Limits for tasks assessed                                 General Comments: Very pleasant, daughter present.  Pt needs cues for safety      Exercises      General Comments General comments (skin integrity, edema, etc.): VSS on 2LPM O2      Pertinent Vitals/Pain Pain Assessment: Faces Faces Pain Scale: No hurt Pain  Intervention(s): Limited activity within patient's tolerance;Monitored during session    Home Living                      Prior Function            PT Goals (current goals can now be found in the care plan section) Acute Rehab PT Goals Patient Stated Goal: to go to SNF to get stronger and then hopefully ALF PT Goal Formulation: With patient/family Time For Goal Achievement: 08/09/20 Potential to Achieve Goals: Good Progress towards PT goals: Progressing toward  goals    Frequency    Min 3X/week      PT Plan Frequency needs to be updated;Current plan remains appropriate    Co-evaluation              AM-PAC PT "6 Clicks" Mobility   Outcome Measure  Help needed turning from your back to your side while in a flat bed without using bedrails?: A Little Help needed moving from lying on your back to sitting on the side of a flat bed without using bedrails?: A Lot Help needed moving to and from a bed to a chair (including a wheelchair)?: A Little Help needed standing up from a chair using your arms (e.g., wheelchair or bedside chair)?: A Little Help needed to walk in hospital room?: A Little Help needed climbing 3-5 steps with a railing? : A Lot 6 Click Score: 16    End of Session Equipment Utilized During Treatment: Gait belt Activity Tolerance: Patient tolerated treatment well Patient left: in chair;with call bell/phone within reach;with chair alarm set;Other (comment);with family/visitor present (OT present and attending) Nurse Communication: Mobility status PT Visit Diagnosis: Unsteadiness on feet (R26.81);Muscle weakness (generalized) (M62.81)     Time: 1638-4665 PT Time Calculation (min) (ACUTE ONLY): 32 min  Charges:  $Gait Training: 8-22 mins $Therapeutic Activity: 8-22 mins                    Windell Norfolk, DPT, PN1   Supplemental Physical Therapist [place]    Pager [phone] Acute Rehab Office [phone]

## 2020-08-02 ENCOUNTER — Other Ambulatory Visit: Payer: Self-pay

## 2020-08-02 ENCOUNTER — Encounter (HOSPITAL_COMMUNITY): Payer: Self-pay | Admitting: Family Medicine

## 2020-08-02 LAB — TYPE AND SCREEN
ABO/RH(D): O POS
Antibody Screen: NEGATIVE
Unit division: 0
Unit division: 0

## 2020-08-02 LAB — BASIC METABOLIC PANEL
Anion gap: 6 (ref 5–15)
BUN: 8 mg/dL (ref 8–23)
CO2: 26 mmol/L (ref 22–32)
Calcium: 7.5 mg/dL — ABNORMAL LOW (ref 8.9–10.3)
Chloride: 104 mmol/L (ref 98–111)
Creatinine, Ser: 0.73 mg/dL (ref 0.44–1.00)
GFR, Estimated: >60 mL/min (ref >60)
Glucose, Bld: 90 mg/dL (ref 70–99)
Potassium: 3.8 mmol/L (ref 3.5–5.1)
Sodium: 136 mmol/L (ref 135–145)

## 2020-08-02 LAB — BPAM RBC
Blood Product Expiration Date: 202207282359
Blood Product Expiration Date: 202207312359
ISSUE DATE / TIME: 202206291213
Unit Type and Rh: 5100
Unit Type and Rh: 5100

## 2020-08-02 LAB — CBC
HCT: 27.8 % — ABNORMAL LOW (ref 36.0–46.0)
Hemoglobin: 8.6 g/dL — ABNORMAL LOW (ref 12.0–15.0)
MCH: 29.2 pg (ref 26.0–34.0)
MCHC: 30.9 g/dL (ref 30.0–36.0)
MCV: 94.2 fL (ref 80.0–100.0)
Platelets: 211 10*3/uL (ref 150–400)
RBC: 2.95 MIL/uL — ABNORMAL LOW (ref 3.87–5.11)
RDW: 19.2 % — ABNORMAL HIGH (ref 11.5–15.5)
WBC: 9.9 10*3/uL (ref 4.0–10.5)
nRBC: 0 % (ref 0.0–0.2)

## 2020-08-02 LAB — SARS CORONAVIRUS 2 (TAT 6-24 HRS): SARS Coronavirus 2: NEGATIVE

## 2020-08-02 MED ORDER — PANTOPRAZOLE SODIUM 40 MG PO TBEC
40.0000 mg | DELAYED_RELEASE_TABLET | Freq: Every day | ORAL | Status: DC
  Administered 2020-08-02 – 2020-08-03 (×2): 40 mg via ORAL
  Filled 2020-08-02 (×2): qty 1

## 2020-08-02 MED ORDER — APIXABAN 2.5 MG PO TABS
2.5000 mg | ORAL_TABLET | Freq: Two times a day (BID) | ORAL | Status: DC
  Administered 2020-08-02 – 2020-08-05 (×7): 2.5 mg via ORAL
  Filled 2020-08-02 (×7): qty 1

## 2020-08-02 MED ORDER — CEFDINIR 300 MG PO CAPS
300.0000 mg | ORAL_CAPSULE | Freq: Two times a day (BID) | ORAL | Status: DC
  Administered 2020-08-02 – 2020-08-05 (×7): 300 mg via ORAL
  Filled 2020-08-02 (×7): qty 1

## 2020-08-02 MED ORDER — PANTOPRAZOLE SODIUM 20 MG PO TBEC: 20.0000 mg | DELAYED_RELEASE_TABLET | Freq: Every day | ORAL | Status: DC

## 2020-08-02 NOTE — Progress Notes (Signed)
Physical Therapy Treatment Patient Details Name: Desiree [name] MRN: [id number] DOB: 10-10-1945 Today's Date: 08/02/2020    History of Present Illness Pt is a 75 y/o female admitted 6/22 secondary to symptomatic anemia. Pt found to have colon mass that is worrisom for malignancy - scheduled for R hemicolectomy 07/31/20. PMH includes cirrhosis, a fib, COPD on 2L and CKD.    PT Comments    Patient received in bed, pleasant and cooperative with therapy. Did a bit better getting out of bed today, however still needs heavy cues for technique for returning to supine. Tolerated progression of gait distance today, however still unsteady and quickly becomes SOB. VSS on 3LPM. Continues to require heavy VC for safe use of rollator including placement for transfers and locking/unlocking brakes. Left in bed with all needs met, alarm active and daughter present. Continue to recommend SNF.    Follow Up Recommendations  SNF     Equipment Recommendations  None recommended by PT    Recommendations for Other Services       Precautions / Restrictions Precautions Precautions: Fall Precaution Comments: watch O2 and BP, abdominal binder, abdominal incisions Restrictions Weight Bearing Restrictions: No    Mobility  Bed Mobility Overal bed mobility: Needs Assistance Bed Mobility: Rolling;Sidelying to Sit;Sit to Sidelying Rolling: Supervision Sidelying to sit: Min assist     Sit to sidelying: Mod assist General bed mobility comments: still needs up to Mod cues for technique with rolling/side to sit but able to perform better today- did need ModA and heavy cues for sequencing for return to supine    Transfers Overall transfer level: Needs assistance Equipment used: 4-wheeled walker Transfers: Sit to/from Stand Sit to Stand: Min assist         General transfer comment: MinA to boost to standing and gain balance, self selects narrow BOS. Cues for hand placement and to lock  rollator  Ambulation/Gait Ambulation/Gait assistance: Min guard Gait Distance (Feet): 100 Feet Assistive device: 4-wheeled walker Gait Pattern/deviations: Step-through pattern;Decreased step length - right;Decreased step length - left;Trunk flexed;Narrow base of support Gait velocity: Decreased   General Gait Details: min guard for safety, occasional MinA for balance when turning with rollator. Easily fatigued and SOB but VSS on 3LPM O2   Stairs             Wheelchair Mobility    Modified Rankin (Stroke Patients Only)       Balance Overall balance assessment: Needs assistance Sitting-balance support: Feet supported;No upper extremity supported Sitting balance-Leahy Scale: Good Sitting balance - Comments: statically   Standing balance support: Bilateral upper extremity supported;During functional activity Standing balance-Leahy Scale: Poor Standing balance comment: Reliant on BUE support and external assistance.                            Cognition Arousal/Alertness: Awake/alert Behavior During Therapy: WFL for tasks assessed/performed Overall Cognitive Status: Within Functional Limits for tasks assessed                                 General Comments: Very pleasant, daughter present.  Pt needs cues for safety      Exercises      General Comments General comments (skin integrity, edema, etc.): VSS on 3LPM O2      Pertinent Vitals/Pain Pain Assessment: Faces Faces Pain Scale: Hurts little more Pain Location: abdomen Pain Descriptors / Indicators: Aching Pain  Intervention(s): Limited activity within patient's tolerance;Monitored during session    Home Living                      Prior Function            PT Goals (current goals can now be found in the care plan section) Acute Rehab PT Goals Patient Stated Goal: to go to SNF to get stronger and then hopefully ALF PT Goal Formulation: With patient/family Time For  Goal Achievement: 08/09/20 Potential to Achieve Goals: Good Progress towards PT goals: Progressing toward goals    Frequency    Min 3X/week      PT Plan Current plan remains appropriate    Co-evaluation              AM-PAC PT "6 Clicks" Mobility   Outcome Measure  Help needed turning from your back to your side while in a flat bed without using bedrails?: A Little Help needed moving from lying on your back to sitting on the side of a flat bed without using bedrails?: A Little Help needed moving to and from a bed to a chair (including a wheelchair)?: A Little Help needed standing up from a chair using your arms (e.g., wheelchair or bedside chair)?: A Little Help needed to walk in hospital room?: A Little Help needed climbing 3-5 steps with a railing? : A Lot 6 Click Score: 17    End of Session Equipment Utilized During Treatment: Gait belt;Oxygen Activity Tolerance: Patient tolerated treatment well Patient left: in bed;with call bell/phone within reach;with bed alarm set;with family/visitor present Nurse Communication: Mobility status PT Visit Diagnosis: Unsteadiness on feet (R26.81);Muscle weakness (generalized) (M62.81)     Time: 6226-3335 PT Time Calculation (min) (ACUTE ONLY): 25 min  Charges:  $Gait Training: 8-22 mins $Therapeutic Activity: 8-22 mins                    Windell Norfolk, DPT, PN1   Supplemental Physical Therapist [place]    Pager [phone] Acute Rehab Office [phone]

## 2020-08-02 NOTE — Progress Notes (Signed)
Family Medicine Teaching Service Daily Progress Note Intern Pager: [phone]  Patient name: Desiree [name] Medical record number: [id number] Date of birth: 01/12/1946 Age: 75 y.o. Gender: female  Primary Care Provider: Raina Mina., MD Consultants: gen surg, GI (s/o), pulm (s/o) Code Status: DNR  Pt Overview and Major Events to Date:  6/22: admitted, GI consulted, transfused 1u pRBCs 6/23: underwent EGD/colonoscopy 6/25: pulm consulted, underwent thoracentesis, gen surg consulted 6/28: underwent right hemicolectomy 6/29: transfused 1u pRBCs  Desiree [name] is a 75 y.o. female who presented with melena and symptomatic anemia, and was found to have ascending colon mass. PMH significant for cirrhosis, a-fib on Eliquis, hx of PE, rheumatoid arthritis, COPD on 2L baseline O2, CKD stage III.  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 Hgb 8.6 this morning (minimal drop from prior, 9.3). VSS. Patient feels well. Surgical pathology pending, but path from colonoscopy shows atypia suspicious for adenocarcinoma. CEA pending as well. -Gen surg following, appreciate recommendations -Tylenol 650mg  q6h prn for mild pain -Oxycodone 5mg  q6h prn for moderate pain -Morphine 2-4mg  q2h prn for severe pain -Abdominal binder -PT/OT -Outpatient oncology f/u  Paroxysmal A-Fib  Hx of PE Remains in NSR with HR 80-90s. -OK to resume Eliquis per surgery -Continue home carvedilol  Pleural Effusion s/p thoracentesis  Pulmonary Nodule Pleural fluid grew coag negative staph. Pulm recommended 7 day course of Ceftriaxone.  -Continue Ceftriaxone (day #4/7) -Outpatient pulm follow up for nodule  Gallbladder Abnormality RUQ u/s showed sludge vs mass. Gallbladder appeared normal intra-op -Consider repeat RUQ u/s in the future  HTN Chronic, stable. BP minimally elevated this morning at 148/63 but has  been overall normotensive. -Continue home amlodipine 5mg  daily and irbesartan (takes valsartan 300mg  daily at home which is non-formulary) -Monitor BP  Rheumatoid Arthritis Chronic, stable. -Continue home prednisone, plaquenil, and leflunomide  COPD Chronic, stable -Albuterol prn -Breztri -Continue home O2 prn (2L)  Alcohol Use  Cirrhosis No evidence of withdrawal during hospitalization  FEN/GI: Soft diet, anticipate advancing diet this afternoon PPx: Resume home Eliquis Dispo:SNF tomorrow. Barriers include need to check Hgb after resuming Eliquis.   Subjective:  No acute events overnight. Patient feels well this morning. No abdominal pain at rest but pain with coughing and when using her ab muscles to sit up. Had 2 bowel movements overnight. Urinating ok. Tolerating liquids without difficulty.  Objective: Temp:  [97.8 F (36.6 C)-99.1 F (37.3 C)] 97.8 F (36.6 C) (06/30 0517) Pulse Rate:  [88-101] 92 (06/30 0517) Resp:  [16-19] 19 (06/30 0517) BP: (121-141)/(51-87) 135/57 (06/30 0517) SpO2:  [96 %-98 %] 98 % (06/30 0517) Physical Exam: General: alert, sitting comfortably in bed, NAD Cardiovascular: RRR, normal S1/S2 without m/r/g Respiratory: normal effort on 2L [place], scattered expiratory wheezes Abdomen: soft, minimal tenderness around incision sites, binder in place Extremities: no peripheral edema Neuro: no focal deficits  Laboratory: Recent Labs  Lab 08/01/20 0145 08/01/20 1904 08/02/20 0148  WBC 14.7* 14.1* 9.9  HGB 7.8* 9.3* 8.6*  HCT 25.6* 29.3* 27.8*  PLT 265 226 211   Recent Labs  Lab 07/27/20 0205 08/01/20 0145 08/02/20 0148  NA 134* 136 136  K 4.0 3.5 3.8  CL 100 104 104  CO2 26 26 26   BUN 5* <5* 8  CREATININE 0.67 0.71 0.73  CALCIUM 8.1* 7.5* 7.5*  GLUCOSE 92 102* 90     Imaging/Diagnostic Tests: No results found.   Alcus Dad, MD  08/02/2020, 7:52 AM PGY-1, [place] Intern pager: [phone], text pages  welcome

## 2020-08-02 NOTE — Progress Notes (Signed)
2 Days Post-Op   Subjective/Chief Complaint: Pain is much better controlled today Had several loose bowel movements overnight Received 1 u PRBC - feels stronger PT recommends SNF placement   Objective: Vital signs in last 24 hours: Temp:  [97.8 F (36.6 C)-99.1 F (37.3 C)] 97.8 F (36.6 C) (06/30 0517) Pulse Rate:  [88-101] 92 (06/30 0517) Resp:  [16-19] 19 (06/30 0517) BP: (121-141)/(51-87) 135/57 (06/30 0517) SpO2:  [96 %-98 %] 98 % (06/30 0517) Last BM Date: 07/30/20  Intake/Output from previous day: 06/29 0701 - 06/30 0700 In: 2006.2 [P.O.:240; I.V.:1222.4; Blood:443.8; IV Piggyback:100] Out: 200 [Urine:200] Intake/Output this shift: No intake/output data recorded.  General appearance: alert, cooperative, and no distress GI: Incisional tenderness Incisions c/d/i  Lab Results:  Recent Labs    08/01/20 1904 08/02/20 0148  WBC 14.1* 9.9  HGB 9.3* 8.6*  HCT 29.3* 27.8*  PLT 226 211   BMET Recent Labs    08/01/20 0145 08/02/20 0148  NA 136 136  K 3.5 3.8  CL 104 104  CO2 26 26  GLUCOSE 102* 90  BUN <5* 8  CREATININE 0.71 0.73  CALCIUM 7.5* 7.5*   PT/INR No results for input(s): LABPROT, INR in the last 72 hours. ABG No results for input(s): PHART, HCO3 in the last 72 hours.  Invalid input(s): PCO2, PO2  Studies/Results: No results found.  Anti-infectives: Anti-infectives (From admission, onward)    Start     Dose/Rate Route Frequency Ordered Stop   07/30/20 1415  cefTRIAXone (ROCEPHIN) 2 g in sodium chloride 0.9 % 100 mL IVPB        2 g 200 mL/hr over 30 Minutes Intravenous Every 24 hours 07/30/20 1319 08/06/20 1414   07/26/20 1000  hydroxychloroquine (PLAQUENIL) tablet 200 mg        200 mg Oral 2 times daily 07/25/20 2036         Assessment/Plan: Ascending colon mass - s/p laparoscopic assisted right hemicolectomy 07/31/20 - Dr. Georgette Dover - Path pending. - CEA pending -afebrile, VSS with intermittent mild tachycardia. On 2 lpm O2 via  [place] - May restart oral anticoagulation - Anemia - 1 u PRBC; Hgb 8.6 - Advance diet as tolerated   Abnormal gallbladder - CT with enhancing nodule. [place] with hypoechogenicity (sludge vs mass) - Gallbladder appeared normal at surgery   FEN: Advance diet/ IVF per primary ID: none VTE: SCDs   Abdominal binder Mobilize - PT/OT - recommending SNF placement Per primary: Atrial fibrillation with history fo PE - on eliquis at baseline Iron deficiency anemia, symptomatic - ferrlecit Alcohol abuse Pulmonary mass Tobacco use - 40 pack year history  LOS: 7 days    Maia Petties 08/02/2020

## 2020-08-02 NOTE — TOC Progression Note (Signed)
Transition of Care [facility]) - Progression Note    Patient Details  Name: Desiree [name] MRN: [id number] Date of Birth: [birth year]  [facility] CM/SW [place], LCSW Phone Number: 08/02/2020, 9:11 AM  Clinical Narrative:    CSW submitted clinicals to [facility]: Ref# [id number]. COVID test ordered [facility]  able to accept patient tomorrow and are requesting CSW ask patient if she is ok with getting a COVID booster shot once she arrives there.    Expected Discharge Plan: Skilled Nursing Facility Barriers to Discharge: Ship broker, Continued Medical Work up  Expected Discharge Plan and Services Expected Discharge Plan: [place] In-house Referral: Clinical Social Work   Post Acute Care Choice: [place] Living arrangements for the past 2 months: [place]                                       Social Determinants of Health (SDOH) Interventions    Readmission Risk Interventions No flowsheet data found.

## 2020-08-03 ENCOUNTER — Inpatient Hospital Stay (HOSPITAL_COMMUNITY): Payer: Medicare PPO

## 2020-08-03 DIAGNOSIS — Z86711 Personal history of pulmonary embolism: Secondary | ICD-10-CM

## 2020-08-03 DIAGNOSIS — J449 Chronic obstructive pulmonary disease, unspecified: Secondary | ICD-10-CM

## 2020-08-03 LAB — CBC
HCT: 29.2 % — ABNORMAL LOW (ref 36.0–46.0)
Hemoglobin: 9.2 g/dL — ABNORMAL LOW (ref 12.0–15.0)
MCH: 29.3 pg (ref 26.0–34.0)
MCHC: 31.5 g/dL (ref 30.0–36.0)
MCV: 93 fL (ref 80.0–100.0)
Platelets: 241 10*3/uL (ref 150–400)
RBC: 3.14 MIL/uL — ABNORMAL LOW (ref 3.87–5.11)
RDW: 18.8 % — ABNORMAL HIGH (ref 11.5–15.5)
WBC: 8.9 10*3/uL (ref 4.0–10.5)
nRBC: 0 % (ref 0.0–0.2)

## 2020-08-03 LAB — CEA: CEA: 8.8 ng/mL — ABNORMAL HIGH (ref 0.0–4.7)

## 2020-08-03 MED ORDER — CEFDINIR 300 MG PO CAPS: 300.0000 mg | ORAL_CAPSULE | Freq: Two times a day (BID) | ORAL | 0 refills | Status: DC

## 2020-08-03 MED ORDER — ONDANSETRON 4 MG PO TBDP
4.0000 mg | ORAL_TABLET | Freq: Three times a day (TID) | ORAL | Status: DC
  Administered 2020-08-03 – 2020-08-04 (×2): 4 mg via ORAL
  Filled 2020-08-03 (×2): qty 1

## 2020-08-03 MED ORDER — PANTOPRAZOLE SODIUM 40 MG PO TBEC
40.0000 mg | DELAYED_RELEASE_TABLET | Freq: Two times a day (BID) | ORAL | Status: DC
  Administered 2020-08-03 – 2020-08-05 (×4): 40 mg via ORAL
  Filled 2020-08-03 (×4): qty 1

## 2020-08-03 MED ORDER — TRAMADOL HCL 50 MG PO TABS: 50.0000 mg | ORAL_TABLET | Freq: Four times a day (QID) | ORAL | 0 refills | Status: DC | PRN

## 2020-08-03 MED ORDER — ONDANSETRON 4 MG PO TBDP
4.0000 mg | ORAL_TABLET | Freq: Three times a day (TID) | ORAL | Status: DC | PRN
  Filled 2020-08-03: qty 1

## 2020-08-03 NOTE — Progress Notes (Signed)
Occupational Therapy Treatment Patient Details Name: Desiree [name] MRN: [id number] DOB: Jun 25, 1945 Today's Date: 08/03/2020    History of present illness Pt is a 75 y/o female admitted 6/22 secondary to symptomatic anemia. Pt found to have colon mass that is worrisom for malignancy - scheduled for R hemicolectomy 07/31/20. PMH includes cirrhosis, a fib, COPD on 2L and CKD.   OT comments  Patient progressing and showed improved ability to perform her own peri hygiene post toileting on bedside commode [facility]) with Min As, compared to previous session when pt required Total Assist. Patient remains limited by nausea and vomiting which pt reports began today as well as fatigue and generalized weakness, along with deficits noted below. Pt continues to demonstrate good rehab potential and would benefit from continued skilled OT to increase safety and independence with ADLs and functional transfers to allow pt to return home safely and reduce caregiver burden and fall risk.   Follow Up Recommendations  SNF    Equipment Recommendations  None recommended by OT    Recommendations for Other Services      Precautions / Restrictions Precautions Precautions: Fall Precaution Comments: watch O2 and BP, abdominal binder, abdominal incisions Restrictions Weight Bearing Restrictions: No       Mobility Bed Mobility Overal bed mobility: Needs Assistance Bed Mobility: Rolling;Sidelying to Sit Rolling: Supervision Sidelying to sit: Min assist            Transfers Overall transfer level: Needs assistance Equipment used: 1 person hand held assist (Pt declined use of rollator) Transfers: Sit to/from [facility] Sit to Stand: Min assist Stand pivot transfers: Mod assist       General transfer comment: MinA to power up to standing from EOB.  1 HHA with Mod As to pivot from EOB>BSC>Recliner. Near constant cues for transition to recliner and to back up until BLEs at chair before  initiating sit. Cues for BUE reach back for controlled descent.    Balance Overall balance assessment: Needs assistance Sitting-balance support: Feet supported;No upper extremity supported Sitting balance-Leahy Scale: Good Sitting balance - Comments: statically   Standing balance support: During functional activity;Single extremity supported Standing balance-Leahy Scale: Poor Standing balance comment: Stooped, reliant on external assist and 1HHA. Would have likely done better with Rollator but pt refused it this session.                           ADL either performed or assessed with clinical judgement   ADL Overall ADL's : Needs assistance/impaired   Eating/Feeding Details (indicate cue type and reason): Pt was able to use hospital phone and dial food and nutrition to order her lunch. Pt following daughter's instructions to order bland food due to nausea and vomiting today. Grooming: Sitting;Wash/dry hands Grooming Details (indicate cue type and reason): Pt too fatigued after toileting to stand at sink. Pt assisted from [facility] to recliner and provided with setup of warm washclothes for hand hgyiene.                 Toilet Transfer: Stand-pivot;BSC;Moderate Print production planner Details (indicate cue type and reason): Pt declined use of her rollator to pivot to [facility]. Pt stood from EOB with Min HHA. Pivoted to [facility] with Moderate HHA. Pt very stooped. Toileting- Clothing Manipulation and Hygiene: Minimal assistance;Sit to/from stand Toileting - Clothing Manipulation Details (indicate cue type and reason): Pt able to stand from [facility] with Min As/ HHA and performed peri hygiene (anterior  only) with Min As for balance and clothing management. and pt performing tasks.     Functional mobility during ADLs: Minimal assistance;Moderate assistance General ADL Comments: See Mobility     Vision   Vision Assessment?: No apparent visual deficits   Perception     Praxis       Cognition Arousal/Alertness: Awake/alert Behavior During Therapy: WFL for tasks assessed/performed Overall Cognitive Status: Within Functional Limits for tasks assessed                                          Exercises General Exercises - Upper Extremity Wrist Flexion: AROM Wrist Extension: AROM;AAROM;15 reps;Right (hand rotation at wrist with frequent cues tactile and verbal, to increased RT wrist extension. Pt's daughter requested OT look at pt's hand function as pt with recent difficulty opening containers and bags of chips.)   Shoulder Instructions       General Comments      Pertinent Vitals/ Pain       Pain Assessment: No/denies pain Pain Location: Denied pain and endorsed nausea Pain Intervention(s): Limited activity within patient's tolerance;Monitored during session  Home Living                                          Prior Functioning/Environment              Frequency  Min 2X/week        Progress Toward Goals  OT Goals(current goals can now be found in the care plan section)  Progress towards OT goals: Progressing toward goals  Acute Rehab OT Goals Patient Stated Goal: to go to SNF to get stronger and then hopefully ALF OT Goal Formulation: With patient Time For Goal Achievement: 08/12/20 Potential to Achieve Goals: Good  Plan Discharge plan remains appropriate    Co-evaluation                 AM-PAC OT "6 Clicks" Daily Activity     Outcome Measure   Help from another person eating meals?: None Help from another person taking care of personal grooming?: A Little Help from another person toileting, which includes using toliet, bedpan, or urinal?: A Little Help from another person bathing (including washing, rinsing, drying)?: A Little Help from another person to put on and taking off regular upper body clothing?: A Little Help from another person to put on and taking off regular lower body clothing?:  A Little 6 Click Score: 19    End of Session Equipment Utilized During Treatment: Oxygen;Gait belt (ABD binder)  OT Visit Diagnosis: Unsteadiness on feet (R26.81);Muscle weakness (generalized) (M62.81);Other symptoms and signs involving cognitive function   Activity Tolerance Patient tolerated treatment well   Patient Left in chair;with call bell/phone within reach;with chair alarm set   Nurse Communication          Time: [phone] OT Time Calculation (min): 27 min  Charges: OT General Charges $OT Visit: 1 Visit OT Treatments $Self Care/Home Management : 8-22 mins $Therapeutic Activity: 8-22 mins  Anderson Malta, [place] Office: [phone] 08/03/2020  Julien Girt 08/03/2020, 2:48 PM

## 2020-08-03 NOTE — TOC Progression Note (Addendum)
Transition of Care [facility]) - Progression Note    Patient Details  Name: Desiree [name] MRN: [id number] Date of Birth: 1945-10-24  Transition of Care [facility]) CM/SW [place], LCSW Phone Number: 08/03/2020, 10:05 AM  Clinical Narrative:    Insurance approval received: Ref #[id number], Josem Kaufmann #[id number], effective 08/02/2020-08/06/2020.   Per [facility], they can accept patient Sunday if needed but not Saturday.   Patient's daughter showed CSW a hospital bill that was denied by [facility]. CSW provided a medical records consent form to be able to appeal the denial. She stated patient has received two COVID vaccine boosters and she has the card so no additional booster is needed for [facility].   Expected Discharge Plan: Skilled Nursing Facility Barriers to Discharge: Ship broker, Continued Medical Work up  Expected Discharge Plan and Services Expected Discharge Plan: [place] In-house Referral: Clinical Social Work   Post Acute Care Choice: [place] Living arrangements for the past 2 months: [place]                                       Social Determinants of Health (SDOH) Interventions    Readmission Risk Interventions No flowsheet data found.

## 2020-08-03 NOTE — Progress Notes (Signed)
Family Medicine Teaching Service Daily Progress Note Intern Pager: [phone]  Patient name: Desiree [name] Medical record number: [id number] Date of birth: 01-30-1946 Age: 75 y.o. Gender: female  Primary Care Provider: Raina Mina., MD Consultants: general surgery, GI, pulmonary Code Status: DNR  Pt Overview and Major Events to Date:  6/22-admitted 6/25-thoracentesis 6/28-right hemicolectomy 6/29-transfused 1 unit pRBCs  Assessment and Plan:  Ascending Colon Mass s/p right hemicolectomy POD #3 s/p right hemicolectomy.  One epigastric suture, one LUQ suture and LLQ suture with dried blood, no drainage, no edema or erythema and steri strips intact. Hgb 9.2 this morning. VSS.  Pt vomited yesterday after lunch and again this morning after breakfast. Pt will not be discharged today. -Gen Surg following -Tylenol 650 mg qh6 prn -Oxycodone 5 mg qh6 prn -Morphine 2-4mg  qh2 prn (last used 6/29) -Abdominal binder -PT/OT -Out patient oncology f/u -Zofran -EKG  Paroxysmal A-Fib -Continue home Eliquis 2.5 mg BID -Continue home Carvedilol   FEN/GI: Normal diet PPx: home eliquis 2.5 mg BID Dispo:SNF pending clinical improvement .   Subjective:  Pt states she feels well over all.  She vomited the spaghetti she had for lunch yesterday afternoon and also the eggs, grits, bacon and peaches she had for breakfast this morning.  She felt slightly nauseous right before she vomited.  She was advised to eat food low in acidity today.  Objective: Temp:  [97.3 F (36.3 C)-98.7 F (37.1 C)] 97.7 F (36.5 C) (07/01 0741) Pulse Rate:  [90-94] 90 (07/01 0741) Resp:  [17-20] 17 (07/01 0741) BP: (153-170)/(70-81) 170/81 (07/01 0741) SpO2:  [95 %-99 %] 98 % (07/01 0847) FiO2 (%):  [28 %] 28 % (07/01 0515) Physical Exam: General: NAD, resting comfortably Cardiovascular: RRR, normal S1/S2 Respiratory: scattered wheezes with expiration  Abdomen: soft, binder in place, minimal  tenderness Extremities: no LE edema  Laboratory: Recent Labs  Lab 08/01/20 1904 08/02/20 0148 08/03/20 0058  WBC 14.1* 9.9 8.9  HGB 9.3* 8.6* 9.2*  HCT 29.3* 27.8* 29.2*  PLT 226 211 241   Recent Labs  Lab 08/01/20 0145 08/02/20 0148  NA 136 136  K 3.5 3.8  CL 104 104  CO2 26 26  BUN <5* 8  CREATININE 0.71 0.73  CALCIUM 7.5* 7.5*  GLUCOSE 102* 90      Precious Gilding, DO 08/03/2020, 11:51 AM PGY-1, [place] Intern pager: [phone], text pages welcome

## 2020-08-03 NOTE — Progress Notes (Addendum)
Went to see pt after being notified by nurse that she vomited.  She was found resting comfortably in her chair. No respiratory distress, no active complaints. She states she felt slightly nauseous before eating and then vomited after eating.  Abdomen was soft and slightly tender to palpation near incisions.  Normal bowel sounds present. Abdominal x ray has been ordered to r/o ileus.

## 2020-08-03 NOTE — Discharge Summary (Signed)
[facility] Discharge Summary  Patient name: Desiree [name] Medical record number: [id number] Date of birth: [birth year] Age: 75 y.o. Gender: female Date of Admission: 07/25/2020  Date of Discharge: 08/05/2020 Admitting Physician: Lind Covert, MD  Primary Care Provider: Raina Mina., MD Consultants: General surgery, gastroenterology, pulmonology  Indication for Hospitalization: Symptomatic anemia  Discharge Diagnoses/Problem List:  Symptomatic anemia Colonic mass, concerning for malignancy S/p right hemicolectomy Pleural effusion s/p thoracentesis Paroxysmal atrial fibrillation Hypertension COPD Rheumatoid arthritis Chronic shoulder dislocation  Disposition: SNF  Discharge Condition: Stable  Discharge Exam:  General: Awake, alert, well-appearing, no acute distress, in good spirits Respiratory: Alert respirations, lungs clear to auscultation bilaterally Cardiac: Irregularly irregular rhythm consistent with known A. fib, no murmurs appreciated Abdominal: Abdominal binder in place, epigastric incision site appears clean, dry, intact with small amount of dried blood, no redness, swelling, discharge  Brief Hospital Course:  Desiree [name] is a 75 y.o. female who presented with symptomatic anemia and concern for GI bleed. 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  Symptomatic Anemia  GI Bleed  Melena Patient admitted with symptomatic anemia and persistent melena with hemoglobin of 8.2 on admission.  Patient was transfused 1 unit PRBC and home Eliquis was held.  GI was consulted and patient underwent colonoscopy on 6/23 which showed a likely malignant mass in the ascending colon.  Biopsy showed atypia suspicious for adenocarcinoma.  CEA elevated at 6.8.  CT chest, abdomen and pelvis showed no concern for metastasis but have several other findings which are described in  the problems below. Patient underwent right hemicolectomy on 6/28. She started eating POD #2 with intermittent vomiting on POD #2 and 3. Producing urine, BM, and flatus. Zofran provided, improved nausea and ability to eat. By day of discharge, able to tolerate meals without zofran, so long as she is up in chair to eat.  Pulmonary mass, possible neoplasm CT chest, abdomen and pelvis showed progressive enlargement of a bilobed pulmonary nodule within the LLL demonstrating slow but progressive enlargement since remote PET CT of 09/09/2017 likely representing an indolent primary bronchogenic neoplasm. Pulmonology consulted who recommended thoracentesis for the right pleural effusion, which grew coagulase negative staph. Patient treated with Ceftriaxone followed by Cefdinir for a 7 day total course. Needs outpatient pulm follow up for possible biopsy.    Gallbladder mass CT chest, abdomen and pelvis showed a 12 mm enhancing nodule within the gallbladder fundus, possible mural mass or gallbladder polyp. RUQ [place] was obtained and showed small hypoechoic area that could reflect sludge vs mass. The gallbladder was inspected intra-operatively during right hemicolectomy and was normal in appearance. Could consider repeat ultrasound in 6 weeks.  Chronic R Shoulder Dislocation Noted on CT chest/abdomen/pelvis. Patient does not have shoulder pain or functional limitations. Will recommend outpatient orthopedics for follow up, eval and treat.   Issues for Follow Up:  Repeat RUQ [place] in 4 to 6 weeks to follow-up small hypoechoic area in the gallbladder fundus that was 1.4 cm. Ambulatory referral to oncology at discharge Consider increasing Eliquis to 5mg  BID if appropriate. Her last creatinine was less than 1.5, she weighs more than 60 kg, and is younger than 75 years old.  Recommend patient see ortho in outpatient setting for right shoulder posterior dislocation Surgery f/u as instructed [facility]  Surgery) Pulm and oncology f/u for pulm nodule  GI and oncology f/u for colon cancer Consider dose reduce  of plaquinil and allegra if appropriate.  Significant Procedures: Right hemicolectomy  Significant Labs and Imaging:  Recent Labs  Lab 08/03/20 0058 08/04/20 0125 08/05/20 0114  WBC 8.9 7.5 9.2  HGB 9.2* 9.3* 9.1*  HCT 29.2* 28.9* 28.6*  PLT 241 252 275   Recent Labs  Lab 08/01/20 0145 08/02/20 0148 08/04/20 0125 08/04/20 1309 08/05/20 0114  NA 136 136 137 134* 132*  K 3.5 3.8 2.8* 5.0 3.8  CL 104 104 102 100 98  CO2 26 26 30 24 27   GLUCOSE 102* 90 100* 111* 88  BUN <5* 8 5* 8 7*  CREATININE 0.71 0.73 0.56 0.58 0.56  CALCIUM 7.5* 7.5* 8.2* 8.4* 8.2*  MG  --   --   --  1.3* 1.6*  ALKPHOS  --   --  69  --   --   AST  --   --  18  --   --   ALT  --   --  17  --   --   ALBUMIN  --   --  2.1*  --   --     Upper endoscopy 07/27/20 A non-obstructing Schatzki ring was found at the gastroesophageal junction. The exam was otherwise without abnormality. The cardia and gastric fundus were normal on retroflexion.  Colonoscopy 07/27/20 The perianal and digital rectal examinations were normal. A non-obstructing large mass was found in the ascending colon. The mass was non-circumferential. This was biopsied with a cold forceps for histology. Biopsies for histology were taken with a cold forceps for evaluation of microscopic colitis. Verification of patient identification for the specimen was done. Estimated blood loss was minimal. Area was tattooed with an injection of 3 mL of Spot (carbon black).  PORTABLE CHEST 1 VIEW 07/25/20 COMPARISON:  07/16/2020, CT 11/22/2019 FINDINGS: Possible trace pleural effusions. Hazy atelectasis right base. No consolidation. Stable cardiomediastinal silhouette with aortic atherosclerosis. No pneumothorax. IMPRESSION: Slightly low lung volume with possible trace pleural effusion and hazy atelectasis right base  CT CHEST, ABDOMEN, AND PELVIS  WITH CONTRAST 07/28/20 IMPRESSION: Circumferential focal thickening involving the ascending colon likely representing the patient's known primary malignancy. No evidence of metastatic disease within the chest, abdomen, and pelvis.   Progressive enlargement of a bilobed pulmonary nodule within the left lower lobe demonstrating slow but progressive enlargement since remote PET CT of 09/09/2017 likely representing an indolent primary bronchogenic neoplasm.   Interval development of small to moderate right pleural effusion. Associated progressive bronchial wall thickening in keeping with airway inflammation. Together, these findings may reflect changes of recent infection. No superimposed focal pulmonary infiltrate.   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   Mild centrilobular emphysema.   Chronic right glenohumeral posterior dislocation with associated large right effusion.   Moderate distal colonic diverticulosis. No superimposed focal inflammatory change.   12 mm enhancing nodule within the gallbladder fundus. This may represent a mural mass or gallbladder polyp. Dedicated sonography is recommended for further evaluation.   Stable bilateral ovarian cysts, metabolically negative on prior PET CT examination of 09/09/2017, safely considered benign given their stability over time. No follow-up imaging recommended. Note: This recommendation does not apply to premenarchal patients and to those with increased risk (genetic, family history, elevated tumor markers or other high-risk factors) of ovarian cancer. Reference: JACR 2020 Feb; 17(2):248-254   Advanced degenerative change within the lumbar  spine, asymmetrically more severe at L2-3 with resultant moderate central canal stenosis and bilateral  neuroforaminal narrowing, not optimally profiled on this examination. This could be better assessed with MRI examination, if indicated.  CHEST  1 VIEW 07/28/20 FINDINGS: The cardiomediastinal silhouette is within normal limits for portable AP technique. Lung volumes are low with similar appearance of hazy right basilar opacity compatible with atelectasis. There are likely persistent small right and trace left pleural effusions. No pneumothorax is identified. IMPRESSION: Low lung volumes with unchanged right basilar atelectasis and persistent small pleural effusions. No pneumothorax.  CERVICAL SPINE COMPLETE WITH FLEXION AND EXTENSION VIEWS 07/28/20 IMPRESSION: 1. No fracture or acute finding. 2. Degenerative changes as detailed. 3. Grade 1 anterolisthesis of C3 and C4 and C4 on C5 with mild subluxation at both levels with flexion and extension as detailed above. No evidence of atlantoaxial instability.  ULTRASOUND ABDOMEN LIMITED RIGHT UPPER QUADRANT 07/28/20 IMPRESSION: 1. Small hypoechoic area in the gallbladder fundus, 1.4 cm in greatest dimension. Malignancy is not excluded although this could reflect an area of tumefactive sludge. Recommend short-term follow-up with repeat ultrasound in 4-6 weeks. Alternatively, this could be further assessed and characterized with liver MRI without and with contrast. 2. Geographic area of relative hyperechogenicity along the left liver lobe of unclear etiology. There is no correlate on the current CT scan, making a mass un likely. This may reflect an area of focal fatty infiltration. This could also be further assessed with liver MRI. 3. Small liver cysts. 4. No acute findings.  Results/Tests Pending at Time of Discharge: None  Discharge Medications:  Allergies as of 08/05/2020       Reactions   Other Shortness Of Breath   Cats        Medication List     TAKE these medications    acetaminophen 500 MG tablet Commonly known as:  TYLENOL Take 500 mg by mouth every 6 (six) hours as needed for moderate pain or headache.   albuterol 108 (90 Base) MCG/ACT inhaler Commonly known as: VENTOLIN HFA Inhale 2 puffs into the lungs every 4 (four) hours as needed for wheezing or shortness of breath.   amLODipine 5 MG tablet Commonly known as: NORVASC Take 1 tablet (5 mg total) by mouth daily.   apixaban 2.5 MG Tabs tablet Commonly known as: Eliquis Take 1 tablet (2.5 mg total) by mouth 2 (two) times daily.   atorvastatin 20 MG tablet Commonly known as: LIPITOR Take 1 tablet (20 mg total) by mouth daily.   Breztri Aerosphere 160-9-4.8 MCG/ACT Aero Generic drug: Budeson-Glycopyrrol-Formoterol Inhale 2 puffs into the lungs 2 (two) times daily.   carvedilol 3.125 MG tablet Commonly known as: COREG Take 1 tablet (3.125 mg total) by mouth 2 (two) times daily with a meal.   cefdinir 300 MG capsule Commonly known as: OMNICEF Take 1 capsule (300 mg total) by mouth every 12 (twelve) hours for 1 dose.   cholecalciferol 25 MCG (1000 UNIT) tablet Commonly known as: VITAMIN D Take 1,000 Units by mouth daily.   ferrous sulfate 325 (65 FE) MG EC tablet Take 325 mg by mouth daily with breakfast.   fexofenadine 180 MG tablet Commonly known as: ALLEGRA Take 180 mg by mouth daily.   hydroxychloroquine 200 MG tablet Commonly known as: PLAQUENIL Take 200 mg by mouth 2 (two) times daily.   leflunomide 20 MG tablet Commonly known as: ARAVA Take 20 mg by mouth daily.   montelukast 10 MG tablet Commonly known as: SINGULAIR Take 1  tablet (10 mg total) by mouth at bedtime. What changed: when to take this   omeprazole 20 MG capsule Commonly known as: PRILOSEC Take 1 capsule (20 mg total) by mouth daily.   ondansetron 4 MG disintegrating tablet Commonly known as: ZOFRAN-ODT Take 1 tablet (4 mg total) by mouth every 8 (eight) hours as needed for nausea or vomiting.   predniSONE 5 MG tablet Commonly known as:  DELTASONE Take 5 mg by mouth daily with breakfast.   sertraline 100 MG tablet Commonly known as: ZOLOFT Take 100 mg by mouth daily.   valsartan 320 MG tablet Commonly known as: DIOVAN Take 320 mg by mouth daily.   vitamin B-12 1000 MCG tablet Commonly known as: CYANOCOBALAMIN Take 1,000 mcg by mouth daily.         Discharge Instructions: Please refer to Patient Instructions section of EMR for full details.  Patient was counseled important signs and symptoms that should prompt return to medical care, changes in medications, dietary instructions, activity restrictions, and follow up appointments.   Follow-Up Appointments:  Follow-up Information     Donnie Mesa, MD Follow up in 2 week(s).   Specialty: General Surgery Why: follow up on 7/11 at 8:30 am Please arrive 30 minutes prior to your appointment time to complete check in and bring photo id and insurance card Contact information: [place] [id number] [phone]                 Ezequiel Essex, MD 08/05/2020, 11:55 AM PGY-2, [place]

## 2020-08-03 NOTE — Discharge Instructions (Addendum)
Dear Desiree [name],  Thank you for letting us participate in your care. You were hospitalized for anemia for blood loss.  A mass was found and removed from your colon.   POST-HOSPITAL & CARE INSTRUCTIONS We recommend you see your orthopedist for your right shoulder dislocation Your PCP should refer you to pulmonology and oncology to follow up on the pulmonary nodule Follow up with Gastroenterology and oncology for your likely colon cancer Follow up with general surgery after your abdominal surgery.  Go to your follow up appointments (listed below)   DOCTOR'S APPOINTMENT   No future appointments.  Follow-up Information     Desiree Mesa, MD Follow up in 2 week(s).   Specialty: General Surgery Why: follow up on 7/11 at 8:30 am Please arrive 30 minutes prior to your appointment time to complete check in and bring photo id and insurance card Contact information: [place] [id number] [phone]                 Take care and be well!  [facility]  [place], [place] [address] [phone]         CCS ______[facility] LAPAROSCOPIC SURGERY: POST OP INSTRUCTIONS Always review your discharge instruction sheet given to you by the facility where your surgery was performed. IF YOU HAVE DISABILITY OR FAMILY LEAVE FORMS, YOU MUST BRING THEM TO THE OFFICE FOR PROCESSING.   DO NOT GIVE THEM TO YOUR DOCTOR.  A prescription for pain medication may be given to you upon discharge.  Take your pain medication as prescribed, if needed.  If narcotic pain medicine is not needed, then you may take acetaminophen (Tylenol) or ibuprofen (Advil) as needed. Take your usually prescribed medications unless otherwise directed. If you need a refill on your pain medication, please contact your pharmacy.  They will contact our office to request  authorization. Prescriptions will not be filled after 5pm or on week-ends. You should follow a light diet the first few days after arrival home, such as soup and crackers, etc.  Be sure to include lots of fluids daily. Most patients will experience some swelling and bruising in the area of the incisions.  Ice packs will help.  Swelling and bruising can take several days to resolve.  It is common to experience some constipation if taking pain medication after surgery.  Increasing fluid intake and taking a stool softener (such as Colace) will usually help or prevent this problem from occurring.  A mild laxative (Milk of Magnesia or Miralax) should be taken according to package instructions if there are no bowel movements after 48 hours. Unless discharge instructions indicate otherwise, you may remove your bandages 24-48 hours after surgery, and you may shower at that time.  You may have steri-strips (small skin tapes) in place directly over the incision.  These strips should be left on the skin for 7-10 days.  If your surgeon used skin glue on the incision, you may shower in 24 hours.  The glue will flake off over the next 2-3 weeks.  Any sutures or staples will be removed at the office during your follow-up visit. ACTIVITIES:  You may resume regular (light) daily activities beginning the next day--such as daily self-care, walking, climbing stairs--gradually increasing activities as tolerated.  You may have sexual intercourse when it is comfortable.  Refrain from any heavy lifting or straining until approved  by your doctor. You may drive when you are no longer taking prescription pain medication, you can comfortably wear a seatbelt, and you can safely maneuver your car and apply brakes. RETURN TO WORK:  __________________________________________________________ [name] should see your doctor in the office for a follow-up appointment approximately 2-3 weeks after your surgery.  Make sure that you call for this  appointment within a day or two after you arrive home to insure a convenient appointment time. OTHER INSTRUCTIONS: __________________________________________________________________________________________________________________________ __________________________________________________________________________________________________________________________ WHEN TO CALL YOUR DOCTOR: Fever over 101.0 Inability to urinate Continued bleeding from incision. Increased pain, redness, or drainage from the incision. Increasing abdominal pain  The clinic staff is available to answer your questions during regular business hours.  Please don't hesitate to call and ask to speak to one of the nurses for clinical concerns.  If you have a medical emergency, go to the nearest emergency room or call 911.  A surgeon from [facility] is always on call at the hospital. [address], [place], [place], [place]  [address] ? [address] [place], [place], [place]   [address] [phone] ? [phone] ? FAX [phone] Web site: [email or web address]

## 2020-08-03 NOTE — Progress Notes (Signed)
Progress Note  3 Days Post-Op  Subjective: CC: had an episode of coughing yesterday with some regurgitation of food. No nausea, emesis, or abdominal pain with soft diet and has had breakfast. Having Bms. Abdominal pain well controlled   Objective: Vital signs in last 24 hours: Temp:  [97.3 F (36.3 C)-98.7 F (37.1 C)] 97.7 F (36.5 C) (07/01 0741) Pulse Rate:  [90-94] 90 (07/01 0741) Resp:  [17-20] 17 (07/01 0741) BP: (148-170)/(70-81) 170/81 (07/01 0741) SpO2:  [95 %-99 %] 98 % (07/01 0847) FiO2 (%):  [28 %] 28 % (07/01 0515) Last BM Date: 08/02/20  Intake/Output from previous day: 06/30 0701 - 07/01 0700 In: 200 [P.O.:200] Out: 500 [Urine:300; Stool:200] Intake/Output this shift: No intake/output data recorded.  PE: General: pleasant, WD, female who is sitting on bedside commode. NAD HEENT: head is normocephalic, atraumatic.  Mouth is pink and moist Heart:Palpable radial pulses bilaterally Lungs:  Respiratory effort nonlabored on supplemental O2 via [place] Abd: soft, NT, ND, +BS, incisions inact with steristrips in place. No erythema, discharge MS: all 4 extremities are symmetrical with no cyanosis, clubbing, or edema. Skin: warm and dry with no masses, lesions, or rashes Psych: A&Ox3 with an appropriate affect.    Lab Results:  Recent Labs    08/02/20 0148 08/03/20 0058  WBC 9.9 8.9  HGB 8.6* 9.2*  HCT 27.8* 29.2*  PLT 211 241   BMET Recent Labs    08/01/20 0145 08/02/20 0148  NA 136 136  K 3.5 3.8  CL 104 104  CO2 26 26  GLUCOSE 102* 90  BUN <5* 8  CREATININE 0.71 0.73  CALCIUM 7.5* 7.5*   PT/INR No results for input(s): LABPROT, INR in the last 72 hours. CMP     Component Value Date/Time   NA 136 08/02/2020 0148   NA 139 12/24/2018 1214   K 3.8 08/02/2020 0148   CL 104 08/02/2020 0148   CO2 26 08/02/2020 0148   GLUCOSE 90 08/02/2020 0148   BUN 8 08/02/2020 0148   BUN 18 12/24/2018 1214   CREATININE 0.73 08/02/2020 0148   CALCIUM 7.5  (L) 08/02/2020 0148   PROT 5.8 (L) 07/25/2020 1018   ALBUMIN 2.9 (L) 07/25/2020 1018   AST 29 07/25/2020 1018   ALT 21 07/25/2020 1018   ALKPHOS 77 07/25/2020 1018   BILITOT 0.4 07/25/2020 1018   GFRNONAA >60 08/02/2020 0148   GFRAA 75 12/24/2018 1214   Lipase     Component Value Date/Time   LIPASE 85 (H) 07/25/2020 1823       Studies/Results: No results found.  Anti-infectives: Anti-infectives (From admission, onward)    Start     Dose/Rate Route Frequency Ordered Stop   08/02/20 1200  cefdinir (OMNICEF) capsule 300 mg        300 mg Oral Every 12 hours 08/02/20 1103 08/06/20 0959   07/30/20 1415  cefTRIAXone (ROCEPHIN) 2 g in sodium chloride 0.9 % 100 mL IVPB  Status:  Discontinued        2 g 200 mL/hr over 30 Minutes Intravenous Every 24 hours 07/30/20 1319 08/02/20 1103   07/26/20 1000  hydroxychloroquine (PLAQUENIL) tablet 200 mg        200 mg Oral 2 times daily 07/25/20 2036          Assessment/Plan  Ascending colon mass - s/p laparoscopic assisted right hemicolectomy 07/31/20 - Dr. Georgette Dover - Path pending. - CEA pending -afebrile, VSS. On 2 lpm O2 via [place] - eliquis restarted -  Anemia - 1 u PRBC; Hgb 9.2 - Abdominal binder - Mobilize - PT/OT - recommending SNF placement   Abnormal gallbladder - CT with enhancing nodule. [place] with hypoechogenicity (sludge vs mass) - Gallbladder appeared normal at surgery   FEN: soft, IVF per primary ID: cefdinir for pleural effusion VTE: SCDs, eliquis   Stable for discharge to SNF from surgical perspective  Per primary: Atrial fibrillation with history fo PE - on eliquis at baseline Iron deficiency anemia, symptomatic - ferrlecit Alcohol abuse Pulmonary mass Tobacco use - 40 pack year history    LOS: 8 days    Winferd Humphrey, [facility] 08/03/2020, 10:05 AM Please see Amion for pager number during day hours 7:00am-4:30pm

## 2020-08-03 NOTE — Progress Notes (Signed)
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  Consider touching base with Ortho regarding management.   Delora Fuel, MD 08/03/2020, 1:27 AM PGY-1, [facility] pager [phone]

## 2020-08-03 NOTE — Plan of Care (Signed)

## 2020-08-03 NOTE — Progress Notes (Signed)
Patient OOB for meals. Assessed for nausea before lunch meal delivered - patient denies. As she began to eat, she started to feel nauseous per  patient report. Small amount of undigested food vomitted on lunch tray. MD updated.

## 2020-08-04 DIAGNOSIS — R1115 Cyclical vomiting syndrome unrelated to migraine: Secondary | ICD-10-CM

## 2020-08-04 DIAGNOSIS — E876 Hypokalemia: Secondary | ICD-10-CM

## 2020-08-04 LAB — CBC
HCT: 28.9 % — ABNORMAL LOW (ref 36.0–46.0)
Hemoglobin: 9.3 g/dL — ABNORMAL LOW (ref 12.0–15.0)
MCH: 29.3 pg (ref 26.0–34.0)
MCHC: 32.2 g/dL (ref 30.0–36.0)
MCV: 91.2 fL (ref 80.0–100.0)
Platelets: 252 10*3/uL (ref 150–400)
RBC: 3.17 MIL/uL — ABNORMAL LOW (ref 3.87–5.11)
RDW: 18.4 % — ABNORMAL HIGH (ref 11.5–15.5)
WBC: 7.5 10*3/uL (ref 4.0–10.5)
nRBC: 0 % (ref 0.0–0.2)

## 2020-08-04 LAB — BASIC METABOLIC PANEL
Anion gap: 5 (ref 5–15)
Anion gap: 10 (ref 5–15)
BUN: 5 mg/dL — ABNORMAL LOW (ref 8–23)
BUN: 8 mg/dL (ref 8–23)
CO2: 30 mmol/L (ref 22–32)
CO2: 24 mmol/L (ref 22–32)
Calcium: 8.2 mg/dL — ABNORMAL LOW (ref 8.9–10.3)
Calcium: 8.4 mg/dL — ABNORMAL LOW (ref 8.9–10.3)
Chloride: 102 mmol/L (ref 98–111)
Chloride: 100 mmol/L (ref 98–111)
Creatinine, Ser: 0.56 mg/dL (ref 0.44–1.00)
Creatinine, Ser: 0.58 mg/dL (ref 0.44–1.00)
GFR, Estimated: >60 mL/min (ref >60)
GFR, Estimated: >60 mL/min (ref >60)
Glucose, Bld: 100 mg/dL — ABNORMAL HIGH (ref 70–99)
Glucose, Bld: 111 mg/dL — ABNORMAL HIGH (ref 70–99)
Potassium: 2.8 mmol/L — ABNORMAL LOW (ref 3.5–5.1)
Potassium: 5 mmol/L (ref 3.5–5.1)
Sodium: 137 mmol/L (ref 135–145)
Sodium: 134 mmol/L — ABNORMAL LOW (ref 135–145)

## 2020-08-04 LAB — HEPATIC FUNCTION PANEL
ALT: 17 U/L (ref 0–44)
AST: 18 U/L (ref 15–41)
Albumin: 2.1 g/dL — ABNORMAL LOW (ref 3.5–5.0)
Alkaline Phosphatase: 69 U/L (ref 38–126)
Bilirubin, Direct: 0.2 mg/dL (ref 0.0–0.2)
Indirect Bilirubin: 0.4 mg/dL (ref 0.3–0.9)
Total Bilirubin: 0.6 mg/dL (ref 0.3–1.2)
Total Protein: 4.6 g/dL — ABNORMAL LOW (ref 6.5–8.1)

## 2020-08-04 LAB — MAGNESIUM: Magnesium: 1.3 mg/dL — ABNORMAL LOW (ref 1.7–2.4)

## 2020-08-04 MED ORDER — POTASSIUM CHLORIDE CRYS ER 20 MEQ PO TBCR
40.0000 meq | EXTENDED_RELEASE_TABLET | Freq: Once | ORAL | Status: AC
  Administered 2020-08-04: 40 meq via ORAL
  Filled 2020-08-04: qty 2

## 2020-08-04 MED ORDER — POTASSIUM CHLORIDE CRYS ER 20 MEQ PO TBCR: 40.0000 meq | EXTENDED_RELEASE_TABLET | Freq: Once | ORAL | Status: DC

## 2020-08-04 MED ORDER — POTASSIUM CHLORIDE 10 MEQ/100ML IV SOLN: 10.0000 meq | Freq: Once | INTRAVENOUS | Status: DC

## 2020-08-04 MED ORDER — MAGNESIUM SULFATE 2 GM/50ML IV SOLN
2.0000 g | Freq: Once | INTRAVENOUS | Status: AC
  Administered 2020-08-04: 2 g via INTRAVENOUS
  Filled 2020-08-04: qty 50

## 2020-08-04 MED ORDER — POTASSIUM CHLORIDE 10 MEQ/100ML IV SOLN
10.0000 meq | INTRAVENOUS | Status: DC
  Administered 2020-08-04: 10 meq via INTRAVENOUS
  Filled 2020-08-04 (×2): qty 100

## 2020-08-04 MED ORDER — ONDANSETRON 4 MG PO TBDP: 4.0000 mg | ORAL_TABLET | Freq: Three times a day (TID) | ORAL | Status: DC | PRN

## 2020-08-04 NOTE — Plan of Care (Signed)
  Problem: Education: Goal: Knowledge of General Education information will improve Description: Including pain rating scale, medication(s)/side effects and non-pharmacologic comfort measures Outcome: Progressing   Problem: Clinical Measurements: Goal: Ability to maintain clinical measurements within normal limits will improve Outcome: Progressing Goal: Will remain free from infection Outcome: Progressing Goal: Diagnostic test results will improve Outcome: Progressing Goal: Respiratory complications will improve Outcome: Progressing Goal: Cardiovascular complication will be avoided Outcome: Progressing   Problem: Health Behavior/Discharge Planning: Goal: Ability to manage health-related needs will improve Outcome: Progressing   Problem: Activity: Goal: Risk for activity intolerance will decrease Outcome: Progressing   Problem: Nutrition: Goal: Adequate nutrition will be maintained Outcome: Progressing   Problem: Elimination: Goal: Will not experience complications related to bowel motility Outcome: Progressing Goal: Will not experience complications related to urinary retention Outcome: Progressing   Problem: Pain Managment: Goal: General experience of comfort will improve Outcome: Progressing   Problem: Safety: Goal: Ability to remain free from injury will improve Outcome: Progressing

## 2020-08-04 NOTE — Progress Notes (Signed)
Received page from nurse.  Patient was not tolerating the IV potassium. Nurse stated the patient did well with the morning oral potassium. Placed order to switch to oral potassium. Did modify the IV order to complete the one dose over a longer period of time since it had been started. If patient is unable to tolerate this we will discontinue this one IV potassium dose.  Precious Gilding, DO

## 2020-08-04 NOTE — Progress Notes (Addendum)
FMTS Attending Daily Note: [name], MD  Team Pager [phone] Pager [phone]  I have seen and examined this patient, reviewed their chart. I have discussed this patient with the resident physician.  Addendums to below note include:  Nausea and vomiting, improved with bland diet, will see if she can tolerate PO off of Zofran. KUB unremarkable. Abdominal exam benign. EKG without acute changes.  History of atrial fibrillation, NSR with PVC on EKG. Qtc acceptable.   Hypokalemia, aggressive repletion given postop status, possible ileus, cardiac history. Repeat in PM with Magnesium.   I agree with the remainder of the findings, exam, and plan below.   Disposition: SNF 7/3 if potassium stable.    Family Medicine Teaching Service Daily Progress Note Intern Pager: [phone]  Patient name: Annaliz [name] Medical record number: [id number] Date of birth: 03-Jun-1945 Age: 75 y.o. Gender: female  Primary Care Provider: Raina Mina., MD Consultants: General surgery, GI, Pulmonary Code Status: DNR  Pt Overview and Major Events to Date:  6/22-admitted 6/25-thoracentesis 6/28-right hemicolectomy 6/29-transfused 1 unit pRBCs  Assessment and Plan: [name] is a 75 yo female who presented with anemia due to blood loss and was found to have a mass in her ascending colon. She had a right hemicolectomy and is on POD #4. PMH is significant for cirrhosis, a fib, RA, COPD on 2 L O2 at baseline, CKD stage III.  Ascending Colon Mass and anemia s/p right hemicolectomy, Post  op vomiting POD#4 s/p right hemicolectomy. Suspect colorectal cancer. Symptomatic anemia has improved. Hgb this morning 9.3. VSS. Pt vomited twice after eating on 7/1.  KUB showed no acute abnormality. She was given zofran before dinner and was able to keep her dinner down. Potassium was 2.8 at 0125. Oral potassium 40 meq ordered for this morning and this afternoon. Albumin 2.1, consider malnutrition.  -Gen surg  following -Tylenol 650 mg q6h prn -Oxycodone 5 mg q6h prn -Abdominal binder -PT/OT -Out patient oncology f/u -Zofran  4 mg before meals -Recheck BMP this afternoon after potassium repletion -Continue feeding supplement between meals -Order magnesium today -40 meq potassium PO this morning and afternoon and 40 meq 40 IV  Pleural Effusion s/p thoracentesis Pleural fluid grew staph epidermidis. -Continue cefdinir 300 mg q12hr until 7/3 to complete a 7 day course  Paroxysmal a fib History of pulmonary embolism. Remains in NSR. Optimal dose of eliquis is 5 mg BID.  PCP can follow to increase dose depending on anemia and cancer treatment. -Continue home eliquis 2.5 mg BID -Continue home carvedilol 3.125 mg BID   HTN  BP today is elevated at 154/80. -continue amlodipine 5 mg daily -irbesartan 600 mg daily  COPD  Chronic and stable -Albuterol prn -Brezeti -Continue home O2, 2L  Rheumatoid arthritis (HCC) Chronic, stable -Continue home prednisone 5mg  daily, plaquenil 200 mg twice daily  Shoulder dislocation Pt injured shoulder about a year ago.  Will follow up with ortho out patient.  FEN/GI: Soft diet PPx: Eliquis Dispo:SNF   Subjective:  Pt is feeling tired this morning.  She states she slept well last night and kept her dinner down.  She was eating chicken soup this morning and does not feel nauseous.  She had zofran before eating. She denies abdominal pain.   Objective: Temp:  [97.7 F (36.5 C)-97.9 F (36.6 C)] 97.7 F (36.5 C) (07/02 0733) Pulse Rate:  [80-95] 95 (07/02 0733) Resp:  [16-18] 18 (07/02 0733) BP: (136-160)/(60-86) 154/80 (07/02 0733) SpO2:  [93 %-99 %] 97 % (  07/02 0740) Weight:  [65.4 kg] 65.4 kg (07/02 0347) Physical Exam: General: Alert, NAD, resting comfortably in chair Cardiovascular: RRR, normal S1/S2 Respiratory: CTA bilaterally  Abdomen: soft, non distended, non tender to palpation. Sutures with minimal dried blood, no drainage, no  erythema, no swelling, steri strips in place.   Laboratory: Recent Labs  Lab 08/02/20 0148 08/03/20 0058 08/04/20 0125  WBC 9.9 8.9 7.5  HGB 8.6* 9.2* 9.3*  HCT 27.8* 29.2* 28.9*  PLT 211 241 252   Recent Labs  Lab 08/01/20 0145 08/02/20 0148 08/04/20 0125  NA 136 136 137  K 3.5 3.8 2.8*  CL 104 104 102  CO2 26 26 30   BUN <5* 8 5*  CREATININE 0.71 0.73 0.56  CALCIUM 7.5* 7.5* 8.2*  PROT  --   --  4.6*  BILITOT  --   --  0.6  ALKPHOS  --   --  69  ALT  --   --  17  AST  --   --  18  GLUCOSE 102* 90 100Precious Gilding, DO 08/04/2020, 8:32 AM PGY-1, [place] Intern pager: [phone], text pages welcome

## 2020-08-04 NOTE — Progress Notes (Signed)
4 Days Post-Op   Subjective/Chief Complaint: No complaints No new changes Pt to go to SNF   Objective: Vital signs in last 24 hours: Temp:  [97.7 F (36.5 C)-97.9 F (36.6 C)] 97.7 F (36.5 C) (07/02 0733) Pulse Rate:  [80-95] 95 (07/02 0733) Resp:  [16-18] 18 (07/02 0733) BP: (136-160)/(60-86) 154/80 (07/02 0733) SpO2:  [93 %-99 %] 97 % (07/02 0740) Weight:  [65.4 kg] 65.4 kg (07/02 0347) Last BM Date: 08/02/20  Intake/Output from previous day: No intake/output data recorded. Intake/Output this shift: No intake/output data recorded.  Exam: Up in a chair eating, awake and alert Abdomen soft  Lab Results:  Recent Labs    08/03/20 0058 08/04/20 0125  WBC 8.9 7.5  HGB 9.2* 9.3*  HCT 29.2* 28.9*  PLT 241 252   BMET Recent Labs    08/02/20 0148 08/04/20 0125  NA 136 137  K 3.8 2.8*  CL 104 102  CO2 26 30  GLUCOSE 90 100*  BUN 8 5*  CREATININE 0.73 0.56  CALCIUM 7.5* 8.2*   PT/INR No results for input(s): LABPROT, INR in the last 72 hours. ABG No results for input(s): PHART, HCO3 in the last 72 hours.  Invalid input(s): PCO2, PO2  Studies/Results: DG Abd 1 View  Result Date: 08/03/2020 CLINICAL DATA:  Nausea and vomiting EXAM: ABDOMEN - 1 VIEW COMPARISON:  07/28/2020 CT FINDINGS: Scattered large and small bowel gas is noted. No obstructive changes or free air is identified. No abnormal mass or abnormal calcifications are seen. Bony structures demonstrate degenerative change of the lumbar spine. IMPRESSION: No acute abnormality noted. Electronically Signed   By: Inez Catalina M.D.   On: 08/03/2020 16:13    Anti-infectives: Anti-infectives (From admission, onward)    Start     Dose/Rate Route Frequency Ordered Stop   08/03/20 0000  cefdinir (OMNICEF) 300 MG capsule        300 mg Oral Every 12 hours 08/03/20 1105 08/05/20 2359   08/02/20 1200  cefdinir (OMNICEF) capsule 300 mg        300 mg Oral Every 12 hours 08/02/20 1103 08/06/20 0959   07/30/20 1415   cefTRIAXone (ROCEPHIN) 2 g in sodium chloride 0.9 % 100 mL IVPB  Status:  Discontinued        2 g 200 mL/hr over 30 Minutes Intravenous Every 24 hours 07/30/20 1319 08/02/20 1103   07/26/20 1000  hydroxychloroquine (PLAQUENIL) tablet 200 mg        200 mg Oral 2 times daily 07/25/20 2036         Assessment/Plan: s/p Procedure(s): LAPAROSCOPIC ASSISSTED  RIGHT HEMI COLECTOMY (Right)  Ok for SNF from a surgical standpoint Dr. Georgette Dover to follow up in our office as an outpt. Will see here PRN  LOS: 9 days    Coralie Keens 08/04/2020

## 2020-08-04 NOTE — Progress Notes (Signed)
FPTS Brief Progress Note  S: Doing well. No complaints.   O: BP 132/63 (BP Location: Left Arm)   Pulse 87   Temp 98.5 F (36.9 C) (Oral)   Resp 16   Ht 5\' 1"  (1.549 m)   Wt 65.4 kg   SpO2 97%   BMI 27.24 kg/m   General: Appears well, no acute distress. Age appropriate. Cardiac: RRR, normal heart sounds, no murmurs Respiratory: CTAB, normal effort Abdomen: soft, nontender, nondistended Extremities: No edema or cyanosis. 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 Labs for AM ordered, which was adjusted as needed.  - Will continue to monitor throughout the night and will see in the morning for post call rounding.  - Likely discharge 7/3  Gerlene Fee, DO 08/04/2020, 9:27 PM PGY-3, [facility] Night Resident  Please page [phone] with questions.

## 2020-08-04 NOTE — Progress Notes (Signed)
FPTS Brief Progress Note  S: Reported to bedside for evening evaluation.  Patient's RN states that the patient has been able to tolerate meals including her dinner during this shift.  Patient has had no further episodes of emesis.  Patient has had no new complaints, no new nursing concerns at this time.   O: BP 136/60 (BP Location: Left Arm)   Pulse 83   Temp 97.8 F (36.6 C) (Oral)   Resp 16   Ht 5\' 1"  (1.549 m)   Wt 60.5 kg   SpO2 96%   BMI 25.20 kg/m   General: Patient is pale elderly female sleeping comfortably in bed does not appear to be in any acute distress, normal respiratory effort  A/P: Patient is a 75 year old female who presented with symptomatic anemia in the setting of GI bleed now status post right hemicolectomy.  Patient is stable and expected to discharge to skilled nursing facility on 08/05/2020. Patient has had negative COVID test in anticipation of her discharge to a skilled nursing facility  - Orders reviewed. Labs for AM not ordered, which was adjusted as needed.  - If condition changes, plan includes reassessment for cause of any onset of episodes of emesis.   Eulis Foster, MD 08/04/2020, 2:49 AM PGY-3, [facility] Night Resident  Please page [phone] with questions.

## 2020-08-05 DIAGNOSIS — K824 Cholesterolosis of gallbladder: Secondary | ICD-10-CM

## 2020-08-05 LAB — BASIC METABOLIC PANEL
Anion gap: 7 (ref 5–15)
BUN: 7 mg/dL — ABNORMAL LOW (ref 8–23)
CO2: 27 mmol/L (ref 22–32)
Calcium: 8.2 mg/dL — ABNORMAL LOW (ref 8.9–10.3)
Chloride: 98 mmol/L (ref 98–111)
Creatinine, Ser: 0.56 mg/dL (ref 0.44–1.00)
GFR, Estimated: >60 mL/min (ref >60)
Glucose, Bld: 88 mg/dL (ref 70–99)
Potassium: 3.8 mmol/L (ref 3.5–5.1)
Sodium: 132 mmol/L — ABNORMAL LOW (ref 135–145)

## 2020-08-05 LAB — MAGNESIUM: Magnesium: 1.6 mg/dL — ABNORMAL LOW (ref 1.7–2.4)

## 2020-08-05 LAB — CBC
HCT: 28.6 % — ABNORMAL LOW (ref 36.0–46.0)
Hemoglobin: 9.1 g/dL — ABNORMAL LOW (ref 12.0–15.0)
MCH: 29.2 pg (ref 26.0–34.0)
MCHC: 31.8 g/dL (ref 30.0–36.0)
MCV: 91.7 fL (ref 80.0–100.0)
Platelets: 275 10*3/uL (ref 150–400)
RBC: 3.12 MIL/uL — ABNORMAL LOW (ref 3.87–5.11)
RDW: 18.4 % — ABNORMAL HIGH (ref 11.5–15.5)
WBC: 9.2 10*3/uL (ref 4.0–10.5)
nRBC: 0 % (ref 0.0–0.2)

## 2020-08-05 LAB — SARS CORONAVIRUS 2 (TAT 6-24 HRS): SARS Coronavirus 2: NEGATIVE

## 2020-08-05 MED ORDER — ONDANSETRON 4 MG PO TBDP: 4.0000 mg | ORAL_TABLET | Freq: Three times a day (TID) | ORAL | 0 refills | Status: DC | PRN

## 2020-08-05 MED ORDER — MAGNESIUM SULFATE 2 GM/50ML IV SOLN
2.0000 g | Freq: Once | INTRAVENOUS | Status: AC
  Administered 2020-08-05: 2 g via INTRAVENOUS
  Filled 2020-08-05: qty 50

## 2020-08-05 MED ORDER — POTASSIUM CHLORIDE CRYS ER 20 MEQ PO TBCR
20.0000 meq | EXTENDED_RELEASE_TABLET | Freq: Once | ORAL | Status: AC
  Administered 2020-08-05: 20 meq via ORAL
  Filled 2020-08-05: qty 1

## 2020-08-05 MED ORDER — CEFDINIR 300 MG PO CAPS: 300.0000 mg | ORAL_CAPSULE | Freq: Two times a day (BID) | ORAL | 0 refills | Status: AC

## 2020-08-05 NOTE — TOC Transition Note (Addendum)
Transition of Care [facility]) - CM/SW Discharge Note   Patient Details  Name: Desiree [name] MRN: [id number] Date of Birth: Jan 18, 1946  Transition of Care [facility]) CM/SW Contact:  Gabrielle Dare Phone Number: 08/05/2020, 12:15 PM   Clinical Narrative:    Patient will Discharge To: [facility] Anticipated DC Date:08/05/20 Family Notified:yes, daughter [name] [phone] Transport MA:UQJF   Per MD patient ready for DC to [facility]. RN, patient, patient's family, and facility notified of DC. Assessment, Fl2/Pasrr, and Discharge Summary sent to facility. RN given number for report [phone] ask for Ext [phone]). DC packet on chart. Ambulance transport requested for patient for 2:00pm  CSW signing off.  Reed Breech [facility] [phone]     Final next level of care: Skilled Nursing Facility Barriers to Discharge: No Barriers Identified   Patient Goals and CMS Choice Patient states their goals for this hospitalization and ongoing recovery are:: rehab CMS Medicare.gov Compare Post Acute Care list provided to:: Patient Choice offered to / list presented to : Patient  Discharge Placement              Patient chooses bed at: [facility] [place] Patient to be transferred to facility by: [facility] Name of family member notified: [name] Patient and family notified of of transfer: 08/05/20  Discharge Plan and Services In-house Referral: Clinical Social Work   Post Acute Care Choice: [facility]                               Social Determinants of Health (SDOH) Interventions     Readmission Risk Interventions No flowsheet data found.

## 2020-08-05 NOTE — Progress Notes (Signed)
Called report to [employer] at [facility]. No questions at this time.

## 2020-08-08 LAB — SURGICAL PATHOLOGY

## 2020-08-13 ENCOUNTER — Other Ambulatory Visit: Payer: Self-pay

## 2020-08-13 MED ORDER — CARVEDILOL 3.125 MG PO TABS: 3.1250 mg | ORAL_TABLET | Freq: Two times a day (BID) | ORAL | 0 refills | Status: DC

## 2020-08-13 NOTE — Telephone Encounter (Signed)
Refill of Carvedilol 3.125 mg sent to [facility].

## 2020-08-14 ENCOUNTER — Telehealth: Payer: Self-pay | Admitting: Oncology

## 2020-08-14 NOTE — Telephone Encounter (Signed)
Patient referred by Dr Donnie Mesa for Colon CA.  Appt made for 08/23/20 Consult at 11:00 am

## 2020-08-16 ENCOUNTER — Encounter: Payer: Self-pay | Admitting: Pulmonary Disease

## 2020-08-16 ENCOUNTER — Inpatient Hospital Stay: Payer: Medicare PPO | Admitting: Pulmonary Disease

## 2020-08-16 ENCOUNTER — Telehealth: Payer: Self-pay | Admitting: Pulmonary Disease

## 2020-08-16 ENCOUNTER — Other Ambulatory Visit: Payer: Self-pay

## 2020-08-16 ENCOUNTER — Ambulatory Visit: Payer: Medicare PPO | Admitting: Pulmonary Disease

## 2020-08-16 VITALS — BP 132/60 | HR 93 | Ht 61.0 in | Wt 117.4 lb

## 2020-08-16 DIAGNOSIS — J479 Bronchiectasis, uncomplicated: Secondary | ICD-10-CM | POA: Diagnosis not present

## 2020-08-16 DIAGNOSIS — J449 Chronic obstructive pulmonary disease, unspecified: Secondary | ICD-10-CM | POA: Diagnosis not present

## 2020-08-16 DIAGNOSIS — R911 Solitary pulmonary nodule: Secondary | ICD-10-CM

## 2020-08-16 DIAGNOSIS — J9611 Chronic respiratory failure with hypoxia: Secondary | ICD-10-CM | POA: Diagnosis not present

## 2020-08-16 HISTORY — DX: Solitary pulmonary nodule: R91.1

## 2020-08-16 NOTE — Telephone Encounter (Signed)
ENB has been scheduled for 8/9 at 9:30,  Pt will have CT and PET on 8/2 at [facility].  Spoke to [employer] in [place] and she will have disc sent to [facility].  I sent Larene Beach a staff message and made her aware pt is coming from facility and per Dr Valeta Harms it is ok for her to have her covid test the morning of her procedure.  All appt info given to pt & her family.

## 2020-08-16 NOTE — Progress Notes (Signed)
Synopsis: Referred in September 2020 for establish care with new pulmonologist former patient of Dr. Lake Bells, PCP: Raina Mina., MD  Subjective:   PATIENT ID: Desiree Peon GENDER: female DOB: July 09, 1945, MRN: [id number]  Chief Complaint  Patient presents with   Hospitalization Follow-up    Fluids removed from lungs at hospital, breathing treatments seem to helped and inhaler's working well.     75 year old female past medical history of COPD, pulmonary embolism, rheumatoid arthritis, 5 mg prednisone plus Plaquenil, former patient of Dr. Lake Bells.  COPD currently managed with Trelegy.  Has had several images following lung nodules since 2017.  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  PET scan July 2019 showed FDG activity SUV 1.348 mm left lower lobe nodule.  IgE 158, Rast panel positive for cat dander, dog dander and Aspergillus fumigate [place].  Last evaluation included sputum cultures, fungus AFB.  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  Appears patient had exacerbation in February and was treated with Levaquin.  As for her RA she is currently on Plaquenil.  Overall she has seen a significant decline in her physical ability.  She states that she is really noticed a significant increase in her dyspnea over the past several months.  Otherwise her respiratory complaints related to her COPD have been stable.  She uses her Trelegy inhaler regularly.  She occasionally describes a central "abnormal feeling in her chest with exertion but most of the time this all dissipates with resting.  She states even the time yes tasks make her severely fatigued as come paired to previous.  Patient denies fevers chills weight loss hemoptysis.  OV 11/08/2018: (Via telephone visit).  Patient scheduled for telephone visit to review CT imaging.  Patient found to have a 2 x 1.3 cm nodule abutting the  minor fissure discussed via telephone today.  OV 11/23/2018: Here today to review CT imaging in person.  Patient had pulmonary function test completed today prior to today's office visit.  PFTs revealed a ratio of 62, FEV1 51% predicted..  Also to review her CT imaging.  Patient very anxious about all of this that is going on.  She recently changed from Trelegy to Spiriva plus Symbicort.  She is doing well with her new inhaler regimen.  She feels less dyspneic.  Patient denies fevers chills night sweats weight loss, cough or sputum production.  OV 08/16/2020: Here today for follow-up after recent hospitalization.  She has been followed throughout our clinic since 2020 via telemetry visits.  Please reference those separate encounters for separate follow-up.  She has a lung nodule that we have been following for some time.  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  CT imaging of the chest was completed on presentationWhich revealed enlargement of the lung nodule that we have been following.  This bilobed left lower lobe nodule had showed slow progression in size.  Since we have been following this for some time I suspect we are dealing with a separate malignancy in comparison to her recent colon cancer diagnosis.  Patient was taken to the operating room by general surgery and ultimately discharged on 08/05/2020.  Here today for follow-up.  Recently saw general surgery in follow-up.  She has some drainage from  the incision site felt to be infected was started on antibiotics.  Respiratory standpoint she is stable.  On 2 L nasal cannula continuous.  Currently residing in an assisted living.  Has follow-up scheduled with medical oncology soon.    Past Medical History:  Diagnosis Date   AF (paroxysmal atrial fibrillation) (HCC)    Carcinoma of  ascending colon (HCC)    Cat allergies    COPD (chronic obstructive pulmonary disease) (HCC)    Hypertension    Osteoporosis    Pulmonary embolism ([place]) 2017   Rheumatoid arthritis(714.0)    Dr Tobie Lords follows     Family History  Problem Relation Age of Onset   Heart disease Father    Stroke Mother    Clotting disorder Mother    Heart disease Sister    Sleep apnea Sister    Emphysema Sister    Breast cancer Maternal Aunt    Emphysema Paternal Grandmother      Past Surgical History:  Procedure Laterality Date   BIOPSY  07/27/2020   Procedure: BIOPSY;  Surgeon: Gatha Mayer, MD;  Location: [facility] ENDOSCOPY;  Service: Endoscopy;;   COLONOSCOPY N/A 07/27/2020   Procedure: COLONOSCOPY;  Surgeon: Gatha Mayer, MD;  Location: [facility] ENDOSCOPY;  Service: Endoscopy;  Laterality: N/A;   CYSTECTOMY Right    breast   ESOPHAGOGASTRODUODENOSCOPY (EGD) WITH PROPOFOL N/A 07/27/2020   Procedure: ESOPHAGOGASTRODUODENOSCOPY (EGD) WITH PROPOFOL;  Surgeon: Gatha Mayer, MD;  Location: [facility];  Service: Endoscopy;  Laterality: N/A;   LAPAROSCOPIC RIGHT HEMI COLECTOMY Right 07/31/2020   Procedure: LAPAROSCOPIC ASSISSTED  RIGHT HEMI COLECTOMY;  Surgeon: Donnie Mesa, MD;  Location: [facility];  Service: General;  Laterality: Right;   LEFT HEART CATH AND CORONARY ANGIOGRAPHY N/A 01/05/2019   Procedure: LEFT HEART CATH AND CORONARY ANGIOGRAPHY;  Surgeon: Jettie Booze, MD;  Location: [facility];  Service: Cardiovascular;  Laterality: N/A;   SUBMUCOSAL TATTOO INJECTION  07/27/2020   Procedure: SUBMUCOSAL TATTOO INJECTION;  Surgeon: Gatha Mayer, MD;  Location: [facility] ENDOSCOPY;  Service: Endoscopy;;    Social History   Socioeconomic History   Marital status: Widowed    Spouse name: Not on file   Number of children: Not on file   Years of education: Not on file   Highest education level: Not on file  Occupational History   Occupation: retired  Tobacco Use   Smoking status:  Former    Packs/day: 1.00    Years: 40.00    Pack years: 40.00    Types: Cigarettes    Quit date: 02/03/2001    Years since quitting: 19.5   Smokeless tobacco: Never  Vaping Use   Vaping Use: Never used  Substance and Sexual Activity   Alcohol use: Yes    Alcohol/week: 2.0 standard drinks    Types: 2 Standard drinks or equivalent per week    Comment: 2 drinks daily   Drug use: No   Sexual activity: Not on file  Other Topics Concern   Not on file  Social History Narrative   Tobacco use, amount per day now: NONE   Past tobacco use, amount per day: 1 PACK PER DAY   How many years did you use tobacco:TOO MANY STOPPED IN 2003   Alcohol use (drinks per week): 14   Diet: GOOD   Do you drink/eat things with caffeine: YES   Marital status: WIDOW  What year were you married? 1967   Do you live in a house, apartment, assisted living, condo, trailer, etc.? HOUSE   Is it one or more stories? SPLIT LEVEL   How many persons live in your home? 1   Do you have pets in your home?( please list) NONE   Current or past profession: [employer]   Do you exercise?  NO                                Type and how often?   Do you have a living will? YES    Do you have a DNR form?  YES                                 If not, do you want to discuss one?   Do you have signed POA/HPOA forms?        ??              If so, please bring to you appointment      [facility] (05/16/16):   Originally from [place], [place]. 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 No pets currently. No bird, mold, or hot tub exposure.    Social Determinants of Health   Financial Resource Strain: Not on file  Food Insecurity: Not on file  Transportation Needs: Not on file  Physical Activity: Not on file  Stress: Not on file  Social Connections: Not on file  Intimate Partner Violence: Not on file     Allergies  Allergen Reactions   Other  Shortness Of Breath    Cats     Outpatient Medications Prior to Visit  Medication Sig Dispense Refill   acetaminophen (TYLENOL) 500 MG tablet Take 500 mg by mouth every 6 (six) hours as needed for moderate pain or headache.     albuterol (VENTOLIN HFA) 108 (90 Base) MCG/ACT inhaler Inhale 2 puffs into the lungs every 4 (four) hours as needed for wheezing or shortness of breath.     amLODipine (NORVASC) 5 MG tablet Take 1 tablet (5 mg total) by mouth daily. 90 tablet 1   apixaban (ELIQUIS) 2.5 MG TABS tablet Take 1 tablet (2.5 mg total) by mouth 2 (two) times daily. 60 tablet 5   atorvastatin (LIPITOR) 20 MG tablet Take 1 tablet (20 mg total) by mouth daily. 30 tablet 1   Budeson-Glycopyrrol-Formoterol (BREZTRI AEROSPHERE) 160-9-4.8 MCG/ACT AERO Inhale 2 puffs into the lungs 2 (two) times daily. 10.7 g 4   carvedilol (COREG) 3.125 MG tablet Take 1 tablet (3.125 mg total) by mouth 2 (two) times daily with a meal. 60 tablet 0   cholecalciferol (VITAMIN D) 25 MCG (1000 UT) tablet Take 1,000 Units by mouth daily.      ferrous sulfate 325 (65 FE) MG EC tablet Take 325 mg by mouth daily with breakfast.     fexofenadine (ALLEGRA) 180 MG tablet Take 180 mg by mouth daily.     hydroxychloroquine (PLAQUENIL) 200 MG tablet Take 200 mg by mouth 2 (two) times daily.   3   leflunomide (ARAVA) 20 MG tablet Take 20 mg by mouth daily.     montelukast (SINGULAIR) 10 MG tablet Take 1 tablet (10 mg total) by mouth at bedtime. 90 tablet 1   omeprazole (PRILOSEC) 20 MG capsule  Take 1 capsule (20 mg total) by mouth daily. 90 capsule 1   ondansetron (ZOFRAN-ODT) 4 MG disintegrating tablet Take 1 tablet (4 mg total) by mouth every 8 (eight) hours as needed for nausea or vomiting. 10 tablet 0   predniSONE (DELTASONE) 5 MG tablet Take 5 mg by mouth daily with breakfast.     sertraline (ZOLOFT) 100 MG tablet Take 100 mg by mouth daily.      valsartan (DIOVAN) 320 MG tablet Take 320 mg by mouth daily.     vitamin B-12  (CYANOCOBALAMIN) 1000 MCG tablet Take 1,000 mcg by mouth daily.      No facility-administered medications prior to visit.    Review of Systems  Constitutional:  Positive for malaise/fatigue. Negative for chills, fever and weight loss.  HENT:  Negative for hearing loss, sore throat and tinnitus.   Eyes:  Negative for blurred vision and double vision.  Respiratory:  Positive for shortness of breath. Negative for cough, hemoptysis, sputum production, wheezing and stridor.   Cardiovascular:  Negative for chest pain, palpitations, orthopnea, leg swelling and PND.  Gastrointestinal:  Positive for abdominal pain. Negative for constipation, diarrhea, heartburn, nausea and vomiting.  Genitourinary:  Negative for dysuria, hematuria and urgency.  Musculoskeletal:  Negative for joint pain and myalgias.  Skin:  Negative for itching and rash.  Neurological:  Negative for dizziness, tingling, weakness and headaches.  Endo/Heme/Allergies:  Negative for environmental allergies. Does not bruise/bleed easily.  Psychiatric/Behavioral:  Negative for depression. The patient is not nervous/anxious and does not have insomnia.   All other systems reviewed and are negative.   Objective:  Physical Exam Vitals reviewed.  Constitutional:      General: She is not in acute distress.    Appearance: She is well-developed.  HENT:     Head: Normocephalic and atraumatic.  Eyes:     General: No scleral icterus.    Conjunctiva/sclera: Conjunctivae normal.     Pupils: Pupils are equal, round, and reactive to light.  Neck:     Vascular: No JVD.     Trachea: No tracheal deviation.  Cardiovascular:     Rate and Rhythm: Normal rate and regular rhythm.     Heart sounds: Normal heart sounds. No murmur heard. Pulmonary:     Effort: Pulmonary effort is normal. No tachypnea, accessory muscle usage or respiratory distress.     Breath sounds: No stridor. No wheezing, rhonchi or rales.     Comments: Diminished breath sounds  bilaterally Abdominal:     General: Bowel sounds are normal. There is no distension.     Tenderness: There is no abdominal tenderness.     Comments: Dressing, central abdomen, did not take this down.  Recent looked at by surgery on Monday.  Does appear to have some drainage.  Musculoskeletal:        General: No tenderness.     Cervical back: Neck supple.  Lymphadenopathy:     Cervical: No cervical adenopathy.  Skin:    General: Skin is warm and dry.     Capillary Refill: Capillary refill takes less than 2 seconds.     Findings: No rash.  Neurological:     Mental Status: She is alert and oriented to person, place, and time.  Psychiatric:        Behavior: Behavior normal.     Vitals:   08/16/20 1419  BP: 132/60  Pulse: 93  SpO2: 95%  Weight: 117 lb 6.4 oz (53.3 kg)  Height: _0  (1.549  m)   95% on RA BMI Readings from Last 3 Encounters:  08/16/20 22.18 kg/m  08/05/20 27.37 kg/m  05/25/19 25.51 kg/m   Wt Readings from Last 3 Encounters:  08/16/20 117 lb 6.4 oz (53.3 kg)  08/05/20 144 lb 13.5 oz (65.7 kg)  05/25/19 135 lb (61.2 kg)     CBC    Component Value Date/Time   WBC 9.2 08/05/2020 0114   RBC 3.12 (L) 08/05/2020 0114   HGB 9.1 (L) 08/05/2020 0114   HGB 11.1 09/30/2019 1151   HCT 28.6 (L) 08/05/2020 0114   HCT 33.4 (L) 09/30/2019 1151   PLT 275 08/05/2020 0114   PLT 333 09/30/2019 1151   MCV 91.7 08/05/2020 0114   MCV 99 (H) 09/30/2019 1151   MCH 29.2 08/05/2020 0114   MCHC 31.8 08/05/2020 0114   RDW 18.4 (H) 08/05/2020 0114   RDW 12.3 09/30/2019 1151   LYMPHSABS 0.4 (L) 07/16/2020 1605   LYMPHSABS 0.6 (L) 10/03/2015 1512   MONOABS 1.0 07/16/2020 1605   EOSABS 0.0 07/16/2020 1605   EOSABS 0.2 10/03/2015 1512   BASOSABS 0.1 07/16/2020 1605   BASOSABS 0.0 10/03/2015 1512    Chest Imaging: CT chest 03/03/2017: Evidence of infectious bronchiolitis, 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   Moderate emphysema. The patient's images have been independently reviewed by me.    CT chest September 2020: Reviewed with patient today in the office.  Does have 2 cm right upper lobe nodule with area of inflammatory appearing groundglass around this. Also has evidence of MAI and cylindric bronchiectasis. The patient's images have been independently reviewed by me.    CT chest 07/28/2020: Small pleural effusion on the right side, slowly enlarging left lower lobe pulmonary nodule concerning for a primary bronchogenic carcinoma.  Has increased in size comparison to previous imaging. The patient's images have been independently reviewed by me.    Pulmonary Functions Testing Results: PFT Results Latest Ref Rng & Units 11/23/2018 09/05/2016  FVC-Pre L 1.46 1.71  FVC-Predicted Pre % 59 67  FVC-Post L 1.52 1.75  FVC-Predicted Post % 61 69  Pre FEV1/FVC % % 63 66  Post FEV1/FCV % % 62 69  FEV1-Pre L 0.91 1.13  FEV1-Predicted Pre % 49 59  FEV1-Post L 0.94 1.21  DLCO uncorrected ml/min/mmHg 9.72 13.43  DLCO UNC% % 56 66  DLCO corrected ml/min/mmHg - 14.03  DLCO COR %Predicted % - 69  DLVA Predicted % 72 86  TLC L 4.17 4.77  TLC % Predicted % 90 103  RV % Predicted % 113 128    FeNO: None   Pathology: None  07/31/2020: SURGICAL PATHOLOGY  THIS IS AN ADDENDUM REPORT CASE: [id number]  PATIENT: Desiree [name]  Surgical Pathology Report  ddendum   Reason for Addendum #1:  DNA Mismatch Repair IHC Results   Clinical History: right colon mass (cm)  FINAL MICROSCOPIC DIAGNOSIS:   A. COLON, RIGHT AND TERMINAL ILEUM, HEMI-COLECTOMY:  -  Adenocarcinoma, poorly differentiated, 3.4 cm  -  Metastatic carcinoma involving one of sixteen lymph nodes (1/16)  -  Margins uninvolved by carcinoma  -  Multiple tubular adenomas (0.8 cm; largest)  -  Benign appendix  -  See oncology table and comment below   Echocardiogram: None   Heart Catheterization: None     Assessment & Plan:      ICD-10-CM   1. Lung nodule, solitary  R91.1     2. Stage 2 moderate COPD by  GOLD classification ([place])  J44.9     3. Lung nodule  R91.1     4. Bronchiectasis without complication ([place])  P32.9     5. Chronic respiratory failure with hypoxia (HCC)  J96.11       Discussion:  This is a 75 year old female, moderate COPD, FEV1 of 51% predicted.  Baseline chronic hypoxemic respiratory failure on 2 L nasal cannula.  Patient recently underwent surgery for a right-sided colon adenocarcinoma with resection.  1 node within the dissection was positive for metastasis.  Patient has follow-up scheduled with medical oncology at this time to discuss next steps.  She has been recovering well from this surgery.  Of note was found to have a nodule that is slowly enlarging.  We have been following this for some time in the outpatient setting.  Plan: We discussed today the risk benefits and alternatives of proceeding with tissue biopsy. Patient is understandable of the risk to include bleeding and pneumothorax. We will plan for a robotic assisted bronchoscopy with tissue sampling of the left lower lobe nodule. Likely plan for fiducial placement. I do not think she be a good candidate for thoracic surgery due to her lung disease and recent recovery from colon cancer. Likely plan for referral to SBRT, radiation oncology pending path results. Patient is agreeable to this plan. She will need a super D CT scan of the chest which has been ordered for planning as well as a nuclear medicine PET scan.  Both of which have been placed.  I spent 42 minutes dedicated to the care of this patient on the date of this encounter to include pre-visit review of records, face-to-face time with the patient discussing conditions above, post visit ordering of testing, clinical documentation with the electronic health record, making appropriate referrals as documented, and communicating necessary findings to members of the patients care  team.    Garner Nash, DO [facility] 08/16/2020 2:40 PM

## 2020-08-16 NOTE — H&P (View-Only) (Signed)
Synopsis: Referred in September 2020 for establish care with new pulmonologist former patient of Dr. Lake Bells, PCP: Raina Mina., MD  Subjective:   PATIENT ID: Desiree [name] GENDER: female DOB: July 09, 1945, MRN: [id number]  Chief Complaint  Patient presents with   Hospitalization Follow-up    Fluids removed from lungs at hospital, breathing treatments seem to helped and inhaler's working well.     75 year old female past medical history of COPD, pulmonary embolism, rheumatoid arthritis, 5 mg prednisone plus Plaquenil, former patient of Dr. Lake Bells.  COPD currently managed with Trelegy.  Has had several images following lung nodules since 2017.  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  PET scan July 2019 showed FDG activity SUV 1.348 mm left lower lobe nodule.  IgE 158, Rast panel positive for cat dander, dog dander and Aspergillus fumigate [place].  Last evaluation included sputum cultures, fungus AFB.  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  Appears patient had exacerbation in February and was treated with Levaquin.  As for her RA she is currently on Plaquenil.  Overall she has seen a significant decline in her physical ability.  She states that she is really noticed a significant increase in her dyspnea over the past several months.  Otherwise her respiratory complaints related to her COPD have been stable.  She uses her Trelegy inhaler regularly.  She occasionally describes a central "abnormal feeling in her chest with exertion but most of the time this all dissipates with resting.  She states even the time yes tasks make her severely fatigued as come paired to previous.  Patient denies fevers chills weight loss hemoptysis.  OV 11/08/2018: (Via telephone visit).  Patient scheduled for telephone visit to review CT imaging.  Patient found to have a 2 x 1.3 cm nodule abutting the  minor fissure discussed via telephone today.  OV 11/23/2018: Here today to review CT imaging in person.  Patient had pulmonary function test completed today prior to today's office visit.  PFTs revealed a ratio of 62, FEV1 51% predicted..  Also to review her CT imaging.  Patient very anxious about all of this that is going on.  She recently changed from Trelegy to Spiriva plus Symbicort.  She is doing well with her new inhaler regimen.  She feels less dyspneic.  Patient denies fevers chills night sweats weight loss, cough or sputum production.  OV 08/16/2020: Here today for follow-up after recent hospitalization.  She has been followed throughout our clinic since 2020 via telemetry visits.  Please reference those separate encounters for separate follow-up.  She has a lung nodule that we have been following for some time.  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  CT imaging of the chest was completed on presentationWhich revealed enlargement of the lung nodule that we have been following.  This bilobed left lower lobe nodule had showed slow progression in size.  Since we have been following this for some time I suspect we are dealing with a separate malignancy in comparison to her recent colon cancer diagnosis.  Patient was taken to the operating room by general surgery and ultimately discharged on 08/05/2020.  Here today for follow-up.  Recently saw general surgery in follow-up.  She has some drainage from  the incision site felt to be infected was started on antibiotics.  Respiratory standpoint she is stable.  On 2 L nasal cannula continuous.  Currently residing in an assisted living.  Has follow-up scheduled with medical oncology soon.    Past Medical History:  Diagnosis Date   AF (paroxysmal atrial fibrillation) (HCC)    Carcinoma of  ascending colon (HCC)    Cat allergies    COPD (chronic obstructive pulmonary disease) (HCC)    Hypertension    Osteoporosis    Pulmonary embolism ([place]) 2017   Rheumatoid arthritis(714.0)    Dr Tobie Lords follows     Family History  Problem Relation Age of Onset   Heart disease Father    Stroke Mother    Clotting disorder Mother    Heart disease Sister    Sleep apnea Sister    Emphysema Sister    Breast cancer Maternal Aunt    Emphysema Paternal Grandmother      Past Surgical History:  Procedure Laterality Date   BIOPSY  07/27/2020   Procedure: BIOPSY;  Surgeon: Gatha Mayer, MD;  Location: [facility] ENDOSCOPY;  Service: Endoscopy;;   COLONOSCOPY N/A 07/27/2020   Procedure: COLONOSCOPY;  Surgeon: Gatha Mayer, MD;  Location: [facility] ENDOSCOPY;  Service: Endoscopy;  Laterality: N/A;   CYSTECTOMY Right    breast   ESOPHAGOGASTRODUODENOSCOPY (EGD) WITH PROPOFOL N/A 07/27/2020   Procedure: ESOPHAGOGASTRODUODENOSCOPY (EGD) WITH PROPOFOL;  Surgeon: Gatha Mayer, MD;  Location: [facility];  Service: Endoscopy;  Laterality: N/A;   LAPAROSCOPIC RIGHT HEMI COLECTOMY Right 07/31/2020   Procedure: LAPAROSCOPIC ASSISSTED  RIGHT HEMI COLECTOMY;  Surgeon: Donnie Mesa, MD;  Location: [facility];  Service: General;  Laterality: Right;   LEFT HEART CATH AND CORONARY ANGIOGRAPHY N/A 01/05/2019   Procedure: LEFT HEART CATH AND CORONARY ANGIOGRAPHY;  Surgeon: Jettie Booze, MD;  Location: [facility];  Service: Cardiovascular;  Laterality: N/A;   SUBMUCOSAL TATTOO INJECTION  07/27/2020   Procedure: SUBMUCOSAL TATTOO INJECTION;  Surgeon: Gatha Mayer, MD;  Location: [facility] ENDOSCOPY;  Service: Endoscopy;;    Social History   Socioeconomic History   Marital status: Widowed    Spouse name: Not on file   Number of children: Not on file   Years of education: Not on file   Highest education level: Not on file  Occupational History   Occupation: retired  Tobacco Use   Smoking status:  Former    Packs/day: 1.00    Years: 40.00    Pack years: 40.00    Types: Cigarettes    Quit date: 02/03/2001    Years since quitting: 19.5   Smokeless tobacco: Never  Vaping Use   Vaping Use: Never used  Substance and Sexual Activity   Alcohol use: Yes    Alcohol/week: 2.0 standard drinks    Types: 2 Standard drinks or equivalent per week    Comment: 2 drinks daily   Drug use: No   Sexual activity: Not on file  Other Topics Concern   Not on file  Social History Narrative   Tobacco use, amount per day now: NONE   Past tobacco use, amount per day: 1 PACK PER DAY   How many years did you use tobacco:TOO MANY STOPPED IN 2003   Alcohol use (drinks per week): 14   Diet: GOOD   Do you drink/eat things with caffeine: YES   Marital status: WIDOW  What year were you married? 1967   Do you live in a house, apartment, assisted living, condo, trailer, etc.? HOUSE   Is it one or more stories? SPLIT LEVEL   How many persons live in your home? 1   Do you have pets in your home?( please list) NONE   Current or past profession: [employer]   Do you exercise?  NO                                Type and how often?   Do you have a living will? YES    Do you have a DNR form?  YES                                 If not, do you want to discuss one?   Do you have signed POA/HPOA forms?        ??              If so, please bring to you appointment      [facility] (05/16/16):   Originally from [place], [place]. 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 No pets currently. No bird, mold, or hot tub exposure.    Social Determinants of Health   Financial Resource Strain: Not on file  Food Insecurity: Not on file  Transportation Needs: Not on file  Physical Activity: Not on file  Stress: Not on file  Social Connections: Not on file  Intimate Partner Violence: Not on file     Allergies  Allergen Reactions   Other  Shortness Of Breath    Cats     Outpatient Medications Prior to Visit  Medication Sig Dispense Refill   acetaminophen (TYLENOL) 500 MG tablet Take 500 mg by mouth every 6 (six) hours as needed for moderate pain or headache.     albuterol (VENTOLIN HFA) 108 (90 Base) MCG/ACT inhaler Inhale 2 puffs into the lungs every 4 (four) hours as needed for wheezing or shortness of breath.     amLODipine (NORVASC) 5 MG tablet Take 1 tablet (5 mg total) by mouth daily. 90 tablet 1   apixaban (ELIQUIS) 2.5 MG TABS tablet Take 1 tablet (2.5 mg total) by mouth 2 (two) times daily. 60 tablet 5   atorvastatin (LIPITOR) 20 MG tablet Take 1 tablet (20 mg total) by mouth daily. 30 tablet 1   Budeson-Glycopyrrol-Formoterol (BREZTRI AEROSPHERE) 160-9-4.8 MCG/ACT AERO Inhale 2 puffs into the lungs 2 (two) times daily. 10.7 g 4   carvedilol (COREG) 3.125 MG tablet Take 1 tablet (3.125 mg total) by mouth 2 (two) times daily with a meal. 60 tablet 0   cholecalciferol (VITAMIN D) 25 MCG (1000 UT) tablet Take 1,000 Units by mouth daily.      ferrous sulfate 325 (65 FE) MG EC tablet Take 325 mg by mouth daily with breakfast.     fexofenadine (ALLEGRA) 180 MG tablet Take 180 mg by mouth daily.     hydroxychloroquine (PLAQUENIL) 200 MG tablet Take 200 mg by mouth 2 (two) times daily.   3   leflunomide (ARAVA) 20 MG tablet Take 20 mg by mouth daily.     montelukast (SINGULAIR) 10 MG tablet Take 1 tablet (10 mg total) by mouth at bedtime. 90 tablet 1   omeprazole (PRILOSEC) 20 MG capsule  Take 1 capsule (20 mg total) by mouth daily. 90 capsule 1   ondansetron (ZOFRAN-ODT) 4 MG disintegrating tablet Take 1 tablet (4 mg total) by mouth every 8 (eight) hours as needed for nausea or vomiting. 10 tablet 0   predniSONE (DELTASONE) 5 MG tablet Take 5 mg by mouth daily with breakfast.     sertraline (ZOLOFT) 100 MG tablet Take 100 mg by mouth daily.      valsartan (DIOVAN) 320 MG tablet Take 320 mg by mouth daily.     vitamin B-12  (CYANOCOBALAMIN) 1000 MCG tablet Take 1,000 mcg by mouth daily.      No facility-administered medications prior to visit.    Review of Systems  Constitutional:  Positive for malaise/fatigue. Negative for chills, fever and weight loss.  HENT:  Negative for hearing loss, sore throat and tinnitus.   Eyes:  Negative for blurred vision and double vision.  Respiratory:  Positive for shortness of breath. Negative for cough, hemoptysis, sputum production, wheezing and stridor.   Cardiovascular:  Negative for chest pain, palpitations, orthopnea, leg swelling and PND.  Gastrointestinal:  Positive for abdominal pain. Negative for constipation, diarrhea, heartburn, nausea and vomiting.  Genitourinary:  Negative for dysuria, hematuria and urgency.  Musculoskeletal:  Negative for joint pain and myalgias.  Skin:  Negative for itching and rash.  Neurological:  Negative for dizziness, tingling, weakness and headaches.  Endo/Heme/Allergies:  Negative for environmental allergies. Does not bruise/bleed easily.  Psychiatric/Behavioral:  Negative for depression. The patient is not nervous/anxious and does not have insomnia.   All other systems reviewed and are negative.   Objective:  Physical Exam Vitals reviewed.  Constitutional:      General: She is not in acute distress.    Appearance: She is well-developed.  HENT:     Head: Normocephalic and atraumatic.  Eyes:     General: No scleral icterus.    Conjunctiva/sclera: Conjunctivae normal.     Pupils: Pupils are equal, round, and reactive to light.  Neck:     Vascular: No JVD.     Trachea: No tracheal deviation.  Cardiovascular:     Rate and Rhythm: Normal rate and regular rhythm.     Heart sounds: Normal heart sounds. No murmur heard. Pulmonary:     Effort: Pulmonary effort is normal. No tachypnea, accessory muscle usage or respiratory distress.     Breath sounds: No stridor. No wheezing, rhonchi or rales.     Comments: Diminished breath sounds  bilaterally Abdominal:     General: Bowel sounds are normal. There is no distension.     Tenderness: There is no abdominal tenderness.     Comments: Dressing, central abdomen, did not take this down.  Recent looked at by surgery on Monday.  Does appear to have some drainage.  Musculoskeletal:        General: No tenderness.     Cervical back: Neck supple.  Lymphadenopathy:     Cervical: No cervical adenopathy.  Skin:    General: Skin is warm and dry.     Capillary Refill: Capillary refill takes less than 2 seconds.     Findings: No rash.  Neurological:     Mental Status: She is alert and oriented to person, place, and time.  Psychiatric:        Behavior: Behavior normal.     Vitals:   08/16/20 1419  BP: 132/60  Pulse: 93  SpO2: 95%  Weight: 117 lb 6.4 oz (53.3 kg)  Height: _0  (1.549  m)   95% on RA BMI Readings from Last 3 Encounters:  08/16/20 22.18 kg/m  08/05/20 27.37 kg/m  05/25/19 25.51 kg/m   Wt Readings from Last 3 Encounters:  08/16/20 117 lb 6.4 oz (53.3 kg)  08/05/20 144 lb 13.5 oz (65.7 kg)  05/25/19 135 lb (61.2 kg)     CBC    Component Value Date/Time   WBC 9.2 08/05/2020 0114   RBC 3.12 (L) 08/05/2020 0114   HGB 9.1 (L) 08/05/2020 0114   HGB 11.1 09/30/2019 1151   HCT 28.6 (L) 08/05/2020 0114   HCT 33.4 (L) 09/30/2019 1151   PLT 275 08/05/2020 0114   PLT 333 09/30/2019 1151   MCV 91.7 08/05/2020 0114   MCV 99 (H) 09/30/2019 1151   MCH 29.2 08/05/2020 0114   MCHC 31.8 08/05/2020 0114   RDW 18.4 (H) 08/05/2020 0114   RDW 12.3 09/30/2019 1151   LYMPHSABS 0.4 (L) 07/16/2020 1605   LYMPHSABS 0.6 (L) 10/03/2015 1512   MONOABS 1.0 07/16/2020 1605   EOSABS 0.0 07/16/2020 1605   EOSABS 0.2 10/03/2015 1512   BASOSABS 0.1 07/16/2020 1605   BASOSABS 0.0 10/03/2015 1512    Chest Imaging: CT chest 03/03/2017: Evidence of infectious bronchiolitis, 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   Moderate emphysema. The patient's images have been independently reviewed by me.    CT chest September 2020: Reviewed with patient today in the office.  Does have 2 cm right upper lobe nodule with area of inflammatory appearing groundglass around this. Also has evidence of MAI and cylindric bronchiectasis. The patient's images have been independently reviewed by me.    CT chest 07/28/2020: Small pleural effusion on the right side, slowly enlarging left lower lobe pulmonary nodule concerning for a primary bronchogenic carcinoma.  Has increased in size comparison to previous imaging. The patient's images have been independently reviewed by me.    Pulmonary Functions Testing Results: PFT Results Latest Ref Rng & Units 11/23/2018 09/05/2016  FVC-Pre L 1.46 1.71  FVC-Predicted Pre % 59 67  FVC-Post L 1.52 1.75  FVC-Predicted Post % 61 69  Pre FEV1/FVC % % 63 66  Post FEV1/FCV % % 62 69  FEV1-Pre L 0.91 1.13  FEV1-Predicted Pre % 49 59  FEV1-Post L 0.94 1.21  DLCO uncorrected ml/min/mmHg 9.72 13.43  DLCO UNC% % 56 66  DLCO corrected ml/min/mmHg - 14.03  DLCO COR %Predicted % - 69  DLVA Predicted % 72 86  TLC L 4.17 4.77  TLC % Predicted % 90 103  RV % Predicted % 113 128    FeNO: None   Pathology: None  07/31/2020: SURGICAL PATHOLOGY  THIS IS AN ADDENDUM REPORT CASE: [id number]  PATIENT: Desiree [name]  Surgical Pathology Report  ddendum   Reason for Addendum #1:  DNA Mismatch Repair IHC Results   Clinical History: right colon mass (cm)  FINAL MICROSCOPIC DIAGNOSIS:   A. COLON, RIGHT AND TERMINAL ILEUM, HEMI-COLECTOMY:  -  Adenocarcinoma, poorly differentiated, 3.4 cm  -  Metastatic carcinoma involving one of sixteen lymph nodes (1/16)  -  Margins uninvolved by carcinoma  -  Multiple tubular adenomas (0.8 cm; largest)  -  Benign appendix  -  See oncology table and comment below   Echocardiogram: None   Heart Catheterization: None     Assessment & Plan:      ICD-10-CM   1. Lung nodule, solitary  R91.1     2. Stage 2 moderate COPD by  GOLD classification ([place])  J44.9     3. Lung nodule  R91.1     4. Bronchiectasis without complication ([place])  P32.9     5. Chronic respiratory failure with hypoxia (HCC)  J96.11       Discussion:  This is a 75 year old female, moderate COPD, FEV1 of 51% predicted.  Baseline chronic hypoxemic respiratory failure on 2 L nasal cannula.  Patient recently underwent surgery for a right-sided colon adenocarcinoma with resection.  1 node within the dissection was positive for metastasis.  Patient has follow-up scheduled with medical oncology at this time to discuss next steps.  She has been recovering well from this surgery.  Of note was found to have a nodule that is slowly enlarging.  We have been following this for some time in the outpatient setting.  Plan: We discussed today the risk benefits and alternatives of proceeding with tissue biopsy. Patient is understandable of the risk to include bleeding and pneumothorax. We will plan for a robotic assisted bronchoscopy with tissue sampling of the left lower lobe nodule. Likely plan for fiducial placement. I do not think she be a good candidate for thoracic surgery due to her lung disease and recent recovery from colon cancer. Likely plan for referral to SBRT, radiation oncology pending path results. Patient is agreeable to this plan. She will need a super D CT scan of the chest which has been ordered for planning as well as a nuclear medicine PET scan.  Both of which have been placed.  I spent 42 minutes dedicated to the care of this patient on the date of this encounter to include pre-visit review of records, face-to-face time with the patient discussing conditions above, post visit ordering of testing, clinical documentation with the electronic health record, making appropriate referrals as documented, and communicating necessary findings to members of the patients care  team.    Garner Nash, DO [facility] 08/16/2020 2:40 PM

## 2020-08-16 NOTE — Patient Instructions (Addendum)
Thank you for visiting Dr. Valeta Harms at [facility]. Today we recommend the following:  Orders Placed This Encounter  Procedures   Procedural/ Surgical Case Request: VIDEO BRONCHOSCOPY WITH ENDOBRONCHIAL NAVIGATION   NM PET Image Initial (PI) Skull Base To Thigh   CT Super D Chest Wo Contrast   Ambulatory referral to Pulmonology   Tentative bronchoscopy date: 09/11/2020  Return in about 3 months (around 11/16/2020) for with APP or Dr. Valeta Harms.    Please do your part to reduce the spread of COVID-19.

## 2020-08-22 ENCOUNTER — Other Ambulatory Visit: Payer: Medicare PPO

## 2020-08-22 NOTE — Progress Notes (Signed)
[place] [facility]  [address] [place],  [place]  [address] [phone]  Clinic Day:  08/23/2020  Referring physician: Donnie Mesa, MD   HISTORY OF PRESENT ILLNESS:  The patient is a 75 y.o. female  who I was asked to consult upon for newly diagnosed colon cancer.  Her history dates back for approximately the last 2 months as she has had progressive fatigue.  As this got worse, she was brought into the hospital for further evaluation. While there, a CBC was done, which showed a low hemoglobin of 8.  Her anemia led to a colonoscopy being done, which showed a mass in her ascending colon.  A biopsy of this lesion showed adenocarcinoma.   This led to her undergoing a right hemicolectomy in late June 2022, whose pathology revealed a poorly differentiated adenocarcinoma extending into the muscularis propria of her bowel ball.  16 total lymph nodes were removed, for which 1 contained cancer.  Of note, the patient claims she had a colonoscopy just 18 months ago, which came negative.  The patient claims there is no family history of colorectal cancer.  The patient has recuperated slowly from her recent surgery, primarily related to her other comorbidities.  Of note, CT scans were done before her surgery, which showed no evidence of metastatic disease.  However, these scans did show that her previously known bilobed left lung mass had grown in size.  She is scheduled for a bronchoscopy in August 2022 to assess this lesion further.    PAST MEDICAL HISTORY:   Past Medical History:  Diagnosis Date   AF (paroxysmal atrial fibrillation) (HCC)    Carcinoma of ascending colon (HCC)    Cat allergies    COPD (chronic obstructive pulmonary disease) (HCC)    Hypertension    Osteoporosis    Pulmonary embolism ([place]) 2017   Rheumatoid arthritis(714.0)    Dr Tobie Lords follows    PAST SURGICAL HISTORY:   Past Surgical History:  Procedure Laterality Date   BIOPSY   07/27/2020   Procedure: BIOPSY;  Surgeon: Gatha Mayer, MD;  Location: [facility] ENDOSCOPY;  Service: Endoscopy;;   COLONOSCOPY N/A 07/27/2020   Procedure: COLONOSCOPY;  Surgeon: Gatha Mayer, MD;  Location: [facility] ENDOSCOPY;  Service: Endoscopy;  Laterality: N/A;   CYSTECTOMY Right    breast   ESOPHAGOGASTRODUODENOSCOPY (EGD) WITH PROPOFOL N/A 07/27/2020   Procedure: ESOPHAGOGASTRODUODENOSCOPY (EGD) WITH PROPOFOL;  Surgeon: Gatha Mayer, MD;  Location: [facility];  Service: Endoscopy;  Laterality: N/A;   LAPAROSCOPIC RIGHT HEMI COLECTOMY Right 07/31/2020   Procedure: LAPAROSCOPIC ASSISSTED  RIGHT HEMI COLECTOMY;  Surgeon: Donnie Mesa, MD;  Location: [facility];  Service: General;  Laterality: Right;   LEFT HEART CATH AND CORONARY ANGIOGRAPHY N/A 01/05/2019   Procedure: LEFT HEART CATH AND CORONARY ANGIOGRAPHY;  Surgeon: Jettie Booze, MD;  Location: [facility];  Service: Cardiovascular;  Laterality: N/A;   SUBMUCOSAL TATTOO INJECTION  07/27/2020   Procedure: SUBMUCOSAL TATTOO INJECTION;  Surgeon: Gatha Mayer, MD;  [facility] ENDOSCOPY;  Service: Endoscopy;;    CURRENT MEDICATIONS:   Current Outpatient Medications  Medication Sig Dispense Refill   acetaminophen (TYLENOL) 500 MG tablet Take 500 mg by mouth every 6 (six) hours as needed for moderate pain or headache.     albuterol (VENTOLIN HFA) 108 (90 Base) MCG/ACT inhaler Inhale 2 puffs into the lungs every 4 (four) hours as needed for wheezing or shortness of breath.     amLODipine (  NORVASC) 5 MG tablet Take 1 tablet (5 mg total) by mouth daily. 90 tablet 1   apixaban (ELIQUIS) 2.5 MG TABS tablet Take 1 tablet (2.5 mg total) by mouth 2 (two) times daily. 60 tablet 5   atorvastatin (LIPITOR) 20 MG tablet Take 1 tablet (20 mg total) by mouth daily. 30 tablet 1   Budeson-Glycopyrrol-Formoterol (BREZTRI AEROSPHERE) 160-9-4.8 MCG/ACT AERO Inhale 2 puffs into the lungs 2 (two) times daily. 10.7 g 4   carvedilol (COREG) 3.125 MG  tablet Take 1 tablet (3.125 mg total) by mouth 2 (two) times daily with a meal. 60 tablet 0   cholecalciferol (VITAMIN D) 25 MCG (1000 UT) tablet Take 1,000 Units by mouth daily.      ferrous sulfate 325 (65 FE) MG EC tablet Take 325 mg by mouth daily with breakfast.     fexofenadine (ALLEGRA) 180 MG tablet Take 180 mg by mouth daily.     hydroxychloroquine (PLAQUENIL) 200 MG tablet Take 200 mg by mouth 2 (two) times daily.   3   leflunomide (ARAVA) 20 MG tablet Take 20 mg by mouth daily.     montelukast (SINGULAIR) 10 MG tablet Take 1 tablet (10 mg total) by mouth at bedtime. 90 tablet 1   omeprazole (PRILOSEC) 20 MG capsule Take 1 capsule (20 mg total) by mouth daily. 90 capsule 1   ondansetron (ZOFRAN-ODT) 4 MG disintegrating tablet Take 1 tablet (4 mg total) by mouth every 8 (eight) hours as needed for nausea or vomiting. 10 tablet 0   predniSONE (DELTASONE) 5 MG tablet Take 5 mg by mouth daily with breakfast.     sertraline (ZOLOFT) 100 MG tablet Take 100 mg by mouth daily.      valsartan (DIOVAN) 320 MG tablet Take 320 mg by mouth daily.     vitamin B-12 (CYANOCOBALAMIN) 1000 MCG tablet Take 1,000 mcg by mouth daily.      No current facility-administered medications for this visit.    ALLERGIES:   Allergies  Allergen Reactions   Other Shortness Of Breath    Cats    FAMILY HISTORY:   Family History  Problem Relation Age of Onset   Heart disease Father    Stroke Mother    Clotting disorder Mother    Heart disease Sister    Sleep apnea Sister    Emphysema Sister    Breast cancer Maternal Aunt    Emphysema Paternal Grandmother     SOCIAL HISTORY:  The patient was born and raised in [place].  [place]  She is widowed, with 2 children and 3 grandchildren.  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  She smoked a pack of cigarettes daily for 43 years before quitting 19 years ago.  She drinks an occasional glass of  wine.    REVIEW OF SYSTEMS:  Review of Systems  Constitutional:  Positive for unexpected weight change. Negative for fatigue and fever.  HENT:   Positive for hearing loss. Negative for sore throat.   Eyes:  Negative for eye problems.  Respiratory:  Positive for shortness of breath. Negative for chest tightness, cough and hemoptysis.        On 2L of oxygen  Cardiovascular:  Negative for chest pain and palpitations.  Gastrointestinal:  Positive for diarrhea. Negative for abdominal distention, abdominal pain, blood in stool, constipation, nausea and vomiting.  Endocrine: Negative for hot flashes.  Genitourinary:  Negative for difficulty urinating, dysuria, frequency, hematuria and  nocturia.   Musculoskeletal:  Positive for arthralgias. Negative for back pain, gait problem and myalgias.  Skin: Negative.  Negative for itching and rash.  Neurological: Negative.  Negative for dizziness, extremity weakness, gait problem, headaches, light-headedness and numbness.  Hematological: Negative.   Psychiatric/Behavioral: Negative.  Negative for depression and suicidal ideas. The patient is not nervous/anxious.     PHYSICAL EXAM:  Blood pressure 135/62, pulse 85, temperature 98.2 F (36.8 C), resp. rate 14, height 5\' 1"  (1.549 m), weight 117 lb 8 oz (53.3 kg), SpO2 95 %. Wt Readings from Last 3 Encounters:  08/23/20 117 lb 8 oz (53.3 kg)  08/16/20 117 lb 6.4 oz (53.3 kg)  08/05/20 144 lb 13.5 oz (65.7 kg)   Body mass index is 22.2 kg/m. Performance status (ECOG): 3 - Symptomatic, >50% confined to bed Physical Exam Constitutional:      Appearance: Normal appearance. She is ill-appearing.     Comments: Chronically-ill appearing woman in a wheelchair, wearing oxygen per nasal canula  HENT:     Mouth/Throat:     Mouth: Mucous membranes are moist.     Pharynx: Oropharynx is clear. No oropharyngeal exudate or posterior oropharyngeal erythema.  Cardiovascular:     Rate and Rhythm: Normal rate and  regular rhythm.     Heart sounds: No murmur heard.   No friction rub. No gallop.  Pulmonary:     Effort: Pulmonary effort is normal. No respiratory distress.     Breath sounds: Normal breath sounds. No wheezing, rhonchi or rales.     Comments: Diffusely decreased breath sounds bilaterally Chest:  Breasts:    Right: No axillary adenopathy or supraclavicular adenopathy.     Left: No axillary adenopathy or supraclavicular adenopathy.  Abdominal:     General: Bowel sounds are normal. There is no distension.     Palpations: Abdomen is soft. There is no mass.     Tenderness: There is no abdominal tenderness.  Musculoskeletal:        General: No swelling.     Right lower leg: No edema.     Left lower leg: No edema.  Lymphadenopathy:     Cervical: No cervical adenopathy.     Upper Body:     Right upper body: No supraclavicular or axillary adenopathy.     Left upper body: No supraclavicular or axillary adenopathy.     Lower Body: No right inguinal adenopathy. No left inguinal adenopathy.  Skin:    General: Skin is warm.     Coloration: Skin is not jaundiced.     Findings: No lesion or rash.  Neurological:     General: No focal deficit present.     Mental Status: She is alert and oriented to person, place, and time. Mental status is at baseline.     Cranial Nerves: Cranial nerves are intact.  Psychiatric:        Mood and Affect: Mood normal.        Behavior: Behavior normal.        Thought Content: Thought content normal.    LABS:   CBC Latest Ref Rng & Units 08/23/2020 08/05/2020 08/04/2020  WBC - 4.4 9.2 7.5  Hemoglobin 12.0 - 16.0 9.3(A) 9.1(L) 9.3(L)  Hematocrit 36 - 46 29(A) 28.6(L) 28.9(L)  Platelets 150 - 399 228 275 252   CMP Latest Ref Rng & Units 08/23/2020 08/05/2020 08/04/2020  Glucose 70 - 99 mg/dL - 88 111(H)  BUN 4 - 21 8 7(L) 8  Creatinine 0.5 - 1.1  0.5 0.56 0.58  Sodium 137 - 147 129(A) 132(L) 134(L)  Potassium 3.4 - 5.3 3.7 3.8 5.0  Chloride 99 - 108 97(A) 98 100   CO2 13 - 22 28(A) 27 24  Calcium 8.7 - 10.7 8.7 8.2(L) 8.4(L)  Total Protein 6.5 - 8.1 g/dL - - -  Total Bilirubin 0.3 - 1.2 mg/dL - - -  Alkaline Phos 25 - 125 84 - -  AST 13 - 35 41(A) - -  ALT 7 - 35 23 - -    CEA  Date Value Ref Range Status  08/23/2020 9.4 (H) 0.0 - 4.7 ng/mL Final    Comment:    (NOTE)                             Nonsmokers          <3.9                             Smokers             <5.6 Roche [employer] Electrochemiluminescence Immunoassay (ECLIA) Values obtained with different assay methods or kits cannot be used interchangeably.  Results cannot be interpreted as absolute evidence of the presence or absence of malignant disease. Performed At: [facility] [place], [place] [id number] Rush Farmer MD IN:[id number]     ASSESSMENT & PLAN:  A 75 y.o. 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  Normally, adjuvant chemotherapy in the form of Xeloda +/- oxaliplatin would be considered in a patient over 70 with stage III colon cancer.  However, this patient has a very poor performance status to where I am very concerned adjuvant chemotherapy would make her even more debilitated.  Her other significant comorbidities, including her COPD and deconditioning, may prevent her from ever being considered for adjuvant chemotherapy.  I want her to use these upcoming weeks to continue to get her health better before even considering adjuvant treatment.  As mentioned previously, she is scheduled for a bronchoscopy next month to evaluate her enlarging left lung nodule.  I will see this patient back in 6 weeks for repeat clinical assessment .  The patient understands all the plans discussed today and is in agreement with them.  I do appreciate Donnie Mesa, MD for his new consult.   Yosselin Zoeller Macarthur Critchley, MD

## 2020-08-23 ENCOUNTER — Telehealth: Payer: Self-pay | Admitting: Cardiology

## 2020-08-23 ENCOUNTER — Other Ambulatory Visit: Payer: Self-pay | Admitting: Oncology

## 2020-08-23 ENCOUNTER — Inpatient Hospital Stay: Payer: Medicare PPO

## 2020-08-23 ENCOUNTER — Telehealth: Payer: Self-pay | Admitting: Oncology

## 2020-08-23 ENCOUNTER — Other Ambulatory Visit: Payer: Self-pay

## 2020-08-23 ENCOUNTER — Encounter: Payer: Self-pay | Admitting: Oncology

## 2020-08-23 ENCOUNTER — Inpatient Hospital Stay: Payer: Medicare PPO | Attending: Oncology | Admitting: Oncology

## 2020-08-23 VITALS — BP 135/62 | HR 85 | Temp 98.2°F | Resp 14 | Ht 61.0 in | Wt 117.5 lb

## 2020-08-23 DIAGNOSIS — Z86711 Personal history of pulmonary embolism: Secondary | ICD-10-CM

## 2020-08-23 DIAGNOSIS — J449 Chronic obstructive pulmonary disease, unspecified: Secondary | ICD-10-CM | POA: Diagnosis not present

## 2020-08-23 DIAGNOSIS — C182 Malignant neoplasm of ascending colon: Secondary | ICD-10-CM

## 2020-08-23 DIAGNOSIS — Z903 Acquired absence of stomach [part of]: Secondary | ICD-10-CM

## 2020-08-23 LAB — BASIC METABOLIC PANEL
BUN: 8 (ref 4–21)
CO2: 28 — AB (ref 13–22)
Chloride: 97 — AB (ref 99–108)
Creatinine: 0.5 (ref 0.5–1.1)
Glucose: 103
Potassium: 3.7 (ref 3.4–5.3)
Sodium: 129 — AB (ref 137–147)

## 2020-08-23 LAB — CBC AND DIFFERENTIAL
HCT: 29 — AB (ref 36–46)
Hemoglobin: 9.3 — AB (ref 12.0–16.0)
Neutrophils Absolute: 3.43
Platelets: 228 (ref 150–399)
WBC: 4.4

## 2020-08-23 LAB — IRON AND TIBC
Iron: 23 ug/dL — ABNORMAL LOW (ref 28–170)
Saturation Ratios: 11 % (ref 10.4–31.8)
TIBC: 214 ug/dL — ABNORMAL LOW (ref 250–450)
UIBC: 191 ug/dL

## 2020-08-23 LAB — HEPATIC FUNCTION PANEL
ALT: 23 (ref 7–35)
AST: 41 — AB (ref 13–35)
Alkaline Phosphatase: 84 (ref 25–125)
Bilirubin, Total: 0.4

## 2020-08-23 LAB — COMPREHENSIVE METABOLIC PANEL
Albumin: 3.2 — AB (ref 3.5–5.0)
Calcium: 8.7 (ref 8.7–10.7)

## 2020-08-23 LAB — CBC: RBC: 3.18 — AB (ref 3.87–5.11)

## 2020-08-23 LAB — FERRITIN: Ferritin: 125 ng/mL (ref 11–307)

## 2020-08-23 NOTE — Telephone Encounter (Signed)
    Pt is requesting to switch from Dr. Harriet Masson to Dr. Geraldo Pitter, pt can only go to [facility] office

## 2020-08-23 NOTE — Telephone Encounter (Signed)
Per 7/21 los next appt scheduled and given to patient

## 2020-08-24 LAB — CEA: CEA: 9.4 ng/mL — ABNORMAL HIGH (ref 0.0–4.7)

## 2020-08-29 ENCOUNTER — Telehealth: Payer: Self-pay | Admitting: Pulmonary Disease

## 2020-08-29 ENCOUNTER — Other Ambulatory Visit: Payer: Self-pay | Admitting: Oncology

## 2020-08-29 ENCOUNTER — Inpatient Hospital Stay: Payer: Medicare PPO | Admitting: Pulmonary Disease

## 2020-08-29 DIAGNOSIS — C182 Malignant neoplasm of ascending colon: Secondary | ICD-10-CM

## 2020-08-29 NOTE — Telephone Encounter (Signed)
Pt's son Eric(okay per dpr) pt tested positive for covid at assisted living on 08/24/20 (asymptomatic). Pt is scheduled to have pet and ct scans 09/04/20. Pt son wanting to make sure she only has to test negative PCR the day of the scan and not be affected if she's tested positive in the last 30 days . Randall Hiss also know if BI needs her to have pet and ct before biopsy. Randall Hiss wondering if CT and PET for some reason do need to be rescheduled does that also push back the biopsy?Randall Hiss is flying in for the biopsy and needs to know if he needs to rearrange flight. Please advise [phone]

## 2020-08-29 NOTE — Telephone Encounter (Signed)
Called and spoke with patient's son Randall Hiss ([place]), I let him know that as long as his mom as been 10 days since testing positive and still not showing any symptoms of Covid, she should be fine to have the scans.  He is going to find out when they last tested her for covid as well.  I advised him that she could test positive for up to 90 days.  That is what he was concerned about with her upcoming biopsy.  I told him to let us know if at any time she begins to have any symptoms of covid.  Advised I would send a message to Dr. Valeta Harms to get his recommendations regarding her positive covid status and her upcoming scans and biopsy.  I told him once we heard back from Dr. Valeta Harms, we would call him back and let him know. He verbalized understanding.  Dr. Valeta Harms, Please advise regarding positive Covid status (asymptomatic) and upcoming scans/biopsy.  Thank you.

## 2020-08-30 NOTE — Telephone Encounter (Signed)
Lmtcb for [employer].

## 2020-08-30 NOTE — Telephone Encounter (Signed)
ATC x2, left detailed message for Randall Hiss per dpr.  Advised to call the office.

## 2020-09-03 ENCOUNTER — Encounter (HOSPITAL_COMMUNITY): Payer: Self-pay | Admitting: Oncology

## 2020-09-04 ENCOUNTER — Ambulatory Visit (HOSPITAL_COMMUNITY)
Admission: RE | Admit: 2020-09-04 | Discharge: 2020-09-04 | Disposition: A | Discharge status: Home / Self Care | Payer: Medicare PPO | Source: Ambulatory Visit | Attending: Pulmonary Disease | Admitting: Pulmonary Disease

## 2020-09-04 ENCOUNTER — Other Ambulatory Visit: Payer: Self-pay

## 2020-09-04 DIAGNOSIS — J439 Emphysema, unspecified: Secondary | ICD-10-CM | POA: Insufficient documentation

## 2020-09-04 DIAGNOSIS — J449 Chronic obstructive pulmonary disease, unspecified: Secondary | ICD-10-CM | POA: Insufficient documentation

## 2020-09-04 DIAGNOSIS — I700 Atherosclerosis of aorta: Secondary | ICD-10-CM | POA: Diagnosis not present

## 2020-09-04 DIAGNOSIS — R911 Solitary pulmonary nodule: Secondary | ICD-10-CM

## 2020-09-04 DIAGNOSIS — M25411 Effusion, right shoulder: Secondary | ICD-10-CM | POA: Insufficient documentation

## 2020-09-04 DIAGNOSIS — M24411 Recurrent dislocation, right shoulder: Secondary | ICD-10-CM | POA: Insufficient documentation

## 2020-09-04 LAB — GLUCOSE, CAPILLARY: Glucose-Capillary: 100 mg/dL — ABNORMAL HIGH (ref 70–99)

## 2020-09-04 MED ORDER — FLUDEOXYGLUCOSE F - 18 (FDG) INJECTION
6.0000 | Freq: Once | INTRAVENOUS | Status: AC | PRN
  Administered 2020-09-04: 6.01 via INTRAVENOUS

## 2020-09-05 ENCOUNTER — Encounter (HOSPITAL_COMMUNITY): Payer: Self-pay | Admitting: Oncology

## 2020-09-06 NOTE — Telephone Encounter (Signed)
Detailed message left on VM for Eric. Pt had PET scan on 8/2. Will close encounter.

## 2020-09-10 ENCOUNTER — Other Ambulatory Visit: Payer: Self-pay

## 2020-09-10 ENCOUNTER — Encounter (HOSPITAL_COMMUNITY): Payer: Self-pay | Admitting: Pulmonary Disease

## 2020-09-10 NOTE — Progress Notes (Signed)
  PRE-OP INSTRUCTIONS FOR Desiree [name]:  Your procedure is scheduled on Tuesday 8/9. Please report to [facility] Main Entrance "A" at 06:15 A.M., and check in at the Admitting office. Call this number if you have problems the morning of surgery: [phone]   Remember: Do not eat or drink after midnight the night before your surgery   Medications to take morning of surgery with a sip of water include: If patient usually takes any meds at night, please follow normal regimen)   amLODipine (NORVASC)  atorvastatin (LIPITOR)  carvedilol (COREG) hydroxychloroquine (PLAQUENIL) leflunomide (ARAVA)  omeprazole (PRILOSEC)  predniSONE (DELTASONE) sertraline (ZOLOFT)   If needed: Inhaler --- Please bring all inhalers with you the day of surgery.  Zofran Tylenol    As of today, STOP taking any Aspirin (unless otherwise instructed by your surgeon), Aleve, Naproxen, Ibuprofen, Motrin, Advil, Goody's, BC's, all herbal medications, fish oil, and all vitamins.    The Morning of Surgery Do not wear jewelry, make-up or nail polish. Do not wear lotions, powders, or perfumes, or deodorant Do not shave 48 hours prior to surgery.   Do not bring valuables to the hospital. [facility] is not responsible for any belongings or valuables.  If you are a smoker, DO NOT Smoke 24 hours prior to surgery  If you wear a CPAP at night please bring your mask the morning of surgery   Remember that you must have someone to transport you home after your surgery, and remain with you for 24 hours if you are discharged the same day.  Please bring cases for contacts, glasses, hearing aids, dentures or bridgework because it cannot be worn into surgery.   Patients discharged the day of surgery will not be allowed to drive home.   Please shower the NIGHT BEFORE/MORNING OF SURGERY (use antibacterial soap like DIAL soap if possible). Wear comfortable clothes the morning of surgery. Oral Hygiene is also important to  reduce your risk of infection.  Remember - BRUSH YOUR TEETH THE MORNING OF SURGERY WITH YOUR REGULAR TOOTHPASTE  Patient denies shortness of breath, fever, cough and chest pain.

## 2020-09-10 NOTE — Progress Notes (Signed)
PCP - Dr. Bea Graff (per pt)   Chart states Desiree Buff, NP Cardiologist - Berniece Salines, MD   Chest x-ray -  EKG - 08/04/20 Stress Test -  ECHO - 11/29/18 Cardiac Cath - 01/05/19  Blood Thinner Instructions: per pt and Lelon Frohlich (Resident Care Coordinator at [facility]) pt has stopped Eliquis for about a wekk Aspirin Instructions:   COVID TEST- DOS    Anesthesia review: n/a  Patient denies shortness of breath, fever, cough and chest pain   Information provided by both pt and resident care coordinator  Pre-op instructions (See additional note) faxed to [place] [facility] and instructions also provided to pt.

## 2020-09-11 ENCOUNTER — Ambulatory Visit (HOSPITAL_COMMUNITY): Payer: Medicare PPO | Admitting: Certified Registered Nurse Anesthetist

## 2020-09-11 ENCOUNTER — Encounter (HOSPITAL_COMMUNITY)
Admission: RE | Disposition: A | Discharge status: Home / Self Care | Payer: Self-pay | Source: Home / Self Care | Attending: Pulmonary Disease

## 2020-09-11 ENCOUNTER — Ambulatory Visit (HOSPITAL_COMMUNITY)
Admission: RE | Admit: 2020-09-11 | Discharge: 2020-09-11 | Disposition: A | Discharge status: Home / Self Care | Payer: Medicare PPO | Attending: Pulmonary Disease | Admitting: Pulmonary Disease

## 2020-09-11 ENCOUNTER — Ambulatory Visit (HOSPITAL_COMMUNITY): Payer: Medicare PPO

## 2020-09-11 ENCOUNTER — Encounter (HOSPITAL_COMMUNITY): Payer: Self-pay | Admitting: Pulmonary Disease

## 2020-09-11 DIAGNOSIS — R911 Solitary pulmonary nodule: Secondary | ICD-10-CM | POA: Diagnosis not present

## 2020-09-11 DIAGNOSIS — M069 Rheumatoid arthritis, unspecified: Secondary | ICD-10-CM | POA: Diagnosis not present

## 2020-09-11 DIAGNOSIS — Z419 Encounter for procedure for purposes other than remedying health state, unspecified: Secondary | ICD-10-CM

## 2020-09-11 DIAGNOSIS — Z85038 Personal history of other malignant neoplasm of large intestine: Secondary | ICD-10-CM | POA: Diagnosis not present

## 2020-09-11 DIAGNOSIS — Z7952 Long term (current) use of systemic steroids: Secondary | ICD-10-CM | POA: Insufficient documentation

## 2020-09-11 DIAGNOSIS — Z7901 Long term (current) use of anticoagulants: Secondary | ICD-10-CM | POA: Diagnosis not present

## 2020-09-11 DIAGNOSIS — J449 Chronic obstructive pulmonary disease, unspecified: Secondary | ICD-10-CM | POA: Insufficient documentation

## 2020-09-11 DIAGNOSIS — Z7951 Long term (current) use of inhaled steroids: Secondary | ICD-10-CM | POA: Insufficient documentation

## 2020-09-11 DIAGNOSIS — C3432 Malignant neoplasm of lower lobe, left bronchus or lung: Secondary | ICD-10-CM | POA: Insufficient documentation

## 2020-09-11 DIAGNOSIS — Z9889 Other specified postprocedural states: Secondary | ICD-10-CM

## 2020-09-11 DIAGNOSIS — Z86711 Personal history of pulmonary embolism: Secondary | ICD-10-CM | POA: Diagnosis not present

## 2020-09-11 DIAGNOSIS — Z87891 Personal history of nicotine dependence: Secondary | ICD-10-CM | POA: Diagnosis not present

## 2020-09-11 DIAGNOSIS — U071 COVID-19: Secondary | ICD-10-CM | POA: Diagnosis not present

## 2020-09-11 HISTORY — PX: VIDEO BRONCHOSCOPY WITH RADIAL ENDOBRONCHIAL ULTRASOUND: SHX6849

## 2020-09-11 HISTORY — PX: BRONCHIAL BRUSHINGS: SHX5108

## 2020-09-11 HISTORY — PX: FIDUCIAL MARKER PLACEMENT: SHX6858

## 2020-09-11 HISTORY — PX: BRONCHIAL WASHINGS: SHX5105

## 2020-09-11 HISTORY — PX: BRONCHIAL NEEDLE ASPIRATION BIOPSY: SHX5106

## 2020-09-11 HISTORY — PX: BRONCHIAL BIOPSY: SHX5109

## 2020-09-11 HISTORY — DX: Dyspnea, unspecified: R06.00

## 2020-09-11 HISTORY — PX: VIDEO BRONCHOSCOPY WITH ENDOBRONCHIAL NAVIGATION: SHX6175

## 2020-09-11 LAB — SARS CORONAVIRUS 2 BY RT PCR (HOSPITAL ORDER, PERFORMED IN ~~LOC~~ HOSPITAL LAB): SARS Coronavirus 2: POSITIVE — AB

## 2020-09-11 SURGERY — VIDEO BRONCHOSCOPY WITH ENDOBRONCHIAL NAVIGATION
Anesthesia: General | Laterality: Left

## 2020-09-11 MED ORDER — PROPOFOL 10 MG/ML IV BOLUS
INTRAVENOUS | Status: DC | PRN
  Administered 2020-09-11: 80 mg via INTRAVENOUS

## 2020-09-11 MED ORDER — SUGAMMADEX SODIUM 200 MG/2ML IV SOLN
INTRAVENOUS | Status: DC | PRN
  Administered 2020-09-11 (×2): 100 mg via INTRAVENOUS

## 2020-09-11 MED ORDER — EPHEDRINE SULFATE 50 MG/ML IJ SOLN
INTRAMUSCULAR | Status: DC | PRN
  Administered 2020-09-11: 5 mg via INTRAVENOUS

## 2020-09-11 MED ORDER — CHLORHEXIDINE GLUCONATE 0.12 % MT SOLN
OROMUCOSAL | Status: AC
  Administered 2020-09-11: 15 mL
  Filled 2020-09-11: qty 15

## 2020-09-11 MED ORDER — LACTATED RINGERS IV SOLN: INTRAVENOUS | Status: DC

## 2020-09-11 MED ORDER — PHENYLEPHRINE 40 MCG/ML (10ML) SYRINGE FOR IV PUSH (FOR BLOOD PRESSURE SUPPORT)
PREFILLED_SYRINGE | INTRAVENOUS | Status: DC | PRN
  Administered 2020-09-11: 120 ug via INTRAVENOUS
  Administered 2020-09-11: 80 ug via INTRAVENOUS
  Administered 2020-09-11: 120 ug via INTRAVENOUS
  Administered 2020-09-11: 80 ug via INTRAVENOUS

## 2020-09-11 MED ORDER — ONDANSETRON HCL 4 MG/2ML IJ SOLN
INTRAMUSCULAR | Status: DC | PRN
  Administered 2020-09-11: 4 mg via INTRAVENOUS

## 2020-09-11 MED ORDER — PHENYLEPHRINE HCL-NACL 20-0.9 MG/250ML-% IV SOLN
INTRAVENOUS | Status: DC | PRN
  Administered 2020-09-11: 35 ug/min via INTRAVENOUS

## 2020-09-11 MED ORDER — LACTATED RINGERS IV SOLN: INTRAVENOUS | Status: DC | PRN

## 2020-09-11 MED ORDER — ROCURONIUM BROMIDE 10 MG/ML (PF) SYRINGE
PREFILLED_SYRINGE | INTRAVENOUS | Status: DC | PRN
  Administered 2020-09-11: 70 mg via INTRAVENOUS

## 2020-09-11 MED ORDER — LIDOCAINE 2% (20 MG/ML) 5 ML SYRINGE
INTRAMUSCULAR | Status: DC | PRN
  Administered 2020-09-11: 50 mg via INTRAVENOUS

## 2020-09-11 MED ORDER — FENTANYL CITRATE (PF) 250 MCG/5ML IJ SOLN
INTRAMUSCULAR | Status: DC | PRN
  Administered 2020-09-11: 50 ug via INTRAVENOUS

## 2020-09-11 MED ORDER — FENTANYL CITRATE (PF) 100 MCG/2ML IJ SOLN: 25.0000 ug | INTRAMUSCULAR | Status: DC | PRN

## 2020-09-11 SURGICAL SUPPLY — 47 items

## 2020-09-11 NOTE — Progress Notes (Signed)
Per note on 08/29/20, patient tested positive for covid 19 on 08/24/20.  Dr. Valeta Harms and Dr. Lanetta Inch aware and ok to proceed with procedure.

## 2020-09-11 NOTE — Transfer of Care (Signed)
Immediate Anesthesia Transfer of Care Note  Patient: Desiree [name]  Procedure(s) Performed: VIDEO BRONCHOSCOPY WITH ENDOBRONCHIAL NAVIGATION (Left) RADIAL ENDOBRONCHIAL ULTRASOUND BRONCHIAL BIOPSIES BRONCHIAL BRUSHINGS BRONCHIAL NEEDLE ASPIRATION BIOPSIES BRONCHIAL WASHINGS FIDUCIAL MARKER PLACEMENT  Patient Location: PACU  Anesthesia Type:General  Level of Consciousness: drowsy, patient cooperative and responds to stimulation  Airway & Oxygen Therapy: Patient Spontanous Breathing and Patient connected to face mask oxygen  Post-op Assessment: Report given to RN and Post -op Vital signs reviewed and stable  Post vital signs: Reviewed and stable  Last Vitals:  Vitals Value Taken Time  BP 143/64 09/11/20 1306  Temp    Pulse 102 09/11/20 1309  Resp 28 09/11/20 1309  SpO2 97 % 09/11/20 1309  Vitals shown include unvalidated device data.  Last Pain:  Vitals:   09/11/20 1306  TempSrc:   PainSc: Asleep         Complications: No notable events documented.

## 2020-09-11 NOTE — Anesthesia Postprocedure Evaluation (Signed)
Anesthesia Post Note  Patient: Taraoluwa Thakur  Procedure(s) Performed: VIDEO BRONCHOSCOPY WITH ENDOBRONCHIAL NAVIGATION (Left) RADIAL ENDOBRONCHIAL ULTRASOUND BRONCHIAL BIOPSIES BRONCHIAL BRUSHINGS BRONCHIAL NEEDLE ASPIRATION BIOPSIES BRONCHIAL WASHINGS FIDUCIAL MARKER PLACEMENT     Patient location during evaluation: PACU Anesthesia Type: General Level of consciousness: awake and alert Pain management: pain level controlled Vital Signs Assessment: post-procedure vital signs reviewed and stable Respiratory status: spontaneous breathing, nonlabored ventilation, respiratory function stable and patient connected to nasal cannula oxygen Cardiovascular status: blood pressure returned to baseline and stable Postop Assessment: no apparent nausea or vomiting Anesthetic complications: no   No notable events documented.  Last Vitals:  Vitals:   09/11/20 1337 09/11/20 1423  BP: (!) 152/110 120/87  Pulse: 99 93  Resp: 18 20  Temp:  36.7 C  SpO2: 99% 99%    Last Pain:  Vitals:   09/11/20 1423  TempSrc:   PainSc: 0-No pain                 Moesha Sarchet L Alfonsa Vaile

## 2020-09-11 NOTE — Interval H&P Note (Signed)
History and Physical Interval Note:  09/11/2020 9:54 AM  Desiree [name]  has presented today for surgery, with the diagnosis of lung nodule.  The various methods of treatment have been discussed with the patient and family. After consideration of risks, benefits and other options for treatment, the patient has consented to  Procedure(s) with comments: [place] (Left) - ION as a surgical intervention.  The patient's history has been reviewed, patient examined, no change in status, stable for surgery.  I have reviewed the patient's chart and labs.  Questions were answered to the patient's satisfaction.     [place]

## 2020-09-11 NOTE — Discharge Instructions (Signed)
Flexible Bronchoscopy, Care After This sheet gives you information about how to care for yourself after your test. Your doctor may also give you more specific instructions. If you have problems or questions, contact your doctor. Follow these instructions at home: Eating and drinking Do not eat or drink anything (not even water) for 2 hours after your test, or until your numbing medicine (local anesthetic) wears off. When your numbness is gone and your cough and gag reflexes have come back, you may: Eat only soft foods. Slowly drink liquids. The day after the test, go back to your normal diet. Driving Do not drive for 24 hours if you were given a medicine to help you relax (sedative). Do not drive or use heavy machinery while taking prescription pain medicine. General instructions  Take over-the-counter and prescription medicines only as told by your doctor. Return to your normal activities as told. Ask what activities are safe for you. Do not use any products that have nicotine or tobacco in them. This includes cigarettes and e-cigarettes. If you need help quitting, ask your doctor. Keep all follow-up visits as told by your doctor. This is important. It is very important if you had a tissue sample (biopsy) taken. Get help right away if: You have shortness of breath that gets worse. You get light-headed. You feel like you are going to pass out (faint). You have chest pain. You cough up: More than a little blood. More blood than before. Summary Do not eat or drink anything (not even water) for 2 hours after your test, or until your numbing medicine wears off. Do not use cigarettes. Do not use e-cigarettes. Get help right away if you have chest pain.  This information is not intended to replace advice given to you by your health care provider. Make sure you discuss any questions you have with your health care provider. Document Released: 11/17/2008 Document Revised: 01/02/2017 Document  Reviewed: 02/08/2016 Elsevier Patient Education  2020 [employer].

## 2020-09-11 NOTE — Anesthesia Procedure Notes (Signed)
Procedure Name: Intubation Date/Time: 09/11/2020 3:00 PM Performed by: Glynda Jaeger, CRNA Pre-anesthesia Checklist: Patient identified, Patient being monitored, Timeout performed, Emergency Drugs available and Suction available Patient Re-evaluated:Patient Re-evaluated prior to induction Oxygen Delivery Method: Circle System Utilized Preoxygenation: Pre-oxygenation with 100% oxygen Induction Type: IV induction Ventilation: Mask ventilation without difficulty Laryngoscope Size: Mac and 4 Grade View: Grade II Tube type: Oral Tube size: 8.5 mm Number of attempts: 1 Airway Equipment and Method: Stylet Placement Confirmation: ETT inserted through vocal cords under direct vision, positive ETCO2 and breath sounds checked- equal and bilateral Secured at: 22 cm Tube secured with: Tape Dental Injury: Teeth and Oropharynx as per pre-operative assessment

## 2020-09-11 NOTE — Op Note (Addendum)
Video Bronchoscopy with Robotic Assisted Bronchoscopic Navigation   Date of Operation: 09/11/2020  Pre-op Diagnosis: Multiple lung nodules, left lower lobe  Post-op Diagnosis: Multiple lung nodules, left lower lobe  Surgeon: Garner Nash, DO  Assistants: None   Anesthesia: General endotracheal anesthesia  Operation: Flexible video fiberoptic bronchoscopy with robotic assistance and biopsies.  Estimated Blood Loss: Minimal  Complications: None  Indications and History: Desiree [name] is a 75 y.o. female with history of multiple pulmonary nodules, left lower lobe, abnormal PET scan, history of colon cancer. The risks, benefits, complications, treatment options and expected outcomes were discussed with the patient.  The possibilities of pneumothorax, pneumonia, reaction to medication, pulmonary aspiration, perforation of a viscus, bleeding, failure to diagnose a condition and creating a complication requiring transfusion or operation were discussed with the patient who freely signed the consent.    Description of Procedure: The patient was seen in the Preoperative Area, was examined and was deemed appropriate to proceed.  The patient was taken to [facility] endoscopy room 3, identified as Desiree [name] and the procedure verified as Flexible Video Fiberoptic Bronchoscopy.  A Time Out was held and the above information confirmed.   Prior to the date of the procedure a high-resolution CT scan of the chest was performed. Utilizing ION software program a virtual tracheobronchial tree was generated to allow the creation of distinct navigation pathways to the patient's parenchymal abnormalities. After being taken to the operating room general anesthesia was initiated and the patient  was orally intubated. The video fiberoptic bronchoscope was introduced via the endotracheal tube and a general inspection was performed which showed normal right and left lung anatomy, aspiration of the bilateral mainstems  was completed to remove any remaining secretions. Robotic catheter inserted into patient's endotracheal tube.   Target #1 left lower lobe peripheral: The distinct navigation pathways prepared prior to this procedure were then utilized to navigate to patient's lesion identified on CT scan. The robotic catheter was secured into place and the vision probe was withdrawn.  Lesion location was approximated using fluoroscopy and radial endobronchial ultrasound for peripheral targeting. Under fluoroscopic guidance transbronchial needle brushings, transbronchial needle biopsies, and transbronchial forceps biopsies were performed to be sent for cytology and pathology.  Following tissue sampling a single fiducial was placed in proximity of the lesion under fluoroscopic guidance. A bronchioalveolar lavage was performed in the left lower lobe and sent for cytology.  Target #2 left lower lobe proximal: The distinct navigation pathways prepared prior to this procedure were then utilized to navigate to patient's lesion identified on CT scan. The robotic catheter was secured into place and the vision probe was withdrawn.  Lesion location was approximated using fluoroscopy and radial endobronchial ultrasound for peripheral targeting. Under fluoroscopic guidance transbronchial needle brushings, transbronchial needle biopsies, and transbronchial forceps biopsies were performed to be sent for cytology and pathology. A bronchioalveolar lavage was performed in the left lower lobe and sent for cytology.  At the end of the procedure a general airway inspection was performed and there was no evidence of active bleeding. The bronchoscope was removed.  The patient tolerated the procedure well. There was no significant blood loss and there were no obvious complications. A post-procedural chest x-ray is pending.  Samples Target #1: 1. Transbronchial needle brushings from left lower lobe distal 2. Transbronchial Wang needle biopsies  from left lower lobe distal 3. Transbronchial forceps biopsies from left lower lobe distal 4. Bronchoalveolar lavage from left lower lobe distal  Samples Target #  2: 1. Transbronchial needle brushings from left lower lobe proximal 2. Transbronchial Wang needle biopsies from left lower lobe proximal 3. Transbronchial forceps biopsies from left lower lobe proximal 4. Bronchoalveolar lavage from left lower lobe proximal  Plans:  The patient will be discharged from the PACU to home when recovered from anesthesia and after chest x-ray is reviewed. We will review the cytology, pathology and microbiology results with the patient when they become available. Outpatient followup will be with Garner Nash, DO.  Garner Nash, DO [facility] 09/11/2020 12:50 PM

## 2020-09-11 NOTE — Anesthesia Preprocedure Evaluation (Addendum)
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 on medications and Pt. on home beta blockers + CAD and + Peripheral Vascular Disease  Normal cardiovascular exam+ dysrhythmias (on eliquis) Atrial Fibrillation  Rhythm:Regular Rate:Normal  TTE 2020 1. Left ventricular ejection fraction, by visual estimation, is 45 to  50%. The left ventricle has normal function. Normal left ventricular size.  There is borderline left ventricular hypertrophy.  2. Left ventricular diastolic Doppler parameters are consistent with  impaired relaxation pattern of LV diastolic filling.  3. Global right ventricle has normal systolic function.The right  ventricular size is normal. No increase in right ventricular wall  thickness.  4. Left atrial size was normal.  5. Right atrial size was normal.  6. The mitral valve is normal in structure. No evidence of mitral valve  regurgitation. No evidence of mitral stenosis.  7. The tricuspid valve is normal in structure. Tricuspid valve  regurgitation was not visualized by color flow Doppler.  8. The aortic valve is normal in structure. Aortic valve regurgitation  was not visualized by color flow Doppler. Structurally normal aortic  valve, with no evidence of sclerosis or stenosis.  9. The pulmonic valve was normal in structure. Pulmonic valve  regurgitation is not visualized by color flow Doppler.  10. The inferior vena cava is normal in size with  greater than 50%  respiratory variability, suggesting right atrial pressure of 3 mmHg.   LHC 2020 Mid LAD lesion is 10% stenosed. The left ventricular systolic function is normal. LV end diastolic pressure is normal. The left ventricular ejection fraction is 55-65% by visual estimate. There is no aortic valve stenosis.   No significant CAD.  Continue preventive therapy    Neuro/Psych PSYCHIATRIC DISORDERS Depression negative neurological ROS     GI/Hepatic GERD  Medicated and Controlled,  Endo/Other  negative endocrine ROS  Renal/GU Renal InsufficiencyRenal disease  negative genitourinary   Musculoskeletal  (+) Arthritis , Rheumatoid disorders,    Abdominal   Peds  Hematology negative hematology ROS (+)   Anesthesia Other Findings   Reproductive/Obstetrics                           Anesthesia Physical Anesthesia Plan  ASA: 3  Anesthesia Plan: General   Post-op Pain Management:    Induction: Intravenous  PONV Risk Score and Plan: Dexamethasone, Ondansetron and Treatment may vary due to age or medical condition  Airway Management Planned: Oral ETT  Additional Equipment:   Intra-op Plan:   Post-operative Plan: Extubation in OR  Informed Consent: I have reviewed the patients History and Physical, chart, labs and discussed the procedure including the risks, benefits and alternatives for the proposed anesthesia with the patient or authorized representative who has indicated his/her understanding and acceptance.     Dental advisory given  Plan Discussed with: CRNA  Anesthesia Plan Comments:         Anesthesia Quick Evaluation

## 2020-09-12 ENCOUNTER — Encounter (HOSPITAL_COMMUNITY): Payer: Self-pay | Admitting: Pulmonary Disease

## 2020-09-12 LAB — CYTOLOGY - NON PAP

## 2020-09-14 ENCOUNTER — Telehealth: Payer: Self-pay | Admitting: Emergency Medicine

## 2020-09-14 LAB — CYTOLOGY - NON PAP

## 2020-09-14 NOTE — Telephone Encounter (Signed)
Called patient, her daughter. No answer. Left VM for daughter. Will have to call them back with cytology results >> both LLL targets with squamous cell CA.   Was able to reach [place] by phone. Discussed results with her. Pt has an OV with Dr Bobby Rumpf with Oncology coming up soon.

## 2020-09-17 NOTE — Progress Notes (Signed)
[facility]  [address] [place],  [place]  [address] [phone]  Clinic Day:  09/24/2020  Referring physician: Harvel Quale, NP  This document serves as a record of services personally performed by Marice Potter, MD. It was created on their behalf by Curry,Lauren E, a trained medical scribe. The creation of this record is based on the scribe's personal observations and the provider's statements to them.  HISTORY OF PRESENT ILLNESS:  The patient is a 75 y.o. female  who I recently began seeing for stage IIIA (T2 N1 M0) colon cancer, status post a right hemicolectomy in June 2022.  Due to her poor baseline health, adjuvant chemotherapy was not entertained.  She comes in today to go her over the biopsy results of her left lower lobe lung mass, for which recent scans had shown a growth in size.   She has been doing well since her lung biopsy.  As it pertains to her colon cancer, she denies having any GI symptoms which concern her for early disease recurrence  PHYSICAL EXAM:  Blood pressure (!) 147/72, pulse 100, temperature 98.2 F (36.8 C), resp. rate 16, height 5\' 1"  (1.549 m), weight 109 lb (49.4 kg), SpO2 96 %. Wt Readings from Last 3 Encounters:  09/24/20 109 lb (49.4 kg)  09/11/20 120 lb (54.4 kg)  08/23/20 117 lb 8 oz (53.3 kg)   Body mass index is 20.6 kg/m. Performance status (ECOG): 3 - Symptomatic, >50% confined to bed Physical Exam Constitutional:      Appearance: Normal appearance. She is ill-appearing.     Comments: Chronically-ill appearing woman in a wheelchair, wearing oxygen per nasal canula  HENT:     Mouth/Throat:     Mouth: Mucous membranes are moist.     Pharynx: Oropharynx is clear. No oropharyngeal exudate or posterior oropharyngeal erythema.  Cardiovascular:     Rate and Rhythm: Normal rate and regular rhythm.     Heart sounds: No murmur heard.   No friction rub. No gallop.  Pulmonary:     Effort: Pulmonary  effort is normal. No respiratory distress.     Breath sounds: Normal breath sounds. No wheezing, rhonchi or rales.     Comments: Diffusely decreased breath sounds bilaterally Abdominal:     General: Bowel sounds are normal. There is no distension.     Palpations: Abdomen is soft. There is no mass.     Tenderness: There is no abdominal tenderness.  Musculoskeletal:        General: No swelling.     Right lower leg: No edema.     Left lower leg: No edema.  Lymphadenopathy:     Cervical: No cervical adenopathy.     Upper Body:     Right upper body: No supraclavicular or axillary adenopathy.     Left upper body: No supraclavicular or axillary adenopathy.     Lower Body: No right inguinal adenopathy. No left inguinal adenopathy.  Skin:    General: Skin is warm.     Coloration: Skin is not jaundiced.     Findings: No lesion or rash.  Neurological:     General: No focal deficit present.     Mental Status: She is alert and oriented to person, place, and time. Mental status is at baseline.     Cranial Nerves: Cranial nerves are intact.  Psychiatric:        Mood and Affect: Mood normal.        Behavior:  Behavior normal.        Thought Content: Thought content normal.   PATHOLOGY:  The results from her left lower lobe lung biopsy revealed the following: FINAL MICROSCOPIC DIAGNOSIS:  A. LUNG, LLL, BRUSHING:  - Atypical cells present   B. 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 LUNG, LLL, TARGET #2, BRUSHING:  - Atypical cells present   E. LUNG, LLL, TARGET #2, NEEDLE 7 BITES:  - Malignant cells consistent with non-small cell carcinoma  - See Comment   ADDENDUM:  Immunohistochemistry shows the malignant cells are positive  with cytokeratin 5/6 and p63 and are negative with cytokeratin 7 and  TTF-1.  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  There is likely sufficient cellularity for additional  testing.   LABS:   CBC Latest Ref Rng & Units 09/24/2020 08/23/2020 08/05/2020  WBC - 6.5 4.4 9.2  Hemoglobin 12.0 - 16.0 8.4(A) 9.3(A) 9.1(L)  Hematocrit 36 - 46 26(A) 29(A) 28.6(L)  Platelets 150 - 399 263 228 275   CMP Latest Ref Rng & Units 09/24/2020 08/23/2020 08/05/2020  Glucose 70 - 99 mg/dL - - 88  BUN 4 - 21 13 8  7(L)  Creatinine 0.5 - 1.1 0.7 0.5 0.56  Sodium 137 - 147 132(A) 129(A) 132(L)  Potassium 3.4 - 5.3 3.7 3.7 3.8  Chloride 99 - 108 99 97(A) 98  CO2 13 - 22 28(A) 28(A) 27  Calcium 8.7 - 10.7 8.7 8.7 8.2(L)  Total Protein 6.5 - 8.1 g/dL - - -  Total Bilirubin 0.3 - 1.2 mg/dL - - -  Alkaline Phos 25 - 125 105 84 -  AST 13 - 35 34 41(A) -  ALT 7 - 35 15 23 -    CEA  Date Value Ref Range Status  08/23/2020 9.4 (H) 0.0 - 4.7 ng/mL Final    Comment:    (NOTE)                             Nonsmokers          <3.9                             Smokers             <5.6 [employer] Electrochemiluminescence Immunoassay (ECLIA) Values obtained with different assay methods or kits cannot be used interchangeably.  Results cannot be interpreted as absolute evidence of the presence or absence of malignant disease. Performed At: [facility] [place], [place] [id number] Rush Farmer MD TG:[id number]     ASSESSMENT & PLAN:  A 75 y.o. 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  Based upon her poor health, I do believe the best way to treat this lesion is per stereotactic radiation.  I will refer her to radiation oncology in [place] to have this done.  With respect to her stage IIIA (T2 N1 M0) colon cancer, a recent PET showed a hypermetabolic focus deep in her mesentery.  This area could represent new disease or postsurgical changes.  For now her CEA level will continue to be followed.  It was recently elevated, which could have been due to her left lung mass.  If  her CEA level remains  elevated after her stereotactic radiation to her lung cancer, then the hypermetabolic focus will need to be assessed.  I will see this patient back in 2 months for repeat clinical assessment.  The patient understands all the plans discussed today and is in agreement with them.   I, Rita Ohara, am acting as scribe for Marice Potter, MD    I have reviewed this report as typed by the medical scribe, and it is complete and accurate.  Dequincy Macarthur Critchley, MD

## 2020-09-20 ENCOUNTER — Other Ambulatory Visit: Payer: Self-pay | Admitting: *Deleted

## 2020-09-20 LAB — CYTOLOGY - NON PAP

## 2020-09-20 NOTE — Progress Notes (Signed)
The proposed treatment discussed in cancer conference is for discussion purpose only and is not a binding recommendation. The patient was not physically examined nor present for their treatment options. Therefore, final treatment plans cannot be decided.  ?

## 2020-09-24 ENCOUNTER — Other Ambulatory Visit: Payer: Self-pay | Admitting: Oncology

## 2020-09-24 ENCOUNTER — Encounter: Payer: Self-pay | Admitting: Oncology

## 2020-09-24 ENCOUNTER — Inpatient Hospital Stay: Payer: Medicare PPO

## 2020-09-24 ENCOUNTER — Other Ambulatory Visit: Payer: Self-pay

## 2020-09-24 ENCOUNTER — Inpatient Hospital Stay: Payer: Medicare PPO | Attending: Oncology | Admitting: Oncology

## 2020-09-24 DIAGNOSIS — C3430 Malignant neoplasm of lower lobe, unspecified bronchus or lung: Secondary | ICD-10-CM | POA: Insufficient documentation

## 2020-09-24 DIAGNOSIS — C182 Malignant neoplasm of ascending colon: Secondary | ICD-10-CM | POA: Diagnosis present

## 2020-09-24 DIAGNOSIS — C3432 Malignant neoplasm of lower lobe, left bronchus or lung: Secondary | ICD-10-CM | POA: Diagnosis not present

## 2020-09-24 HISTORY — DX: Malignant neoplasm of lower lobe, unspecified bronchus or lung: C34.30

## 2020-09-24 LAB — HEPATIC FUNCTION PANEL
ALT: 15 (ref 7–35)
AST: 34 (ref 13–35)
Alkaline Phosphatase: 105 (ref 25–125)
Bilirubin, Total: 0.3

## 2020-09-24 LAB — CBC: RBC: 2.78 — AB (ref 3.87–5.11)

## 2020-09-24 LAB — BASIC METABOLIC PANEL
BUN: 13 (ref 4–21)
CO2: 28 — AB (ref 13–22)
Chloride: 99 (ref 99–108)
Creatinine: 0.7 (ref 0.5–1.1)
Glucose: 118
Potassium: 3.7 (ref 3.4–5.3)
Sodium: 132 — AB (ref 137–147)

## 2020-09-24 LAB — CBC AND DIFFERENTIAL
HCT: 26 — AB (ref 36–46)
Hemoglobin: 8.4 — AB (ref 12.0–16.0)
Neutrophils Absolute: 5.46
Platelets: 263 (ref 150–399)
WBC: 6.5

## 2020-09-24 LAB — COMPREHENSIVE METABOLIC PANEL
Albumin: 3.5 (ref 3.5–5.0)
Calcium: 8.7 (ref 8.7–10.7)

## 2020-09-25 LAB — CEA: CEA: 7 ng/mL — ABNORMAL HIGH (ref 0.0–4.7)

## 2020-09-26 ENCOUNTER — Inpatient Hospital Stay: Payer: Medicare PPO | Admitting: Pulmonary Disease

## 2020-10-02 ENCOUNTER — Telehealth: Payer: Self-pay | Admitting: Oncology

## 2020-10-02 ENCOUNTER — Other Ambulatory Visit: Payer: Self-pay

## 2020-10-02 MED ORDER — AMLODIPINE BESYLATE 5 MG PO TABS: 5.0000 mg | ORAL_TABLET | Freq: Every day | ORAL | 0 refills | Status: DC

## 2020-10-02 NOTE — Telephone Encounter (Signed)
Left Message w/Shirley at [facility] to return my call

## 2020-10-02 NOTE — Telephone Encounter (Signed)
Refill of Amlodipine 5 mg sent to [facility].

## 2020-10-03 ENCOUNTER — Telehealth: Payer: Self-pay | Admitting: Oncology

## 2020-10-03 ENCOUNTER — Encounter: Payer: Self-pay | Admitting: Oncology

## 2020-10-03 NOTE — Telephone Encounter (Signed)
Left couple messages w/[facility] to return call reguarding Dr Bobby Rumpf' requests for patient to be referred for Stereotatic Radiation

## 2020-10-04 ENCOUNTER — Other Ambulatory Visit: Payer: Self-pay

## 2020-10-04 DIAGNOSIS — C182 Malignant neoplasm of ascending colon: Secondary | ICD-10-CM | POA: Insufficient documentation

## 2020-10-04 DIAGNOSIS — R0600 Dyspnea, unspecified: Secondary | ICD-10-CM | POA: Insufficient documentation

## 2020-10-04 DIAGNOSIS — I480 Paroxysmal atrial fibrillation: Secondary | ICD-10-CM | POA: Insufficient documentation

## 2020-10-04 DIAGNOSIS — I482 Chronic atrial fibrillation, unspecified: Secondary | ICD-10-CM | POA: Insufficient documentation

## 2020-10-04 HISTORY — DX: Chronic atrial fibrillation, unspecified: I48.20

## 2020-10-05 ENCOUNTER — Other Ambulatory Visit: Payer: Self-pay

## 2020-10-05 MED ORDER — ATORVASTATIN CALCIUM 20 MG PO TABS: 20.0000 mg | ORAL_TABLET | Freq: Every day | ORAL | 0 refills | Status: DC

## 2020-10-05 NOTE — Telephone Encounter (Signed)
Refill of Atorvastatin 20 mg sent to [facility].

## 2020-10-10 ENCOUNTER — Encounter: Payer: Self-pay | Admitting: Cardiology

## 2020-10-10 ENCOUNTER — Ambulatory Visit: Payer: Medicare PPO | Admitting: Cardiology

## 2020-10-10 ENCOUNTER — Other Ambulatory Visit: Payer: Self-pay

## 2020-10-10 VITALS — BP 142/66 | HR 96 | Ht 60.0 in | Wt 111.0 lb

## 2020-10-10 DIAGNOSIS — I480 Paroxysmal atrial fibrillation: Secondary | ICD-10-CM | POA: Diagnosis not present

## 2020-10-10 DIAGNOSIS — E782 Mixed hyperlipidemia: Secondary | ICD-10-CM | POA: Diagnosis not present

## 2020-10-10 DIAGNOSIS — I10 Essential (primary) hypertension: Secondary | ICD-10-CM | POA: Diagnosis not present

## 2020-10-10 DIAGNOSIS — J431 Panlobular emphysema: Secondary | ICD-10-CM

## 2020-10-10 DIAGNOSIS — I2510 Atherosclerotic heart disease of native coronary artery without angina pectoris: Secondary | ICD-10-CM | POA: Diagnosis not present

## 2020-10-10 NOTE — Progress Notes (Signed)
Cardiology Office Note:    Date:  10/10/2020   ID:  Desiree [name], DOB 1946/01/03, MRN [id number]  PCP:  Harvel Quale, NP  Cardiologist:  Jenean Lindau, MD   Referring MD: Harvel Quale, NP    ASSESSMENT:    1. AF (paroxysmal atrial fibrillation) ([place])   2. Essential hypertension   3. Mild CAD   4. Mixed hyperlipidemia   5. Paroxysmal atrial fibrillation (HCC)   6. Panlobular emphysema (HCC)    PLAN:    In order of problems listed above:  Primary prevention stressed with the patient.  Importance of compliance with diet medication stressed and she vocalized understanding. Paroxysmal atrial fibrillation:I discussed with the patient atrial fibrillation, disease process. Management and therapy including rate and rhythm control, anticoagulation benefits and potential risks were discussed extensively with the patient. Patient had multiple questions which were answered to patient's satisfaction.  Patient is on low-dose anticoagulation because of GI bleeding issues.  I will leave it to the discretion of the gastroenterologist and primary care provider in view of the fact that her hemoglobin has subsequently dropped about a gram based on the last blood work. History of pulmonary embolism: On anticoagulation as mentioned above History of COPD on oxygen: Stable and managed by primary care. Patient will be seen in follow-up appointment in 6 months or earlier if the patient has any concerns    Medication Adjustments/Labs and Tests Ordered: Current medicines are reviewed at length with the patient today.  Concerns regarding medicines are outlined above.  No orders of the defined types were placed in this encounter.  No orders of the defined types were placed in this encounter.    No chief complaint on file.    History of Present Illness:    Desiree [name] is a 75 y.o. female.  Patient has past medical history of paroxysmal atrial fibrillation, pulmonary embolism, COPD on  oxygen and essential hypertension.  She denies any problems at this time.  She takes care of activities of daily living.  She leads a sedentary lifestyle.  She has had significant issues with GI bleeding and therefore after an initial evaluation she has been cleared to use low-dose Eliquis which she is at this point.  She has seen my partner in the past and wanted to switch providers therefore I am seeing her today.  At the time of my evaluation, the patient is alert awake oriented and in no distress.  Past Medical History:  Diagnosis Date   Abnormal nuclear stress test    AF (paroxysmal atrial fibrillation) (HCC)    Age-related osteoporosis without current pathological fracture 05/16/2016   Alcoholism ([place]) 10/21/2018   Formatting of this note might be different from the original. 2 glasses a day.   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 Wrist surgery   Benign paroxysmal positional vertigo due to bilateral vestibular disorder 02/09/2020   Cancer of ascending colon [facility])    Carcinoma of ascending colon (HCC)    Cat allergies    Centrilobular emphysema ([place]) 05/16/2016   CKD (chronic kidney disease) stage 3, GFR 30-59 ml/min ([place]) 01/04/2018   COPD (chronic obstructive pulmonary disease) ([place])    Cysts of both ovaries 11/24/2019   Formatting of this note might be different from the original. On CT 11/2019. Referred to GYN.   Dyspnea    Early satiety 04/14/2019   Essential hypertension 08/22/2010  Formatting of this note might be different from the original. Formatting of this note might be different from the original. D/C ACE 08/22/2010 due to concerns with freq aecopd/ pseudoasthma > resolved  Last Assessment & Plan:  Formatting of this note might be different from the original. 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 Possible???? See CT comments 11/2019.   Paroxysmal atrial fibrillation ([place]) 04/14/2019   Formatting of this note might be different from the original. [facility].   Prediabetes 07/14/2017   Pulmonary embolism ([place]) 2017   Rheumatoid arthritis ([place]) 09/20/2010   Dr Trudie Reed in [place].   Rheumatoid arthritis(714.0)    Dr Tobie Lords follows   Schatzki's ring    Senile purpura ([place]) 01/04/2018   Skin rash 07/14/2017   Bx'd Allergic. Likely meds.   Status post thoracentesis    Symptomatic anemia 07/25/2020   Vitamin B12 deficiency 07/14/2017    Past Surgical History:  Procedure Laterality Date   BIOPSY  07/27/2020   Procedure: BIOPSY;  Surgeon: Gatha Mayer, MD;  Location: [place];  Service: Endoscopy;;   BRONCHIAL BIOPSY  09/11/2020   Procedure: BRONCHIAL BIOPSIES;  Surgeon: Garner Nash, DO;  Location: [place] [facility];  Service: Pulmonary;;   BRONCHIAL BRUSHINGS  09/11/2020   Procedure: BRONCHIAL BRUSHINGS;  Surgeon: Garner Nash, DO;  Location: [place] [facility];  Service: Pulmonary;;   BRONCHIAL NEEDLE ASPIRATION BIOPSY  09/11/2020   Procedure: BRONCHIAL NEEDLE ASPIRATION BIOPSIES;  Surgeon: Garner Nash, DO;  Location: [place];  Service: Pulmonary;;    BRONCHIAL WASHINGS  09/11/2020   Procedure: BRONCHIAL WASHINGS;  Surgeon: Garner Nash, DO;  Location: [place];  Service: Pulmonary;;   COLONOSCOPY N/A 07/27/2020   Procedure: COLONOSCOPY;  Surgeon: Gatha Mayer, MD;  Location: [place];  Service: Endoscopy;  Laterality: N/A;   CYSTECTOMY Right    breast   ESOPHAGOGASTRODUODENOSCOPY (EGD) WITH PROPOFOL N/A 07/27/2020   Procedure: ESOPHAGOGASTRODUODENOSCOPY (EGD) WITH PROPOFOL;  Surgeon: Gatha Mayer, MD;  Location: [place];  Service: Endoscopy;  Laterality: N/A;   FIDUCIAL MARKER PLACEMENT  09/11/2020   Procedure: FIDUCIAL MARKER PLACEMENT;  Surgeon: Garner Nash, DO;  Location: [place] [facility];  Service: Pulmonary;;   LAPAROSCOPIC RIGHT HEMI COLECTOMY Right 07/31/2020   Procedure: LAPAROSCOPIC ASSISSTED  RIGHT HEMI COLECTOMY;  Surgeon: Donnie Mesa, MD;  Location: [place];  Service: General;  Laterality: Right;   LEFT HEART CATH AND CORONARY ANGIOGRAPHY N/A 01/05/2019   Procedure: LEFT HEART CATH AND CORONARY ANGIOGRAPHY;  Surgeon: Jettie Booze, MD;  Location: [place] [facility];  Service: Cardiovascular;  Laterality: N/A;   SUBMUCOSAL TATTOO INJECTION  07/27/2020  Procedure: SUBMUCOSAL TATTOO INJECTION;  Surgeon: Gatha Mayer, MD;  Location: [place];  Service: Endoscopy;;   VIDEO BRONCHOSCOPY WITH ENDOBRONCHIAL NAVIGATION Left 09/11/2020   Procedure: VIDEO BRONCHOSCOPY WITH ENDOBRONCHIAL NAVIGATION;  Surgeon: Garner Nash, DO;  Location: [place];  Service: Pulmonary;  Laterality: Left;  ION   VIDEO BRONCHOSCOPY WITH RADIAL ENDOBRONCHIAL ULTRASOUND  09/11/2020   Procedure: RADIAL ENDOBRONCHIAL ULTRASOUND;  Surgeon: Garner Nash, DO;  Location: [place] [facility];  Service: Pulmonary;;    Current Medications: Current Meds  Medication Sig   acetaminophen (TYLENOL) 500 MG tablet Take 500 mg by mouth every 6 (six) hours as needed for moderate pain or headache.   albuterol (VENTOLIN HFA) 108 (90 Base) MCG/ACT  inhaler Inhale 2 puffs into the lungs every 4 (four) hours as needed for shortness of breath (shortness of breath (related to COPD)).   amLODipine (NORVASC) 5 MG tablet Take 1 tablet (5 mg total) by mouth daily. Patient needs appointment for further refills. 1 st attempt   apixaban (ELIQUIS) 2.5 MG TABS tablet Take 1 tablet (2.5 mg total) by mouth 2 (two) times daily.   atorvastatin (LIPITOR) 20 MG tablet Take 1 tablet (20 mg total) by mouth daily. Patient needs to keep appointment for further refills. 1 st attempt   Budeson-Glycopyrrol-Formoterol (BREZTRI AEROSPHERE) 160-9-4.8 MCG/ACT AERO Inhale 2 puffs into the lungs 2 (two) times daily.   carvedilol (COREG) 3.125 MG tablet Take 1 tablet (3.125 mg total) by mouth 2 (two) times daily with a meal.   ferrous sulfate 325 (65 FE) MG EC tablet Take 325 mg by mouth daily with breakfast.   fexofenadine (ALLEGRA) 180 MG tablet Take 180 mg by mouth daily.   hydroxychloroquine (PLAQUENIL) 200 MG tablet Take 200 mg by mouth daily.   leflunomide (ARAVA) 20 MG tablet Take 20 mg by mouth daily.   loperamide (IMODIUM A-D) 2 MG tablet Take 4 mg by mouth as needed for diarrhea or loose stools (MAX 10 PILLS/24 HRS.).   montelukast (SINGULAIR) 10 MG tablet Take 1 tablet (10 mg total) by mouth at bedtime.   omeprazole (PRILOSEC) 20 MG capsule Take 1 capsule (20 mg total) by mouth daily.   ondansetron (ZOFRAN-ODT) 4 MG disintegrating tablet Take 1 tablet (4 mg total) by mouth every 8 (eight) hours as needed for nausea or vomiting.   predniSONE (DELTASONE) 5 MG tablet Take 5 mg by mouth daily with breakfast.   sertraline (ZOLOFT) 100 MG tablet Take 100 mg by mouth daily.    traMADol (ULTRAM) 50 MG tablet Take 50 mg by mouth every 6 (six) hours as needed for pain (pain).   valsartan (DIOVAN) 320 MG tablet Take 320 mg by mouth in the morning.   vitamin B-12 (CYANOCOBALAMIN) 1000 MCG tablet Take 1,000 mcg by mouth daily.      Allergies:   Other   Social History    Socioeconomic History   Marital status: Widowed    Spouse name: Not on file   Number of children: Not on file   Years of education: Not on file   Highest education level: Not on file  Occupational History   Occupation: retired  Tobacco Use   Smoking status: Former    Packs/day: 1.00    Years: 40.00    Pack years: 40.00    Types: Cigarettes    Quit date: 02/03/2001    Years since quitting: 19.6   Smokeless tobacco: Never  Vaping Use   Vaping Use: Never used  Substance and Sexual  Activity   Alcohol use: Yes    Alcohol/week: 2.0 standard drinks    Types: 2 Standard drinks or equivalent per week    Comment: 2 drinks daily   Drug use: No   Sexual activity: Not on file  Other Topics Concern   Not on file  Social History Narrative   Tobacco use, amount per day now: NONE   Past tobacco use, amount per day: 1 PACK PER DAY   How many years did you use tobacco:TOO MANY STOPPED IN 2003   Alcohol use (drinks per week): 14   Diet: GOOD   Do you drink/eat things with caffeine: YES   Marital status: WIDOW                What year were you married? 1967   Do you live in a house, apartment, assisted living, condo, trailer, etc.? HOUSE   Is it one or more stories? SPLIT LEVEL   How many persons live in your home? 1   Do you have pets in your home?( please list) NONE   Current or past profession: [place]   Do you exercise?  NO                                Type and how often?   Do you have a living will? YES    Do you have a DNR form?  YES                                 If not, do you want to discuss one?   Do you have signed POA/HPOA forms?        ??              If so, please bring to you appointment      [facility] (05/16/16):   Originally from [place], [place]. 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 No pets currently. No bird, mold, or hot tub exposure.    Social Determinants of Health   Financial  Resource Strain: Not on file  Food Insecurity: Not on file  Transportation Needs: Not on file  Physical Activity: Not on file  Stress: Not on file  Social Connections: Not on file     Family History: The patient's family history includes Breast cancer in her maternal aunt; Clotting disorder in her mother; Emphysema in her paternal grandmother and sister; Heart disease in her father and sister; Sleep apnea in her sister; Stroke in her mother.  ROS:   Please see the history of present illness.    All other systems reviewed and are negative.  EKGs/Labs/Other Studies Reviewed:    The following studies were reviewed today: I discussed my findings with the patient extensively.   Recent Labs: 07/16/2020: B Natriuretic Peptide 115.9 08/05/2020: Magnesium 1.6 09/24/2020: ALT 15; BUN 13; Creatinine 0.7; Hemoglobin 8.4; Platelets 263; Potassium 3.7; Sodium 132  Recent Lipid Panel No results found for: CHOL, TRIG, HDL, CHOLHDL, VLDL, LDLCALC, LDLDIRECT  Physical Exam:    VS:  BP (!) 142/66   Pulse 96   Ht 5' (1.524 m)   Wt 111 lb (50.3 kg)   SpO2 97%   BMI 21.68 kg/m     Wt Readings from Last 3 Encounters:  10/10/20 111 lb (50.3 kg)  09/24/20 109  lb (49.4 kg)  09/11/20 120 lb (54.4 kg)     GEN: Patient is in no acute distress HEENT: Normal NECK: No JVD; No carotid bruits LYMPHATICS: No lymphadenopathy CARDIAC: Hear sounds regular, 2/6 systolic murmur at the apex. RESPIRATORY:  Clear to auscultation without rales, wheezing or rhonchi  ABDOMEN: Soft, non-tender, non-distended MUSCULOSKELETAL:  No edema; No deformity  SKIN: Warm and dry NEUROLOGIC:  Alert and oriented x 3 PSYCHIATRIC:  Normal affect   Signed, Jenean Lindau, MD  10/10/2020 2:18 PM    [facility]

## 2020-10-10 NOTE — Patient Instructions (Signed)
Medication Instructions:  Your physician recommends that you continue on your current medications as directed. Please refer to the Current Medication list given to you today.  Eliquis 2.5 mg twice daily.  *If you need a refill on your cardiac medications before your next appointment, please call your pharmacy*   Lab Work: None ordered If you have labs (blood work) drawn today and your tests are completely normal, you will receive your results only by: [place] (if you have MyChart) OR A paper copy in the mail If you have any lab test that is abnormal or we need to change your treatment, we will call you to review the results.   Testing/Procedures: None ordered   Follow-Up: At [facility], you and your health needs are our priority.  As part of our continuing mission to provide you with exceptional heart care, we have created designated Provider Care Teams.  These Care Teams include your primary Cardiologist (physician) and Advanced Practice Providers (APPs -  Physician Assistants and Nurse Practitioners) who all work together to provide you with the care you need, when you need it.  We recommend signing up for the patient portal called "MyChart".  Sign up information is provided on this After Visit Summary.  MyChart is used to connect with patients for Virtual Visits (Telemedicine).  Patients are able to view lab/test results, encounter notes, upcoming appointments, etc.  Non-urgent messages can be sent to your provider as well.   To learn more about what you can do with MyChart, go to [email or web address].    Your next appointment:   6 month(s)  The format for your next appointment:   In Person  Provider:   Jyl Heinz, MD   Other Instructions NA

## 2020-10-21 ENCOUNTER — Other Ambulatory Visit: Payer: Self-pay | Admitting: *Deleted

## 2020-10-21 MED ORDER — MONTELUKAST SODIUM 10 MG PO TABS: 10.0000 mg | ORAL_TABLET | Freq: Every day | ORAL | 1 refills | Status: DC

## 2020-10-22 NOTE — Progress Notes (Addendum)
Location of tumor and Histology per Pathology Report: LLL lung, NSCLC  Biopsy:  09/11/2020 PATHOLOGY:  The results from her left lower lobe lung biopsy revealed the following: FINAL MICROSCOPIC DIAGNOSIS:  A. LUNG, LLL, BRUSHING:  - Atypical cells present   B. 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 LUNG, LLL, TARGET #2, BRUSHING:  - Atypical cells present   E. 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  There is likely sufficient cellularity for additional testing.   Past/Anticipated interventions by surgeon, if any:  lung biopsy,  Video Bronchoscopy with Robotic Assisted Bronchoscopic Navigation  Date of Operation: 09/11/2020 Surgeon: Garner Nash, DO  Past/Anticipated interventions by medical oncology, if any: no Chemotherapy at this time    Pain issues, if any:  no   SAFETY ISSUES: Prior radiation? no Pacemaker/ICD? no Possible current pregnancy? No, postmenopausal Is the patient on methotrexate? no  Current Complaints / other details:  left leg and hip pain after standing for long periods, fatigue     Vitals:   10/25/20 1428  BP: (!) 133/55  Pulse: 89  Resp: 20  Temp: 97.6 F (36.4 C)  SpO2: 97%  Weight: 108 lb 12.8 oz (49.4 kg)  Height: 5' (1.524 m)  PF: (!) 2 L/min

## 2020-10-24 ENCOUNTER — Ambulatory Visit: Payer: Medicare PPO | Admitting: Radiation Oncology

## 2020-10-24 ENCOUNTER — Ambulatory Visit: Payer: Medicare PPO

## 2020-10-24 NOTE — Progress Notes (Signed)
Radiation Oncology         [phone] ________________________________  Initial Outpatient Consultation  Name: Desiree [name] MRN: [id number]  Date: 10/25/2020  DOB: 04/18/1945  XB:MWUX, Sallee Lange, NP  Marice Potter, MD   REFERRING PHYSICIAN: Marice Potter, MD  DIAGNOSIS: The primary encounter diagnosis was Malignant neoplasm of lower lobe of left lung ([place]). A diagnosis of Cancer of ascending colon [facility]) was also pertinent to this visit.  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 female who is accompanied by caregiver from her assisted living facility in [place].  The patient daughter who lives in [place] is present by [employer]. she is seen as a courtesy of Dr. Bobby Rumpf for an opinion concerning radiation therapy as part of management for her recently diagnosed lung cancer ( following recent diagnosis of colon cancer). The patient initially presented on 07/25/20 to the ED with symptomatic anemia and concern for GI bleed. GI was consulted and patient underwent colonoscopy on 07/26/20 which showed a likely malignant mass in the ascending colon.  Biopsy performed on 07/27/20 showed atypia suspicious for adenocarcinoma. CT chest, abdomen and pelvis on 07/28/20 showed no concern for metastasis though did demonstrate progressive enlargement of a bilobed pulmonary nodule within the LLL; demonstrating slow but progressive enlargement since PET/CT on 09/09/2017. Mass was noted to likely represent an indolent primary bronchogenic neoplasm.   Accordingly, the patient underwent laparoscopic hemi-colectomy on 07/31/20 under the care of Dr. Georgette Dover. Pathology from the procedure revealed poorly differentiated adenocarcinoma with metastatic carcinoma involving 1/16 lymph nodes, and multiple tubular adenomas. She  was discharged on 08/05/20.  The patient then soon after followed up with Dr. Valeta Harms on 08/16/20 (patient has been followed by Dr. Valeta Harms since 2020 due to history of lung nodules). Per Dr. Valeta Harms, the progression of the bilobed pulmonary nodule is likely a separate malignancy in comparison to her recent colon cancer diagnosis. Dr. Valeta Harms accordingly recommended navigational bronchoscopy with left lower lobe biopsies.   Chest CT performed on 09/04/20 demonstrated the known bilobed lesion in the posterior left lower lobe to display a slight increase in size in the interval. Findings were overall noted to remain concerning for neoplasm. Collapse/consolidative disease in the right middle lobe and lingula was seen to be similar in appearance; with improved aeration in the posterior right lower lobe since 07/28/2020.   PET performed this same date further revealed the left lower lobe nodule to appear hypermetabolic and with even more pronounced metabolism, and associated with infrahilar lymph node vs proximal lung lesion. Findings were again noted as suspicious for metastatic disease from colorectal primary or primary bronchogenic neoplasm with ipsilateral hilar metastasis. Area of increased metabolic activity within the central mesentery, with marked hypermetabolic features was also seen; raising the question of residual/recurrent disease in the area of the anastomosis.   Bronchoscopy with LLL biopsies performed on 09/11/20 revealed: malignant cells consistent with non-small cell carcinoma.  2 targets were biopsied with confirming squamous cell carcinoma.  One was proximal LLL and the second was left lower lobe peripheral.  Patient tolerated the biopsies well without any complications.   PREVIOUS RADIATION THERAPY: No  PAST MEDICAL HISTORY:  Past Medical History:  Diagnosis Date   Abnormal nuclear stress test    AF (paroxysmal atrial fibrillation) (HCC)    Age-related osteoporosis  without current  pathological fracture 05/16/2016   Alcoholism ([place]) 10/21/2018   Formatting of this note might be different from the original. 2 glasses a day.   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 Wrist surgery   Benign paroxysmal positional vertigo due to bilateral vestibular disorder 02/09/2020   Cancer of ascending colon [facility])    Carcinoma of ascending colon (HCC)    Cat allergies    Centrilobular emphysema ([place]) 05/16/2016   CKD (chronic kidney disease) stage 3, GFR 30-59 ml/min ([employer]) 01/04/2018   COPD (chronic obstructive pulmonary disease) ([place])    Cysts of both ovaries 11/24/2019   Formatting of this note might be different from the original. On CT 11/2019. Referred to GYN.   Dyspnea    Early satiety 04/14/2019   Essential hypertension 08/22/2010   Formatting of this note might be different from the original. Formatting of this note might be different from the original. D/C ACE 08/22/2010 due to concerns with freq aecopd/ pseudoasthma > resolved  Last Assessment & Plan:  Formatting of this note might be different from the original. 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 Possible???? See CT comments 11/2019.   Paroxysmal atrial fibrillation ([place]) 04/14/2019   Formatting of this note might be different from the original. [facility].   Prediabetes 07/14/2017   Pulmonary embolism ([place]) 2017   Rheumatoid arthritis ([place]) 09/20/2010   Dr Trudie Reed in [place].   Rheumatoid arthritis(714.0)    Dr Tobie Lords follows   Schatzki's ring    Senile purpura ([place]) 01/04/2018   Skin rash 07/14/2017   Bx'd Allergic. Likely meds.   Status post thoracentesis    Symptomatic anemia 07/25/2020   Vitamin B12 deficiency 07/14/2017    PAST SURGICAL HISTORY: Past Surgical History:  Procedure Laterality Date   BIOPSY  07/27/2020   Procedure: BIOPSY;  Surgeon: Gatha Mayer, MD;  Location: [place];  Service: Endoscopy;;   BRONCHIAL BIOPSY  09/11/2020   Procedure: BRONCHIAL BIOPSIES;  Surgeon: Garner Nash, DO;  Location: [place] ENDOSCOPY;  Service: Pulmonary;;   BRONCHIAL BRUSHINGS  09/11/2020   Procedure: BRONCHIAL BRUSHINGS;  Surgeon: Garner Nash, DO;  Location: [place] ENDOSCOPY;  Service: Pulmonary;;   BRONCHIAL NEEDLE ASPIRATION BIOPSY  09/11/2020   Procedure: BRONCHIAL NEEDLE ASPIRATION BIOPSIES;  Surgeon: Garner Nash, DO;  Location: [place] ENDOSCOPY;  Service: Pulmonary;;   BRONCHIAL WASHINGS  09/11/2020   Procedure: BRONCHIAL WASHINGS;  Surgeon: Garner Nash, DO;  Location: [place];  Service: Pulmonary;;   COLONOSCOPY N/A 07/27/2020   Procedure: COLONOSCOPY;  Surgeon:  Gatha Mayer, MD;  Location: [facility] END[facility]COPY;  Service: Endoscopy;  Laterality: N/A;   CYSTECTOMY Right    breast   ESOPHAGOGASTRODUODENOSCOPY (EGD) WITH PROPOFOL N/A 07/27/2020   Procedure: ESOPHAGOGASTRODUODENOSCOPY (EGD) WITH PROPOFOL;  Surgeon: Gatha Mayer, MD;  Location: [place];  Service: Endoscopy;  Laterality: N/A;   FIDUCIAL MARKER PLACEMENT  09/11/2020   Procedure: FIDUCIAL MARKER PLACEMENT;  Surgeon: Garner Nash,  DO;  Location: [place] ENDOSCOPY;  Service: Pulmonary;;   LAPAROSCOPIC RIGHT HEMI COLECTOMY Right 07/31/2020   Procedure: LAPAROSCOPIC ASSISSTED  RIGHT HEMI COLECTOMY;  Surgeon: Donnie Mesa, MD;  Location: [place];  Service: General;  Laterality: Right;   LEFT HEART CATH AND CORONARY ANGIOGRAPHY N/A 01/05/2019   Procedure: LEFT HEART CATH AND CORONARY ANGIOGRAPHY;  Surgeon: Jettie Booze, MD;  Location: [place] CV LAB;  Service: Cardiovascular;  Laterality: N/A;   SUBMUCOSAL TATTOO INJECTION  07/27/2020   Procedure: SUBMUCOSAL TATTOO INJECTION;  Surgeon: Gatha Mayer, MD;  Location: [place];  Service: Endoscopy;;   VIDEO BRONCHOSCOPY WITH ENDOBRONCHIAL NAVIGATION Left 09/11/2020   Procedure: VIDEO BRONCHOSCOPY WITH ENDOBRONCHIAL NAVIGATION;  Surgeon: Garner Nash, DO;  Location: [place];  Service: Pulmonary;  Laterality: Left;  ION   VIDEO BRONCHOSCOPY WITH RADIAL ENDOBRONCHIAL ULTRASOUND  09/11/2020   Procedure: RADIAL ENDOBRONCHIAL ULTRASOUND;  Surgeon: Garner Nash, DO;  Location: [facility] ENDOSCOPY;  Service: Pulmonary;;    FAMILY HISTORY:  Family History  Problem Relation Age of Onset   Heart disease Father    Stroke Mother    Clotting disorder Mother    Heart disease Sister    Sleep apnea Sister    Emphysema Sister    Breast cancer Maternal Aunt    Emphysema Paternal Grandmother     SOCIAL HISTORY:  Social History   Tobacco Use   Smoking status: Former    Packs/day: 1.00    Years: 40.00    Pack years: 40.00    Types: Cigarettes    Quit date: 02/03/2001    Years since quitting: 19.7   Smokeless tobacco: Never  Vaping Use   Vaping Use: Never used  Substance Use Topics   Alcohol use: Yes    Alcohol/week: 2.0 standard drinks    Types: 2 Standard drinks or equivalent per week    Comment: 2 drinks daily   Drug use: No    ALLERGIES:  Allergies  Allergen Reactions   Other Shortness Of Breath    Cats    MEDICATIONS:  Current Outpatient Medications   Medication Sig Dispense Refill   acetaminophen (TYLENOL) 500 MG tablet Take 500 mg by mouth every 6 (six) hours as needed for moderate pain or headache.     albuterol (VENTOLIN HFA) 108 (90 Base) MCG/ACT inhaler Inhale 2 puffs into the lungs every 4 (four) hours as needed for shortness of breath (shortness of breath (related to COPD)).     amLODipine (NORVASC) 5 MG tablet Take 1 tablet (5 mg total) by mouth daily. Patient needs appointment for further refills. 1 st attempt 30 tablet 0   atorvastatin (LIPITOR) 20 MG tablet Take 1 tablet (20 mg total) by mouth daily. Patient needs to keep appointment for further refills. 1 st attempt 30 tablet 0   Budeson-Glycopyrrol-Formoterol (BREZTRI AEROSPHERE) 160-9-4.8 MCG/ACT AERO Inhale 2 puffs into the lungs 2 (two) times daily. 10.7 g 4   carvedilol (COREG) 3.125 MG tablet Take 1 tablet (3.125 mg total) by mouth 2 (two) times daily with a meal. 60 tablet 0  ferrous sulfate 325 (65 FE) MG EC tablet Take 325 mg by mouth daily with breakfast.     fexofenadine (ALLEGRA) 180 MG tablet Take 180 mg by mouth daily.     hydroxychloroquine (PLAQUENIL) 200 MG tablet Take 200 mg by mouth daily.  3   leflunomide (ARAVA) 20 MG tablet Take 20 mg by mouth daily.     loperamide (IMODIUM A-D) 2 MG tablet Take 4 mg by mouth as needed for diarrhea or loose stools (MAX 10 PILLS/24 HRS.).     montelukast (SINGULAIR) 10 MG tablet Take 1 tablet (10 mg total) by mouth at bedtime. 90 tablet 1   omeprazole (PRILOSEC) 20 MG capsule Take 1 capsule (20 mg total) by mouth daily. 90 capsule 1   ondansetron (ZOFRAN-ODT) 4 MG disintegrating tablet Take 1 tablet (4 mg total) by mouth every 8 (eight) hours as needed for nausea or vomiting. 10 tablet 0   predniSONE (DELTASONE) 5 MG tablet Take 5 mg by mouth daily with breakfast.     sertraline (ZOLOFT) 100 MG tablet Take 100 mg by mouth daily.      traMADol (ULTRAM) 50 MG tablet Take 50 mg by mouth every 6 (six) hours as needed for pain  (pain).     valsartan (DIOVAN) 320 MG tablet Take 320 mg by mouth in the morning.     vitamin B-12 (CYANOCOBALAMIN) 1000 MCG tablet Take 1,000 mcg by mouth daily.      apixaban (ELIQUIS) 2.5 MG TABS tablet Take 1 tablet (2.5 mg total) by mouth 2 (two) times daily. (Patient not taking: Reported on 10/25/2020) 60 tablet 5   No current facility-administered medications for this encounter.    REVIEW OF SYSTEMS:  A 10+ POINT REVIEW OF SYSTEMS WAS OBTAINED including neurology, dermatology, psychiatry, cardiac, respiratory, lymph, extremities, GI, GU, musculoskeletal, constitutional, reproductive, HEENT.  Patient is on 2 L of oxygen continuously.  She has FEV1 of approximately 50% of predicted.   PHYSICAL EXAM:  height is 5' (1.524 m) and weight is 108 lb 12.8 oz (49.4 kg). Her temperature is 97.6 F (36.4 C). Her blood pressure is 133/55 (abnormal) and her pulse is 89. Her respiration is 20 and oxygen saturation is 97%.   General: Alert and oriented, in no acute distress, sitting comfortably in a rolling walker. HEENT: Head is normocephalic. Extraocular movements are intact.  Neck: Neck is supple, no palpable cervical or supraclavicular lymphadenopathy. Heart: Regular in rate and rhythm with no murmurs, rubs, or gallops. Chest: Clear to auscultation bilaterally, with no rhonchi, wheezes, or rales. Abdomen: Soft, nontender, nondistended, with no rigidity or guarding. Extremities: No cyanosis or edema. Lymphatics: see Neck Exam Skin: No concerning lesions. Musculoskeletal: symmetric strength and muscle tone throughout. Neurologic: Cranial nerves II through XII are grossly intact. No obvious focalities. Speech is fluent. Coordination is intact. Psychiatric: Judgment and insight are intact. Affect is appropriate.   ECOG = 2  0 - Asymptomatic (Fully active, able to carry on all predisease activities without restriction)  1 - Symptomatic but completely ambulatory (Restricted in physically strenuous  activity but ambulatory and able to carry out work of a light or sedentary nature. For example, light housework, office work)  2 - Symptomatic, <50% in bed during the day (Ambulatory and capable of all self care but unable to carry out any work activities. Up and about more than 50% of waking hours)  3 - Symptomatic, >50% in bed, but not bedbound (Capable of only limited self-care, confined to bed or chair  50% or more of waking hours)  4 - Bedbound (Completely disabled. Cannot carry on any self-care. Totally confined to bed or chair)  5 - Death   Eustace Pen MM, Creech RH, Tormey DC, et al. [phone]). "Toxicity and response criteria of the [facility]". [place] Oncol. 5 (6): 649-55  LABORATORY DATA:  Lab Results  Component Value Date   WBC 6.5 09/24/2020   HGB 8.4 (A) 09/24/2020   HCT 26 (A) 09/24/2020   MCV 91.7 08/05/2020   PLT 263 09/24/2020   NEUTROABS 5.46 09/24/2020   Lab Results  Component Value Date   NA 132 (A) 09/24/2020   K 3.7 09/24/2020   CL 99 09/24/2020   CO2 28 (A) 09/24/2020   GLUCOSE 88 08/05/2020   CREATININE 0.7 09/24/2020   CALCIUM 8.7 09/24/2020      RADIOGRAPHY: No results found.    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  Given the patient's significant respiratory status as well as performance status I am unsure whether she could tolerate conventional radiation therapy encompassing the left lower lobe nodule in the hilar region.  In reviewing the patient's images it would appear to me that both of these areas biopsied were in the lung parenchyma and therefore patient would have 2 separate clinical stage I non-small cell lung cancers which would be amenable to  SBRT treatment.  The advantage of this treatment would be much easier for the patient as well as convenient with greater likelihood of success.  Today, I talked to the patient and daughter about the findings and work-up thus far.  We discussed the natural history of non-small cell lung cancer and general treatment, highlighting the role of radiotherapy in the management.  We discussed the available radiation techniques, and focused on the details of logistics and delivery.  We reviewed the anticipated acute and late sequelae associated with radiation in this setting.  The patient was encouraged to ask questions that I answered to the best of my ability.  A patient consent form was discussed and signed.  We retained a copy for our records.  The patient would like to proceed with radiation and will be scheduled for CT simulation.  PLAN: Patient is scheduled for CT simulation in approximately a week.  I will clarify biopsy issues above with Dr. Valeta Harms.  Anticipate between 3 and 5 SBRT treatments directed at both areas   60 minutes of total time was spent for this patient encounter, including preparation, face-to-face counseling with the patient and coordination of care, physical exam, and documentation of the encounter.   ------------------------------------------------  Blair Promise, PhD, MD  This document serves as a record of services personally performed by Gery Pray, MD. It was created on his behalf by Roney Mans, a trained medical scribe. The creation of this record is based on the scribe's personal observations and the provider's statements to them. This document has been checked and approved by the attending provider.

## 2020-10-25 ENCOUNTER — Ambulatory Visit
Admission: RE | Admit: 2020-10-25 | Discharge: 2020-10-25 | Disposition: A | Discharge status: Home / Self Care | Payer: Medicare PPO | Source: Ambulatory Visit | Attending: Radiation Oncology | Admitting: Radiation Oncology

## 2020-10-25 ENCOUNTER — Other Ambulatory Visit: Payer: Self-pay

## 2020-10-25 ENCOUNTER — Encounter: Payer: Self-pay | Admitting: Radiation Oncology

## 2020-10-25 VITALS — BP 133/55 | HR 89 | Temp 97.6°F | Resp 20 | Ht 60.0 in | Wt 108.8 lb

## 2020-10-25 DIAGNOSIS — E782 Mixed hyperlipidemia: Secondary | ICD-10-CM | POA: Insufficient documentation

## 2020-10-25 DIAGNOSIS — M069 Rheumatoid arthritis, unspecified: Secondary | ICD-10-CM | POA: Diagnosis not present

## 2020-10-25 DIAGNOSIS — I2510 Atherosclerotic heart disease of native coronary artery without angina pectoris: Secondary | ICD-10-CM | POA: Insufficient documentation

## 2020-10-25 DIAGNOSIS — G620 Drug-induced polyneuropathy: Secondary | ICD-10-CM | POA: Diagnosis not present

## 2020-10-25 DIAGNOSIS — N183 Chronic kidney disease, stage 3 unspecified: Secondary | ICD-10-CM | POA: Diagnosis not present

## 2020-10-25 DIAGNOSIS — I4891 Unspecified atrial fibrillation: Secondary | ICD-10-CM | POA: Insufficient documentation

## 2020-10-25 DIAGNOSIS — J432 Centrilobular emphysema: Secondary | ICD-10-CM | POA: Diagnosis not present

## 2020-10-25 DIAGNOSIS — M810 Age-related osteoporosis without current pathological fracture: Secondary | ICD-10-CM | POA: Diagnosis not present

## 2020-10-25 DIAGNOSIS — K219 Gastro-esophageal reflux disease without esophagitis: Secondary | ICD-10-CM | POA: Insufficient documentation

## 2020-10-25 DIAGNOSIS — Z803 Family history of malignant neoplasm of breast: Secondary | ICD-10-CM | POA: Insufficient documentation

## 2020-10-25 DIAGNOSIS — C182 Malignant neoplasm of ascending colon: Secondary | ICD-10-CM

## 2020-10-25 DIAGNOSIS — I480 Paroxysmal atrial fibrillation: Secondary | ICD-10-CM | POA: Diagnosis not present

## 2020-10-25 DIAGNOSIS — C3432 Malignant neoplasm of lower lobe, left bronchus or lung: Secondary | ICD-10-CM | POA: Insufficient documentation

## 2020-10-25 DIAGNOSIS — I129 Hypertensive chronic kidney disease with stage 1 through stage 4 chronic kidney disease, or unspecified chronic kidney disease: Secondary | ICD-10-CM | POA: Insufficient documentation

## 2020-10-25 DIAGNOSIS — Z79899 Other long term (current) drug therapy: Secondary | ICD-10-CM | POA: Diagnosis not present

## 2020-10-25 DIAGNOSIS — D649 Anemia, unspecified: Secondary | ICD-10-CM | POA: Diagnosis not present

## 2020-10-25 DIAGNOSIS — Z7901 Long term (current) use of anticoagulants: Secondary | ICD-10-CM | POA: Insufficient documentation

## 2020-10-25 DIAGNOSIS — Z86711 Personal history of pulmonary embolism: Secondary | ICD-10-CM | POA: Diagnosis not present

## 2020-10-25 NOTE — Progress Notes (Signed)
See MD note for nursing evaluation. °

## 2020-10-26 ENCOUNTER — Encounter: Payer: Self-pay | Admitting: General Practice

## 2020-10-26 NOTE — Progress Notes (Signed)
[facility] Psychosocial Distress Screening Clinical Social Work  Clinical Social Work was referred by distress screening protocol.  The patient scored a 5 on the Psychosocial Distress Thermometer which indicates moderate distress. Clinical Social Worker contacted patient by phone to assess for distress and other psychosocial needs. Unable to reach patient, no VM set up.  Called daughter, Weronika Birch, as transportation was mentioned as an issue.  Per daughter, assisted living facility has committed to providing transportation to/from appointments.  Daughter reports decline in short term memory.  [place] came to [place] summer, mother was living in independent living. Daughter found her in poor health, multiple falls.  Patient would cancel appointments often, resisted care, "tried to hide how sick she was."  Patient's current PCP is Glory Buff, NP comes to the facility.    Daughter lives in [place], son in [place].  Daughter's phone rings her cell phone - please use her cell.  Patient's VM is not set up.  Patient was placed at [facility] after discharge from SNF placement post inpatient treatment.  Patient has been at ALF approx 2 months.  Patient's oncologist is at [facility], she will be coming to [employer] for 3 - 5 SBIRT treatments.    Assisted living provides all care including transportation.  Enid Derry at [facility] coordinates transportation.    Daughter requested information on geriatric care managers, referred to [facility] for referrals. ONCBCN DISTRESS SCREENING 10/25/2020  Screening Type Initial Screening  Distress experienced in past week (1-10) 5  Practical problem type Transportation;Food  Emotional problem type Depression;Nervousness/Anxiety;Boredom;Adjusting to appearance changes  Spiritual/Religous concerns type Facing my mortality;Loss of sense of purpose  Information Concerns Type Lack of info about diagnosis;Lack of info about  treatment;Lack of info about complementary therapy choices;Lack of info about maintaining fitness  Physical Problem type Pain;Getting around;Breathing     Clinical Social Worker follow up needed: No.  If yes, follow up plan:  Beverely Pace, [place], LCSW Clinical Social Worker Phone:  [phone]

## 2020-11-01 ENCOUNTER — Ambulatory Visit
Admission: RE | Admit: 2020-11-01 | Discharge: 2020-11-01 | Disposition: A | Discharge status: Home / Self Care | Payer: Medicare PPO | Source: Ambulatory Visit | Attending: Radiation Oncology | Admitting: Radiation Oncology

## 2020-11-01 ENCOUNTER — Other Ambulatory Visit: Payer: Self-pay

## 2020-11-01 DIAGNOSIS — C3432 Malignant neoplasm of lower lobe, left bronchus or lung: Secondary | ICD-10-CM | POA: Diagnosis not present

## 2020-11-01 DIAGNOSIS — C182 Malignant neoplasm of ascending colon: Secondary | ICD-10-CM | POA: Insufficient documentation

## 2020-11-08 ENCOUNTER — Telehealth: Payer: Self-pay | Admitting: Oncology

## 2020-11-08 NOTE — Telephone Encounter (Signed)
Patient's daughter called to verify Oct Appt's

## 2020-11-12 DIAGNOSIS — C3432 Malignant neoplasm of lower lobe, left bronchus or lung: Secondary | ICD-10-CM | POA: Diagnosis not present

## 2020-11-13 ENCOUNTER — Other Ambulatory Visit: Payer: Self-pay

## 2020-11-13 ENCOUNTER — Ambulatory Visit
Admission: RE | Admit: 2020-11-13 | Discharge: 2020-11-13 | Disposition: A | Discharge status: Home / Self Care | Payer: Medicare PPO | Source: Ambulatory Visit | Attending: Radiation Oncology | Admitting: Radiation Oncology

## 2020-11-13 DIAGNOSIS — C3432 Malignant neoplasm of lower lobe, left bronchus or lung: Secondary | ICD-10-CM | POA: Diagnosis not present

## 2020-11-15 ENCOUNTER — Ambulatory Visit: Payer: Medicare PPO | Admitting: Radiation Oncology

## 2020-11-15 ENCOUNTER — Other Ambulatory Visit: Payer: Self-pay

## 2020-11-15 ENCOUNTER — Ambulatory Visit
Admission: RE | Admit: 2020-11-15 | Discharge: 2020-11-15 | Disposition: A | Discharge status: Home / Self Care | Payer: Medicare PPO | Source: Ambulatory Visit | Attending: Radiation Oncology | Admitting: Radiation Oncology

## 2020-11-15 DIAGNOSIS — C3432 Malignant neoplasm of lower lobe, left bronchus or lung: Secondary | ICD-10-CM

## 2020-11-16 ENCOUNTER — Ambulatory Visit: Payer: Medicare PPO | Admitting: Radiation Oncology

## 2020-11-20 ENCOUNTER — Ambulatory Visit
Admission: RE | Admit: 2020-11-20 | Discharge: 2020-11-20 | Disposition: A | Discharge status: Home / Self Care | Payer: Medicare PPO | Source: Ambulatory Visit | Attending: Radiation Oncology | Admitting: Radiation Oncology

## 2020-11-20 DIAGNOSIS — C3432 Malignant neoplasm of lower lobe, left bronchus or lung: Secondary | ICD-10-CM | POA: Diagnosis not present

## 2020-11-20 NOTE — Progress Notes (Signed)
[facility]  [address] [place],  [place]  [address] [phone]  Clinic Day:  11/26/2020  Referring physician: Harvel Quale, NP  This document serves as a record of services personally performed by Marice Potter, MD. It was created on their behalf by [facility] E, a trained medical scribe. The creation of this record is based on the scribe's personal observations and the provider's statements to them.  HISTORY OF PRESENT ILLNESS:  The patient is a 75 y.o. 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 Due to her poor baseline health, adjuvant chemotherapy was not entertained.  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  The patient brings to my attention that he is scheduled to receive her last of 5 stereotactic radiation treatments tomorrow to address her lung cancer.  She comes in today for routine follow up.  As it pertains to her colon cancer, she denies having any GI symptoms which concern her for early disease recurrence.  She also denies having any new respiratory symptoms as it pertains to her lung cancer.    PHYSICAL EXAM:  Blood pressure (!) 141/70, pulse 96, temperature 98.5 F (36.9 C), resp. rate 16, height 5' (1.524 m), weight 110 lb 9.6 oz (50.2 kg), SpO2 95 %. Wt Readings from Last 3 Encounters:  11/26/20 110 lb 9.6 oz (50.2 kg)  10/25/20 108 lb 12.8 oz (49.4 kg)  10/10/20 111 lb (50.3 kg)   Body mass index is 21.6 kg/m. Performance status (ECOG): 3 - Symptomatic, >50% confined to bed Physical Exam Constitutional:      Appearance: Normal appearance. She is ill-appearing.     Comments: Chronically-ill appearing woman in a wheelchair, wearing oxygen per nasal canula  HENT:     Mouth/Throat:     Mouth: Mucous membranes are moist.     Pharynx: Oropharynx is clear. No oropharyngeal exudate or posterior oropharyngeal  erythema.  Cardiovascular:     Rate and Rhythm: Normal rate and regular rhythm.     Heart sounds: No murmur heard.   No friction rub. No gallop.  Pulmonary:     Effort: Pulmonary effort is normal. No respiratory distress.     Breath sounds: Normal breath sounds. No wheezing, rhonchi or rales.     Comments: Diffusely decreased breath sounds bilaterally Abdominal:     General: Bowel sounds are normal. There is no distension.     Palpations: Abdomen is soft. There is no mass.     Tenderness: There is no abdominal tenderness.  Musculoskeletal:        General: No swelling.     Right lower leg: No edema.     Left lower leg: No edema.  Lymphadenopathy:     Cervical: No cervical adenopathy.     Upper Body:     Right upper body: No supraclavicular or axillary adenopathy.     Left upper body: No supraclavicular or axillary adenopathy.     Lower Body: No right inguinal adenopathy. No left inguinal adenopathy.  Skin:    General: Skin is warm.     Coloration: Skin is not jaundiced.     Findings: No lesion or rash.  Neurological:     General: No focal deficit present.     Mental Status: She is alert and oriented to person, place, and time. Mental status is at baseline.  Cranial Nerves: Cranial nerves are intact.  Psychiatric:        Mood and Affect: Mood normal.        Behavior: Behavior normal.        Thought Content: Thought content normal.    LABS:   CEA  Date Value Ref Range Status  11/23/2020 7.5 (H) 0.0 - 4.7 ng/mL Final    Comment:    (NOTE)                             Nonsmokers          <3.9                             Smokers             <5.6 [employer] Electrochemiluminescence Immunoassay (ECLIA) Values obtained with different assay methods or kits cannot be used interchangeably.  Results cannot be interpreted as absolute evidence of the presence or absence of malignant disease. Performed At: [facility] [place], [place]  [id number] Rush Farmer MD WG:[id number]     ASSESSMENT & PLAN:  A 75 y.o. 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  Her most recent CEA level is higher than what it was previously.  However, this could be related to the stereotactic radiation she is currently receiving for her lung cancer.  No inferences will be made on her current CEA level.  I will see her back in December 2022 to reassess her CEA level.  If it comes back even higher at that time, a repeat PET scan would be done to check for persistent/recurrent disease.   The patient understands all the plans discussed today and is in agreement with them.   I, Rita Ohara, am acting as scribe for Marice Potter, MD    I have reviewed this report as typed by the medical scribe, and it is complete and accurate.  Garrison Michie Macarthur Critchley, MD

## 2020-11-22 ENCOUNTER — Other Ambulatory Visit: Payer: Self-pay

## 2020-11-22 ENCOUNTER — Ambulatory Visit
Admission: RE | Admit: 2020-11-22 | Discharge: 2020-11-22 | Disposition: A | Discharge status: Home / Self Care | Payer: Medicare PPO | Source: Ambulatory Visit | Attending: Radiation Oncology | Admitting: Radiation Oncology

## 2020-11-22 DIAGNOSIS — C3432 Malignant neoplasm of lower lobe, left bronchus or lung: Secondary | ICD-10-CM | POA: Diagnosis not present

## 2020-11-23 ENCOUNTER — Inpatient Hospital Stay: Payer: Medicare PPO | Attending: Oncology

## 2020-11-23 DIAGNOSIS — C182 Malignant neoplasm of ascending colon: Secondary | ICD-10-CM | POA: Insufficient documentation

## 2020-11-23 DIAGNOSIS — C3432 Malignant neoplasm of lower lobe, left bronchus or lung: Secondary | ICD-10-CM | POA: Insufficient documentation

## 2020-11-24 LAB — CEA: CEA: 7.5 ng/mL — ABNORMAL HIGH (ref 0.0–4.7)

## 2020-11-26 ENCOUNTER — Other Ambulatory Visit: Payer: Self-pay | Admitting: Oncology

## 2020-11-26 ENCOUNTER — Inpatient Hospital Stay (INDEPENDENT_AMBULATORY_CARE_PROVIDER_SITE_OTHER): Payer: Medicare PPO | Admitting: Oncology

## 2020-11-26 ENCOUNTER — Other Ambulatory Visit: Payer: Self-pay

## 2020-11-26 ENCOUNTER — Ambulatory Visit: Payer: Medicare PPO | Admitting: Radiation Oncology

## 2020-11-26 VITALS — BP 141/70 | HR 96 | Temp 98.5°F | Resp 16 | Ht 60.0 in | Wt 110.6 lb

## 2020-11-26 DIAGNOSIS — C182 Malignant neoplasm of ascending colon: Secondary | ICD-10-CM | POA: Diagnosis not present

## 2020-11-26 DIAGNOSIS — C3432 Malignant neoplasm of lower lobe, left bronchus or lung: Secondary | ICD-10-CM

## 2020-11-27 ENCOUNTER — Encounter: Payer: Self-pay | Admitting: Radiation Oncology

## 2020-11-27 ENCOUNTER — Other Ambulatory Visit: Payer: Self-pay

## 2020-11-27 ENCOUNTER — Ambulatory Visit
Admission: RE | Admit: 2020-11-27 | Discharge: 2020-11-27 | Disposition: A | Discharge status: Home / Self Care | Payer: Medicare PPO | Source: Ambulatory Visit | Attending: Radiation Oncology | Admitting: Radiation Oncology

## 2020-11-27 DIAGNOSIS — C3432 Malignant neoplasm of lower lobe, left bronchus or lung: Secondary | ICD-10-CM | POA: Diagnosis not present

## 2021-01-02 ENCOUNTER — Encounter: Payer: Self-pay | Admitting: Radiation Oncology

## 2021-01-06 NOTE — Progress Notes (Addendum)
Radiation Oncology         [phone] ________________________________  Name: Desiree [name] MRN: [id number]  Date: 01/07/2021  DOB: 02-02-1946  Follow-Up Visit Note  CC: Harvel Quale, NP  Marice Potter, MD    ICD-10-CM   1. Malignant neoplasm of lower lobe of left lung [facility])  C34.32 CT CHEST WO CONTRAST      Diagnosis:  The primary encounter diagnosis was Malignant neoplasm of lower lobe of left lung ([place]). A diagnosis of Cancer of ascending colon [facility]) was also pertinent to this visit.   Stage IIIA (pT2, pN1a, cM0) cancer of ascending colon; status post right hemicolectomy in June 2022, now with Stage IA2 (cT1b, cN0, cM0) squamous cell lung cancer (LLL nodule)  Interval Since Last Radiation: 1 month and 11 days   Intent: Curative  Radiation Treatment Dates: 11/13/2020 through 11/27/2020 Site Technique Total Dose (Gy) Dose per Fx (Gy) Completed Fx Beam Energies  Lung, Left: Lung_Lt SBRT 50/50 10 5/5 6XFFF  Lung, Left: Lung_Lt_Lung Lt SBRT 54/54 18 3/3 6XFFF    Narrative:  The patient returns today for routine follow-up. The patient tolerated radiation therapy relatively well other than reports of shortness of breath with activity, fatigue, and occasional productive cough with clear phlegm. Patient denied hemoptysis, difficulty or pain with swallowing, skin changes, or irritation.                   Near the end of her radiation treatment, the patient followed up with Dr. Bobby Rumpf [facility]) on 11/26/20. In regards to her colon cancer Dx, the patient was noted to deny any GI symptoms concerning for early disease recurrence.  She also denied any new respiratory symptoms. Physical exam performed during this visit revealed diffusely decreased breath sounds bilaterally.  Otherwise, no significant interval history since the patient was seen for her initial consultation.  She reports chronic fatigue but nothing above her baseline.  She denies any significant cough or  changes in her breathing since her radiation therapy.  She denies any hemoptysis.  She continues on 3 L of oxygen.  Allergies:  is allergic to other.  Meds: Current Outpatient Medications  Medication Sig Dispense Refill   acetaminophen (TYLENOL) 500 MG tablet Take 500 mg by mouth every 6 (six) hours as needed for moderate pain or headache.     albuterol (VENTOLIN HFA) 108 (90 Base) MCG/ACT inhaler Inhale 2 puffs into the lungs every 4 (four) hours as needed for shortness of breath (shortness of breath (related to COPD)).     amLODipine (NORVASC) 5 MG tablet Take 1 tablet (5 mg total) by mouth daily. Patient needs appointment for further refills. 1 st attempt 30 tablet 0   atorvastatin (LIPITOR) 20 MG tablet Take 1 tablet (20 mg total) by mouth daily. Patient needs to keep appointment for further refills. 1 st attempt 30 tablet 0   Budeson-Glycopyrrol-Formoterol (BREZTRI AEROSPHERE) 160-9-4.8 MCG/ACT AERO Inhale 2 puffs into the lungs 2 (two) times daily. 10.7 g 4   carvedilol (COREG) 3.125 MG tablet Take 1 tablet (3.125 mg total) by mouth 2 (two) times daily with a meal. 60 tablet 0   ferrous sulfate 325 (65 FE) MG EC tablet Take 325 mg by mouth daily with breakfast.     fexofenadine (ALLEGRA) 180 MG tablet Take 180 mg by mouth daily.     hydroxychloroquine (PLAQUENIL) 200 MG tablet Take 200 mg by mouth daily.  3   leflunomide (ARAVA) 20 MG tablet Take  20 mg by mouth daily.     loperamide (IMODIUM A-D) 2 MG tablet Take 4 mg by mouth as needed for diarrhea or loose stools (MAX 10 PILLS/24 HRS.).     montelukast (SINGULAIR) 10 MG tablet Take 1 tablet (10 mg total) by mouth at bedtime. 90 tablet 1   omeprazole (PRILOSEC) 20 MG capsule Take 1 capsule (20 mg total) by mouth daily. 90 capsule 1   ondansetron (ZOFRAN-ODT) 4 MG disintegrating tablet Take 1 tablet (4 mg total) by mouth every 8 (eight) hours as needed for nausea or vomiting. 10 tablet 0   predniSONE (DELTASONE) 5 MG tablet Take 5 mg by  mouth daily with breakfast.     sertraline (ZOLOFT) 100 MG tablet Take 100 mg by mouth daily.      traMADol (ULTRAM) 50 MG tablet Take 50 mg by mouth every 6 (six) hours as needed for pain (pain).     valsartan (DIOVAN) 320 MG tablet Take 320 mg by mouth in the morning.     vitamin B-12 (CYANOCOBALAMIN) 1000 MCG tablet Take 1,000 mcg by mouth daily.      apixaban (ELIQUIS) 2.5 MG TABS tablet Take 1 tablet (2.5 mg total) by mouth 2 (two) times daily. (Patient not taking: Reported on 10/25/2020) 60 tablet 5   No current facility-administered medications for this encounter.    Physical Findings: The patient is in no acute distress. Patient is alert and oriented.  height is 5' (1.524 m) and weight is 111 lb 3.2 oz (50.4 kg). Her temperature is 97.9 F (36.6 C). Her blood pressure is 144/80 (abnormal) and her pulse is 95. Her respiration is 24 (abnormal) and oxygen saturation is 97%. .  Sitting comfortably in wheelchair supplemental oxygen in place at 3 L lungs are clear to auscultation bilaterally. Heart has regular rate and rhythm. No palpable cervical, supraclavicular, or axillary adenopathy.    Lab Findings: Lab Results  Component Value Date   WBC 6.5 09/24/2020   HGB 8.4 (A) 09/24/2020   HCT 26 (A) 09/24/2020   MCV 91.7 08/05/2020   PLT 263 09/24/2020    Radiographic Findings: No results found.  Impression:  The primary encounter diagnosis was Malignant neoplasm of lower lobe of left lung ([place]). A diagnosis of Cancer of ascending colon [facility]) was also pertinent to this visit.   Stage IIIA (pT2, pN1a, cM0) cancer of ascending colon; status post right hemicolectomy in June 2022, now with Stage IA2 (cT1b, cN0, cM0) squamous cell lung cancer (LLL nodule)  The patient tolerated her SBRT well without any lasting side effects.  Both lesions in the left lung area were treated with SBRT.  Plan: Patient will undergo a chest CT scan in 3 months and then follow-up with me soon afterward.  Given  her residence in [place] she would like to have additional chest CT scans in [place].  She is ok with coming back here for 1 more trip with her chest CT scan and then follow-up.  Then we can arrange for her to follow-up with Dr. Bobby Rumpf down in [place].   ____________________________________  Blair Promise, PhD, MD   This document serves as a record of services personally performed by Gery Pray, MD. It was created on his behalf by Roney Mans, a trained medical scribe. The creation of this record is based on the scribe's personal observations and the provider's statements to them. This document has been checked and approved by the attending provider.

## 2021-01-06 NOTE — Progress Notes (Incomplete)
  Radiation Oncology         [phone] ________________________________  Patient Name: Desiree [name] MRN: [id number] DOB: [birth year] Referring Physician: Lavera Guise (Profile Not Attached) Date of Service: 11/27/2020 [facility], [place]   End Of Treatment Note  Diagnoses: C34.32-Malignant neoplasm of lower lobe, left bronchus or lung  Cancer Staging: Stage IA2 (cT1b, cN0, cM0) squamous cell lung cancer (LLL nodule)  Intent: Curative  Radiation Treatment Dates: 11/13/2020 through 11/27/2020 Site Technique Total Dose (Gy) Dose per Fx (Gy) Completed Fx Beam Energies  Lung, Left: Lung_Lt IMRT 50/50 10 5/5 6XFFF  Lung, Left: Lung_Lt_Lung Lt IMRT 54/54 18 3/3 6XFFF   Narrative: The patient tolerated radiation therapy relatively well. Reports shortness of breath with activity, as well as occasional productive cough with clear phlegm. Patient denies hemoptysis, and difficulty or pain with swallowing. Reports fair energy level. Appetite is very good. Sleeping well. Denies skin changes or irritation. Her only change has been some fatigue since the beginning of her treatment.    Plan: The patient will follow-up with radiation oncology in one month .  ________________________________________________ -----------------------------------  Blair Promise, PhD, MD  This document serves as a record of services personally performed by Gery Pray, MD. It was created on his behalf by Roney Mans, a trained medical scribe. The creation of this record is based on the scribe's personal observations and the provider's statements to them. This document has been checked and approved by the attending provider.

## 2021-01-07 ENCOUNTER — Ambulatory Visit
Admission: RE | Admit: 2021-01-07 | Discharge: 2021-01-07 | Disposition: A | Discharge status: Home / Self Care | Payer: Medicare PPO | Source: Ambulatory Visit | Attending: Radiation Oncology | Admitting: Radiation Oncology

## 2021-01-07 ENCOUNTER — Other Ambulatory Visit: Payer: Self-pay

## 2021-01-07 ENCOUNTER — Encounter: Payer: Self-pay | Admitting: Radiation Oncology

## 2021-01-07 DIAGNOSIS — Z923 Personal history of irradiation: Secondary | ICD-10-CM | POA: Diagnosis not present

## 2021-01-07 DIAGNOSIS — Z79899 Other long term (current) drug therapy: Secondary | ICD-10-CM | POA: Insufficient documentation

## 2021-01-07 DIAGNOSIS — Z7901 Long term (current) use of anticoagulants: Secondary | ICD-10-CM | POA: Insufficient documentation

## 2021-01-07 DIAGNOSIS — C3432 Malignant neoplasm of lower lobe, left bronchus or lung: Secondary | ICD-10-CM | POA: Insufficient documentation

## 2021-01-07 HISTORY — DX: Personal history of irradiation: Z92.3

## 2021-01-07 NOTE — Progress Notes (Signed)
Desiree [name] is here today for follow up post radiation to the lung.  Lung Side: Left, patient completed treatment on 11/27/20  Does the patient complain of any of the following: Pain: Patient denies pain. Shortness of breath w/wo exertion: yes, patient  currently on 3L via nasal canula. Cough: no Hemoptysis: no Pain with swallowing: no Swallowing/choking concerns: no Appetite: good Energy Level: Patient continues to have a low energy level.  Post radiation skin Changes: no, skin remains intact. Weights-  Wt Readings from Last 3 Encounters:  01/07/21 111 lb 3.2 oz (50.4 kg)  11/26/20 110 lb 9.6 oz (50.2 kg)  10/25/20 108 lb 12.8 oz (49.4 kg)      Additional comments if applicable:  Vitals:   18/40/37 1554  BP: (!) 144/80  Pulse: 95  Resp: (!) 24  Temp: 97.9 F (36.6 C)  SpO2: 97%  Weight: 111 lb 3.2 oz (50.4 kg)  Height: 5' (1.524 m)  PF: (!) 3 L/min

## 2021-01-29 ENCOUNTER — Other Ambulatory Visit: Payer: Medicare PPO

## 2021-01-30 ENCOUNTER — Ambulatory Visit: Payer: Medicare PPO | Admitting: Oncology

## 2021-02-02 ENCOUNTER — Encounter: Payer: Self-pay | Admitting: Oncology

## 2021-02-06 ENCOUNTER — Inpatient Hospital Stay: Payer: Medicare PPO | Attending: Oncology

## 2021-02-06 ENCOUNTER — Other Ambulatory Visit: Payer: Self-pay

## 2021-02-06 DIAGNOSIS — Z923 Personal history of irradiation: Secondary | ICD-10-CM | POA: Diagnosis not present

## 2021-02-06 DIAGNOSIS — Z85118 Personal history of other malignant neoplasm of bronchus and lung: Secondary | ICD-10-CM | POA: Insufficient documentation

## 2021-02-06 DIAGNOSIS — Z85038 Personal history of other malignant neoplasm of large intestine: Secondary | ICD-10-CM | POA: Insufficient documentation

## 2021-02-06 DIAGNOSIS — C182 Malignant neoplasm of ascending colon: Secondary | ICD-10-CM

## 2021-02-07 LAB — CEA: CEA: 9.2 ng/mL — ABNORMAL HIGH (ref 0.0–4.7)

## 2021-02-07 NOTE — Progress Notes (Signed)
[facility]  [address] [place],  [place]  [address] [phone]  Clinic Day:  02/11/2021  Referring physician: Harvel Quale, NP  This document serves as a record of services personally performed by Marice Potter, MD. It was created on their behalf by [facility] E, a trained medical scribe. The creation of this record is based on the scribe's personal observations and the provider's statements to them.  HISTORY OF PRESENT ILLNESS:  The patient is a 76 y.o. 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 Due to her poor baseline health, adjuvant chemotherapy was not entertained.  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  The patient completed stereotactic radiation for her lung cancer in October 2022.  She comes in today for routine follow up.  As it pertains to her colon cancer, she denies having any GI symptoms which concern her for early disease recurrence.  She also denies having any new respiratory symptoms as it pertains to her lung cancer.    PHYSICAL EXAM:  Blood pressure (!) 154/71, pulse 89, temperature 98.2 F (36.8 C), resp. rate 14, height 5' (1.524 m), weight 110 lb 6.4 oz (50.1 kg), SpO2 90 %. Wt Readings from Last 3 Encounters:  02/11/21 110 lb 6.4 oz (50.1 kg)  01/07/21 111 lb 3.2 oz (50.4 kg)  11/26/20 110 lb 9.6 oz (50.2 kg)   Body mass index is 21.56 kg/m. Performance status (ECOG): 3 - Symptomatic, >50% confined to bed Physical Exam Constitutional:      Appearance: Normal appearance. She is ill-appearing.     Comments: Chronically-ill appearing woman in a wheelchair, wearing oxygen per nasal canula  HENT:     Mouth/Throat:     Mouth: Mucous membranes are moist.     Pharynx: Oropharynx is clear. No oropharyngeal exudate or posterior oropharyngeal erythema.  Cardiovascular:     Rate and Rhythm: Normal rate and  regular rhythm.     Heart sounds: No murmur heard.   No friction rub. No gallop.  Pulmonary:     Effort: Pulmonary effort is normal. No respiratory distress.     Breath sounds: Normal breath sounds. No wheezing, rhonchi or rales.     Comments: Diffusely decreased breath sounds bilaterally Abdominal:     General: Bowel sounds are normal. There is no distension.     Palpations: Abdomen is soft. There is no mass.     Tenderness: There is no abdominal tenderness.  Musculoskeletal:        General: No swelling.     Right lower leg: No edema.     Left lower leg: No edema.  Lymphadenopathy:     Cervical: No cervical adenopathy.     Upper Body:     Right upper body: No supraclavicular or axillary adenopathy.     Left upper body: No supraclavicular or axillary adenopathy.     Lower Body: No right inguinal adenopathy. No left inguinal adenopathy.  Skin:    General: Skin is warm.     Coloration: Skin is not jaundiced.     Findings: No lesion or rash.  Neurological:     General: No focal deficit present.     Mental Status: She is alert and oriented to person, place, and time. Mental status is at baseline.  Psychiatric:        Mood and Affect: Mood normal.  Behavior: Behavior normal.        Thought Content: Thought content normal.    LABS:   CEA  Date Value Ref Range Status  02/06/2021 9.2 (H) 0.0 - 4.7 ng/mL Final    Comment:    (NOTE)                             Nonsmokers          <3.9                             Smokers             <5.6 [employer] Electrochemiluminescence Immunoassay (ECLIA) Values obtained with different assay methods or kits cannot be used interchangeably.  Results cannot be interpreted as absolute evidence of the presence or absence of malignant disease. Performed At: [facility] [place], [place] [id number] Rush Farmer MD [id number]     Latest Reference Range & Units 09/24/20 13:59 11/23/20 17:30 02/06/21 14:14   CEA 0.0 - 4.7 ng/mL 7.0 (H) 7.5 (H) 9.2 (H)  (H): Data is abnormally high  ASSESSMENT & PLAN:  A 76 y.o. 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  I am very concerned as her CEA level continues to rise, which potentially suggests recurrent/metastatic disease.  Based upon this, a repeat PET scan will be done in 2 weeks to check for persistent/recurrent disease.  I will see her back the following day to go over her PET scan images and their implications.  The patient understands all the plans discussed today and is in agreement with them.   I, Rita Ohara, am acting as scribe for Marice Potter, MD    I have reviewed this report as typed by the medical scribe, and it is complete and accurate.  Dequincy Macarthur Critchley, MD

## 2021-02-11 ENCOUNTER — Other Ambulatory Visit: Payer: Self-pay

## 2021-02-11 ENCOUNTER — Inpatient Hospital Stay (INDEPENDENT_AMBULATORY_CARE_PROVIDER_SITE_OTHER): Payer: Medicare PPO | Admitting: Oncology

## 2021-02-11 ENCOUNTER — Other Ambulatory Visit: Payer: Self-pay | Admitting: Oncology

## 2021-02-11 VITALS — BP 154/71 | HR 89 | Temp 98.2°F | Resp 14 | Ht 60.0 in | Wt 110.4 lb

## 2021-02-11 DIAGNOSIS — C3431 Malignant neoplasm of lower lobe, right bronchus or lung: Secondary | ICD-10-CM | POA: Diagnosis not present

## 2021-02-20 NOTE — Progress Notes (Signed)
[facility]  [address] [place],  [place]  [address] [phone]  Clinic Day:  02/26/2021  Referring physician: Harvel Quale, NP  This document serves as a record of services personally performed by Marice Potter, MD. It was created on their behalf by [facility] E, a trained medical scribe. The creation of this record is based on the scribe's personal observations and the provider's statements to them.  HISTORY OF PRESENT ILLNESS:  The patient is a 76 y.o. 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 Due to her poor baseline health, adjuvant chemotherapy was not entertained.  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  The patient completed stereotactic radiation for her lung cancer in October 2022.  The patient comes in today to go over her PET scan, which was done due to a persistently rising CEA level.  Despite this laboratory finding, the patient denies having any new GI symptoms which concern her for overt signs of disease progression.  PHYSICAL EXAM:  Blood pressure (!) 191/88, pulse 99, temperature 98.3 F (36.8 C), resp. rate 14, height 5' (1.524 m), weight 110 lb 4.8 oz (50 kg), SpO2 100 %. Wt Readings from Last 3 Encounters:  02/26/21 110 lb 4.8 oz (50 kg)  02/11/21 110 lb 6.4 oz (50.1 kg)  01/07/21 111 lb 3.2 oz (50.4 kg)   Body mass index is 21.54 kg/m. Performance status (ECOG): 3 - Symptomatic, >50% confined to bed Physical Exam Constitutional:      Appearance: Normal appearance. She is ill-appearing.     Comments: Chronically-ill appearing woman in a wheelchair, wearing oxygen per nasal canula  HENT:     Mouth/Throat:     Mouth: Mucous membranes are moist.     Pharynx: Oropharynx is clear. No oropharyngeal exudate or posterior oropharyngeal erythema.  Cardiovascular:     Rate and Rhythm: Normal rate and regular  rhythm.     Heart sounds: No murmur heard.   No friction rub. No gallop.  Pulmonary:     Effort: Pulmonary effort is normal. No respiratory distress.     Breath sounds: Normal breath sounds. No wheezing, rhonchi or rales.     Comments: Diffusely decreased breath sounds bilaterally Abdominal:     General: Bowel sounds are normal. There is no distension.     Palpations: Abdomen is soft. There is no mass.     Tenderness: There is no abdominal tenderness.  Musculoskeletal:        General: No swelling.     Right lower leg: No edema.     Left lower leg: No edema.  Lymphadenopathy:     Cervical: No cervical adenopathy.     Upper Body:     Right upper body: No supraclavicular or axillary adenopathy.     Left upper body: No supraclavicular or axillary adenopathy.     Lower Body: No right inguinal adenopathy. No left inguinal adenopathy.  Skin:    General: Skin is warm.     Coloration: Skin is not jaundiced.     Findings: No lesion or rash.  Neurological:     General: No focal deficit present.     Mental Status: She is alert and oriented to person, place, and time. Mental status is at baseline.  Psychiatric:        Mood and Affect: Mood normal.  Behavior: Behavior normal.        Thought Content: Thought content normal.   SCANS: Recent PET imaging has revealed the following:  FINDINGS:  Mediastinal blood pool activity: SUV max 2.0  Liver activity: SUV max   NECK: No hypermetabolic lymph nodes in the neck.  Incidental CT findings: none   CHEST: Smudgy nodule in the posterior aspect of the LEFT upper lobe  measures 11 mm (image 74/3) and has moderate metabolic activity for  size and density with SUV max equal 3.2 (image 72)  Second hypermetabolic pulmonary nodule is found along the pleural  surface of the lateral RIGHT upper lobe measuring 10 mm (image 87/3)  with SUV max equal 3.4 on image 87 fused data set.  Previous described nodularity in the LEFT lower lobe is near   completely resolved. There is a fiducial marker or surgical marker  noted on image 110/3. No metabolic activity associated with this  lesion. Additionally, no metabolic activity in the LEFT infrahilar  region of site of prior hypermetabolic nodule  No hypermetabolic mediastinal lymph nodes.  Incidental CT findings: none  ABDOMEN/PELVIS: No abnormal metabolic activity liver. Adrenal glands  normal.  Postsurgical change consistent with RIGHT hemicolectomy. There  scattered metabolic activity within the colon without associated  findings on CT.  Incidental CT findings: . Atherosclerotic calcification of the  aorta.   SKELETON: No focal hypermetabolic activity to suggest skeletal  metastasis.  Incidental CT findings: none   IMPRESSION:  1. Signal hypermetabolic nodule in the LEFT upper lobe and single  hypermetabolic nodule in the RIGHT upper lobe. Findings concerning  for lung metastasis versus synchronous bronchogenic carcinoma. Favor  lung metastasis. Recommend clinical correlation with prior oncology  and pathology history.  2. No evidence of metastatic adenopathy or distant metastatic  disease.  3. Resolution of metabolic activity of LEFT lobe pulmonary nodule  zone LEFT perihilar nodule.  4. Post RIGHT hemicolectomy anatomy. No evidence of malignancy in  the abdomen pelvis.   LABS:    Latest Reference Range & Units 09/24/20 13:59 11/23/20 17:30 02/06/21 14:14  CEA 0.0 - 4.7 ng/mL 7.0 (H) 7.5 (H) 9.2 (H)    ASSESSMENT & PLAN:  A 76 y.o. 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  She also has a history of stage IIIA (T2 N1 M0) colon cancer, status post a right hemicolectomy in June 2022.  In clinic today, I went over all of her PET scan images with her.  Although areas of hypermetabolic activity could be seen, they are very small.  In fact, these areas are too small to even consider biopsying.  None of  these lesions should possess the ability to cause increased morbidity/mortality over these next few months.  For now, her CEA level will continue to be followed.  I will consider repeating additional scans in the future if there is another significant rise in her CEA level to where radiographic reassessment needs to be considered.  I will see her back in 4 months for repeat clinical assessment.  The patient understands all the plans discussed today and is in agreement with them.   I, Rita Ohara, am acting as scribe for Marice Potter, MD    I have reviewed this report as typed by the medical scribe, and it is complete and accurate.  Dorien Bessent Macarthur Critchley, MD

## 2021-02-26 ENCOUNTER — Other Ambulatory Visit: Payer: Self-pay

## 2021-02-26 ENCOUNTER — Inpatient Hospital Stay (INDEPENDENT_AMBULATORY_CARE_PROVIDER_SITE_OTHER): Payer: Medicare PPO | Admitting: Oncology

## 2021-02-26 ENCOUNTER — Other Ambulatory Visit: Payer: Self-pay | Admitting: Oncology

## 2021-02-26 VITALS — BP 191/88 | HR 99 | Temp 98.3°F | Resp 14 | Ht 60.0 in | Wt 110.3 lb

## 2021-02-26 DIAGNOSIS — C182 Malignant neoplasm of ascending colon: Secondary | ICD-10-CM | POA: Diagnosis not present

## 2021-02-26 DIAGNOSIS — C3432 Malignant neoplasm of lower lobe, left bronchus or lung: Secondary | ICD-10-CM

## 2021-02-26 NOTE — Progress Notes (Signed)
Patien't daughter called prior to visit to advise of dark stools.

## 2021-03-06 ENCOUNTER — Ambulatory Visit: Payer: Medicare PPO | Admitting: Radiation Oncology

## 2021-03-13 ENCOUNTER — Telehealth: Payer: Self-pay | Admitting: *Deleted

## 2021-03-13 NOTE — Telephone Encounter (Signed)
CALLED PATIENT TO ALTER FU APPT. ON 04-11-21 DUE TO DR. KINARD BEING OFF, RESCHEDULED FOR 04-22-21 @ 11:15 AM, SPOKE WITH PATIENT'S DAUGHTER- CHRISTY Ibsen AND SHE IS AWARE OF THIS APPT.

## 2021-03-21 ENCOUNTER — Telehealth: Payer: Self-pay | Admitting: *Deleted

## 2021-03-21 NOTE — Telephone Encounter (Signed)
Returned patient's phone call, spoke with patient's daughter- Reubin [place]

## 2021-04-08 ENCOUNTER — Other Ambulatory Visit: Payer: Self-pay

## 2021-04-08 ENCOUNTER — Ambulatory Visit (HOSPITAL_COMMUNITY)
Admission: RE | Admit: 2021-04-08 | Discharge: 2021-04-08 | Disposition: A | Discharge status: Home / Self Care | Payer: Medicare PPO | Source: Ambulatory Visit | Attending: Radiation Oncology | Admitting: Radiation Oncology

## 2021-04-08 DIAGNOSIS — C3432 Malignant neoplasm of lower lobe, left bronchus or lung: Secondary | ICD-10-CM

## 2021-04-09 ENCOUNTER — Other Ambulatory Visit: Payer: Self-pay

## 2021-04-09 DIAGNOSIS — C3492 Malignant neoplasm of unspecified part of left bronchus or lung: Secondary | ICD-10-CM

## 2021-04-09 DIAGNOSIS — E119 Type 2 diabetes mellitus without complications: Secondary | ICD-10-CM

## 2021-04-09 DIAGNOSIS — J449 Chronic obstructive pulmonary disease, unspecified: Secondary | ICD-10-CM

## 2021-04-09 DIAGNOSIS — Z923 Personal history of irradiation: Secondary | ICD-10-CM | POA: Insufficient documentation

## 2021-04-09 DIAGNOSIS — G8929 Other chronic pain: Secondary | ICD-10-CM

## 2021-04-09 DIAGNOSIS — D518 Other vitamin B12 deficiency anemias: Secondary | ICD-10-CM

## 2021-04-09 DIAGNOSIS — E559 Vitamin D deficiency, unspecified: Secondary | ICD-10-CM | POA: Insufficient documentation

## 2021-04-09 DIAGNOSIS — C182 Malignant neoplasm of ascending colon: Secondary | ICD-10-CM

## 2021-04-09 DIAGNOSIS — E039 Hypothyroidism, unspecified: Secondary | ICD-10-CM

## 2021-04-09 DIAGNOSIS — I2510 Atherosclerotic heart disease of native coronary artery without angina pectoris: Secondary | ICD-10-CM | POA: Insufficient documentation

## 2021-04-09 DIAGNOSIS — D649 Anemia, unspecified: Secondary | ICD-10-CM

## 2021-04-09 HISTORY — DX: Vitamin D deficiency, unspecified: E55.9

## 2021-04-09 HISTORY — DX: Malignant neoplasm of ascending colon: C18.2

## 2021-04-09 HISTORY — DX: Chronic obstructive pulmonary disease, unspecified: J44.9

## 2021-04-09 HISTORY — DX: Atherosclerotic heart disease of native coronary artery without angina pectoris: I25.10

## 2021-04-09 HISTORY — DX: Malignant neoplasm of unspecified part of left bronchus or lung: C34.92

## 2021-04-09 HISTORY — DX: Hypothyroidism, unspecified: E03.9

## 2021-04-09 HISTORY — DX: Type 2 diabetes mellitus without complications: E11.9

## 2021-04-09 HISTORY — DX: Anemia, unspecified: D64.9

## 2021-04-09 HISTORY — DX: Other chronic pain: G89.29

## 2021-04-09 HISTORY — DX: Other vitamin B12 deficiency anemias: D51.8

## 2021-04-10 ENCOUNTER — Other Ambulatory Visit: Payer: Self-pay

## 2021-04-10 ENCOUNTER — Encounter: Payer: Self-pay | Admitting: Cardiology

## 2021-04-10 ENCOUNTER — Ambulatory Visit (INDEPENDENT_AMBULATORY_CARE_PROVIDER_SITE_OTHER): Payer: Medicare PPO | Admitting: Cardiology

## 2021-04-10 VITALS — BP 142/70 | HR 86 | Ht 60.0 in | Wt 116.0 lb

## 2021-04-10 DIAGNOSIS — I2510 Atherosclerotic heart disease of native coronary artery without angina pectoris: Secondary | ICD-10-CM

## 2021-04-10 DIAGNOSIS — I480 Paroxysmal atrial fibrillation: Secondary | ICD-10-CM | POA: Diagnosis not present

## 2021-04-10 DIAGNOSIS — I10 Essential (primary) hypertension: Secondary | ICD-10-CM | POA: Diagnosis not present

## 2021-04-10 DIAGNOSIS — E782 Mixed hyperlipidemia: Secondary | ICD-10-CM | POA: Diagnosis not present

## 2021-04-10 DIAGNOSIS — Z86711 Personal history of pulmonary embolism: Secondary | ICD-10-CM

## 2021-04-10 NOTE — Patient Instructions (Signed)

## 2021-04-10 NOTE — Progress Notes (Signed)
Cardiology Office Note:    Date:  04/10/2021   ID:  Desiree [name], [place] April 12, 1945, MRN [id number]  PCP:  [name], MD  Cardiologist:  Garwin Brothers, MD   Referring MD: Toma Deiters, NP    ASSESSMENT:    1. Atherosclerosis of native coronary artery of native heart without angina pectoris   2. Essential hypertension   3. Mild CAD   4. Mixed hyperlipidemia   5. Paroxysmal atrial fibrillation (HCC)   6. History of pulmonary embolus (PE)    PLAN:    In order of problems listed above:  Primary prevention stressed with the patient.  Importance of compliance with diet medication stressed and she vocalized understanding. Essential hypertension: Stable blood pressure and diet was emphasized.  Lifestyle modification urged. Mixed dyslipidemia: On statin therapy and followed by primary care. Paroxysmal atrial fibrillation:I discussed with the patient atrial fibrillation, disease process. Management and therapy including rate and rhythm control, anticoagulation benefits and potential risks were discussed extensively with the patient. Patient had multiple questions which were answered to patient's satisfaction.  She she is on low-dose anticoagulation because of history of bleeding and I will leave this up to her hematologist and primary care.  She has had history of pulmonary embolism also.  She gets blood work by her primary care at the facility and also hematologist and recently was told that her blood work is stable by her hematologist. Patient will be seen in follow-up appointment in 6 months or earlier if the patient has any concerns    Medication Adjustments/Labs and Tests Ordered: Current medicines are reviewed at length with the patient today.  Concerns regarding medicines are outlined above.  No orders of the defined types were placed in this encounter.  No orders of the defined types were placed in this encounter.    No chief complaint on file.    History of  Present Illness:    Desiree [name] is a 76 y.o. female.  Patient has past medical history of paroxysmal atrial fibrillation, essential hypertension, dyslipidemia and history of pulmonary embolism.  She denies any problems at this time and takes care of activities of daily living.  No chest pain orthopnea or PND.  She lives in assisted living facility.  At the time of my evaluation, the patient is alert awake oriented and in no distress.  He is on low-dose anticoagulation because history of iron deficiency anemia and is followed by hematologist.  Past Medical History:  Diagnosis Date   Abnormal nuclear stress test    AF (paroxysmal atrial fibrillation) (HCC)    Age-related osteoporosis without current pathological fracture 05/16/2016   Alcoholism (HCC) 10/21/2018   Formatting of this note might be different from the original. 2 glasses a day.   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 Wrist surgery   Benign paroxysmal positional vertigo due to bilateral vestibular disorder 02/09/2020   Cancer of ascending colon [facility])    Carcinoma of ascending colon (HCC)    Cat allergies    Centrilobular emphysema (HCC) 05/16/2016   Chronic anemia 04/09/2021   Chronic atrial fibrillation, unspecified (HCC) 10/04/2020   Chronic obstructive pulmonary disease (HCC) 04/09/2021   Chronic pain 04/09/2021   CKD (chronic kidney disease) stage 3, GFR 30-59 ml/min (HCC) 01/04/2018   COPD (chronic obstructive pulmonary disease) (HCC)  Cysts of both ovaries 11/24/2019   Formatting of this note might be different from the original. On CT 11/2019. Referred to GYN.   Dyspnea    Early satiety 04/14/2019   Essential hypertension 08/22/2010   Formatting of this note might be different from the original. Formatting of this note might be different from the original.  D/C ACE 08/22/2010 due to concerns with freq aecopd/ pseudoasthma > resolved  Last Assessment & Plan:  Formatting of this note might be different from the original. Adequate control on present rx, reviewed - note she's no longer falling apart during her aecopd's which are less   Gastro-esophageal reflux disease without esophagitis 07/14/2017   Gastrointestinal hemorrhage    GERD without esophagitis 07/14/2017   History of pulmonary embolus (PE) 07/14/2017   History of radiation therapy    Left lung- SBRT 11/13/20-11/27/20- Dr. 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   Lung cancer, lower lobe (HCC) 09/24/2020   Lung nodule, solitary 08/16/2020   Added automatically from request for surgery [id number]   [facility] and fatigue 07/14/2017   Malignant neoplasm of unspecified part of left bronchus or lung (HCC) 04/09/2021   Malignant tumor of ascending colon (HCC) 04/09/2021   Mild CAD 02/02/2019   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 Possible???? See CT comments 11/2019.   Other vitamin B12 deficiency anemias 04/09/2021   Paroxysmal atrial fibrillation (HCC) 04/14/2019   Formatting of this note might be different from the original. [facility].   Prediabetes 07/14/2017   Pulmonary embolism (HCC) 2017   Rheumatoid arthritis (HCC) 09/20/2010   Dr Nickola Major in [place].   Rheumatoid arthritis(714.0)    Dr Azzie Roup follows   Schatzki's ring    Senile purpura (HCC) 01/04/2018   Skin rash 07/14/2017   Bx'd Allergic. Likely meds.   Status post thoracentesis    Symptomatic anemia  07/25/2020   Type 2 diabetes mellitus without complications (HCC) 04/09/2021   Vitamin B12 deficiency 07/14/2017   Vitamin D deficiency 04/09/2021    Past Surgical History:  Procedure Laterality Date   BIOPSY  07/27/2020   Procedure: BIOPSY;  Surgeon: Iva Boop, MD;  Location: [facility] ENDOSCOPY;  Service: Endoscopy;;   BRONCHIAL BIOPSY  09/11/2020   Procedure: BRONCHIAL BIOPSIES;  Surgeon: Josephine Igo, DO;  Location: [facility] ENDOSCOPY;  Service: Pulmonary;;   BRONCHIAL BRUSHINGS  09/11/2020   Procedure: BRONCHIAL BRUSHINGS;  Surgeon: Josephine Igo, DO;  Location: [facility] ENDOSCOPY;  Service: Pulmonary;;   BRONCHIAL NEEDLE ASPIRATION BIOPSY  09/11/2020   Procedure: BRONCHIAL NEEDLE ASPIRATION BIOPSIES;  Surgeon: Josephine Igo, DO;  Location: [facility] ENDOSCOPY;  Service: Pulmonary;;   BRONCHIAL WASHINGS  09/11/2020   Procedure: BRONCHIAL WASHINGS;  Surgeon: Josephine Igo, DO;  Location: [facility] ENDOSCOPY;  Service: Pulmonary;;   COLONOSCOPY N/A 07/27/2020   Procedure: COLONOSCOPY;  Surgeon: Iva Boop, MD;  Location: [facility] ENDOSCOPY;  Service: Endoscopy;  Laterality: N/A;   CYSTECTOMY Right    breast   ESOPHAGOGASTRODUODENOSCOPY (EGD) WITH PROPOFOL N/A 07/27/2020   Procedure: ESOPHAGOGASTRODUODENOSCOPY (EGD) WITH PROPOFOL;  Surgeon: Stan Head  E, MD;  Location: [facility] ENDOSCOPY;  Service: Endoscopy;  Laterality: N/A;   FIDUCIAL MARKER PLACEMENT  09/11/2020   Procedure: FIDUCIAL MARKER PLACEMENT;  Surgeon: Josephine Igo, DO;  Location: [facility] ENDOSCOPY;  Service: Pulmonary;;   LAPAROSCOPIC RIGHT HEMI COLECTOMY Right 07/31/2020   Procedure: LAPAROSCOPIC ASSISSTED  RIGHT HEMI COLECTOMY;  Surgeon: Manus Rudd, MD;  Location: [facility] OR;  Service: General;  Laterality: Right;   LEFT HEART CATH AND CORONARY ANGIOGRAPHY N/A 01/05/2019   Procedure: LEFT HEART CATH AND CORONARY ANGIOGRAPHY;  Surgeon: Corky Crafts, MD;  Location: [facility] INVASIVE CV LAB;  Service: Cardiovascular;  Laterality: N/A;   SUBMUCOSAL TATTOO  INJECTION  07/27/2020   Procedure: SUBMUCOSAL TATTOO INJECTION;  Surgeon: Iva Boop, MD;  Location: [facility] ENDOSCOPY;  Service: Endoscopy;;   VIDEO BRONCHOSCOPY WITH ENDOBRONCHIAL NAVIGATION Left 09/11/2020   Procedure: VIDEO BRONCHOSCOPY WITH ENDOBRONCHIAL NAVIGATION;  Surgeon: Josephine Igo, DO;  Location: [facility] ENDOSCOPY;  Service: Pulmonary;  Laterality: Left;  ION   VIDEO BRONCHOSCOPY WITH RADIAL ENDOBRONCHIAL ULTRASOUND  09/11/2020   Procedure: RADIAL ENDOBRONCHIAL ULTRASOUND;  Surgeon: Josephine Igo, DO;  Location: [facility] ENDOSCOPY;  Service: Pulmonary;;    Current Medications: Current Meds  Medication Sig   acetaminophen (TYLENOL) 500 MG tablet Take 500 mg by mouth every 6 (six) hours as needed for moderate pain or headache.   albuterol (VENTOLIN HFA) 108 (90 Base) MCG/ACT inhaler Inhale 2 puffs into the lungs every 4 (four) hours as needed for shortness of breath (shortness of breath (related to COPD)).   amLODipine (NORVASC) 5 MG tablet Take 1 tablet (5 mg total) by mouth daily. Patient needs appointment for further refills. 1 st attempt   atorvastatin (LIPITOR) 20 MG tablet Take 1 tablet (20 mg total) by mouth daily. Patient needs to keep appointment for further refills. 1 st attempt   Budeson-Glycopyrrol-Formoterol (BREZTRI AEROSPHERE) 160-9-4.8 MCG/ACT AERO Inhale 2 puffs into the lungs 2 (two) times daily.   carvedilol (COREG) 3.125 MG tablet Take 1 tablet (3.125 mg total) by mouth 2 (two) times daily with a meal.   Cholecalciferol (D3-1000) 25 MCG (1000 UT) capsule Take 1,000 Units by mouth daily.   diclofenac Sodium (VOLTAREN) 1 % GEL Apply 1 application. topically as needed (pain).   ferrous sulfate 325 (65 FE) MG EC tablet Take 325 mg by mouth daily with breakfast.   fexofenadine (ALLEGRA) 180 MG tablet Take 180 mg by mouth daily.   hydroxychloroquine (PLAQUENIL) 200 MG tablet Take 200 mg by mouth daily.   leflunomide (ARAVA) 10 MG tablet Take 10 mg by mouth daily.   leflunomide  (ARAVA) 20 MG tablet Take 20 mg by mouth daily.   loperamide (IMODIUM A-D) 2 MG tablet Take 4 mg by mouth as needed for diarrhea or loose stools (MAX 10 PILLS/24 HRS.).   montelukast (SINGULAIR) 10 MG tablet Take 1 tablet (10 mg total) by mouth at bedtime.   omeprazole (PRILOSEC) 20 MG capsule Take 1 capsule (20 mg total) by mouth daily.   ondansetron (ZOFRAN-ODT) 4 MG disintegrating tablet Take 1 tablet (4 mg total) by mouth every 8 (eight) hours as needed for nausea or vomiting.   predniSONE (DELTASONE) 5 MG tablet Take 5 mg by mouth daily with breakfast.   sertraline (ZOLOFT) 100 MG tablet Take 100 mg by mouth daily.    traMADol (ULTRAM) 50 MG tablet Take 50 mg by mouth every 6 (six) hours as needed for pain (pain).   valsartan (DIOVAN) 320 MG tablet Take 320 mg by  mouth in the morning.   vitamin B-12 (CYANOCOBALAMIN) 1000 MCG tablet Take 1,000 mcg by mouth daily.      Allergies:   Other   Social History   Socioeconomic History   Marital status: Widowed    Spouse name: Not on file   Number of children: Not on file   Years of education: Not on file   Highest education level: Not on file  Occupational History   Occupation: retired  Tobacco Use   Smoking status: Former    Packs/day: 1.00    Years: 40.00    Pack years: 40.00    Types: Cigarettes    Quit date: 02/03/2001    Years since quitting: 20.1   Smokeless tobacco: Never  Vaping Use   Vaping Use: Never used  Substance and Sexual Activity   Alcohol use: Yes    Alcohol/week: 2.0 standard drinks    Types: 2 Standard drinks or equivalent per week    Comment: 2 drinks daily   Drug use: No   Sexual activity: Not on file  Other Topics Concern   Not on file  Social History Narrative   Tobacco use, amount per day now: NONE   Past tobacco use, amount per day: 1 PACK PER DAY   How many years did you use tobacco:TOO MANY STOPPED IN 2003   Alcohol use (drinks per week): 14   Diet: GOOD   Do you drink/eat things with caffeine:  YES   Marital status: WIDOW                What year were you married? 1967   Do you live in a house, apartment, assisted living, condo, trailer, etc.? HOUSE   Is it one or more stories? SPLIT LEVEL   How many persons live in your home? 1   Do you have pets in your home?( please list) NONE   Current or past profession: PAST RECORDS TECHNICIAN AT [employer]   Do you exercise?  NO                                Type and how often?   Do you have a living will? YES    Do you have a DNR form?  YES                                 If not, do you want to discuss one?   Do you have signed POA/HPOA forms?        ??              If so, please bring to you appointment      [facility] (05/16/16):   Originally from [place], [place]. 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 No pets currently. No bird, mold, or hot tub exposure.    Social Determinants of Health   Financial Resource Strain: Low Risk    Difficulty of Paying Living Expenses: Not very hard  Food Insecurity: No Food Insecurity   Worried About Programme researcher, broadcasting/film/video in the Last Year: Never true   Ran Out of Food in the Last Year: Never true  Transportation Needs: Unmet Transportation Needs   Lack of Transportation (Medical): Yes   Lack of Transportation (Non-Medical): No  Physical Activity: Not on file  Stress: Not on file  Social  Connections: Socially Isolated   Frequency of Communication with Friends and Family: Twice a week   Frequency of Social Gatherings with Friends and Family: Never   Attends Religious Services: Never   Database administrator or Organizations: No   Attends Banker Meetings: Never   Marital Status: Widowed     Family History: The patient's family history includes Breast cancer in her maternal aunt; Clotting disorder in her mother; Emphysema in her paternal grandmother and sister; Heart disease in her father and sister; Sleep apnea in her sister; Stroke in  her mother.  ROS:   Please see the history of present illness.    All other systems reviewed and are negative.  EKGs/Labs/Other Studies Reviewed:    The following studies were reviewed today: I discussed my findings with the patient at length   Recent Labs: 07/16/2020: B Natriuretic Peptide 115.9 08/05/2020: Magnesium 1.6 09/24/2020: ALT 15; BUN 13; Creatinine 0.7; Hemoglobin 8.4; Platelets 263; Potassium 3.7; Sodium 132  Recent Lipid Panel No results found for: CHOL, TRIG, HDL, CHOLHDL, VLDL, LDLCALC, LDLDIRECT  Physical Exam:    VS:  BP (!) 142/70   Pulse 86   Ht 5' (1.524 m)   Wt 116 lb (52.6 kg)   SpO2 97%   BMI 22.65 kg/m     Wt Readings from Last 3 Encounters:  04/10/21 116 lb (52.6 kg)  02/26/21 110 lb 4.8 oz (50 kg)  02/11/21 110 lb 6.4 oz (50.1 kg)     GEN: Patient is in no acute distress HEENT: Normal NECK: No JVD; No carotid bruits LYMPHATICS: No lymphadenopathy CARDIAC: Hear sounds regular, 2/6 systolic murmur at the apex. RESPIRATORY:  Clear to auscultation without rales, wheezing or rhonchi  ABDOMEN: Soft, non-tender, non-distended MUSCULOSKELETAL:  No edema; No deformity  SKIN: Warm and dry NEUROLOGIC:  Alert and oriented x 3 PSYCHIATRIC:  Normal affect   Signed, Garwin Brothers, MD  04/10/2021 2:15 PM    [facility]

## 2021-04-11 ENCOUNTER — Ambulatory Visit: Payer: Self-pay | Admitting: Radiation Oncology

## 2021-04-21 NOTE — Progress Notes (Signed)
?Radiation Oncology         [phone] ?________________________________ ? ?Name: Desiree [name] MRN: [id number]  ?Date: 04/22/2021  DOB: 1945/09/23 ? ?Follow-Up Visit Note ? ?CC: Raina Mina., MD  Marice Potter, MD ? ?  ICD-10-CM   ?1. 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  ? ? ?Diagnosis: The primary encounter diagnosis was Malignant neoplasm of lower lobe of left lung ([place]). A diagnosis of Cancer of ascending colon [facility]) was also pertinent to this visit. ?  ?Stage IIIA (pT2, pN1a, cM0) cancer of ascending colon; status post right hemicolectomy in June 2022,  ? 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 ?Interval Since Last Radiation: 4 months, and 23 days  ? ?Intent: Curative ? ?Radiation Treatment Dates: 11/13/2020 through 11/27/2020 ?Site Technique Total Dose (Gy) Dose per Fx (Gy) Completed Fx Beam Energies  ?Lung, Left: Lung_Lt SBRT 50/50 10 5/5 6XFFF  ?Lung, Left: Lung_Lt_Lung Lt SBRT 54/54 18 3/3 6XFFF  ? ? ?Narrative:  The patient returns today for routine follow-up and to review recent imaging, she was last seen here for follow up on 01/07/21.  Since her last visit,  the patient followed up with Dr. Bobby Rumpf on 02/11/21 ([facility]). During which time, the patient denied any GI symptoms concerning for early disease recurrence, and denied any new respiratory symptoms pertaining to her lung cancer. PET imaging performed on 09/04/20 was reviewed with the patient which showed a hypermetabolic focus deep in the mesentery. Given findings on PET and her continuously risking CEA levels, Dr. Bobby Rumpf recommended repeat PET imaging to rule out disease recurrence. ? ?Subsequent repeat PET (unknown date) per Dr. Bobby Rumpf, showed very small areas of hypermetabolic activity. Dr. Bobby Rumpf noted that these areas were too small to even consider tissues sampling. For now, her CEA level will continued to be followed by Dr Bobby Rumpf, who will consider  repeat imaging in the future if there is another significant rise in her CEA level.      ? ?Her most recent chest CT on 04/08/21 showed the posterior left lower lobe pulmonary nodule as similar to minimally decreased in size in the interval. CT also showed widespread new patchy tree-in-bud opacities, scattered foci of new clustered solid pulmonary nodularities in both lungs, and new dominant 0.6 cm solid nodules in the posterior upper lobes bilaterally. Findings were noted as indeterminate and potentially infectious/inflammatory in etiology, though metastatic disease could not be excluded.      ? ?Patient continues on 3 L of oxygen.  She has dyspnea on exertion but reports no changes in her breathing.  She denies any significant cough or hemoptysis.  She continues to live in assisted living down in [place].  She is accompanied by her caregiver today.         ? ?Allergies:  is allergic to other. ? ?Meds: ?Current Outpatient Medications  ?Medication Sig Dispense Refill  ? acetaminophen (TYLENOL) 500 MG tablet Take 500 mg by mouth every 6 (six) hours as needed for moderate pain or headache.    ? albuterol (VENTOLIN HFA) 108 (90 Base) MCG/ACT inhaler Inhale 2 puffs into the lungs every 4 (four) hours as needed for shortness of breath (shortness of breath (related to COPD)).    ? amLODipine (NORVASC) 5 MG tablet Take 1 tablet (5 mg total) by mouth daily. Patient needs appointment for further refills. 1 st attempt 30 tablet 0  ?  atorvastatin (LIPITOR) 20 MG tablet Take 1 tablet (20 mg total) by mouth daily. Patient needs to keep appointment for further refills. 1 st attempt 30 tablet 0  ? Budeson-Glycopyrrol-Formoterol (BREZTRI AEROSPHERE) 160-9-4.8 MCG/ACT AERO Inhale 2 puffs into the lungs 2 (two) times daily. 10.7 g 4  ? carvedilol (COREG) 3.125 MG tablet Take 1 tablet (3.125 mg total) by mouth 2 (two) times daily with a meal. 60 tablet 0  ? Cholecalciferol (D3-1000) 25 MCG (1000 UT) capsule Take 1,000 Units by mouth  daily.    ? diclofenac Sodium (VOLTAREN) 1 % GEL Apply 1 application. topically as needed (pain).    ? ferrous sulfate 325 (65 FE) MG EC tablet Take 325 mg by mouth daily with breakfast.    ? fexofenadine (ALLEGRA) 180 MG tablet Take 180 mg by mouth daily.    ? hydroxychloroquine (PLAQUENIL) 200 MG tablet Take 200 mg by mouth daily.  3  ? leflunomide (ARAVA) 10 MG tablet Take 10 mg by mouth daily.    ? leflunomide (ARAVA) 20 MG tablet Take 20 mg by mouth daily.    ? loperamide (IMODIUM A-D) 2 MG tablet Take 4 mg by mouth as needed for diarrhea or loose stools (MAX 10 PILLS/24 HRS.).    ? montelukast (SINGULAIR) 10 MG tablet Take 1 tablet (10 mg total) by mouth at bedtime. 90 tablet 1  ? omeprazole (PRILOSEC) 20 MG capsule Take 1 capsule (20 mg total) by mouth daily. 90 capsule 1  ? ondansetron (ZOFRAN-ODT) 4 MG disintegrating tablet Take 1 tablet (4 mg total) by mouth every 8 (eight) hours as needed for nausea or vomiting. 10 tablet 0  ? predniSONE (DELTASONE) 5 MG tablet Take 5 mg by mouth daily with breakfast.    ? sertraline (ZOLOFT) 100 MG tablet Take 100 mg by mouth daily.     ? traMADol (ULTRAM) 50 MG tablet Take 50 mg by mouth every 6 (six) hours as needed for pain (pain).    ? valsartan (DIOVAN) 320 MG tablet Take 320 mg by mouth in the morning.    ? vitamin B-12 (CYANOCOBALAMIN) 1000 MCG tablet Take 1,000 mcg by mouth daily.     ? ?No current facility-administered medications for this encounter.  ? ? ?Physical Findings: ?The patient is in no acute distress. Patient is alert and oriented.  She is able to ambulate with the assistance of a walker.  Supplemental oxygen in place at 3 L. ? height is 5' (1.524 m) and weight is 114 lb 3.2 oz (51.8 kg). Her temperature is 97.8 ?F (36.6 ?C). Her blood pressure is 144/73 (abnormal) and her pulse is 98. Her respiration is 24 (abnormal) and oxygen saturation is 97%. .  . Lungs are clear to auscultation bilaterally. Heart has regular rate and rhythm. No palpable  cervical, supraclavicular, or axillary adenopathy. Abdomen soft, non-tender, normal bowel sounds.  ? ? ?Lab Findings: ?Lab Results  ?Component Value Date  ? WBC 6.5 09/24/2020  ? HGB 8.4 (A) 09/24/2020  ? HCT 26 (A) 09/24/2020  ? MCV 91.7 08/05/2020  ? PLT 263 09/24/2020  ? ? ?Radiographic Findings: ?CT CHEST WO CONTRAST ? ?Result Date: 04/09/2021 ?CLINICAL DATA:  History of stage IA2 left lower lobe squamous cell lung cancer and stage IIIA ascending colon cancer. Interval radiation therapy to the left lower lobe pulmonary nodule completed 11/27/2020. Restaging. EXAM: CT CHEST WITHOUT CONTRAST TECHNIQUE: Multidetector CT imaging of the chest was performed following the standard protocol without IV contrast. RADIATION DOSE REDUCTION: This  exam was performed according to the departmental dose-optimization program which includes automated exposure control, adjustment of the mA and/or kV according to patient size and/or use of iterative reconstruction technique. * onc * COMPARISON:  09/04/2020 chest CT and PET-CT. FINDINGS: Cardiovascular: Normal heart size. Stable trace pericardial effusion/thickening. Three-vessel coronary atherosclerosis. Atherosclerotic nonaneurysmal thoracic aorta. Normal caliber pulmonary arteries. Mediastinum/Nodes: No discrete thyroid nodules. Unremarkable esophagus. No pathologically enlarged axillary, mediastinal or hilar lymph nodes, noting limited sensitivity for the detection of hilar adenopathy on this noncontrast study. Lungs/Pleura: No pneumothorax. No pleural effusion. Moderate centrilobular emphysema with diffuse bronchial wall thickening. Solid posterior left lower lobe 1.6 x 1.0 cm pulmonary nodule (series 5/image 83), previously 1.8 x 1.0 cm, similar to minimally decreased. Widespread new patchy tree-in-bud opacities throughout the lungs bilaterally, most prominent in the right lower lobe. Several new clustered solid pulmonary nodules scattered in both lungs, for example 0.6 cm in the  posterior left upper lobe (series 5/image 34) and 0.6 cm in the posterior right upper lobe (series 5/image 59). Upper abdomen: A few scattered subcentimeter hypodense liver lesions are too small to characteri

## 2021-04-22 ENCOUNTER — Ambulatory Visit
Admission: RE | Admit: 2021-04-22 | Discharge: 2021-04-22 | Disposition: A | Discharge status: Home / Self Care | Payer: Medicare PPO | Source: Ambulatory Visit | Attending: Radiation Oncology | Admitting: Radiation Oncology

## 2021-04-22 ENCOUNTER — Other Ambulatory Visit: Payer: Self-pay

## 2021-04-22 ENCOUNTER — Encounter: Payer: Self-pay | Admitting: Radiation Oncology

## 2021-04-22 DIAGNOSIS — Z923 Personal history of irradiation: Secondary | ICD-10-CM | POA: Diagnosis not present

## 2021-04-22 DIAGNOSIS — C182 Malignant neoplasm of ascending colon: Secondary | ICD-10-CM | POA: Insufficient documentation

## 2021-04-22 DIAGNOSIS — Z79899 Other long term (current) drug therapy: Secondary | ICD-10-CM | POA: Insufficient documentation

## 2021-04-22 DIAGNOSIS — Z791 Long term (current) use of non-steroidal anti-inflammatories (NSAID): Secondary | ICD-10-CM | POA: Insufficient documentation

## 2021-04-22 DIAGNOSIS — I3139 Other pericardial effusion (noninflammatory): Secondary | ICD-10-CM | POA: Diagnosis not present

## 2021-04-22 DIAGNOSIS — C3432 Malignant neoplasm of lower lobe, left bronchus or lung: Secondary | ICD-10-CM | POA: Insufficient documentation

## 2021-04-22 DIAGNOSIS — J432 Centrilobular emphysema: Secondary | ICD-10-CM | POA: Diagnosis not present

## 2021-04-22 DIAGNOSIS — K769 Liver disease, unspecified: Secondary | ICD-10-CM | POA: Diagnosis not present

## 2021-04-22 DIAGNOSIS — M47814 Spondylosis without myelopathy or radiculopathy, thoracic region: Secondary | ICD-10-CM | POA: Diagnosis not present

## 2021-04-22 NOTE — Progress Notes (Signed)
Desiree [name] is here today for follow up post radiation to the lung. ? ?Lung Side: Left, patient completed treatment on 11/27/20 ? ?Does the patient complain of any of the following: ?Pain: Patient denies pain.  ?Shortness of breath w/wo exertion:  Yes, on exertion. Patient currently on oxygen at 3 L via  nasal canula.  ?Cough: no ?Hemoptysis: no ?Pain with swallowing: no ?Swallowing/choking concerns: no ?Appetite: good ?Weight:  ?Wt Readings from Last 3 Encounters:  ?04/22/21 114 lb 3.2 oz (51.8 kg)  ?04/10/21 116 lb (52.6 kg)  ?02/26/21 110 lb 4.8 oz (50 kg)  ?  ?Energy Level: continues to have fatigue.  ?Post radiation skin Changes: no ? ? ? ?Additional comments if applicable:  ? ?Vitals:  ? 04/22/21 1132  ?BP: (!) 144/73  ?Pulse: 98  ?Resp: (!) 24  ?Temp: 97.8 ?F (36.6 ?C)  ?SpO2: 97%  ?Weight: 114 lb 3.2 oz (51.8 kg)  ?Height: 5' (1.524 m)  ?PF: (!) 3 L/min  ?  ?

## 2021-06-24 ENCOUNTER — Inpatient Hospital Stay: Payer: Medicare PPO | Attending: Oncology

## 2021-06-24 DIAGNOSIS — C182 Malignant neoplasm of ascending colon: Secondary | ICD-10-CM | POA: Diagnosis not present

## 2021-06-24 DIAGNOSIS — C3432 Malignant neoplasm of lower lobe, left bronchus or lung: Secondary | ICD-10-CM | POA: Diagnosis not present

## 2021-06-25 LAB — CEA: CEA: 9.4 ng/mL — ABNORMAL HIGH (ref 0.0–4.7)

## 2021-06-25 NOTE — Progress Notes (Signed)
[facility]  [address] [place],  [place]  [address] [phone]  Clinic Day:  06/26/2021  Referring physician: No ref. provider found  HISTORY OF PRESENT ILLNESS:  The patient is a 76 y.o. 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  Due to her poor baseline health, adjuvant chemotherapy was not entertained.  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  The patient completed stereotactic radiation for her lung cancer in October 2022.  The patient comes in today to review her clinical picture.  In the past, a PET scan done in January 2023 showed 2 small bilateral lung nodules that were mildly hypermetabolic.  However, based upon their small size, they could not be biopsied.  She comes in today to reassess her CEA level, which is being followed each visit.  Since her last visit, the patient has been doing okay.  She has chronic shortness of breath for which she uses 3 L of oxygen.  However, she denies having any new respiratory symptoms which concern her for recurrent lung cancer.  She also denies having any new GI symptoms that concern her for recurrent colon cancer.  PHYSICAL EXAM:  Blood pressure (!) 149/72, pulse 83, temperature 97.9 F (36.6 C), resp. rate 14, height 5' (1.524 m), SpO2 93 %. Wt Readings from Last 3 Encounters:  04/22/21 114 lb 3.2 oz (51.8 kg)  04/10/21 116 lb (52.6 kg)  02/26/21 110 lb 4.8 oz (50 kg)   Body mass index is 22.3 kg/m. Performance status (ECOG): 3 - Symptomatic, >50% confined to bed Physical Exam Constitutional:      Appearance: Normal appearance. She is ill-appearing.     Comments: Chronically-ill appearing woman ambulating with a walker, wearing oxygen per nasal canula  HENT:     Mouth/Throat:     Mouth: Mucous membranes are moist.     Pharynx: Oropharynx is clear. No oropharyngeal exudate or  posterior oropharyngeal erythema.  Cardiovascular:     Rate and Rhythm: Normal rate and regular rhythm.     Heart sounds: No murmur heard.   No friction rub. No gallop.  Pulmonary:     Effort: Pulmonary effort is normal. No respiratory distress.     Breath sounds: Normal breath sounds. No wheezing, rhonchi or rales.     Comments: Diffusely decreased breath sounds bilaterally Abdominal:     General: Bowel sounds are normal. There is no distension.     Palpations: Abdomen is soft. There is no mass.     Tenderness: There is no abdominal tenderness.  Musculoskeletal:        General: No swelling.     Right lower leg: No edema.     Left lower leg: No edema.  Lymphadenopathy:     Cervical: No cervical adenopathy.     Upper Body:     Right upper body: No supraclavicular or axillary adenopathy.     Left upper body: No supraclavicular or axillary adenopathy.     Lower Body: No right inguinal adenopathy. No left inguinal adenopathy.  Skin:    General: Skin is warm.     Coloration: Skin is not jaundiced.     Findings: No lesion or rash.  Neurological:     General: No focal deficit present.     Mental Status: She is alert and oriented to person, place, and time. Mental  status is at baseline.  Psychiatric:        Mood and Affect: Mood normal.        Behavior: Behavior normal.        Thought Content: Thought content normal.   LABS:    Latest Reference Range & Units Most Recent 02/06/21 14:14  CEA 0.0 - 4.7 ng/mL 9.4 (H) 06/24/21 10:56 9.2 (H)  (H): Data is abnormally high   ASSESSMENT & PLAN:  A 76 y.o. 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  She also has a history of stage IIIA (T2 N1 M0) colon cancer, status post a right hemicolectomy in June 2022.  When evaluating her most recent CEA level, it is not much different than what it has been previously.  Clinically, the patient appears to have not had any decline in  her health.  I will see her back in 4 months for repeat clinical assessment.  At that time, repeat CT scans of her chest/abdomen/pelvis will be done to ensure that her pulmonary nodules have not progressed in size, as well as to ensure there is no signs of disease recurrence in her abdomen and pelvis.  The patient understands all the plans discussed today and is in agreement with them.  Maekayla Giorgio Macarthur Critchley, MD

## 2021-06-26 ENCOUNTER — Inpatient Hospital Stay (INDEPENDENT_AMBULATORY_CARE_PROVIDER_SITE_OTHER): Payer: Medicare PPO | Admitting: Oncology

## 2021-06-26 ENCOUNTER — Telehealth: Payer: Self-pay | Admitting: Oncology

## 2021-06-26 ENCOUNTER — Other Ambulatory Visit: Payer: Self-pay | Admitting: Oncology

## 2021-06-26 VITALS — BP 149/72 | HR 83 | Temp 97.9°F | Resp 14 | Ht 60.0 in

## 2021-06-26 DIAGNOSIS — C3432 Malignant neoplasm of lower lobe, left bronchus or lung: Secondary | ICD-10-CM | POA: Diagnosis not present

## 2021-06-26 DIAGNOSIS — C182 Malignant neoplasm of ascending colon: Secondary | ICD-10-CM

## 2021-06-26 NOTE — Telephone Encounter (Signed)
Per 06/26/21 los next appt scheduled and confirmed with patient

## 2021-09-30 ENCOUNTER — Telehealth: Payer: Self-pay | Admitting: *Deleted

## 2021-09-30 NOTE — Telephone Encounter (Signed)
CALLED PATIENT TO INFORM OF CT FOR 10-23-21- ARRIVAL TIME- 1:15 PM @ [facility], NO RESTRICTIONS TO TEST, PATIENT TO RECEIVE RESULTS FROM DR. KINARD ON 10-28-21 @ 11:15 AM, SPOKE WITH PATIENT'S RESIDENCE- [facility] IN [place] - PHONE NUMBER [phone], SPOKE WITH [facility] IN  AND THEY WILL BRING HER AND THEY ARE AWARE OF THE APPTS. AND THE INSTRUCTIONS

## 2021-10-10 ENCOUNTER — Other Ambulatory Visit: Payer: Self-pay

## 2021-10-10 DIAGNOSIS — I480 Paroxysmal atrial fibrillation: Secondary | ICD-10-CM | POA: Insufficient documentation

## 2021-10-14 ENCOUNTER — Encounter: Payer: Self-pay | Admitting: Cardiology

## 2021-10-14 ENCOUNTER — Ambulatory Visit: Payer: Medicare PPO | Attending: Cardiology | Admitting: Cardiology

## 2021-10-14 VITALS — BP 136/62 | HR 79 | Ht 60.0 in | Wt 112.0 lb

## 2021-10-14 DIAGNOSIS — I10 Essential (primary) hypertension: Secondary | ICD-10-CM | POA: Diagnosis not present

## 2021-10-14 DIAGNOSIS — I2510 Atherosclerotic heart disease of native coronary artery without angina pectoris: Secondary | ICD-10-CM

## 2021-10-14 DIAGNOSIS — I480 Paroxysmal atrial fibrillation: Secondary | ICD-10-CM

## 2021-10-14 DIAGNOSIS — E782 Mixed hyperlipidemia: Secondary | ICD-10-CM | POA: Diagnosis not present

## 2021-10-14 NOTE — Patient Instructions (Signed)

## 2021-10-14 NOTE — Progress Notes (Signed)
Cardiology Office Note:    Date:  10/14/2021   ID:  Desiree [name], DOB 03/28/1945, MRN [id number]  PCP:  Raina Mina., MD  Cardiologist:  Jenean Lindau, MD   Referring MD: Raina Mina., MD    ASSESSMENT:    1. Atherosclerosis of native coronary artery of native heart without angina pectoris   2. Paroxysmal atrial fibrillation (HCC)   3. Essential hypertension   4. Mixed hyperlipidemia    PLAN:    In order of problems listed above:  Primary prevention stressed with the patient.  Importance of compliance with diet and medication stressed and she vocalized understanding. Paroxysmal atrial fibrillation: She mentions to me that she has had only one episode of paroxysmal atrial fibrillation.  Because of anemia colon cancer issues in the past and the fact she is not steady with her gait she is not considered a candidate for anticoagulation.  She is followed by hematology and has been in touch with them for these issues.  She has also had a history of pulmonary embolism. History of mild coronary artery disease: On statin therapy.  Asymptomatic.  Followed by primary care. Mixed dyslipidemia: On lipid-lowering medications followed by primary care. History of COPD on oxygen: She mentions to me that it is stable.  She is on a low-dose steroid therapy. She is seen in follow-up appointment on an annual basis.  She had multiple questions which were answered to her satisfaction.   Medication Adjustments/Labs and Tests Ordered: Current medicines are reviewed at length with the patient today.  Concerns regarding medicines are outlined above.  Orders Placed This Encounter  Procedures   EKG 12-Lead   No orders of the defined types were placed in this encounter.    No chief complaint on file.    History of Present Illness:    Desiree [name] is a 76 y.o. female.  Patient has a past medical history of paroxysmal atrial fibrillation, COPD, significant anemia followed by hematology  and history of pulmonary embolism.  She ambulates with a walker.  She has history of colon cancer.  At the time of my evaluation, the patient is alert awake oriented and in no distress.  Past Medical History:  Diagnosis Date   Abnormal nuclear stress test    AF (paroxysmal atrial fibrillation) (HCC)    Age-related osteoporosis without current pathological fracture 05/16/2016   Alcoholism ([place]) 10/21/2018   Formatting of this note might be different from the original. 2 glasses a day.   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 Wrist surgery   Benign paroxysmal positional vertigo due to bilateral vestibular disorder 02/09/2020   Cancer of ascending colon [facility])    Carcinoma of ascending colon (HCC)    Cat allergies    Centrilobular emphysema ([place]) 05/16/2016   Chronic anemia 04/09/2021   Chronic atrial fibrillation, unspecified ([place]) 10/04/2020   Chronic obstructive pulmonary disease ([place]) 04/09/2021   Chronic pain 04/09/2021   CKD (chronic kidney disease) stage 3, GFR 30-59 ml/min (HCC) 01/04/2018   COPD (chronic obstructive pulmonary disease) ([place])    Cysts of both ovaries 11/24/2019   Formatting of this note might be different from the original. On CT 11/2019. Referred to GYN.   Dyspnea    Early satiety 04/14/2019   Essential hypertension 08/22/2010   Formatting of this note might be  different from the original. Formatting of this note might be different from the original. D/C ACE 08/22/2010 due to concerns with freq aecopd/ pseudoasthma > resolved  Last Assessment & Plan:  Formatting of this note might be different from the original. Adequate control on present rx, reviewed - note she's no longer falling apart during her aecopd's which are less   Gastro-esophageal reflux disease without esophagitis 07/14/2017    Gastrointestinal hemorrhage    History of pulmonary embolus (PE) 07/14/2017   History of radiation therapy    Left lung- SBRT 11/13/20-11/27/20- Dr. 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   Lung cancer, lower lobe ([place]) 09/24/2020   Lung nodule, solitary 08/16/2020   Added automatically from request for surgery [id number]   Malaise and fatigue 07/14/2017   Malignant neoplasm of unspecified part of left bronchus or lung ([place]) 04/09/2021   Malignant tumor of ascending colon ([place]) 04/09/2021   Mild CAD 02/02/2019   Mild episode of recurrent major depressive disorder ([place]) 07/14/2017   Mixed hyperlipidemia 07/14/2017   Multiple lung nodules on CT 05/16/2016   Osteoporosis    Other cirrhosis of liver ([place]) 11/24/2019   Formatting of this note might be different from the original. Possible???? See CT comments 11/2019.   Other vitamin B12 deficiency anemias 04/09/2021   Paroxysmal atrial fibrillation ([place]) 04/14/2019   Formatting of this note might be different from the original. [facility].   Prediabetes 07/14/2017   Pulmonary embolism ([place]) 2017   Rheumatoid arthritis ([place]) 09/20/2010   Dr Trudie Reed in [place].   Rheumatoid arthritis(714.0)    Dr Tobie Lords follows   Schatzki's ring    Senile purpura ([place]) 01/04/2018   Skin rash 07/14/2017   Bx'd Allergic. Likely meds.   Status post thoracentesis    Symptomatic anemia 07/25/2020   Type 2 diabetes mellitus without complications ([place]) 98/92/1194   Vitamin B12 deficiency 07/14/2017   Vitamin D deficiency 04/09/2021    Past Surgical History:  Procedure Laterality Date   BIOPSY  07/27/2020   Procedure: BIOPSY;  Surgeon: Gatha Mayer, MD;  Location: [facility];  Service: Endoscopy;;   BRONCHIAL BIOPSY  09/11/2020   Procedure:  BRONCHIAL BIOPSIES;  Surgeon: Garner Nash, DO;  Location: [facility];  Service: Pulmonary;;   BRONCHIAL BRUSHINGS  09/11/2020   Procedure: BRONCHIAL BRUSHINGS;  Surgeon: Garner Nash, DO;  Location: [facility];  Service: Pulmonary;;   BRONCHIAL NEEDLE ASPIRATION BIOPSY  09/11/2020   Procedure: BRONCHIAL NEEDLE ASPIRATION BIOPSIES;  Surgeon: Garner Nash, DO;  Location: [facility];  Service: Pulmonary;;   BRONCHIAL WASHINGS  09/11/2020   Procedure: BRONCHIAL WASHINGS;  Surgeon: Garner Nash, DO;  Location: [facility];  Service: Pulmonary;;   COLONOSCOPY N/A 07/27/2020   Procedure: COLONOSCOPY;  Surgeon: Gatha Mayer, MD;  Location: [facility];  Service: Endoscopy;  Laterality: N/A;   CYSTECTOMY Right    breast   ESOPHAGOGASTRODUODENOSCOPY (EGD) WITH PROPOFOL N/A 07/27/2020   Procedure: ESOPHAGOGASTRODUODENOSCOPY (EGD) WITH PROPOFOL;  Surgeon: Gatha Mayer, MD;  Location: [place];  Service: Endoscopy;  Laterality: N/A;   FIDUCIAL MARKER PLACEMENT  09/11/2020   Procedure: FIDUCIAL MARKER PLACEMENT;  Surgeon: Garner Nash, DO;  Location: [place] [facility];  Service: Pulmonary;;   LAPAROSCOPIC RIGHT HEMI COLECTOMY Right 07/31/2020   Procedure: LAPAROSCOPIC ASSISSTED  RIGHT HEMI COLECTOMY;  Surgeon: Donnie Mesa, MD;  Location: [facility];  Service: General;  Laterality: Right;   LEFT HEART CATH AND CORONARY ANGIOGRAPHY N/A 01/05/2019   Procedure: LEFT HEART CATH AND CORONARY ANGIOGRAPHY;  Surgeon: Jettie Booze, MD;  Location: [facility];  Service: Cardiovascular;  Laterality: N/A;   SUBMUCOSAL TATTOO INJECTION  07/27/2020   Procedure: SUBMUCOSAL TATTOO INJECTION;  Surgeon: Gatha Mayer, MD;  Location: [facility];  Service: Endoscopy;;   VIDEO BRONCHOSCOPY WITH ENDOBRONCHIAL NAVIGATION Left 09/11/2020   Procedure: VIDEO BRONCHOSCOPY WITH ENDOBRONCHIAL NAVIGATION;  Surgeon: Garner Nash, DO;  Location: [facility];  Service: Pulmonary;  Laterality: Left;   ION   VIDEO BRONCHOSCOPY WITH RADIAL ENDOBRONCHIAL ULTRASOUND  09/11/2020   Procedure: RADIAL ENDOBRONCHIAL ULTRASOUND;  Surgeon: Garner Nash, DO;  Location: [facility];  Service: Pulmonary;;    Current Medications: Current Meds  Medication Sig   acetaminophen (TYLENOL) 500 MG tablet Take 500 mg by mouth every 6 (six) hours as needed for moderate pain or headache.   albuterol (VENTOLIN HFA) 108 (90 Base) MCG/ACT inhaler Inhale 2 puffs into the lungs every 4 (four) hours as needed for shortness of breath (shortness of breath (related to COPD)).   amLODipine (NORVASC) 5 MG tablet Take 1 tablet (5 mg total) by mouth daily. Patient needs appointment for further refills. 1 st attempt   aspirin EC 81 MG tablet Take 81 mg by mouth daily.   atorvastatin (LIPITOR) 20 MG tablet Take 1 tablet (20 mg total) by mouth daily. Patient needs to keep appointment for further refills. 1 st attempt   Budeson-Glycopyrrol-Formoterol (BREZTRI AEROSPHERE) 160-9-4.8 MCG/ACT AERO Inhale 2 puffs into the lungs 2 (two) times daily.   carvedilol (COREG) 3.125 MG tablet Take 1 tablet (3.125 mg total) by mouth 2 (two) times daily with a meal.   Cholecalciferol (D3-1000) 25 MCG (1000 UT) capsule Take 1,000 Units by mouth daily.   diclofenac Sodium (VOLTAREN) 1 % GEL Apply 1 application. topically as needed (pain).   Ferrous Sulfate (IRON) 325 (65 Fe) MG TABS Take 1 tablet by mouth daily.   fexofenadine (ALLEGRA) 180 MG tablet Take 180 mg by mouth daily.   gabapentin (NEURONTIN) 100 MG capsule Take 100 mg by mouth 2 (two) times daily.   hydroxychloroquine (PLAQUENIL) 200 MG tablet Take 200 mg by mouth daily.   leflunomide (ARAVA) 10 MG tablet Take 10 mg by mouth daily.   lidocaine 4 % Place 1 patch onto the skin daily.   loperamide (IMODIUM A-D) 2 MG tablet Take 2 mg by mouth as needed for diarrhea or loose stools (MAX 10 PILLS/24 HRS.).   montelukast (SINGULAIR) 10 MG tablet Take 1 tablet (10 mg total) by mouth at  bedtime.   omeprazole (PRILOSEC) 20 MG capsule Take 1 capsule (20 mg total) by mouth daily.   ondansetron (ZOFRAN-ODT) 4 MG disintegrating tablet Take 1 tablet (4 mg total) by mouth every 8 (eight) hours as needed for nausea or vomiting.   predniSONE (DELTASONE) 5 MG tablet Take 5 mg by mouth daily with breakfast.   sertraline (ZOLOFT) 100 MG tablet Take 100 mg by mouth daily.    tiZANidine (ZANAFLEX) 2 MG tablet Take 2 mg by mouth 2 (two) times daily.   traMADol (ULTRAM) 50 MG tablet Take 50 mg by mouth 3 (three) times daily as needed for moderate pain (pain).   valsartan (DIOVAN) 320 MG tablet Take 320 mg by mouth in the morning.   vitamin B-12 (CYANOCOBALAMIN)  1000 MCG tablet Take 1,000 mcg by mouth daily.      Allergies:   Other   Social History   Socioeconomic History   Marital status: Widowed    Spouse name: Not on file   Number of children: Not on file   Years of education: Not on file   Highest education level: Not on file  Occupational History   Occupation: retired  Tobacco Use   Smoking status: Former    Packs/day: 1.00    Years: 40.00    Total pack years: 40.00    Types: Cigarettes    Quit date: 02/03/2001    Years since quitting: 20.7   Smokeless tobacco: Never  Vaping Use   Vaping Use: Never used  Substance and Sexual Activity   Alcohol use: Yes    Alcohol/week: 2.0 standard drinks of alcohol    Types: 2 Standard drinks or equivalent per week    Comment: 2 drinks daily   Drug use: No   Sexual activity: Not on file  Other Topics Concern   Not on file  Social History Narrative   Tobacco use, amount per day now: NONE   Past tobacco use, amount per day: 1 PACK PER DAY   How many years did you use tobacco:TOO MANY STOPPED IN 2003   Alcohol use (drinks per week): 14   Diet: GOOD   Do you drink/eat things with caffeine: YES   Marital status: WIDOW                What year were you married? 1967   Do you live in a house, apartment, assisted living, condo,  trailer, etc.? HOUSE   Is it one or more stories? SPLIT LEVEL   How many persons live in your home? 1   Do you have pets in your home?( please list) NONE   Current or past profession: [employer]   Do you exercise?  NO                                Type and how often?   Do you have a living will? YES    Do you have a DNR form?  YES                                 If not, do you want to discuss one?   Do you have signed POA/HPOA forms?        ??              If so, please bring to you appointment      [facility] (05/16/16):   Originally from [place], [place]. 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 No pets currently. No bird, mold, or hot tub exposure.    Social Determinants of Health   Financial Resource Strain: Low Risk  (10/26/2020)   Overall Financial Resource Strain (CARDIA)    Difficulty of Paying Living Expenses: Not very hard  Food Insecurity: No Food Insecurity (10/26/2020)   Hunger Vital Sign    Worried About Running Out of Food in the Last Year: Never true    Ran Out of Food in the Last Year: Never true  Transportation Needs: Unmet Transportation Needs (10/26/2020)   PRAPARE - Hydrologist (Medical): Yes  Lack of Transportation (Non-Medical): No  Physical Activity: Not on file  Stress: Not on file  Social Connections: Socially Isolated (10/26/2020)   Social Connection and Isolation Panel [NHANES]    Frequency of Communication with Friends and Family: Twice a week    Frequency of Social Gatherings with Friends and Family: Never    Attends Religious Services: Never    Marine scientist or Organizations: No    Attends Archivist Meetings: Never    Marital Status: Widowed     Family History: The patient's family history includes Breast cancer in her maternal aunt; Clotting disorder in her mother; Emphysema in her paternal grandmother and sister; Heart disease  in her father and sister; Sleep apnea in her sister; Stroke in her mother.  ROS:   Please see the history of present illness.    All other systems reviewed and are negative.  EKGs/Labs/Other Studies Reviewed:    The following studies were reviewed today: EKG reveals sinus rhythm with nonspecific ST-T changes   Recent Labs: No results found for requested labs within last 365 days.  Recent Lipid Panel No results found for: "CHOL", "TRIG", "HDL", "CHOLHDL", "VLDL", "LDLCALC", "LDLDIRECT"  Physical Exam:    VS:  BP 136/62   Pulse 79   Ht 5' (1.524 m)   Wt 112 lb (50.8 kg)   SpO2 92%   BMI 21.87 kg/m     Wt Readings from Last 3 Encounters:  10/14/21 112 lb (50.8 kg)  04/22/21 114 lb 3.2 oz (51.8 kg)  04/10/21 116 lb (52.6 kg)     GEN: Patient is in no acute distress HEENT: Normal NECK: No JVD; No carotid bruits LYMPHATICS: No lymphadenopathy CARDIAC: Hear sounds regular, 2/6 systolic murmur at the apex. RESPIRATORY:  Clear to auscultation without rales, wheezing or rhonchi  ABDOMEN: Soft, non-tender, non-distended MUSCULOSKELETAL:  No edema; No deformity  SKIN: Warm and dry NEUROLOGIC:  Alert and oriented x 3 PSYCHIATRIC:  Normal affect   Signed, Jenean Lindau, MD  10/14/2021 12:01 PM    [facility]

## 2021-10-15 ENCOUNTER — Telehealth: Payer: Self-pay | Admitting: Oncology

## 2021-10-15 NOTE — Telephone Encounter (Signed)
Pt has been notified of upcoming scan appt date, time and instructions 

## 2021-10-21 ENCOUNTER — Encounter: Payer: Self-pay | Admitting: Internal Medicine

## 2021-10-23 ENCOUNTER — Ambulatory Visit (HOSPITAL_COMMUNITY)
Admission: RE | Admit: 2021-10-23 | Discharge: 2021-10-23 | Disposition: A | Discharge status: Home / Self Care | Payer: Medicare PPO | Source: Ambulatory Visit | Attending: Radiation Oncology | Admitting: Radiation Oncology

## 2021-10-23 DIAGNOSIS — C3432 Malignant neoplasm of lower lobe, left bronchus or lung: Secondary | ICD-10-CM | POA: Diagnosis present

## 2021-10-27 NOTE — Progress Notes (Signed)
Radiation Oncology         [phone]) [phone] ________________________________  Name: Desiree [name] MRN: [id number]  Date: 10/28/2021  DOB: 03-08-1945  Follow-Up Visit Note  CC: Raina Mina., MD  Marice Potter, MD    ICD-10-CM   1. Malignant neoplasm of lower lobe of left lung [facility])  C34.32 CT CHEST WO CONTRAST      Diagnosis: The primary encounter diagnosis was Malignant neoplasm of lower lobe of left lung ([place]). A diagnosis of Cancer of ascending colon [facility]) was also pertinent to this visit.   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 Her most recent chest CT on 10/23/21 showed a slight interval decrease in size of the posterior left lower lobe pulmonary nodule. CT also showed a increase in architectural distortion and a consolidative opacity around the LLL nodule with associated volume loss suggestive of sequelae of radiation therapy; interval development of multiple new bilateral pulmonary nodules, the most dominant of which measuring 1.4 cm in the paraspinal right lower lobe; a new 6 mm right lower lobe nodule; and multiple areas of new and progressive tree-in-bud nodularity in both lungs consistent with progression of infectious/inflammatory disease, though noted as potentially atypical in etiology.      Otherwise, no significant interval history since the patient was last seen.   She reports her breathing is stable.   She continues on 3 L of oxygen.  She denies any significant cough pain within the chest area or hemoptysis.                          Allergies:  is allergic to other.  Meds: Current Outpatient Medications  Medication Sig Dispense Refill   acetaminophen (TYLENOL) 500 MG tablet Take 500 mg by mouth every 6 (six) hours as needed for moderate pain or headache.     albuterol (VENTOLIN HFA) 108 (90 Base) MCG/ACT inhaler Inhale 2 puffs into the lungs every 4 (four) hours as needed for shortness of breath (shortness of breath (related to COPD)).     amLODipine (NORVASC) 5 MG tablet Take 1 tablet (5 mg total) by mouth daily. Patient needs appointment for further refills. 1 st attempt 30 tablet 0   aspirin EC 81 MG tablet Take 81 mg by mouth daily.     atorvastatin (LIPITOR) 20 MG tablet Take 1 tablet (20 mg total) by mouth daily. Patient needs to keep appointment for further refills. 1 st attempt 30 tablet 0   Budeson-Glycopyrrol-Formoterol (BREZTRI AEROSPHERE) 160-9-4.8 MCG/ACT AERO Inhale 2 puffs into the lungs 2 (two) times daily. 10.7 g 4   carvedilol (COREG) 3.125 MG tablet Take 1 tablet (3.125 mg total) by mouth 2 (two) times daily with a meal. 60 tablet 0   Cholecalciferol (D3-1000) 25 MCG (1000 UT) capsule Take 1,000 Units by mouth daily.     diclofenac Sodium (VOLTAREN) 1 % GEL Apply 1  application. topically as needed (pain).     Ferrous Sulfate (IRON) 325 (65 Fe) MG TABS Take 1 tablet by mouth daily.     fexofenadine (ALLEGRA) 180 MG tablet Take 180 mg by mouth daily.     gabapentin (NEURONTIN) 100 MG capsule Take 100 mg by mouth 2 (two) times daily.     hydroxychloroquine (PLAQUENIL) 200 MG tablet Take 200 mg by mouth daily.  3   leflunomide (ARAVA) 10 MG tablet Take 10 mg by mouth daily.     lidocaine 4 % Place 1 patch onto the skin daily.     loperamide (IMODIUM A-D) 2 MG tablet Take 2 mg by mouth as needed for diarrhea or loose stools (MAX 10 PILLS/24 HRS.).     montelukast (SINGULAIR)  10 MG tablet Take 1 tablet (10 mg total) by mouth at bedtime. 90 tablet 1   omeprazole (PRILOSEC) 20 MG capsule Take 1 capsule (20 mg total) by mouth daily. 90 capsule 1   ondansetron (ZOFRAN-ODT) 4 MG disintegrating tablet Take 1 tablet (4 mg total) by mouth every 8 (eight) hours as needed for nausea or vomiting. 10 tablet 0   predniSONE (DELTASONE) 5 MG tablet Take 5 mg by mouth daily with breakfast.     sertraline (ZOLOFT) 100 MG tablet Take 100 mg by mouth daily.      tiZANidine (ZANAFLEX) 2 MG tablet Take 2 mg by mouth 2 (two) times daily.     traMADol (ULTRAM) 50 MG tablet Take 50 mg by mouth 3 (three) times daily as needed for moderate pain (pain).     valsartan (DIOVAN) 320 MG tablet Take 320 mg by mouth in the morning.     vitamin B-12 (CYANOCOBALAMIN) 1000 MCG tablet Take 1,000 mcg by mouth daily.      No current facility-administered medications for this encounter.    Physical Findings: The patient is in no acute distress. Patient is alert and oriented.  3 L of oxygen by nasal cannula.  Sitting comfortably in wheelchair  weight is 114 lb 6.4 oz (51.9 kg). Her oral temperature is 97.5 F (36.4 C) (abnormal). Her blood pressure is 159/92 (abnormal) and her pulse is 105 (abnormal). Her respiration is 20 and oxygen saturation is 96%. .  No significant changes. Lungs are clear to auscultation bilaterally. Heart has regular rate and rhythm. No palpable cervical, supraclavicular, or axillary adenopathy. Abdomen soft, non-tender, normal bowel sounds.   Lab Findings: Lab Results  Component Value Date   WBC 6.5 09/24/2020   HGB 8.4 (A) 09/24/2020   HCT 26 (A) 09/24/2020   MCV 91.7 08/05/2020   PLT 263 09/24/2020    Radiographic Findings: CT CHEST WO CONTRAST  Result Date: 10/24/2021 CLINICAL DATA:  Non-small-cell lung cancer. Restaging. * Tracking Code: BO * EXAM: CT CHEST WITHOUT CONTRAST TECHNIQUE: Multidetector CT imaging of the chest was performed following the standard protocol  without IV contrast. RADIATION DOSE REDUCTION: This exam was performed according to the departmental dose-optimization program which includes automated exposure control, adjustment of the mA and/or kV according to patient size and/or use of iterative reconstruction technique. COMPARISON:  04/08/2021 FINDINGS: Cardiovascular: The heart size is normal. No substantial pericardial effusion. Coronary artery calcification is evident. Moderate atherosclerotic calcification is noted in the wall of the thoracic aorta. Mediastinum/Nodes: No mediastinal lymphadenopathy. No evidence for gross hilar lymphadenopathy although assessment is limited by the lack of intravenous contrast on the current study. The esophagus has normal imaging features. There is no axillary lymphadenopathy. Lungs/Pleura: Centrilobular  and paraseptal emphysema evident. New clustered nodularity is seen in the lung apices bilaterally measuring up to 10 mm on the right (29/5). Tree-in-bud nodularity posterior right upper lobe on 39/5 is progressive in the interval. 6 mm left upper lobe pulmonary nodule identified previously is 6 mm again today on 45/5 Interval progression of tree-in-bud nodularity in the posterior right upper lobe and superior segment of the right lower lobe. Tree-in-bud nodularity seen previously in the inferior right lower lobe is again noted. 6 mm right lower lobe nodule on 73/5 is new in the interval. 1.4 cm paraspinal right lower lobe nodule on 84/5 is new in the interval. There is new architectural distortion and scarring in the left lower lobe with associated volume loss. The bilobed pulmonary nodule seen posterior to the fiducial marker on the previous study has decreased in size in the interval measuring 1.3 x 0.8 cm today (91/5) compared to 1.6 x 1.0 cm previously. Interval improvement in tree-in-bud nodularity seen previously in the lingula with new peripheral tree-in-bud disease in the left lower lobe on image 75/5. No pleural  effusion. Upper Abdomen: Unremarkable. Musculoskeletal: No worrisome lytic or sclerotic osseous abnormality. IMPRESSION: 1. Posterior left lower lobe pulmonary nodule has decreased slightly in size in the interval. Increasing architectural distortion and consolidative opacity in the left around this nodule is associated with volume loss, features suggesting sequelae of radiation therapy. 2. Interval development of multiple new bilateral pulmonary nodules. The most dominant new nodule measures 1.4 cm in the paraspinal right lower lobe. Given findings described below, this may be infectious/inflammatory, but metastatic disease or metachronous primary not excluded. 3. New 6 mm right lower lobe nodule. Close attention on follow-up recommended. 4. Multiple areas of new and progressive tree-in-bud nodularity in both lungs consistent with progression of infectious/inflammatory disease, potentially atypical etiology. Metastatic disease is considered less likely but not excluded. 5.  Emphysema (ICD10-J43.9) and Aortic Atherosclerosis (ICD10-170.0) Electronically Signed   By: Misty Stanley M.D.   On: 10/24/2021 10:31    Impression: The primary encounter diagnosis was Malignant neoplasm of lower lobe of left lung ([place]). A diagnosis of Cancer of ascending colon [facility]) was also pertinent to this visit.   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  As above infectious/inflammatory disease within both lungs.  I discussed this issue with the patient.  We discussed either proceeding with a PET scan at this time or short-term follow-up chest CT scan.  She agrees with short interval chest CT scan.  Plan: CT scan of the chest in 3 months.  In addition the patient will follow-up with Dr. Lavera Guise in [place] later this  week.     23 minutes of total time was spent for this patient encounter, including preparation, face-to-face counseling with the patient and coordination of care, physical exam, and documentation of the encounter. ____________________________________  Blair Promise, PhD, MD  This document serves as a record of services personally performed by Gery Pray, MD. It was created on his behalf by Roney Mans, a trained medical scribe. The creation of this record is based on the scribe's personal observations and the provider's statements to them. This document has been checked and approved by the attending provider.

## 2021-10-28 ENCOUNTER — Other Ambulatory Visit: Payer: Self-pay

## 2021-10-28 ENCOUNTER — Ambulatory Visit
Admission: RE | Admit: 2021-10-28 | Discharge: 2021-10-28 | Disposition: A | Discharge status: Home / Self Care | Payer: Medicare PPO | Source: Ambulatory Visit | Attending: Radiation Oncology | Admitting: Radiation Oncology

## 2021-10-28 ENCOUNTER — Encounter: Payer: Self-pay | Admitting: Radiation Oncology

## 2021-10-28 DIAGNOSIS — Z85048 Personal history of other malignant neoplasm of rectum, rectosigmoid junction, and anus: Secondary | ICD-10-CM | POA: Insufficient documentation

## 2021-10-28 DIAGNOSIS — J432 Centrilobular emphysema: Secondary | ICD-10-CM | POA: Insufficient documentation

## 2021-10-28 DIAGNOSIS — Z9981 Dependence on supplemental oxygen: Secondary | ICD-10-CM | POA: Insufficient documentation

## 2021-10-28 DIAGNOSIS — Z923 Personal history of irradiation: Secondary | ICD-10-CM | POA: Insufficient documentation

## 2021-10-28 DIAGNOSIS — Z7969 Long term (current) use of other immunomodulators and immunosuppressants: Secondary | ICD-10-CM | POA: Diagnosis not present

## 2021-10-28 DIAGNOSIS — I700 Atherosclerosis of aorta: Secondary | ICD-10-CM | POA: Insufficient documentation

## 2021-10-28 DIAGNOSIS — C3432 Malignant neoplasm of lower lobe, left bronchus or lung: Secondary | ICD-10-CM | POA: Insufficient documentation

## 2021-10-28 DIAGNOSIS — Z79899 Other long term (current) drug therapy: Secondary | ICD-10-CM | POA: Insufficient documentation

## 2021-10-28 DIAGNOSIS — Z7982 Long term (current) use of aspirin: Secondary | ICD-10-CM | POA: Diagnosis not present

## 2021-10-28 NOTE — Progress Notes (Signed)
Desiree [name] is here today for follow up post radiation to the lung.  Lung Side: Left, patient completed treatment on 11/27/20.  Does the patient complain of any of the following: Pain:No Shortness of breath w/wo exertion: Yes on exertion. Patient wears oxygen at 3 L via [place]. Cough: Yes Hemoptysis: No Pain with swallowing: No Swallowing/choking concerns: No Appetite: Good Energy Level: Continues to have fatigue.  Post radiation skin Changes: No    Additional comments if applicable:  BP (!) 016/42 (Patient Position: Sitting) Comment: nurse notified  Pulse (!) 105 Comment: nurse notified  Temp (!) 97.5 F (36.4 C) (Oral)   Resp 20   Wt 114 lb 6.4 oz (51.9 kg)   SpO2 96%   BMI 22.34 kg/m

## 2021-10-28 NOTE — Progress Notes (Deleted)
[facility]  [address] [place],  [place]  [address] [phone]  Clinic Day:  06/26/2021  Referring physician: Raina Mina., MD  HISTORY OF PRESENT ILLNESS:  The patient is a 76 y.o. 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  Due to her poor baseline health, adjuvant chemotherapy was not entertained.  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  The patient completed stereotactic radiation for her lung cancer in October 2022.  The patient comes in today to review her clinical picture.  In the past, a PET scan done in January 2023 showed 2 small bilateral lung nodules that were mildly hypermetabolic.  However, based upon their small size, they could not be biopsied.  She comes in today to reassess her CEA level, which is being followed each visit.  Since her last visit, the patient has been doing okay.  She has chronic shortness of breath for which she uses 3 L of oxygen.  However, she denies having any new respiratory symptoms which concern her for recurrent lung cancer.  She also denies having any new GI symptoms that concern her for recurrent colon cancer.  PHYSICAL EXAM:  There were no vitals taken for this visit. Wt Readings from Last 3 Encounters:  10/28/21 114 lb 6.4 oz (51.9 kg)  10/14/21 112 lb (50.8 kg)  04/22/21 114 lb 3.2 oz (51.8 kg)   There is no height or weight on file to calculate BMI. Performance status (ECOG): 3 - Symptomatic, >50% confined to bed Physical Exam Constitutional:      Appearance: Normal appearance. She is ill-appearing.     Comments: Chronically-ill appearing woman ambulating with a walker, wearing oxygen per nasal canula  HENT:     Mouth/Throat:     Mouth: Mucous membranes are moist.     Pharynx: Oropharynx is clear. No oropharyngeal exudate or posterior oropharyngeal erythema.  Cardiovascular:      Rate and Rhythm: Normal rate and regular rhythm.     Heart sounds: No murmur heard.    No friction rub. No gallop.  Pulmonary:     Effort: Pulmonary effort is normal. No respiratory distress.     Breath sounds: Normal breath sounds. No wheezing, rhonchi or rales.     Comments: Diffusely decreased breath sounds bilaterally Abdominal:     General: Bowel sounds are normal. There is no distension.     Palpations: Abdomen is soft. There is no mass.     Tenderness: There is no abdominal tenderness.  Musculoskeletal:        General: No swelling.     Right lower leg: No edema.     Left lower leg: No edema.  Lymphadenopathy:     Cervical: No cervical adenopathy.     Upper Body:     Right upper body: No supraclavicular or axillary adenopathy.     Left upper body: No supraclavicular or axillary adenopathy.     Lower Body: No right inguinal adenopathy. No left inguinal adenopathy.  Skin:    General: Skin is warm.     Coloration: Skin is not jaundiced.     Findings: No lesion or rash.  Neurological:     General: No focal deficit present.     Mental Status: She is alert and oriented to person, place, and time. Mental status is at baseline.  Psychiatric:  Mood and Affect: Mood normal.        Behavior: Behavior normal.        Thought Content: Thought content normal.    LABS:    Latest Reference Range & Units Most Recent 02/06/21 14:14  CEA 0.0 - 4.7 ng/mL 9.4 (H) 06/24/21 10:56 9.2 (H)  (H): Data is abnormally high   ASSESSMENT & PLAN:  A 76 y.o. 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  She also has a history of stage IIIA (T2 N1 M0) colon cancer, status post a right hemicolectomy in June 2022.  When evaluating her most recent CEA level, it is not much different than what it has been previously.  Clinically, the patient appears to have not had any decline in her health.  I will see her back in 4 months for  repeat clinical assessment.  At that time, repeat CT scans of her chest/abdomen/pelvis will be done to ensure that her pulmonary nodules have not progressed in size, as well as to ensure there is no signs of disease recurrence in her abdomen and pelvis.  The patient understands all the plans discussed today and is in agreement with them.  Azzan Butler Macarthur Critchley, MD

## 2021-10-29 ENCOUNTER — Ambulatory Visit: Payer: Medicare PPO | Admitting: Oncology

## 2021-11-06 LAB — BASIC METABOLIC PANEL
BUN: 16 (ref 4–21)
CO2: 27 — AB (ref 13–22)
Chloride: 99 (ref 99–108)
Creatinine: 0.8 (ref <=1.1)
Glucose: 86
Potassium: 3.8 mEq/L (ref 3.5–5.1)
Sodium: 132 — AB (ref 137–147)

## 2021-11-06 LAB — HEPATIC FUNCTION PANEL
ALT: 17 U/L (ref 7–35)
AST: 48 — AB (ref 13–35)
Alkaline Phosphatase: 101 (ref 25–125)
Bilirubin, Total: 0.5

## 2021-11-06 LAB — COMPREHENSIVE METABOLIC PANEL
Albumin: 4 (ref 3.5–5.0)
Calcium: 9.7 (ref 8.7–10.7)
eGFR: >60

## 2021-11-06 LAB — CBC AND DIFFERENTIAL
HCT: 35 — AB (ref 36–46)
Hemoglobin: 11.5 — AB (ref 12.0–16.0)
Neutrophils Absolute: 6.88
Platelets: 339 10*3/uL (ref 150–400)
WBC: 9.3

## 2021-11-06 LAB — CBC: RBC: 3.56 — AB (ref 3.87–5.11)

## 2021-11-11 ENCOUNTER — Other Ambulatory Visit: Payer: Self-pay

## 2021-11-11 ENCOUNTER — Encounter: Payer: Self-pay | Admitting: Oncology

## 2021-11-12 LAB — CEA: CEA: 7.4

## 2021-11-18 ENCOUNTER — Ambulatory Visit: Payer: Medicare PPO | Admitting: Oncology

## 2021-11-26 NOTE — Progress Notes (Signed)
[facility]  [address] [place],  [place]  [address] [phone]  Clinic Day:  11/27/2021  Referring physician: Raina Mina., MD  HISTORY OF PRESENT ILLNESS:  The patient is a 76 y.o. 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  Due to her poor baseline health, adjuvant chemotherapy was not entertained.  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  The patient completed stereotactic radiation for her lung cancer in October 2022.  The patient comes in today to go over her most recent scans to evaluate her new disease baseline.  Since her last visit, the patient has been doing okay.  She has chronic shortness of breath for which she uses 3 L of oxygen.  However, she denies having any new respiratory symptoms which concern her for recurrent lung cancer.  She also denies having any new GI symptoms that concern her for recurrent colon cancer.    PHYSICAL EXAM:  Blood pressure (!) 143/81, pulse (!) 112, temperature 98.8 F (37.1 C), resp. rate (!) 22, height 5' (1.524 m), weight 112 lb 3.2 oz (50.9 kg), SpO2 (!) 87 %. Wt Readings from Last 3 Encounters:  11/27/21 112 lb 3.2 oz (50.9 kg)  10/28/21 114 lb 6.4 oz (51.9 kg)  10/14/21 112 lb (50.8 kg)   Body mass index is 21.91 kg/m. Performance status (ECOG): 3 - Symptomatic, >50% confined to bed Physical Exam Constitutional:      Appearance: Normal appearance. She is ill-appearing.     Comments: Chronically-ill appearing woman who is in a wheelchair, wearing oxygen per nasal canula.  She looks weaker versus previous visits  HENT:     Mouth/Throat:     Mouth: Mucous membranes are moist.     Pharynx: Oropharynx is clear. No oropharyngeal exudate or posterior oropharyngeal erythema.  Cardiovascular:     Rate and Rhythm: Normal rate and regular rhythm.     Heart sounds: No murmur heard.     No friction rub. No gallop.  Pulmonary:     Effort: Pulmonary effort is normal. No respiratory distress.     Breath sounds: Normal breath sounds. No wheezing, rhonchi or rales.     Comments: Diffusely decreased breath sounds bilaterally Abdominal:     General: Bowel sounds are normal. There is no distension.     Palpations: Abdomen is soft. There is no mass.     Tenderness: There is no abdominal tenderness.  Musculoskeletal:        General: No swelling.     Right lower leg: No edema.     Left lower leg: No edema.  Lymphadenopathy:     Cervical: No cervical adenopathy.     Upper Body:     Right upper body: No supraclavicular or axillary adenopathy.     Left upper body: No supraclavicular or axillary adenopathy.     Lower Body: No right inguinal adenopathy. No left inguinal adenopathy.  Skin:    General: Skin is warm.     Coloration: Skin is not jaundiced.     Findings: No lesion or rash.  Neurological:     General: No focal deficit present.     Mental Status: She is alert and oriented to person, place, and time. Mental status is at baseline.  Psychiatric:        Mood and Affect: Mood normal.  Behavior: Behavior normal.        Thought Content: Thought content normal.   SCANS:  Recent CT scans of her chest/abdomen/pelvis revealed the following: FINDINGS: CT CHEST FINDINGS  Cardiovascular: Aortic atherosclerosis. Mild cardiomegaly, without pericardial effusion. Multivessel coronary artery atherosclerosis. No central pulmonary embolism, on this non-dedicated study.  Mediastinum/Nodes: No mediastinal or hilar adenopathy.  Lungs/Pleura: Trace left pleural fluid, similar to on the recent chest CT. Moderate centrilobular emphysema.  Innumerable clustered nodules throughout both lungs, greatest in the right upper and superior segment right lower lobes. This makes evaluation for superimposed neoplasm challenging.  The right lower lobe pleural-based nodule of interest  measures 1.3 cm on 65/301 and is similar to 1.3 cm on 10/23/2021 (when remeasured).  More anteriorly in the right lower lobe is a 6 mm nodule on 58/301 which is also similar to that exam.  Index posterior left upper lobe 5 mm nodule on 31/3 101 is unchanged.  Presumed treated left lower lobe lung primary, with consolidation, radiation or biopsy clips and architectural distortion with volume loss. Appearance is similar.  Musculoskeletal: Osteopenia. Right and probable left-sided remote anterior rib fractures.  CT ABDOMEN PELVIS FINDINGS  Hepatobiliary: Too small to characterize liver lesions again identified, without dominant suspicious mass. Normal gallbladder, without biliary ductal dilatation.  Pancreas: Normal, without mass or ductal dilatation.  Spleen: Normal in size, without focal abnormality.  Adrenals/Urinary Tract: Normal adrenal glands. Normal kidneys for age, with mild renal cortical thinning but no hydronephrosis. Normal urinary bladder.  Stomach/Bowel: Normal stomach, without wall thickening. Extensive colonic diverticulosis. Normal small bowel.  Vascular/Lymphatic: Advanced aortic and branch vessel atherosclerosis. No abdominopelvic adenopathy.  Reproductive: Normal uterus. Low-density bilateral adnexal lesions of maximally 2.0 cm are likely residual follicles or cysts.  Other: No significant free fluid. Moderate pelvic floor laxity. No free intraperitoneal air. No evidence of omental or peritoneal disease.  Musculoskeletal: Osteopenia.  IMPRESSION: 1. Relatively similar appearance of the chest back to the CT from [place] of approximately 2 weeks ago. Presumed treatment related consolidation and architectural distortion in the left lower lobe. 2. Diffuse pulmonary nodularity, likely due to chronic atypical infection. This makes evaluation for concurrent neoplasm challenging. 3. Larger pulmonary nodules, including a stable pleural-based  right lower lobe pulmonary nodule which could represent metastasis or metachronous primary. 4. No acute process or evidence of metastatic disease in the abdomen or pelvis. 5. Aortic atherosclerosis (ICD10-I70.0), coronary artery atherosclerosis and emphysema (ICD10-J43.9).  LABS:    Latest Reference Range & Units 11/06/21 00:00  WBC  9.3 (E)  RBC 3.87 - 5.11  3.56 ! (E)  Hemoglobin 12.0 - 16.0  11.5 ! (E)  HCT 36 - 46  35 ! (E)  Platelets 150 - 400 K/uL 339 (E)  !: Data is abnormal (E): External lab result  Latest Reference Range & Units 11/06/21 00:00  Sodium 137 - 147  132 ! (E)  Potassium 3.5 - 5.1 mEq/L 3.8 (E)  Chloride 99 - 108  99 (E)  CO2 13 - 22  27 ! (E)  Glucose  86 (E)  BUN 4 - 21  16 (E)  Creatinine 0.5 - 1.1  0.8 (E)  Calcium 8.7 - 10.7  9.7 (E)  eGFR  >60 (E)  Alkaline Phosphatase 25 - 125  101 (E)  Albumin 3.5 - 5.0  4.0 (E)  AST 13 - 35  48 ! (E)  ALT 7 - 35 U/L 17 (E)  Bilirubin, Total  0.5 (E)  !: Data is  abnormal (E): External lab result  11/06/21 00:00  CEA 7.4 (E)  (E): External lab result  ASSESSMENT & PLAN:  A 76 y.o. 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  She also has a history of stage IIIA (T2 N1 M0) colon cancer, status post a right hemicolectomy in June 2022.  In clinic today, I went over all of her CT scan images with her, for which there was no obvious evidence of recurrence of either of her 2 previous cancers.  Furthermore, her CEA level of 7.4 is better than the 9.4 it was 5 months ago.  Overall, there is nothing to suggest there is disease recurrence of either her colon or lung cancer.  Moving forward, her CEA level will continue to be followed with each successive visit.  Repeat CT scans will be done before her next visit to ensure there remains no evidence of disease recurrence.  The patient understands all the plans discussed today and is in agreement with  them.  Delton Stelle Macarthur Critchley, MD

## 2021-11-27 ENCOUNTER — Other Ambulatory Visit: Payer: Self-pay | Admitting: Oncology

## 2021-11-27 ENCOUNTER — Inpatient Hospital Stay: Payer: Medicare PPO | Attending: Oncology | Admitting: Oncology

## 2021-11-27 VITALS — BP 143/81 | HR 112 | Temp 98.8°F | Resp 22 | Ht 60.0 in | Wt 112.2 lb

## 2021-11-27 DIAGNOSIS — C182 Malignant neoplasm of ascending colon: Secondary | ICD-10-CM

## 2021-12-31 DIAGNOSIS — I4891 Unspecified atrial fibrillation: Secondary | ICD-10-CM | POA: Diagnosis not present

## 2022-01-07 ENCOUNTER — Telehealth: Payer: Self-pay | Admitting: *Deleted

## 2022-01-07 NOTE — Telephone Encounter (Signed)
Called patient to inform of Ct for 01-23-22, spoke with patient's daughter-Christy and she stated that her mom broke her hip in multiple places and doesn't feel that her mom will be able to do this scan and fu, notified Dr. Sondra Come and his nurse

## 2022-01-23 ENCOUNTER — Ambulatory Visit (HOSPITAL_COMMUNITY): Payer: Medicare PPO

## 2022-01-30 ENCOUNTER — Ambulatory Visit: Payer: Self-pay | Admitting: Radiation Oncology

## 2022-02-28 NOTE — Progress Notes (Incomplete)
Desiree [name] presents to clinic today for follow up for radiation therapy for lung cancer. She completed radiation treatment on 11-27-20. He will receive CT results on this visit.   PAIN: {Pain rating:20411} {PAIN DESCRIPTION:21022940} over {Location on body:14304}.  RESPIRATORY: {CHL RAD ONC LUNG [id number]. Pt is on {Exam; oxygen delivery:30093}. Noted {Exam; skin abnormals:103} {CHL RAD ONC SKIN ROS:11522895}.  SWALLOWING/DIET: Pt {gerd dysphagia:12484} {Desc; oto dysphagia:17829}. {Daily diet habits:20576}.  OTHER: Pt complains of {Blank multiple:19196::"fatigue","weakness","loss of sleep","poor appetite"}.  There were no vitals taken for this visit.   Wt Readings from Last 3 Encounters:  11/27/21 112 lb 3.2 oz (50.9 kg)  10/28/21 114 lb 6.4 oz (51.9 kg)  10/14/21 112 lb (50.8 kg)

## 2022-03-04 ENCOUNTER — Telehealth: Payer: Self-pay | Admitting: *Deleted

## 2022-03-04 ENCOUNTER — Ambulatory Visit (HOSPITAL_COMMUNITY): Payer: Medicare PPO

## 2022-03-04 NOTE — Telephone Encounter (Signed)
CALLED PATIENT'S SON- ERIC TO ASK ABOUT RESCHEDULING CT AND FU FOR HIS MOM, DUE TO THE FACT THAT SHE FELL AND BROKE HER HIP, SCAN HAS BEEN RESCHEDULED FOR 04-07-22 AND FU APPT. WITH DR. KINARD HAS BEEN RESCHEDULED FOR 04-14-22 @ 11:30 AM FOR RESULTS, PATIENT'S SON ERIC IS IN AGREEMENT  WITH THESE APPT. DAYS AND TIMES

## 2022-03-06 ENCOUNTER — Ambulatory Visit
Admission: RE | Admit: 2022-03-06 | Discharge: 2022-03-06 | Disposition: A | Discharge status: Home / Self Care | Payer: Medicare PPO | Source: Ambulatory Visit | Attending: Radiation Oncology | Admitting: Radiation Oncology

## 2022-03-06 DIAGNOSIS — C3432 Malignant neoplasm of lower lobe, left bronchus or lung: Secondary | ICD-10-CM

## 2022-03-10 ENCOUNTER — Telehealth: Payer: Self-pay | Admitting: Oncology

## 2022-03-10 NOTE — Telephone Encounter (Signed)
CT Chest has been scheduled.  Notified pt of date,time and instructions.

## 2022-03-30 NOTE — Progress Notes (Deleted)
[facility]  [address] [place],  [place]  [address] [phone]  Clinic Day:  11/27/2021  Referring physician: Raina Mina., MD  HISTORY OF PRESENT ILLNESS:  The patient is a 77 y.o. 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  Due to her poor baseline health, adjuvant chemotherapy was not entertained.  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  The patient completed stereotactic radiation for her lung cancer in October 2022.  The patient comes in today to go over her most recent scans to evaluate her new disease baseline.  Since her last visit, the patient has been doing okay.  She has chronic shortness of breath for which she uses 3 L of oxygen.  However, she denies having any new respiratory symptoms which concern her for recurrent lung cancer.  She also denies having any new GI symptoms that concern her for recurrent colon cancer.    PHYSICAL EXAM:  There were no vitals taken for this visit. Wt Readings from Last 3 Encounters:  11/27/21 112 lb 3.2 oz (50.9 kg)  10/28/21 114 lb 6.4 oz (51.9 kg)  10/14/21 112 lb (50.8 kg)   There is no height or weight on file to calculate BMI. Performance status (ECOG): 3 - Symptomatic, >50% confined to bed Physical Exam Constitutional:      Appearance: Normal appearance. She is ill-appearing.     Comments: Chronically-ill appearing woman who is in a wheelchair, wearing oxygen per nasal canula.  She looks weaker versus previous visits  HENT:     Mouth/Throat:     Mouth: Mucous membranes are moist.     Pharynx: Oropharynx is clear. No oropharyngeal exudate or posterior oropharyngeal erythema.  Cardiovascular:     Rate and Rhythm: Normal rate and regular rhythm.     Heart sounds: No murmur heard.    No friction rub. No gallop.  Pulmonary:     Effort: Pulmonary effort is normal. No  respiratory distress.     Breath sounds: Normal breath sounds. No wheezing, rhonchi or rales.     Comments: Diffusely decreased breath sounds bilaterally Abdominal:     General: Bowel sounds are normal. There is no distension.     Palpations: Abdomen is soft. There is no mass.     Tenderness: There is no abdominal tenderness.  Musculoskeletal:        General: No swelling.     Right lower leg: No edema.     Left lower leg: No edema.  Lymphadenopathy:     Cervical: No cervical adenopathy.     Upper Body:     Right upper body: No supraclavicular or axillary adenopathy.     Left upper body: No supraclavicular or axillary adenopathy.     Lower Body: No right inguinal adenopathy. No left inguinal adenopathy.  Skin:    General: Skin is warm.     Coloration: Skin is not jaundiced.     Findings: No lesion or rash.  Neurological:     General: No focal deficit present.     Mental Status: She is alert and oriented to person, place, and time. Mental status is at baseline.  Psychiatric:        Mood and Affect: Mood normal.        Behavior: Behavior normal.        Thought Content: Thought  content normal.   SCANS:  Recent CT scans of her chest/abdomen/pelvis revealed the following: FINDINGS: CT CHEST FINDINGS  Cardiovascular: Aortic atherosclerosis. Mild cardiomegaly, without pericardial effusion. Multivessel coronary artery atherosclerosis. No central pulmonary embolism, on this non-dedicated study.  Mediastinum/Nodes: No mediastinal or hilar adenopathy.  Lungs/Pleura: Trace left pleural fluid, similar to on the recent chest CT. Moderate centrilobular emphysema.  Innumerable clustered nodules throughout both lungs, greatest in the right upper and superior segment right lower lobes. This makes evaluation for superimposed neoplasm challenging.  The right lower lobe pleural-based nodule of interest measures 1.3 cm on 65/301 and is similar to 1.3 cm on 10/23/2021  (when remeasured).  More anteriorly in the right lower lobe is a 6 mm nodule on 58/301 which is also similar to that exam.  Index posterior left upper lobe 5 mm nodule on 31/3 101 is unchanged.  Presumed treated left lower lobe lung primary, with consolidation, radiation or biopsy clips and architectural distortion with volume loss. Appearance is similar.  Musculoskeletal: Osteopenia. Right and probable left-sided remote anterior rib fractures.  CT ABDOMEN PELVIS FINDINGS  Hepatobiliary: Too small to characterize liver lesions again identified, without dominant suspicious mass. Normal gallbladder, without biliary ductal dilatation.  Pancreas: Normal, without mass or ductal dilatation.  Spleen: Normal in size, without focal abnormality.  Adrenals/Urinary Tract: Normal adrenal glands. Normal kidneys for age, with mild renal cortical thinning but no hydronephrosis. Normal urinary bladder.  Stomach/Bowel: Normal stomach, without wall thickening. Extensive colonic diverticulosis. Normal small bowel.  Vascular/Lymphatic: Advanced aortic and branch vessel atherosclerosis. No abdominopelvic adenopathy.  Reproductive: Normal uterus. Low-density bilateral adnexal lesions of maximally 2.0 cm are likely residual follicles or cysts.  Other: No significant free fluid. Moderate pelvic floor laxity. No free intraperitoneal air. No evidence of omental or peritoneal disease.  Musculoskeletal: Osteopenia.  IMPRESSION: 1. Relatively similar appearance of the chest back to the CT from [place] of approximately 2 weeks ago. Presumed treatment related consolidation and architectural distortion in the left lower lobe. 2. Diffuse pulmonary nodularity, likely due to chronic atypical infection. This makes evaluation for concurrent neoplasm challenging. 3. Larger pulmonary nodules, including a stable pleural-based right lower lobe pulmonary nodule which could represent metastasis  or metachronous primary. 4. No acute process or evidence of metastatic disease in the abdomen or pelvis. 5. Aortic atherosclerosis (ICD10-I70.0), coronary artery atherosclerosis and emphysema (ICD10-J43.9).  LABS:    Latest Reference Range & Units 11/06/21 00:00  WBC  9.3 (E)  RBC 3.87 - 5.11  3.56 ! (E)  Hemoglobin 12.0 - 16.0  11.5 ! (E)  HCT 36 - 46  35 ! (E)  Platelets 150 - 400 K/uL 339 (E)  !: Data is abnormal (E): External lab result  Latest Reference Range & Units 11/06/21 00:00  Sodium 137 - 147  132 ! (E)  Potassium 3.5 - 5.1 mEq/L 3.8 (E)  Chloride 99 - 108  99 (E)  CO2 13 - 22  27 ! (E)  Glucose  86 (E)  BUN 4 - 21  16 (E)  Creatinine 0.5 - 1.1  0.8 (E)  Calcium 8.7 - 10.7  9.7 (E)  eGFR  >60 (E)  Alkaline Phosphatase 25 - 125  101 (E)  Albumin 3.5 - 5.0  4.0 (E)  AST 13 - 35  48 ! (E)  ALT 7 - 35 U/L 17 (E)  Bilirubin, Total  0.5 (E)  !: Data is abnormal (E): External lab result  11/06/21 00:00  CEA 7.4 (E)  (  E): External lab result  ASSESSMENT & PLAN:  A 77 y.o. 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  She also has a history of stage IIIA (T2 N1 M0) colon cancer, status post a right hemicolectomy in June 2022.  In clinic today, I went over all of her CT scan images with her, for which there was no obvious evidence of recurrence of either of her 2 previous cancers.  Furthermore, her CEA level of 7.4 is better than the 9.4 it was 5 months ago.  Overall, there is nothing to suggest there is disease recurrence of either her colon or lung cancer.  Moving forward, her CEA level will continue to be followed with each successive visit.  Repeat CT scans will be done before her next visit to ensure there remains no evidence of disease recurrence.  The patient understands all the plans discussed today and is in agreement with them.  Jeannemarie Sawaya Macarthur Critchley, MD

## 2022-03-31 ENCOUNTER — Inpatient Hospital Stay: Payer: Medicare PPO | Admitting: Oncology

## 2022-04-07 ENCOUNTER — Ambulatory Visit (HOSPITAL_COMMUNITY): Payer: Medicare PPO

## 2022-04-08 NOTE — Progress Notes (Deleted)
[facility]  [address] [place],    [address] [phone]  Clinic Day:  11/27/2021  Referring physician: Raina Mina., MD  HISTORY OF PRESENT ILLNESS:  The patient is a 77 y.o. 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  Due to her poor baseline health, adjuvant chemotherapy was not entertained.  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  The patient completed stereotactic radiation for her lung cancer in October 2022.  The patient comes in today to go over her most recent scans to evaluate her new disease baseline.  Since her last visit, the patient has been doing okay.  She has chronic shortness of breath for which she uses 3 L of oxygen.  However, she denies having any new respiratory symptoms which concern her for recurrent lung cancer.  She also denies having any new GI symptoms that concern her for recurrent colon cancer.    PHYSICAL EXAM:  There were no vitals taken for this visit. Wt Readings from Last 3 Encounters:  11/27/21 112 lb 3.2 oz (50.9 kg)  10/28/21 114 lb 6.4 oz (51.9 kg)  10/14/21 112 lb (50.8 kg)   There is no height or weight on file to calculate BMI. Performance status (ECOG): 3 - Symptomatic, >50% confined to bed Physical Exam Constitutional:      Appearance: Normal appearance. She is ill-appearing.     Comments: Chronically-ill appearing woman who is in a wheelchair, wearing oxygen per nasal canula.  She looks weaker versus previous visits  HENT:     Mouth/Throat:     Mouth: Mucous membranes are moist.     Pharynx: Oropharynx is clear. No oropharyngeal exudate or posterior oropharyngeal erythema.  Cardiovascular:     Rate and Rhythm: Normal rate and regular rhythm.     Heart sounds: No murmur heard.    No friction rub. No gallop.  Pulmonary:     Effort: Pulmonary effort is normal. No  respiratory distress.     Breath sounds: Normal breath sounds. No wheezing, rhonchi or rales.     Comments: Diffusely decreased breath sounds bilaterally Abdominal:     General: Bowel sounds are normal. There is no distension.     Palpations: Abdomen is soft. There is no mass.     Tenderness: There is no abdominal tenderness.  Musculoskeletal:        General: No swelling.     Right lower leg: No edema.     Left lower leg: No edema.  Lymphadenopathy:     Cervical: No cervical adenopathy.     Upper Body:     Right upper body: No supraclavicular or axillary adenopathy.     Left upper body: No supraclavicular or axillary adenopathy.     Lower Body: No right inguinal adenopathy. No left inguinal adenopathy.  Skin:    General: Skin is warm.     Coloration: Skin is not jaundiced.     Findings: No lesion or rash.  Neurological:     General: No focal deficit present.     Mental Status: She is alert and oriented to person, place, and time. Mental status is at baseline.  Psychiatric:        Mood and Affect: Mood normal.        Behavior: Behavior normal.        Thought Content: Thought  content normal.   SCANS:  Recent CT scans of her chest/abdomen/pelvis revealed the following: FINDINGS: CT CHEST FINDINGS  Cardiovascular: Aortic atherosclerosis. Mild cardiomegaly, without pericardial effusion. Multivessel coronary artery atherosclerosis. No central pulmonary embolism, on this non-dedicated study.  Mediastinum/Nodes: No mediastinal or hilar adenopathy.  Lungs/Pleura: Trace left pleural fluid, similar to on the recent chest CT. Moderate centrilobular emphysema.  Innumerable clustered nodules throughout both lungs, greatest in the right upper and superior segment right lower lobes. This makes evaluation for superimposed neoplasm challenging.  The right lower lobe pleural-based nodule of interest measures 1.3 cm on 65/301 and is similar to 1.3 cm on 10/23/2021  (when remeasured).  More anteriorly in the right lower lobe is a 6 mm nodule on 58/301 which is also similar to that exam.  Index posterior left upper lobe 5 mm nodule on 31/3 101 is unchanged.  Presumed treated left lower lobe lung primary, with consolidation, radiation or biopsy clips and architectural distortion with volume loss. Appearance is similar.  Musculoskeletal: Osteopenia. Right and probable left-sided remote anterior rib fractures.  CT ABDOMEN PELVIS FINDINGS  Hepatobiliary: Too small to characterize liver lesions again identified, without dominant suspicious mass. Normal gallbladder, without biliary ductal dilatation.  Pancreas: Normal, without mass or ductal dilatation.  Spleen: Normal in size, without focal abnormality.  Adrenals/Urinary Tract: Normal adrenal glands. Normal kidneys for age, with mild renal cortical thinning but no hydronephrosis. Normal urinary bladder.  Stomach/Bowel: Normal stomach, without wall thickening. Extensive colonic diverticulosis. Normal small bowel.  Vascular/Lymphatic: Advanced aortic and branch vessel atherosclerosis. No abdominopelvic adenopathy.  Reproductive: Normal uterus. Low-density bilateral adnexal lesions of maximally 2.0 cm are likely residual follicles or cysts.  Other: No significant free fluid. Moderate pelvic floor laxity. No free intraperitoneal air. No evidence of omental or peritoneal disease.  Musculoskeletal: Osteopenia.  IMPRESSION: 1. Relatively similar appearance of the chest back to the CT from [place] of approximately 2 weeks ago. Presumed treatment related consolidation and architectural distortion in the left lower lobe. 2. Diffuse pulmonary nodularity, likely due to chronic atypical infection. This makes evaluation for concurrent neoplasm challenging. 3. Larger pulmonary nodules, including a stable pleural-based right lower lobe pulmonary nodule which could represent metastasis  or metachronous primary. 4. No acute process or evidence of metastatic disease in the abdomen or pelvis. 5. Aortic atherosclerosis (ICD10-I70.0), coronary artery atherosclerosis and emphysema (ICD10-J43.9).  LABS:    Latest Reference Range & Units 11/06/21 00:00  WBC  9.3 (E)  RBC 3.87 - 5.11  3.56 ! (E)  Hemoglobin 12.0 - 16.0  11.5 ! (E)  HCT 36 - 46  35 ! (E)  Platelets 150 - 400 K/uL 339 (E)  !: Data is abnormal (E): External lab result  Latest Reference Range & Units 11/06/21 00:00  Sodium 137 - 147  132 ! (E)  Potassium 3.5 - 5.1 mEq/L 3.8 (E)  Chloride 99 - 108  99 (E)  CO2 13 - 22  27 ! (E)  Glucose  86 (E)  BUN 4 - 21  16 (E)  Creatinine 0.5 - 1.1  0.8 (E)  Calcium 8.7 - 10.7  9.7 (E)  eGFR  >60 (E)  Alkaline Phosphatase 25 - 125  101 (E)  Albumin 3.5 - 5.0  4.0 (E)  AST 13 - 35  48 ! (E)  ALT 7 - 35 U/L 17 (E)  Bilirubin, Total  0.5 (E)  !: Data is abnormal (E): External lab result  11/06/21 00:00  CEA 7.4 (E)  (  E): External lab result  ASSESSMENT & PLAN:  A 77 y.o. 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  She also has a history of stage IIIA (T2 N1 M0) colon cancer, status post a right hemicolectomy in June 2022.  In clinic today, I went over all of her CT scan images with her, for which there was no obvious evidence of recurrence of either of her 2 previous cancers.  Furthermore, her CEA level of 7.4 is better than the 9.4 it was 5 months ago.  Overall, there is nothing to suggest there is disease recurrence of either her colon or lung cancer.  Moving forward, her CEA level will continue to be followed with each successive visit.  Repeat CT scans will be done before her next visit to ensure there remains no evidence of disease recurrence.  The patient understands all the plans discussed today and is in agreement with them.  Harvard Zeiss Macarthur Critchley, MD

## 2022-04-09 ENCOUNTER — Inpatient Hospital Stay: Payer: Medicare PPO | Admitting: Oncology

## 2022-04-09 ENCOUNTER — Other Ambulatory Visit: Payer: Self-pay | Admitting: Oncology

## 2022-04-09 ENCOUNTER — Inpatient Hospital Stay: Payer: Medicare PPO | Attending: Oncology

## 2022-04-09 DIAGNOSIS — C3432 Malignant neoplasm of lower lobe, left bronchus or lung: Secondary | ICD-10-CM | POA: Insufficient documentation

## 2022-04-09 DIAGNOSIS — C182 Malignant neoplasm of ascending colon: Secondary | ICD-10-CM

## 2022-04-09 LAB — CBC WITH DIFFERENTIAL (CANCER CENTER ONLY)
Abs Immature Granulocytes: 0.03 10*3/uL (ref 0.00–0.07)
Basophils Absolute: 0.1 10*3/uL (ref 0.0–0.1)
Basophils Relative: 1 %
Eosinophils Absolute: 0.4 10*3/uL (ref 0.0–0.5)
Eosinophils Relative: 5 %
HCT: 31.7 % — ABNORMAL LOW (ref 36.0–46.0)
Hemoglobin: 9.7 g/dL — ABNORMAL LOW (ref 12.0–15.0)
Immature Granulocytes: 0 %
Lymphocytes Relative: 10 %
Lymphs Abs: 0.8 10*3/uL (ref 0.7–4.0)
MCH: 31.4 pg (ref 26.0–34.0)
MCHC: 30.6 g/dL (ref 30.0–36.0)
MCV: 102.6 fL — ABNORMAL HIGH (ref 80.0–100.0)
Monocytes Absolute: 0.9 10*3/uL (ref 0.1–1.0)
Monocytes Relative: 12 %
Neutro Abs: 5.2 10*3/uL (ref 1.7–7.7)
Neutrophils Relative %: 72 %
Platelet Count: 265 10*3/uL (ref 150–400)
RBC: 3.09 MIL/uL — ABNORMAL LOW (ref 3.87–5.11)
RDW: 13.2 % (ref 11.5–15.5)
WBC Count: 7.3 10*3/uL (ref 4.0–10.5)
nRBC: 0 % (ref 0.0–0.2)

## 2022-04-09 LAB — CMP (CANCER CENTER ONLY)
ALT: 11 U/L (ref 0–44)
AST: 21 U/L (ref 15–41)
Albumin: 3.4 g/dL — ABNORMAL LOW (ref 3.5–5.0)
Alkaline Phosphatase: 105 U/L (ref 38–126)
Anion gap: 7 (ref 5–15)
BUN: 14 mg/dL (ref 8–23)
CO2: 29 mmol/L (ref 22–32)
Calcium: 9.2 mg/dL (ref 8.9–10.3)
Chloride: 100 mmol/L (ref 98–111)
Creatinine: 0.68 mg/dL (ref 0.44–1.00)
GFR, Estimated: >60 mL/min (ref >60)
Glucose, Bld: 103 mg/dL — ABNORMAL HIGH (ref 70–99)
Potassium: 4 mmol/L (ref 3.5–5.1)
Sodium: 136 mmol/L (ref 135–145)
Total Bilirubin: 0.4 mg/dL (ref 0.3–1.2)
Total Protein: 6.4 g/dL — ABNORMAL LOW (ref 6.5–8.1)

## 2022-04-11 LAB — CEA: CEA: 6.8 ng/mL — ABNORMAL HIGH (ref 0.0–4.7)

## 2022-04-14 ENCOUNTER — Telehealth: Payer: Self-pay | Admitting: Oncology

## 2022-04-14 ENCOUNTER — Inpatient Hospital Stay
Admission: RE | Admit: 2022-04-14 | Discharge: 2022-04-14 | Disposition: A | Discharge status: Home / Self Care | Payer: Self-pay | Source: Ambulatory Visit | Attending: Radiation Oncology | Admitting: Radiation Oncology

## 2022-04-14 NOTE — Telephone Encounter (Signed)
04/14/22 Spoke with patient and scheduled CT SCAN on 04/15/22 arrive at 1230pm.Appt at 1pm.

## 2022-04-16 ENCOUNTER — Ambulatory Visit: Payer: Medicare PPO | Admitting: Oncology

## 2022-04-16 NOTE — Progress Notes (Signed)
[facility]  [address] [place],  [place]  [address] [phone]  Clinic Day:  04/17/2022  Referring physician: Raina Mina., MD  HISTORY OF PRESENT ILLNESS:  The patient is a 77 y.o. female with stage IIIA (T2 N1 M0) colon cancer, status post a right hemicolectomy in June 2022.  Due to her poor baseline health, adjuvant chemotherapy was not entertained.  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  The patient completed stereotactic radiation for her lung cancer in October 2022.  The patient comes in today to go over her most recent scans for her cancer surveillance.  Since her last visit, the patient has been doing okay.  She has chronic shortness of breath for which she uses 3 L of oxygen.  However, she denies having any new respiratory symptoms which concern her for recurrent lung cancer.  She also denies having any new GI symptoms that concern her for recurrent colon cancer.    PHYSICAL EXAM:  Blood pressure 131/62, pulse 99, temperature 98.8 F (37.1 C), resp. rate 14, height 5' (1.524 m), SpO2 91 %. Wt Readings from Last 3 Encounters:  11/27/21 112 lb 3.2 oz (50.9 kg)  10/28/21 114 lb 6.4 oz (51.9 kg)  10/14/21 112 lb (50.8 kg)   Body mass index is 21.91 kg/m. Performance status (ECOG): 3 - Symptomatic, >50% confined to bed Physical Exam Constitutional:      Appearance: Normal appearance. She is ill-appearing.     Comments: Chronically-ill appearing woman who is in a wheelchair, wearing oxygen per nasal canula.  She looks weaker versus previous visits  HENT:     Mouth/Throat:     Mouth: Mucous membranes are moist.     Pharynx: Oropharynx is clear. No oropharyngeal exudate or posterior oropharyngeal erythema.  Cardiovascular:     Rate and Rhythm: Normal rate and regular rhythm.     Heart sounds: No murmur heard.    No friction rub. No gallop.  Pulmonary:     Effort:  Pulmonary effort is normal. No respiratory distress.     Breath sounds: Normal breath sounds. No wheezing, rhonchi or rales.     Comments: Diffusely decreased breath sounds bilaterally Abdominal:     General: Bowel sounds are normal. There is no distension.     Palpations: Abdomen is soft. There is no mass.     Tenderness: There is no abdominal tenderness.  Musculoskeletal:        General: No swelling.     Right lower leg: No edema.     Left lower leg: No edema.  Lymphadenopathy:     Cervical: No cervical adenopathy.     Upper Body:     Right upper body: No supraclavicular or axillary adenopathy.     Left upper body: No supraclavicular or axillary adenopathy.     Lower Body: No right inguinal adenopathy. No left inguinal adenopathy.  Skin:    General: Skin is warm.     Coloration: Skin is not jaundiced.     Findings: No lesion or rash.  Neurological:     General: No focal deficit present.     Mental Status: She is alert and oriented to person, place, and time. Mental status is at baseline.  Psychiatric:        Mood and Affect: Mood normal.        Behavior: Behavior normal.  Thought Content: Thought content normal.   SCANS:  Recent CT scans of her chest/abdomen/pelvis revealed the following: FINDINGS: CT CHEST FINDINGS  Cardiovascular: The heart size is normal. No substantial pericardial effusion. Coronary artery calcification is evident. Moderate atherosclerotic calcification is noted in the wall of the thoracic aorta.  Mediastinum/Nodes: No mediastinal lymphadenopathy. There is no hilar lymphadenopathy. The esophagus has normal imaging features. There is no axillary lymphadenopathy.  Lungs/Pleura: Centrilobular and paraseptal emphysema evident. Scattered pulmonary nodules in both lung apices are similar to prior with diffuse tree-in-bud nodularity in the lower right lung in similar appearance of architectural distortion/scarring in the left lower lobe in the  region of the fiducial marker.  2.1 x 1.5 cm right lower lobe paraspinal nodule on image 601/301 is progressive since 11/06/2021 when it was 1.5 x 1.1 cm when remeasured in a similar fashion.  2.2 x 0.8 cm nodular opacity in the lingula on image 68/301 is in an area of clustered nodularity on the prior study likely representing confluent infectious/inflammatory etiology.  Musculoskeletal: No worrisome lytic or sclerotic osseous abnormality. Degenerative changes with joint effusion noted right shoulder. Multiple nonacute posterior left rib fractures evident.  CT ABDOMEN PELVIS FINDINGS  Hepatobiliary: Stable tiny hypoattenuating liver lesions, too small to characterize. No followup imaging is recommended. There is no evidence for gallstones, gallbladder wall thickening, or pericholecystic fluid. No intrahepatic or extrahepatic biliary dilation.  Pancreas: No focal mass lesion. No dilatation of the main duct. No intraparenchymal cyst. No peripancreatic edema.  Spleen: No splenomegaly. No focal mass lesion.  Adrenals/Urinary Tract: No splenomegaly. No focal mass lesion.No adrenal nodule or mass. Kidneys unremarkable. No evidence for hydroureter. The urinary bladder appears normal for the degree of distention.  Stomach/Bowel: Stomach is moderately distended with food. Duodenum is normally positioned as is the ligament of Treitz. No small bowel wall thickening. No small bowel dilatation. Status post right hemicolectomy. No gross colonic mass. No colonic wall thickening.  Vascular/Lymphatic: There is moderate atherosclerotic calcification of the abdominal aorta without aneurysm. There is no gastrohepatic or hepatoduodenal ligament lymphadenopathy. No retroperitoneal or mesenteric lymphadenopathy.  Reproductive: Stable tiny simple benign appearing adnexal cyst. No followup imaging is recommended.  Other: No intraperitoneal free fluid.  Musculoskeletal: Imaging through the low  pelvis is markedly motion degraded. Multiple fractures are identified in the pubic rami bilaterally and left iliac bone, some of which were acute on the 12/30/2021 exam  IMPRESSION: 1. 2.1 x 1.5 cm right lower lobe paraspinal nodule is progressive since 11/06/2021 when it was 1.5 x 1.1 cm . This is concerning for neoplasm and may represent metastatic disease or metachronous primary. 2. 2.2 x 0.8 cm nodular opacity in the lingula is in an area of clustered nodularity on the prior study likely representing confluent infectious/inflammatory etiology. Close attention on follow-up recommended. 3. Diffuse tree-in-bud nodularity in the lower right lung . Imaging features likely reflect sequelae of chronic atypical infection. 4. Similar appearance of architectural distortion/scarring in the left lower lobe in the region of the fiducial marker 5. No evidence for metastatic disease in the abdomen or pelvis. 6. Multiple pelvic fractures in the pubic rami bilaterally and left iliac bone, some of which were acute on the 12/30/2021 exam. 7. Aortic Atherosclerosis (ICD10-I70.0) and Emphysema (ICD10-J43.9).  LABS:    Latest Reference Range & Units 04/09/22 10:37  Sodium 135 - 145 mmol/L 136  Potassium 3.5 - 5.1 mmol/L 4.0  Chloride 98 - 111 mmol/L 100  CO2 22 - 32 mmol/L 29  Glucose  70 - 99 mg/dL 103 (H)  BUN 8 - 23 mg/dL 14  Creatinine 0.44 - 1.00 mg/dL 0.68  Calcium 8.9 - 10.3 mg/dL 9.2  Anion gap 5 - 15  7  Alkaline Phosphatase 38 - 126 U/L 105  Albumin 3.5 - 5.0 g/dL 3.4 (L)  AST 15 - 41 U/L 21  ALT 0 - 44 U/L 11  Total Protein 6.5 - 8.1 g/dL 6.4 (L)  Total Bilirubin 0.3 - 1.2 mg/dL 0.4  (H): Data is abnormally high (L): Data is abnormally low  Latest Reference Range & Units 04/09/22 10:37  WBC 4.0 - 10.5 K/uL 7.3  RBC 3.87 - 5.11 MIL/uL 3.09 (L)  Hemoglobin 12.0 - 15.0 g/dL 9.7 (L)  HCT 36.0 - 46.0 % 31.7 (L)  MCV 80.0 - 100.0 fL 102.6 (H)  MCH 26.0 - 34.0 pg 31.4  MCHC 30.0 -  36.0 g/dL 30.6  RDW 11.5 - 15.5 % 13.2  Platelets 150 - 400 K/uL 265  nRBC 0.0 - 0.2 % 0.0  Neutrophils % 72  Lymphocytes % 10  Monocytes Relative % 12  Eosinophil % 5  Basophil % 1  Immature Granulocytes % 0  (L): Data is abnormally low (H): Data is abnormally high  ASSESSMENT & PLAN:  A 77 y.o. 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  She also has a history of stage IIIA (T2 N1 M0) colon cancer, status post a right hemicolectomy in June 2022.  In clinic today, I went over her CT scan images with her, for which she could see that her right lower lobe lung nodule adjacent to a posterior rib has grown slightly over time.  This lesion is suspicious for some type of malignancy, whether it be a metastatic or primary bronchogenic neoplasm.  I did speak with pulmonology, who has agreed to see this patient and evaluate her with a bronchoscopy with biopsy.  I will tentatively see this patient back in 4 weeks.  Hopefully, by then, the pathology from the biopsy will be back from which a treatment plan can be formulated. The patient understands all the plans discussed today and is in agreement with them.  Imagene Boss Macarthur Critchley, MD

## 2022-04-17 ENCOUNTER — Inpatient Hospital Stay (INDEPENDENT_AMBULATORY_CARE_PROVIDER_SITE_OTHER): Payer: Medicare PPO | Admitting: Oncology

## 2022-04-17 VITALS — BP 131/62 | HR 99 | Temp 98.8°F | Resp 14 | Ht 60.0 in

## 2022-04-17 DIAGNOSIS — C3432 Malignant neoplasm of lower lobe, left bronchus or lung: Secondary | ICD-10-CM

## 2022-04-21 ENCOUNTER — Encounter: Payer: Self-pay | Admitting: Pulmonary Disease

## 2022-04-21 ENCOUNTER — Ambulatory Visit (INDEPENDENT_AMBULATORY_CARE_PROVIDER_SITE_OTHER): Payer: Medicare PPO | Admitting: Pulmonary Disease

## 2022-04-21 VITALS — HR 95 | Ht 60.0 in | Wt 105.0 lb

## 2022-04-21 DIAGNOSIS — R918 Other nonspecific abnormal finding of lung field: Secondary | ICD-10-CM

## 2022-04-21 DIAGNOSIS — J449 Chronic obstructive pulmonary disease, unspecified: Secondary | ICD-10-CM | POA: Diagnosis not present

## 2022-04-21 DIAGNOSIS — C3432 Malignant neoplasm of lower lobe, left bronchus or lung: Secondary | ICD-10-CM

## 2022-04-21 NOTE — Progress Notes (Signed)
Synopsis: Referred in September 2020 for establish care with new pulmonologist former patient of Dr. Lake Bells, PCP: Raina Mina., MD  Subjective:   PATIENT ID: Desiree [name] GENDER: female DOB: [birth year], MRN: [id number]  Chief Complaint  Patient presents with   Follow-up    F/up    77 year old female past medical history of COPD, pulmonary embolism, rheumatoid arthritis, 5 mg prednisone plus Plaquenil, former patient of Dr. Lake Bells.  COPD currently managed with Trelegy.  Has had several images following lung nodules since 2017.  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  PET scan July 2019 showed FDG activity SUV 1.348 mm left lower lobe nodule.  IgE 158, Rast panel positive for cat dander, dog dander and Aspergillus fumigate [place].  Last evaluation included sputum cultures, fungus AFB.  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  Appears patient had exacerbation in February and was treated with Levaquin.  As for her RA she is currently on Plaquenil.  Overall she has seen a significant decline in her physical ability.  She states that she is really noticed a significant increase in her dyspnea over the past several months.  Otherwise her respiratory complaints related to her COPD have been stable.  She uses her Trelegy inhaler regularly.  She occasionally describes a central "abnormal feeling in her chest with exertion but most of the time this all dissipates with resting.  She states even the time yes tasks make her severely fatigued as come paired to previous.  Patient denies fevers chills weight loss hemoptysis.  OV 11/08/2018: (Via telephone visit).  Patient scheduled for telephone visit to review CT imaging.  Patient found to have a 2 x 1.3 cm nodule abutting the minor fissure discussed via telephone today.  OV 11/23/2018: Here today to review CT imaging in person.  Patient  had pulmonary function test completed today prior to today's office visit.  PFTs revealed a ratio of 62, FEV1 51% predicted..  Also to review her CT imaging.  Patient very anxious about all of this that is going on.  She recently changed from Trelegy to Spiriva plus Symbicort.  She is doing well with her new inhaler regimen.  She feels less dyspneic.  Patient denies fevers chills night sweats weight loss, cough or sputum production.  OV 08/16/2020: Here today for follow-up after recent hospitalization.  She has been followed throughout our clinic since 2020 via telemetry visits.  Please reference those separate encounters for separate follow-up.  She has a lung nodule that we have been following for some time.  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  CT imaging of the chest was completed on presentationWhich revealed enlargement of the lung nodule that we have been following.  This bilobed left lower lobe nodule had showed slow progression in size.  Since we have been following this for some time I suspect we are dealing with a separate malignancy in comparison to her recent colon cancer diagnosis.  Patient was taken to the operating room by general surgery and ultimately discharged on 08/05/2020.  Here today for follow-up.  Recently saw general surgery in follow-up.  She has some drainage from the incision site felt to be infected was started on antibiotics.  Respiratory standpoint she is  stable.  On 2 L nasal cannula continuous.  Currently residing in an assisted living.  Has follow-up scheduled with medical oncology soon.  OV 04/21/2022: Here today for follow-up.Patient here today after having a CT scan of the chest completed in March for follow-up with medical oncology.  Patient was found to have a 2.1 x 1.5 cm right lower lobe paraspinal  nodule that has been progressive since in October 2023 CT imaging which originally measured 1.5 in largest cross-section concerning for metastatic disease or metachronous primary.  Also has a nodular opacity within the lingula consistent with more of an infectious or inflammatory etiology.  Other scattered tree-in-bud disease no evidence of other distant metastatic disease.  From baseline she has 3 L pulse.  Frail, down to a weight of 105.  Using her inhalers regularly.  Lives in a skilled facility.     Past Medical History:  Diagnosis Date   Abnormal nuclear stress test    AF (paroxysmal atrial fibrillation) (HCC)    Age-related osteoporosis without current pathological fracture 05/16/2016   Alcoholism ([place]) 10/21/2018   Formatting of this note might be different from the original. 2 glasses a day.   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 Wrist surgery   Benign paroxysmal positional vertigo due to bilateral vestibular disorder 02/09/2020   Cancer of ascending colon [facility])    Carcinoma of ascending colon (HCC)    Cat allergies    Centrilobular emphysema ([employer]) 05/16/2016   Chronic anemia 04/09/2021   Chronic atrial fibrillation, unspecified ([place]) 10/04/2020   Chronic obstructive pulmonary disease ([place]) 04/09/2021   Chronic pain 04/09/2021   CKD (chronic kidney disease) stage 3, GFR 30-59 ml/min (HCC) 01/04/2018   COPD (chronic obstructive pulmonary disease) ([place])    Cysts of both ovaries 11/24/2019   Formatting of this note might be different from the original. On CT 11/2019. Referred to GYN.   Dyspnea    Early satiety 04/14/2019   Essential hypertension 08/22/2010   Formatting of this note might be different from the original. Formatting of this note might be different from the original. D/C ACE 08/22/2010 due to concerns  with freq aecopd/ pseudoasthma > resolved  Last Assessment & Plan:  Formatting of this note might be different from the original. Adequate control on present rx, reviewed - note she's no longer falling apart during her aecopd's which are less   Gastro-esophageal reflux disease without esophagitis 07/14/2017   Gastrointestinal hemorrhage    History of pulmonary embolus (PE) 07/14/2017   History of radiation therapy    Left lung- SBRT 11/13/20-11/27/20- Dr. 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   Lung cancer, lower lobe ([place]) 09/24/2020   Lung nodule, solitary 08/16/2020   Added automatically from request for surgery [id number]   [facility] and fatigue 07/14/2017   Malignant neoplasm of unspecified part of left bronchus or lung ([place]) 04/09/2021   Malignant tumor of ascending colon ([place]) 04/09/2021   Mild CAD 02/02/2019   Mild episode of recurrent major depressive disorder ([place]) 07/14/2017   Mixed hyperlipidemia 07/14/2017   Multiple lung nodules on CT 05/16/2016   Osteoporosis    Other cirrhosis of liver (  [place]) 11/24/2019   Formatting of this note might be different from the original. Possible???? See CT comments 11/2019.   Other vitamin B12 deficiency anemias 04/09/2021   Paroxysmal atrial fibrillation ([place]) 04/14/2019   Formatting of this note might be different from the original. [facility].   Prediabetes 07/14/2017   Pulmonary embolism ([place]) 2017   Rheumatoid arthritis ([place]) 09/20/2010   Dr Trudie Reed in [place].   Rheumatoid arthritis(714.0)    Dr Tobie Lords follows   Schatzki's ring    Senile purpura ([place]) 01/04/2018   Skin rash 07/14/2017   Bx'd Allergic. Likely meds.   Status post thoracentesis    Symptomatic anemia 07/25/2020   Type 2 diabetes mellitus without complications ([place])  43/15/4008   Vitamin B12 deficiency 07/14/2017   Vitamin D deficiency 04/09/2021     Family History  Problem Relation Age of Onset   Heart disease Father    Stroke Mother    Clotting disorder Mother    Heart disease Sister    Sleep apnea Sister    Emphysema Sister    Breast cancer Maternal Aunt    Emphysema Paternal Grandmother      Past Surgical History:  Procedure Laterality Date   BIOPSY  07/27/2020   Procedure: BIOPSY;  Surgeon: Gatha Mayer, MD;  Location: [place];  Service: Endoscopy;;   BRONCHIAL BIOPSY  09/11/2020   Procedure: BRONCHIAL BIOPSIES;  Surgeon: Garner Nash, DO;  Location: [place] END[facility]COPY;  Service: Pulmonary;;   BRONCHIAL BRUSHINGS  09/11/2020   Procedure: BRONCHIAL BRUSHINGS;  Surgeon: Garner Nash, DO;  Location: [place] [facility]COPY;  Service: Pulmonary;;   BRONCHIAL NEEDLE ASPIRATION BIOPSY  09/11/2020   Procedure: BRONCHIAL NEEDLE ASPIRATION BIOPSIES;  Surgeon: Garner Nash, DO;  Location: [place] [facility]COPY;  Service: Pulmonary;;   BRONCHIAL WASHINGS  09/11/2020   Procedure: BRONCHIAL WASHINGS;  Surgeon: Garner Nash, DO;  Location: [place];  Service: Pulmonary;;   COLONOSCOPY N/A 07/27/2020   Procedure: COLONOSCOPY;  Surgeon: Gatha Mayer, MD;  Location[facility] END[facility]COPY;  Service: Endoscopy;  Laterality: N/A;   CYSTECTOMY Right    breast   ESOPHAGOGASTRODUODENOSCOPY (EGD) WITH PROPOFOL N/A 07/27/2020   Procedure: ESOPHAGOGASTRODUODENOSCOPY (EGD) WITH PROPOFOL;  Surgeon: Gatha Mayer, MD;  Location: [place];  Service: Endoscopy;  Laterality: N/A;   FIDUCIAL MARKER PLACEMENT  09/11/2020   Procedure: FIDUCIAL MARKER PLACEMENT;  Surgeon: Garner Nash, DO;  Location: [place] ENDOSCOPY;  Service: Pulmonary;;   LAPAROSCOPIC RIGHT HEMI COLECTOMY Right 07/31/2020   Procedure: LAPAROSCOPIC ASSISSTED  RIGHT HEMI COLECTOMY;  Surgeon: Donnie Mesa, MD;  Location: [place];  Service: General;  Laterality: Right;   LEFT HEART CATH AND CORONARY ANGIOGRAPHY N/A  01/05/2019   Procedure: LEFT HEART CATH AND CORONARY ANGIOGRAPHY;  Surgeon: Jettie Booze, MD;  Location: [place] CV LAB;  Service: Cardiovascular;  Laterality: N/A;   SUBMUCOSAL TATTOO INJECTION  07/27/2020   Procedure: SUBMUCOSAL TATTOO INJECTION;  Surgeon: Gatha Mayer, MD;  Location: [place];  Service: Endoscopy;;   VIDEO BRONCHOSCOPY WITH ENDOBRONCHIAL NAVIGATION Left 09/11/2020   Procedure: VIDEO BRONCHOSCOPY WITH ENDOBRONCHIAL NAVIGATION;  Surgeon: Garner Nash, DO;  Location: [place];  Service: Pulmonary;  Laterality: Left;  ION   VIDEO BRONCHOSCOPY WITH RADIAL ENDOBRONCHIAL ULTRASOUND  09/11/2020   Procedure: RADIAL ENDOBRONCHIAL ULTRASOUND;  Surgeon: Garner Nash, DO;  Location: [place] ENDOSCOPY;  Service: Pulmonary;;    Social History   Socioeconomic History   Marital status: Widowed    Spouse name: Not on  file   Number of children: Not on file   Years of education: Not on file   Highest education level: Not on file  Occupational History   Occupation: retired  Tobacco Use   Smoking status: Former    Packs/day: 1.00    Years: 40.00    Additional pack years: 0.00    Total pack years: 40.00    Types: Cigarettes    Quit date: 02/03/2001    Years since quitting: 21.2   Smokeless tobacco: Never  Vaping Use   Vaping Use: Never used  Substance and Sexual Activity   Alcohol use: Yes    Alcohol/week: 2.0 standard drinks of alcohol    Types: 2 Standard drinks or equivalent per week    Comment: 2 drinks daily   Drug use: No   Sexual activity: Not on file  Other Topics Concern   Not on file  Social History Narrative   Tobacco use, amount per day now: NONE   Past tobacco use, amount per day: 1 PACK PER DAY   How many years did you use tobacco:TOO MANY STOPPED IN 2003   Alcohol use (drinks per week): 14   Diet: GOOD   Do you drink/eat things with caffeine: YES   Marital status: WIDOW                What year were you married? 1967   Do you live in a  house, apartment, assisted living, condo, trailer, etc.? HOUSE   Is it one or more stories? SPLIT LEVEL   How many persons live in your home? 1   Do you have pets in your home?( please list) NONE   Current or past profession: [employer]   Do you exercise?  NO                                Type and how often?   Do you have a living will? YES    Do you have a DNR form?  YES                                 If not, do you want to discuss one?   Do you have signed POA/HPOA forms?        ??              If so, please bring to you appointment      [facility] (05/16/16):   Originally from [place], [place]. 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 No pets currently. No bird, mold, or hot tub exposure.    Social Determinants of Health   Financial Resource Strain: Low Risk  (10/26/2020)   Overall Financial Resource Strain (CARDIA)    Difficulty of Paying Living Expenses: Not very hard  Food Insecurity: No Food Insecurity (10/26/2020)   Hunger Vital Sign    Worried About Running Out of Food in the Last Year: Never true    Ran Out of Food in the Last Year: Never true  Transportation Needs: Unmet Transportation Needs (10/26/2020)   PRAPARE - Hydrologist (Medical): Yes    Lack of Transportation (Non-Medical): No  Physical Activity: Not on file  Stress: Not on file  Social Connections: Socially Isolated (10/26/2020)   Social Connection and Isolation Panel [NHANES]  Frequency of Communication with Friends and Family: Twice a week    Frequency of Social Gatherings with Friends and Family: Never    Attends Religious Services: Never    Marine scientist or Organizations: No    Attends Archivist Meetings: Never    Marital Status: Widowed  Human resources officer Violence: Not on file     Allergies  Allergen Reactions   Other Shortness Of Breath    Cats     Outpatient Medications Prior  to Visit  Medication Sig Dispense Refill   acetaminophen (TYLENOL) 500 MG tablet Take 500 mg by mouth every 6 (six) hours as needed for moderate pain or headache.     albuterol (VENTOLIN HFA) 108 (90 Base) MCG/ACT inhaler Inhale 2 puffs into the lungs every 4 (four) hours as needed for shortness of breath (shortness of breath (related to COPD)).     amLODipine (NORVASC) 5 MG tablet Take 1 tablet (5 mg total) by mouth daily. Patient needs appointment for further refills. 1 st attempt 30 tablet 0   aspirin EC 81 MG tablet Take 81 mg by mouth daily.     atorvastatin (LIPITOR) 20 MG tablet Take 1 tablet (20 mg total) by mouth daily. Patient needs to keep appointment for further refills. 1 st attempt 30 tablet 0   Budeson-Glycopyrrol-Formoterol (BREZTRI AEROSPHERE) 160-9-4.8 MCG/ACT AERO Inhale 2 puffs into the lungs 2 (two) times daily. 10.7 g 4   carvedilol (COREG) 3.125 MG tablet Take 1 tablet (3.125 mg total) by mouth 2 (two) times daily with a meal. 60 tablet 0   Cholecalciferol (D3-1000) 25 MCG (1000 UT) capsule Take 1,000 Units by mouth daily.     diclofenac Sodium (VOLTAREN) 1 % GEL Apply 1 application. topically as needed (pain).     Ferrous Sulfate (IRON) 325 (65 Fe) MG TABS Take 1 tablet by mouth daily.     fexofenadine (ALLEGRA) 180 MG tablet Take 180 mg by mouth daily.     gabapentin (NEURONTIN) 100 MG capsule Take 100 mg by mouth 2 (two) times daily.     hydroxychloroquine (PLAQUENIL) 200 MG tablet Take 200 mg by mouth daily.  3   leflunomide (ARAVA) 10 MG tablet Take 10 mg by mouth daily.     lidocaine 4 % Place 1 patch onto the skin daily.     loperamide (IMODIUM A-D) 2 MG tablet Take 2 mg by mouth as needed for diarrhea or loose stools (MAX 10 PILLS/24 HRS.).     montelukast (SINGULAIR) 10 MG tablet Take 1 tablet (10 mg total) by mouth at bedtime. 90 tablet 1   omeprazole (PRILOSEC) 20 MG capsule Take 1 capsule (20 mg total) by mouth daily. 90 capsule 1   ondansetron (ZOFRAN-ODT) 4 MG  disintegrating tablet Take 1 tablet (4 mg total) by mouth every 8 (eight) hours as needed for nausea or vomiting. 10 tablet 0   predniSONE (DELTASONE) 5 MG tablet Take 5 mg by mouth daily with breakfast.     sertraline (ZOLOFT) 100 MG tablet Take 100 mg by mouth daily.      tiZANidine (ZANAFLEX) 2 MG tablet Take 2 mg by mouth 2 (two) times daily.     traMADol (ULTRAM) 50 MG tablet Take 50 mg by mouth 3 (three) times daily as needed for moderate pain (pain).     valsartan (DIOVAN) 320 MG tablet Take 320 mg by mouth in the morning.     vitamin B-12 (CYANOCOBALAMIN) 1000 MCG tablet Take 1,000 mcg by  mouth daily.      No facility-administered medications prior to visit.    Review of Systems  Constitutional:  Positive for malaise/fatigue. Negative for chills, fever and weight loss.  HENT:  Negative for hearing loss, sore throat and tinnitus.   Eyes:  Negative for blurred vision and double vision.  Respiratory:  Positive for shortness of breath. Negative for cough, hemoptysis, sputum production, wheezing and stridor.   Cardiovascular:  Negative for chest pain, palpitations, orthopnea, leg swelling and PND.  Gastrointestinal:  Negative for abdominal pain, constipation, diarrhea, heartburn, nausea and vomiting.  Genitourinary:  Negative for dysuria, hematuria and urgency.  Musculoskeletal:  Negative for joint pain and myalgias.  Skin:  Negative for itching and rash.  Neurological:  Negative for dizziness, tingling, weakness and headaches.  Endo/Heme/Allergies:  Negative for environmental allergies. Does not bruise/bleed easily.  Psychiatric/Behavioral:  Negative for depression. The patient is not nervous/anxious and does not have insomnia.   All other systems reviewed and are negative.    Objective:  Physical Exam Vitals reviewed.  Constitutional:      General: She is not in acute distress.    Appearance: She is well-developed.  HENT:     Head: Normocephalic and atraumatic.      Mouth/Throat:     Pharynx: No oropharyngeal exudate.  Eyes:     Conjunctiva/sclera: Conjunctivae normal.     Pupils: Pupils are equal, round, and reactive to light.  Neck:     Vascular: No JVD.     Trachea: No tracheal deviation.     Comments: Loss of supraclavicular fat Cardiovascular:     Rate and Rhythm: Normal rate and regular rhythm.     Heart sounds: S1 normal and S2 normal.     Comments: Distant heart tones Pulmonary:     Effort: No tachypnea or accessory muscle usage.     Breath sounds: No stridor. Decreased breath sounds (throughout all lung fields) present. No wheezing, rhonchi or rales.     Comments: Severely diminished breath sounds bilaterally Abdominal:     General: Bowel sounds are normal. There is no distension.     Palpations: Abdomen is soft.     Tenderness: There is no abdominal tenderness.  Musculoskeletal:        General: Deformity (muscle wasting ) present.  Skin:    General: Skin is warm and dry.     Capillary Refill: Capillary refill takes less than 2 seconds.     Findings: No rash.  Neurological:     Mental Status: She is alert and oriented to person, place, and time.  Psychiatric:        Behavior: Behavior normal.      Vitals:   04/21/22 1132  Pulse: 95  SpO2: 94%  Weight: 105 lb (47.6 kg)  Height: 5' (1.524 m)   94% on RA BMI Readings from Last 3 Encounters:  04/21/22 20.51 kg/m  04/17/22 21.91 kg/m  11/27/21 21.91 kg/m   Wt Readings from Last 3 Encounters:  04/21/22 105 lb (47.6 kg)  11/27/21 112 lb 3.2 oz (50.9 kg)  10/28/21 114 lb 6.4 oz (51.9 kg)     CBC    Component Value Date/Time   WBC 7.3 04/09/2022 1037   WBC 9.2 08/05/2020 0114   RBC 3.09 (L) 04/09/2022 1037   HGB 9.7 (L) 04/09/2022 1037   HGB 11.1 09/30/2019 1151   HCT 31.7 (L) 04/09/2022 1037   HCT 33.4 (L) 09/30/2019 1151   PLT 265 04/09/2022 1037  PLT 333 09/30/2019 1151   MCV 102.6 (H) 04/09/2022 1037   MCV 99 (H) 09/30/2019 1151   MCH 31.4 04/09/2022  1037   MCHC 30.6 04/09/2022 1037   RDW 13.2 04/09/2022 1037   RDW 12.3 09/30/2019 1151   LYMPHSABS 0.8 04/09/2022 1037   LYMPHSABS 0.6 (L) 10/03/2015 1512   MONOABS 0.9 04/09/2022 1037   EOSABS 0.4 04/09/2022 1037   EOSABS 0.2 10/03/2015 1512   BASOSABS 0.1 04/09/2022 1037   BASOSABS 0.0 10/03/2015 1512    Chest Imaging: CT chest 03/03/2017: Evidence of infectious bronchiolitis, 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  Moderate emphysema. The patient's images have been independently reviewed by me.    CT chest September 2020: Reviewed with patient today in the office.  Does have 2 cm right upper lobe nodule with area of inflammatory appearing groundglass around this. Also has evidence of MAI and cylindric bronchiectasis. The patient's images have been independently reviewed by me.    CT chest 07/28/2020: Small pleural effusion on the right side, slowly enlarging left lower lobe pulmonary nodule concerning for a primary bronchogenic carcinoma.  Has increased in size comparison to previous imaging. The patient's images have been independently reviewed by me.    CT chest September 2023: Paraspinal right lower lobe 1.4 cm nodule. The patient's images have been independently reviewed by me.    CT March 2024: Right paraspinal nodule increased in size now 2.5 cm. Other small nodules in the chest. The patient's images have been independently reviewed by me.    Pulmonary Functions Testing Results:    Latest Ref Rng & Units 11/23/2018   12:50 PM 09/05/2016   12:57 PM  PFT Results  FVC-Pre L 1.46  1.71   FVC-Predicted Pre % 59  67   FVC-Post L 1.52  1.75   FVC-Predicted Post % 61  69   Pre FEV1/FVC % % 63  66   Post FEV1/FCV % % 62  69   FEV1-Pre L 0.91  1.13   FEV1-Predicted Pre % 49  59   FEV1-Post L 0.94  1.21   DLCO uncorrected ml/min/mmHg 9.72  13.43   DLCO UNC% % 56  66   DLCO corrected ml/min/mmHg  14.03   DLCO COR  %Predicted %  69   DLVA Predicted % 72  86   TLC L 4.17  4.77   TLC % Predicted % 90  103   RV % Predicted % 113  128     FeNO: None   Pathology: None  07/31/2020: SURGICAL PATHOLOGY  THIS IS AN ADDENDUM REPORT CASE: [id number]  PATIENT: Karlisa Racicot  Surgical Pathology Report  ddendum   Reason for Addendum #1:  DNA Mismatch Repair IHC Results   Clinical History: right colon mass (cm)  FINAL MICROSCOPIC DIAGNOSIS:   A. COLON, RIGHT AND TERMINAL ILEUM, HEMI-COLECTOMY:  -  Adenocarcinoma, poorly differentiated, 3.4 cm  -  Metastatic carcinoma involving one of sixteen lymph nodes (1/16)  -  Margins uninvolved by carcinoma  -  Multiple tubular adenomas (0.8 cm; largest)  -  Benign appendix  -  See oncology table and comment below   Echocardiogram: None   Heart Catheterization: None     Assessment & Plan:     ICD-10-CM   1. Stage 2 moderate COPD by GOLD classification ([place])  J44.9     2. Multiple lung nodules on CT  R91.8     3. Malignant neoplasm of lower lobe  of left lung ([place])  C34.32        Discussion:  This is a 77 year old female, moderate COPD by PFT with FEV1 of 51% predicted on previous, now on pulse 3 L with chronic hypoxemic respiratory failure.  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  Had an enlarging nodule in the lung was taken for bronchoscopy and biopsy diagnosed with adenocarcinoma of the lung.  She underwent SBRT treatments.  Now has a enlarging nodule on the right lower lobe.  She was referred for discussion today for consideration of repeat biopsy.  I think she is high risk for consideration of repeat biopsy due to her medical comorbidities.  And current functional status.  Plan: I will send her back to radiation oncology for consideration of empiric radiation treatments. I would prefer not to consider general anesthesia and repeat biopsy if possible. She would be high risk for any type of  general anesthetic. I discussed these options with the patient as well as family present.  They are all in agreement of this plan. I will discuss case with radiation oncology.  I will let them decide if they think a repeat PET would be of utility. Thanks for the referral and follow-up.  Garner Nash, DO [facility] 04/21/2022 11:42 AM

## 2022-04-21 NOTE — Patient Instructions (Signed)
Thank you for visiting Dr. Valeta Harms at [facility]. Today we recommend the following:  We will get you an appt with Dr. Sondra Come to discuss radiation treatments empirically   Return if symptoms worsen or fail to improve.    Please do your part to reduce the spread of COVID-19.

## 2022-04-22 ENCOUNTER — Telehealth: Payer: Self-pay

## 2022-04-22 ENCOUNTER — Other Ambulatory Visit: Payer: Self-pay | Admitting: Radiation Oncology

## 2022-04-22 DIAGNOSIS — C3432 Malignant neoplasm of lower lobe, left bronchus or lung: Secondary | ICD-10-CM

## 2022-04-22 NOTE — Telephone Encounter (Signed)
Called patients  son  Gal Chmiel ) to make aware that Dr. Sondra Come will cancel CT  chest  and follow up appointment on 4/1 and order PET scan. Son requesting scan be performed in [place].  Randall Hiss also requesting he be notified so that he can coordinate transportation with [facility] once PET scan has been scheduled.

## 2022-04-23 ENCOUNTER — Telehealth: Payer: Self-pay | Admitting: *Deleted

## 2022-04-23 NOTE — Telephone Encounter (Signed)
Called patient's son to inform of mom's Pet Scan for 05-07-22- arrival time- 6:45 am @ [facility], patient to be NPO- 4 hrs. prior to test, due to patient being diabetic, lvm for a return call

## 2022-04-28 ENCOUNTER — Ambulatory Visit (HOSPITAL_COMMUNITY): Payer: Medicare PPO

## 2022-04-29 ENCOUNTER — Telehealth: Payer: Self-pay | Admitting: *Deleted

## 2022-04-29 NOTE — Telephone Encounter (Signed)
Called patient's son- Dorina Cassella and informed of mom's Pet Scan  for 05-19-22- arrival time- 9:30 am @ [facility], patient to have water only- 6 hrs. prior to test, patient to receive results for Dr. Sondra Come on 05-26-22 @ 11:45 am for results, spoke with patient's son Randall Hiss and he is aware of this scan and the fu appt. with Dr. Sondra Come

## 2022-05-05 ENCOUNTER — Ambulatory Visit: Payer: Self-pay | Admitting: Radiation Oncology

## 2022-05-12 ENCOUNTER — Ambulatory Visit: Payer: Self-pay | Admitting: Radiation Oncology

## 2022-05-15 ENCOUNTER — Ambulatory Visit: Payer: Medicare PPO | Admitting: Oncology

## 2022-05-19 ENCOUNTER — Encounter (HOSPITAL_COMMUNITY)
Admission: RE | Admit: 2022-05-19 | Discharge: 2022-05-19 | Disposition: A | Discharge status: Home / Self Care | Payer: Medicare PPO | Source: Ambulatory Visit | Attending: Radiation Oncology | Admitting: Radiation Oncology

## 2022-05-19 ENCOUNTER — Ambulatory Visit: Payer: Medicare PPO | Admitting: Oncology

## 2022-05-19 DIAGNOSIS — C3432 Malignant neoplasm of lower lobe, left bronchus or lung: Secondary | ICD-10-CM

## 2022-05-19 LAB — GLUCOSE, CAPILLARY: Glucose-Capillary: 91 mg/dL (ref 70–99)

## 2022-05-19 MED ORDER — FLUDEOXYGLUCOSE F - 18 (FDG) INJECTION
5.1900 | Freq: Once | INTRAVENOUS | Status: AC | PRN
  Administered 2022-05-19: 5.19 via INTRAVENOUS

## 2022-05-20 NOTE — Progress Notes (Signed)
[facility]  [address] [place],  [place]  [address] [phone]  Clinic Day:  05/21/2022  Referring physician: [name], MD  HISTORY OF PRESENT ILLNESS:  The patient is a 77 y.o. female whose scans recently showed a right lower lobe paravertebral mass that was highly concerning for a malignancy.  The plan was to have this lesion biopsied.  However, due to her poor lung status, it was felt a bronchoscopy would prove too risky.  This patient did undergo a PET scan earlier this week to better determine if her right lower lobe lesion represents.  Since her last visit, the patient has been doing fairly well.  She denies having any worsening respiratory issues which concern her for overt signs of lung cancer being present.    With respect to her cancer history, the patient was diagnosed with stage IIIA (T2 N1 M0) colon cancer, status post a right hemicolectomy in June 2022.  Due to her poor baseline health, adjuvant chemotherapy was not entertained.  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  The patient completed stereotactic radiation for her lung cancer in October 2022.  She has chronic shortness of breath for which she uses 3 L of oxygen.   PHYSICAL EXAM:  Blood pressure (!) 141/65, pulse (!) 101, temperature 97.6 F (36.4 C), resp. rate 14, height 5' (1.524 m), SpO2 90 %. Wt Readings from Last 3 Encounters:  04/21/22 105 lb (47.6 kg)  11/27/21 112 lb 3.2 oz (50.9 kg)  10/28/21 114 lb 6.4 oz (51.9 kg)   Body mass index is 20.51 kg/m. Performance status (ECOG): 3 - Symptomatic, >50% confined to bed Physical Exam Constitutional:      Appearance: Normal appearance. She is ill-appearing.     Comments: Chronically-ill appearing woman who is in a wheelchair, wearing oxygen per nasal canula.  She looks weaker versus previous visits  HENT:     Mouth/Throat:     Mouth: Mucous membranes  are moist.     Pharynx: Oropharynx is clear. No oropharyngeal exudate or posterior oropharyngeal erythema.  Cardiovascular:     Rate and Rhythm: Normal rate and regular rhythm.     Heart sounds: No murmur heard.    No friction rub. No gallop.  Pulmonary:     Effort: Pulmonary effort is normal. No respiratory distress.     Breath sounds: Normal breath sounds. No wheezing, rhonchi or rales.     Comments: Diffusely decreased breath sounds bilaterally Abdominal:     General: Bowel sounds are normal. There is no distension.     Palpations: Abdomen is soft. There is no mass.     Tenderness: There is no abdominal tenderness.  Musculoskeletal:        General: No swelling.     Right lower leg: No edema.     Left lower leg: No edema.  Lymphadenopathy:     Cervical: No cervical adenopathy.     Upper Body:     Right upper body: No supraclavicular or axillary adenopathy.     Left upper body: No supraclavicular or axillary adenopathy.     Lower Body: No right inguinal adenopathy. No left inguinal adenopathy.  Skin:    General: Skin is warm.     Coloration: Skin is not jaundiced.     Findings: No lesion or rash.  Neurological:     General: No focal deficit present.  Mental Status: She is alert and oriented to person, place, and time. Mental status is at baseline.  Psychiatric:        Mood and Affect: Mood normal.        Behavior: Behavior normal.        Thought Content: Thought content normal.   SCANS:  Her PET scan revealed the following: FINDINGS: Mediastinal blood pool activity: SUV max 2.19  Liver activity: SUV max NA .  NECK: 8 mm nodular thickening in the left posterior nasopharyngeal soft tissues has an SUV max of 6.72, image 21/4. New from previous exam. No tracer avid cervical lymph nodes.  Incidental CT findings: None.  CHEST: Status post wedge resection of previous left posterior lung nodule. Within the paravertebral right lower lobe there is a solid nodule measuring  1.5 x 1.2 cm which has an SUV max of 8.15, image 37/7. Within the anterior lingula there is a nodule measuring 1.9 x 0.8 cm with SUV max of 4.17, image 41/7.  Additional, smaller solid lung nodules are scattered throughout both lungs which are technically too small to reliably characterize by PET-CT. These nodules are new compared with 02/20/2021 but appear similar when compared with 10/29/2021. Index nodule within the apical segment of the right upper lobe measures 8 mm with SUV max of 1.16, image 13/7. Index nodule within the superior segment of the right lower lobe measures 5 mm with SUV max of 0.45.  Incidental CT findings: Extensive tree-in-bud nodularity throughout the right lower lobe and right upper lobe is identified and is favored to represent sequelae of inflammatory or infectious bronchiolitis. Findings may reflect sequelae of indolent atypical infection such as MAI.Marland Kitchen  Aortic atherosclerosis. Coronary artery calcifications.  ABDOMEN/PELVIS: No abnormal hypermetabolic activity within the liver, pancreas, adrenal glands, or spleen. No hypermetabolic lymph nodes in the abdomen or pelvis.  Incidental CT findings: Bilateral adnexal cysts are identified without corresponding increased tracer uptake on the PET-CT images. These appears similar to the previous exam. Right adnexal cyst measures 2.1 cm, unchanged. Left adnexal cyst measures 1.3 cm and is also unchanged.  SKELETON: No focal hypermetabolic activity to suggest skeletal metastasis.  Incidental CT findings: There are multiple acute/subacute right posterior and lateral rib fractures which are new compared with 04/15/2022. This includes right eighth through tenth lateral rib fractures and right eleventh posterior rib fracture. Corresponding increased uptake noted on the PET-CT images  Multiple healing fractures are identified involving the bony pelvis including bilateral superior and inferior pubic rami  fractures. Fracture of the left iliac bone is again seen and also appears to be healing with increased surrounding callus formation. 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  IMPRESSION: 1. Status post wedge resection of previous left posterior lung nodule. 2. There are 2 solid nodules within the right lower lobe and lingula which are tracer avid and are concerning for either metastatic disease or primary bronchogenic carcinoma. 3. Multiple additional small solid lung nodules are scattered throughout both lungs. These appear new compared with 02/20/2021 but appear relatively stable when compared with more remote comparison study from 10/23/2021. All measuring less than 1 cm these are technically too small to reliably characterize by PET-CT. 4. Extensive tree-in-bud nodularity throughout the right lower lobe and right upper lobe is identified and is favored to represent sequelae of inflammatory or infectious bronchiolitis. Findings may reflect sequelae of indolent atypical infection such as MAI. 5. New 8 mm nodular thickening in the left posterior nasopharyngeal soft tissues with increased  tracer uptake. This is nonspecific, but likely postinflammatory/infectious. Consider correlation with direct visualization. 6. Multiple acute/subacute right posterior and lateral rib fractures are identified which are new compared with 04/15/2022. This includes right eighth through tenth lateral rib fractures and right eleventh posterior rib fracture. 7. Multiple healing fractures involving the bony pelvis including bilateral superior and inferior pubic rami fractures, left sacral wing insufficiency fracture, and left iliac bone fracture. 8. Aortic Atherosclerosis (ICD10-I70.0).  LABS:    Latest Reference Range & Units 04/09/22 10:37  Sodium 135 - 145 mmol/L 136  Potassium 3.5 - 5.1 mmol/L 4.0  Chloride 98 - 111 mmol/L 100  CO2 22 - 32 mmol/L 29  Glucose 70 - 99  mg/dL 161 (H)  BUN 8 - 23 mg/dL 14  Creatinine 0.96 - 0.45 mg/dL 4.09  Calcium 8.9 - 81.1 mg/dL 9.2  Anion gap 5 - 15  7  Alkaline Phosphatase 38 - 126 U/L 105  Albumin 3.5 - 5.0 g/dL 3.4 (L)  AST 15 - 41 U/L 21  ALT 0 - 44 U/L 11  Total Protein 6.5 - 8.1 g/dL 6.4 (L)  Total Bilirubin 0.3 - 1.2 mg/dL 0.4  (H): Data is abnormally high (L): Data is abnormally low  Latest Reference Range & Units 04/09/22 10:37  WBC 4.0 - 10.5 K/uL 7.3  RBC 3.87 - 5.11 MIL/uL 3.09 (L)  Hemoglobin 12.0 - 15.0 g/dL 9.7 (L)  HCT 91.4 - 78.2 % 31.7 (L)  MCV 80.0 - 100.0 fL 102.6 (H)  MCH 26.0 - 34.0 pg 31.4  MCHC 30.0 - 36.0 g/dL 95.6  RDW 21.3 - 08.6 % 13.2  Platelets 150 - 400 K/uL 265  nRBC 0.0 - 0.2 % 0.0  Neutrophils % 72  Lymphocytes % 10  Monocytes Relative % 12  Eosinophil % 5  Basophil % 1  Immature Granulocytes % 0  (L): Data is abnormally low (H): Data is abnormally high  ASSESSMENT & PLAN:  A 77 y.o. 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  She also has a history of stage IIIA (T2 N1 M0) colon cancer, status post a right hemicolectomy in June 2022.  The patient now has a right lower lobe lung mass which, per her recent PET scan, appears to be malignant in nature.  As mentioned previously, she was deemed to be too unfit to undergo a bronchoscopy.  As right lower lobe lung lesion is hypermetabolic, she is scheduled to see radiation oncology next week to discuss potential stereotactic radiation to this lesion.  She also has another lesion in her anterior lingular region of her left lung which came back somewhat hypermetabolic.  I will leave it up to radiation oncology as to whether this lesion will be treated or followed over time.  As neither lesion was biopsied, it is uncertain as to whether these lesions represent primary bronchogenic carcinomas or metastatic disease.  The patient understands she will  continue to get CT scans over time to ensure these or other new lesions are not progressing to where the concern for metastatic disease would be high.  I will tentatively see this patient back in 3 months for repeat clinical assessment.  A chest CT will be done a day before that next visit to ascertain her new disease baseline.  The patient understands all the plans discussed today and is in agreement with them.  Loden Laurent Kirby Funk, MD

## 2022-05-21 ENCOUNTER — Inpatient Hospital Stay: Payer: Medicare PPO | Attending: Oncology | Admitting: Oncology

## 2022-05-21 VITALS — BP 141/65 | HR 101 | Temp 97.6°F | Resp 14 | Ht 60.0 in

## 2022-05-21 DIAGNOSIS — C3432 Malignant neoplasm of lower lobe, left bronchus or lung: Secondary | ICD-10-CM | POA: Diagnosis not present

## 2022-05-24 NOTE — Progress Notes (Signed)
Desiree [name] is here today for follow up post radiation to the lung.  Lung Side: left lung, patient completed treatment on 11/27/20.  Does the patient complain of any of the following: Pain:No Shortness of breath w/wo exertion: Yes currently on oxygen at 3 liters.  Cough: Dry cough Hemoptysis: No Pain with swallowing: No Swallowing/choking concerns: No Appetite: Good Weight:  Wt Readings from Last 3 Encounters:  05/26/22 104 lb 4 oz (47.3 kg)  04/21/22 105 lb (47.6 kg)  11/27/21 112 lb 3.2 oz (50.9 kg)   Energy Level: Low energy  Post radiation skin Changes: No    Additional comments if applicable:  BP 135/68 (BP Location: Left Arm, Patient Position: Sitting)   Pulse 89   Temp (!) 97 F (36.1 C) (Temporal)   Resp 18   Ht 5' (1.524 m)   Wt 104 lb 4 oz (47.3 kg)   SpO2 99%   BMI 20.36 kg/m

## 2022-05-26 ENCOUNTER — Ambulatory Visit
Admission: RE | Admit: 2022-05-26 | Discharge: 2022-05-26 | Disposition: A | Discharge status: Home / Self Care | Payer: Medicare PPO | Source: Ambulatory Visit | Attending: Radiation Oncology | Admitting: Radiation Oncology

## 2022-05-26 ENCOUNTER — Encounter: Payer: Self-pay | Admitting: Radiation Oncology

## 2022-05-26 ENCOUNTER — Ambulatory Visit
Admission: RE | Admit: 2022-05-26 | Discharge: 2022-05-26 | Disposition: A | Discharge status: Home / Self Care | Payer: Medicare PPO | Source: Ambulatory Visit | Admitting: Radiation Oncology

## 2022-05-26 VITALS — BP 135/68 | HR 89 | Temp 97.3°F | Resp 18

## 2022-05-26 VITALS — BP 135/68 | HR 89 | Temp 97.0°F | Resp 18 | Ht 60.0 in | Wt 104.2 lb

## 2022-05-26 DIAGNOSIS — Z7982 Long term (current) use of aspirin: Secondary | ICD-10-CM | POA: Insufficient documentation

## 2022-05-26 DIAGNOSIS — C3432 Malignant neoplasm of lower lobe, left bronchus or lung: Secondary | ICD-10-CM

## 2022-05-26 DIAGNOSIS — I700 Atherosclerosis of aorta: Secondary | ICD-10-CM | POA: Insufficient documentation

## 2022-05-26 DIAGNOSIS — Z923 Personal history of irradiation: Secondary | ICD-10-CM | POA: Insufficient documentation

## 2022-05-26 DIAGNOSIS — C182 Malignant neoplasm of ascending colon: Secondary | ICD-10-CM | POA: Insufficient documentation

## 2022-05-26 DIAGNOSIS — Z7969 Long term (current) use of other immunomodulators and immunosuppressants: Secondary | ICD-10-CM | POA: Diagnosis not present

## 2022-05-26 DIAGNOSIS — Z79899 Other long term (current) drug therapy: Secondary | ICD-10-CM | POA: Insufficient documentation

## 2022-05-26 DIAGNOSIS — C3431 Malignant neoplasm of lower lobe, right bronchus or lung: Secondary | ICD-10-CM

## 2022-05-26 DIAGNOSIS — C3492 Malignant neoplasm of unspecified part of left bronchus or lung: Secondary | ICD-10-CM

## 2022-05-26 DIAGNOSIS — Z7952 Long term (current) use of systemic steroids: Secondary | ICD-10-CM | POA: Diagnosis not present

## 2022-05-26 NOTE — Progress Notes (Signed)
Radiation Oncology         [phone]) [phone] ________________________________  Name: Desiree [name] MRN: [id number]  Date: 05/26/2022  DOB: 1945/09/30  Follow-Up New Visit   Outpatient   CC: [name], MD  Weston Settle, MD    ICD-10-CM   1. Malignant neoplasm of unspecified part of left bronchus or lung [C34.92]  C34.92       Diagnosis:  The primary encounter diagnosis was Malignant neoplasm of lower lobe of left lung (HCC). A diagnosis of Cancer of ascending colon [facility]) was also pertinent to this visit.   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 She was last seen here for follow-up on 10/28/21. She has not been able to keep her last several appointments.     Since her last visit, she has continued to follow with Dr. Melvyn Neth at the [facility]. 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 CT otherwise showed no acute processes or evidence of metastatic disease in the abdomen.   CT CAP on 04/15/22 demonstrated:  interval progression of the previously stable RLL paraspinal nodule, measuring 2.1 x 1.5 cm, previously 1.5 x 1.1 cm. CT also showed: a 2.2 cm nodular opacity in the lingula (in the area of clustered nodularity on the prior study) likely representing an infectious/inflammatory etiology; a diffuse tree-in-bud nodularity in the right lower lung likely reflecting a sequelae of chronic atypical infection; and stability of the distortion/scarring in the LLL. CT otherwise showed no evidence of metastatic disease in the abdomen or pelvis.   Accordingly, she was referred to Dr. Tonia Brooms on 04/21/22 for consideration of RLL biopsies. In light of her medical comorbidities, current functional status, and the subsequent risks they impose with general anesthesia, Dr. Tonia Brooms does not recommend tissue sampling. Given so, Dr. Tonia Brooms recommends consideration of empiric radiation therapy which we will discuss in detail today.   The patient recently presented for a restaging PET scan on 05/19/22 which demonstrated: 2 tracer avid solid nodules within the right lower lobe and lingula concerning for either metastatic disease or primary bronchogenic carcinoma; multiple additional stable small solid lung nodules scattered throughout both lungs measuring less than 1 cm;  extensive tree-in-bud nodularity throughout the right lower lobe and right upper lobe favored to represent sequelae of inflammatory or infectious bronchiolitis; and new nonspecific nodular thickening in the left posterior nasopharyngeal soft tissues measuring 8 mm, with increased tracer uptake (likely postinflammatory / infectious in etiology). PET also showed multiple new acute/subacute right posterior and lateral rib fractures, multiple healing fractures involving the bony pelvis including bilateral superior  and inferior pubic rami fractures, left sacral wing insufficiency fracture, and a left iliac bone fracture. These findings likely correlate with a recent fall the  patient had this past January.    On evaluation today the patient reports her breathing is stable with the use of 3 L of oxygen.  She denies any pain within the chest area significant cough or hemoptysis.   She resides in an assisted living facility.  Allergies:  is allergic to other.  Meds: Current Outpatient Medications  Medication Sig Dispense Refill   acetaminophen (TYLENOL) 500 MG tablet Take 500 mg by mouth every 6 (six) hours as needed for moderate pain or headache.     albuterol (VENTOLIN HFA) 108 (90 Base) MCG/ACT inhaler Inhale 2 puffs into the lungs every 4 (four) hours as needed for shortness of breath (shortness of breath (related to COPD)).     amLODipine (NORVASC) 5 MG tablet Take 1 tablet (5 mg total) by mouth daily. Patient needs appointment for further refills. 1 st attempt 30 tablet 0   aspirin EC 81 MG tablet Take 81 mg by mouth daily.     atorvastatin (LIPITOR) 20 MG tablet Take 1 tablet (20 mg total) by mouth daily. Patient needs to keep appointment for further refills. 1 st attempt 30 tablet 0   Budeson-Glycopyrrol-Formoterol (BREZTRI AEROSPHERE) 160-9-4.8 MCG/ACT AERO Inhale 2 puffs into the lungs 2 (two) times daily. 10.7 g 4   carvedilol (COREG) 3.125 MG tablet Take 1 tablet (3.125 mg total) by mouth 2 (two) times daily with a meal. 60 tablet 0   Cholecalciferol (D3-1000) 25 MCG (1000 UT) capsule Take 1,000 Units by mouth daily.     diclofenac Sodium (VOLTAREN) 1 % GEL Apply 1 application. topically as needed (pain).     Ferrous Sulfate (IRON) 325 (65 Fe) MG TABS Take 1 tablet by mouth daily.     fexofenadine (ALLEGRA) 180 MG tablet Take 180 mg by mouth daily.     gabapentin (NEURONTIN) 100 MG capsule Take 100 mg by mouth 2 (two) times daily.     hydroxychloroquine (PLAQUENIL) 200 MG tablet Take 200 mg by mouth daily.  3   leflunomide (ARAVA) 10 MG tablet Take 10 mg by mouth daily.     lidocaine 4 % Place 1 patch onto the skin daily.     loperamide (IMODIUM A-D) 2  MG tablet Take 2 mg by mouth as needed for diarrhea or loose stools (MAX 10 PILLS/24 HRS.).     montelukast (SINGULAIR) 10 MG tablet Take 1 tablet (10 mg total) by mouth at bedtime. 90 tablet 1   omeprazole (PRILOSEC) 20 MG capsule Take 1 capsule (20 mg total) by mouth daily. 90 capsule 1   ondansetron (ZOFRAN-ODT) 4 MG disintegrating tablet Take 1 tablet (4 mg total) by mouth every 8 (eight) hours as needed for nausea or vomiting. 10 tablet 0   predniSONE (DELTASONE) 5 MG tablet Take 5 mg by mouth daily with breakfast.     sertraline (ZOLOFT) 100 MG tablet Take 100 mg by mouth daily.      tiZANidine (ZANAFLEX) 2 MG tablet Take 2 mg by mouth 2 (two) times daily.     traMADol (ULTRAM) 50 MG tablet Take 50 mg by mouth 3 (three) times daily as needed for moderate pain (pain).     valsartan (DIOVAN) 320 MG tablet Take 320 mg by mouth in the morning.     vitamin B-12 (CYANOCOBALAMIN) 1000 MCG tablet Take 1,000 mcg by mouth daily.  No current facility-administered medications for this encounter.    Physical Findings: The patient is in no acute distress. Patient is alert and oriented.  In place at 3 L.  She remains in wheelchair for evaluation.  temperature is 97.3 F (36.3 C) (abnormal). Her blood pressure is 135/68 and her pulse is 89. Her respiration is 18 and oxygen saturation is 99%. .  Lungs are clear to auscultation bilaterally. Heart has regular rate and rhythm. No palpable cervical, supraclavicular, or axillary adenopathy. Abdomen soft, non-tender, normal bowel sounds.   Lab Findings: Lab Results  Component Value Date   WBC 7.3 04/09/2022   HGB 9.7 (L) 04/09/2022   HCT 31.7 (L) 04/09/2022   MCV 102.6 (H) 04/09/2022   PLT 265 04/09/2022    Radiographic Findings: NM PET Image Restag (PS) Skull Base To Thigh  Result Date: 05/21/2022 CLINICAL DATA:  Subsequent treatment strategy for non-small cell lung cancer. EXAM: NUCLEAR MEDICINE PET SKULL BASE TO THIGH TECHNIQUE: 5.19 mCi F-18  FDG was injected intravenously. Full-ring PET imaging was performed from the skull base to thigh after the radiotracer. CT data was obtained and used for attenuation correction and anatomic localization. Fasting blood glucose: 91 mg/dl COMPARISON:  CT 78/29/5621 FINDINGS: Mediastinal blood pool activity: SUV max 2.19 Liver activity: SUV max NA . NECK: 8 mm nodular thickening in the left posterior nasopharyngeal soft tissues has an SUV max of 6.72, image 21/4. New from previous exam. No tracer avid cervical lymph nodes. Incidental CT findings: None. CHEST: Status post wedge resection of previous left posterior lung nodule. Within the paravertebral right lower lobe there is a solid nodule measuring 1.5 x 1.2 cm which has an SUV max of 8.15, image 37/7. Within the anterior lingula there is a nodule measuring 1.9 x 0.8 cm with SUV max of 4.17, image 41/7. Additional, smaller solid lung nodules are scattered throughout both lungs which are technically too small to reliably characterize by PET-CT. These nodules are new compared with 02/20/2021 but appear similar when compared with 10/29/2021. Index nodule within the apical segment of the right upper lobe measures 8 mm with SUV max of 1.16, image 13/7. Index nodule within the superior segment of the right lower lobe measures 5 mm with SUV max of 0.45. Incidental CT findings: Extensive tree-in-bud nodularity throughout the right lower lobe and right upper lobe is identified and is favored to represent sequelae of inflammatory or infectious bronchiolitis. Findings may reflect sequelae of indolent atypical infection such as MAI. Aortic atherosclerosis.  Coronary artery calcifications. ABDOMEN/PELVIS: No abnormal hypermetabolic activity within the liver, pancreas, adrenal glands, or spleen. No hypermetabolic lymph nodes in the abdomen or pelvis. Incidental CT findings: Bilateral adnexal cysts are identified without corresponding increased tracer uptake on the PET-CT images.  These appears similar to the previous exam. Right adnexal cyst measures 2.1 cm, unchanged. Left adnexal cyst measures 1.3 cm and is also unchanged. SKELETON: No focal hypermetabolic activity to suggest skeletal metastasis. Incidental CT findings: There are multiple acute/subacute right posterior and lateral rib fractures which are new compared with 04/15/2022. This includes right eighth through tenth lateral rib fractures and right eleventh posterior rib fracture. Corresponding increased uptake noted on the PET-CT images Multiple healing fractures are identified involving the bony pelvis including bilateral superior and inferior pubic rami fractures. Fracture of the left iliac bone is again seen and also appears to be healing with increased surrounding callus formation. 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 IMPRESSION: 1. Status post wedge  resection of previous left posterior lung nodule. 2. There are 2 solid nodules within the right lower lobe and lingula which are tracer avid and are concerning for either metastatic disease or primary bronchogenic carcinoma. 3. Multiple additional small solid lung nodules are scattered throughout both lungs. These appear new compared with 02/20/2021 but appear relatively stable when compared with more remote comparison study from 10/23/2021. All measuring less than 1 cm these are technically too small to reliably characterize by PET-CT. 4. Extensive tree-in-bud nodularity throughout the right lower lobe and right upper lobe is identified and is favored to represent sequelae of inflammatory or infectious bronchiolitis. Findings may reflect sequelae of indolent atypical infection such as MAI. 5. New 8 mm nodular thickening in the left posterior nasopharyngeal soft tissues with increased tracer uptake. This is nonspecific, but likely postinflammatory/infectious. Consider correlation with direct visualization. 6. Multiple acute/subacute right posterior  and lateral rib fractures are identified which are new compared with 04/15/2022. This includes right eighth through tenth lateral rib fractures and right eleventh posterior rib fracture. 7. Multiple healing fractures involving the bony pelvis including bilateral superior and inferior pubic rami fractures, left sacral wing insufficiency fracture, and left iliac bone fracture. 8.  Aortic Atherosclerosis (ICD10-I70.0). Electronically Signed   By: Signa Kell M.D.   On: 05/21/2022 09:40    Impression:  The primary encounter diagnosis was Malignant neoplasm of lower lobe of left lung (HCC). A diagnosis of Cancer of ascending colon [facility]) was also pertinent to this visit.   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  In addition I  reviewed her previous radiation treatments and these 2 areas are outside of her previous radiation therapy fields and therefore she would be a candidate for additional radiation therapy.  I discussed with patient however that this will cause more scar tissue within her normal lung and potentially could worsen her breathing.  As above she is on 3 L of oxygen at this time.  I also discussed that without treatment,  these 2 areas will likely progress in size and cause potential problems down the road.  We also discussed repeating the chest CT scan in 3 months.  After careful consideration the patient would like to proceed with radiation treatments directed at her lesion in the right lower lobe.  This potentially could be treated with SBRT.  Lesion in the lingula is adjacent to the heart border and I doubt that we will be able to do SBRT treatments she would be a candidate for hypofractionated accelerated radiation therapy over approximately 10 treatments for this lesion.  Will see how everything sets up but at  least plan on treating the lesion in the right lower lobe.  Plan: She will return next week for simulation and planning.   35 minutes of total time was spent for this patient encounter, including preparation, face-to-face counseling with the patient and coordination of care, physical exam, and documentation of the encounter. ____________________________________  Billie Lade, PhD, MD  This document serves as a record of services personally performed by Antony Blackbird, MD. It was created on his behalf by Neena Rhymes, a trained medical scribe. The creation of this record is based on the scribe's personal observations and the provider's statements to them. This document has been checked and approved by the attending  provider.  

## 2022-05-26 NOTE — Progress Notes (Signed)
Desiree [name] is here today for follow up post radiation to the lung.  Lung Side: left lung, patient completed treatment on 11/27/20.  Does the patient complain of any of the following: Pain:No Shortness of breath w/wo exertion: Yes currently on oxygen at 3 liters.  Cough: Dry cough Hemoptysis: No Pain with swallowing: No Swallowing/choking concerns: No Appetite: Good Weight:  Wt Readings from Last 3 Encounters:  05/26/22 104 lb 4 oz (47.3 kg)  04/21/22 105 lb (47.6 kg)  11/27/21 112 lb 3.2 oz (50.9 kg)   Energy Level: Low energy  Post radiation skin Changes: No    Additional comments if applicable:   BP 135/68   Pulse 89   Temp (!) 97.3 F (36.3 C)   Resp 18   SpO2 99%

## 2022-05-29 ENCOUNTER — Telehealth: Payer: Self-pay | Admitting: Radiation Oncology

## 2022-05-29 NOTE — Telephone Encounter (Signed)
Pt's daughter LVM stating she needs to discuss upcoming treatment appts. VM forwarded via email to [facility] and [employer] to follow up with daughter.

## 2022-06-03 ENCOUNTER — Ambulatory Visit
Admission: RE | Admit: 2022-06-03 | Discharge: 2022-06-03 | Disposition: A | Discharge status: Home / Self Care | Payer: Medicare PPO | Source: Ambulatory Visit | Attending: Radiation Oncology | Admitting: Radiation Oncology

## 2022-06-03 ENCOUNTER — Other Ambulatory Visit: Payer: Self-pay

## 2022-06-03 DIAGNOSIS — C3432 Malignant neoplasm of lower lobe, left bronchus or lung: Secondary | ICD-10-CM | POA: Diagnosis present

## 2022-06-10 ENCOUNTER — Telehealth: Payer: Self-pay

## 2022-06-10 DIAGNOSIS — C3432 Malignant neoplasm of lower lobe, left bronchus or lung: Secondary | ICD-10-CM | POA: Diagnosis present

## 2022-06-10 DIAGNOSIS — R918 Other nonspecific abnormal finding of lung field: Secondary | ICD-10-CM | POA: Diagnosis present

## 2022-06-10 DIAGNOSIS — C3492 Malignant neoplasm of unspecified part of left bronchus or lung: Secondary | ICD-10-CM | POA: Insufficient documentation

## 2022-06-10 NOTE — Telephone Encounter (Signed)
Received call from patients daughter requesting assistance with transportation for patient for her first week of  radiation treatments on May 13-16th. Per daughter she can transport patient on May 17 and the following week. Patient currently living at [facility] in [place], called and spoke Presenter, [employer] at [place]. Per coordinator Chip Boer can only provide transportation 2 days per week.    Called [place] transportation coordinator, [facility] to assist patient with transportation the first week. Called and made daughter aware.

## 2022-06-15 ENCOUNTER — Emergency Department (HOSPITAL_COMMUNITY): Payer: Medicare PPO

## 2022-06-15 ENCOUNTER — Encounter (HOSPITAL_COMMUNITY): Payer: Self-pay

## 2022-06-15 ENCOUNTER — Other Ambulatory Visit: Payer: Self-pay

## 2022-06-15 ENCOUNTER — Observation Stay (HOSPITAL_COMMUNITY)
Admission: EM | Admit: 2022-06-15 | Discharge: 2022-06-19 | Disposition: A | Discharge status: Skilled Nursing Facility | Payer: Medicare PPO | Source: Skilled Nursing Facility | Attending: Internal Medicine | Admitting: Internal Medicine

## 2022-06-15 DIAGNOSIS — E782 Mixed hyperlipidemia: Secondary | ICD-10-CM | POA: Diagnosis present

## 2022-06-15 DIAGNOSIS — Z86711 Personal history of pulmonary embolism: Secondary | ICD-10-CM

## 2022-06-15 DIAGNOSIS — Z885 Allergy status to narcotic agent status: Secondary | ICD-10-CM

## 2022-06-15 DIAGNOSIS — Z1152 Encounter for screening for COVID-19: Secondary | ICD-10-CM | POA: Diagnosis not present

## 2022-06-15 DIAGNOSIS — K219 Gastro-esophageal reflux disease without esophagitis: Secondary | ICD-10-CM | POA: Diagnosis present

## 2022-06-15 DIAGNOSIS — I480 Paroxysmal atrial fibrillation: Secondary | ICD-10-CM | POA: Diagnosis present

## 2022-06-15 DIAGNOSIS — F0280 Dementia in other diseases classified elsewhere without behavioral disturbance: Secondary | ICD-10-CM | POA: Diagnosis present

## 2022-06-15 DIAGNOSIS — Z7951 Long term (current) use of inhaled steroids: Secondary | ICD-10-CM

## 2022-06-15 DIAGNOSIS — M810 Age-related osteoporosis without current pathological fracture: Secondary | ICD-10-CM | POA: Diagnosis present

## 2022-06-15 DIAGNOSIS — Z8249 Family history of ischemic heart disease and other diseases of the circulatory system: Secondary | ICD-10-CM

## 2022-06-15 DIAGNOSIS — Z79899 Other long term (current) drug therapy: Secondary | ICD-10-CM | POA: Diagnosis not present

## 2022-06-15 DIAGNOSIS — Z85038 Personal history of other malignant neoplasm of large intestine: Secondary | ICD-10-CM

## 2022-06-15 DIAGNOSIS — G8929 Other chronic pain: Secondary | ICD-10-CM | POA: Diagnosis present

## 2022-06-15 DIAGNOSIS — I10 Essential (primary) hypertension: Secondary | ICD-10-CM | POA: Diagnosis present

## 2022-06-15 DIAGNOSIS — Z9981 Dependence on supplemental oxygen: Secondary | ICD-10-CM

## 2022-06-15 DIAGNOSIS — Z7982 Long term (current) use of aspirin: Secondary | ICD-10-CM

## 2022-06-15 DIAGNOSIS — E119 Type 2 diabetes mellitus without complications: Secondary | ICD-10-CM | POA: Diagnosis not present

## 2022-06-15 DIAGNOSIS — Z803 Family history of malignant neoplasm of breast: Secondary | ICD-10-CM

## 2022-06-15 DIAGNOSIS — R5381 Other malaise: Secondary | ICD-10-CM | POA: Diagnosis present

## 2022-06-15 DIAGNOSIS — Z85118 Personal history of other malignant neoplasm of bronchus and lung: Secondary | ICD-10-CM | POA: Insufficient documentation

## 2022-06-15 DIAGNOSIS — E1122 Type 2 diabetes mellitus with diabetic chronic kidney disease: Secondary | ICD-10-CM | POA: Diagnosis present

## 2022-06-15 DIAGNOSIS — Z825 Family history of asthma and other chronic lower respiratory diseases: Secondary | ICD-10-CM

## 2022-06-15 DIAGNOSIS — E039 Hypothyroidism, unspecified: Secondary | ICD-10-CM | POA: Diagnosis present

## 2022-06-15 DIAGNOSIS — N3000 Acute cystitis without hematuria: Secondary | ICD-10-CM

## 2022-06-15 DIAGNOSIS — J449 Chronic obstructive pulmonary disease, unspecified: Secondary | ICD-10-CM | POA: Diagnosis not present

## 2022-06-15 DIAGNOSIS — N183 Chronic kidney disease, stage 3 unspecified: Secondary | ICD-10-CM | POA: Diagnosis present

## 2022-06-15 DIAGNOSIS — Z823 Family history of stroke: Secondary | ICD-10-CM

## 2022-06-15 DIAGNOSIS — Z923 Personal history of irradiation: Secondary | ICD-10-CM

## 2022-06-15 DIAGNOSIS — M069 Rheumatoid arthritis, unspecified: Secondary | ICD-10-CM | POA: Diagnosis not present

## 2022-06-15 DIAGNOSIS — C3490 Malignant neoplasm of unspecified part of unspecified bronchus or lung: Secondary | ICD-10-CM | POA: Diagnosis present

## 2022-06-15 DIAGNOSIS — R531 Weakness: Principal | ICD-10-CM

## 2022-06-15 DIAGNOSIS — I482 Chronic atrial fibrillation, unspecified: Secondary | ICD-10-CM | POA: Diagnosis present

## 2022-06-15 DIAGNOSIS — I129 Hypertensive chronic kidney disease with stage 1 through stage 4 chronic kidney disease, or unspecified chronic kidney disease: Secondary | ICD-10-CM | POA: Diagnosis present

## 2022-06-15 DIAGNOSIS — F1021 Alcohol dependence, in remission: Secondary | ICD-10-CM | POA: Diagnosis present

## 2022-06-15 DIAGNOSIS — Z510 Encounter for antineoplastic radiation therapy: Secondary | ICD-10-CM | POA: Diagnosis not present

## 2022-06-15 DIAGNOSIS — N390 Urinary tract infection, site not specified: Principal | ICD-10-CM | POA: Diagnosis present

## 2022-06-15 DIAGNOSIS — G9341 Metabolic encephalopathy: Secondary | ICD-10-CM | POA: Diagnosis present

## 2022-06-15 DIAGNOSIS — Z832 Family history of diseases of the blood and blood-forming organs and certain disorders involving the immune mechanism: Secondary | ICD-10-CM

## 2022-06-15 DIAGNOSIS — Z87891 Personal history of nicotine dependence: Secondary | ICD-10-CM

## 2022-06-15 DIAGNOSIS — B9620 Unspecified Escherichia coli [E. coli] as the cause of diseases classified elsewhere: Secondary | ICD-10-CM | POA: Diagnosis present

## 2022-06-15 DIAGNOSIS — G609 Hereditary and idiopathic neuropathy, unspecified: Secondary | ICD-10-CM | POA: Diagnosis present

## 2022-06-15 DIAGNOSIS — Z91048 Other nonmedicinal substance allergy status: Secondary | ICD-10-CM

## 2022-06-15 DIAGNOSIS — I2510 Atherosclerotic heart disease of native coronary artery without angina pectoris: Secondary | ICD-10-CM | POA: Diagnosis present

## 2022-06-15 HISTORY — DX: Urinary tract infection, site not specified: N39.0

## 2022-06-15 LAB — COMPREHENSIVE METABOLIC PANEL
ALT: 12 U/L (ref 0–44)
AST: 20 U/L (ref 15–41)
Albumin: 3.3 g/dL — ABNORMAL LOW (ref 3.5–5.0)
Alkaline Phosphatase: 89 U/L (ref 38–126)
Anion gap: 10 (ref 5–15)
BUN: 20 mg/dL (ref 8–23)
CO2: 31 mmol/L (ref 22–32)
Calcium: 9.1 mg/dL (ref 8.9–10.3)
Chloride: 92 mmol/L — ABNORMAL LOW (ref 98–111)
Creatinine, Ser: 0.62 mg/dL (ref 0.44–1.00)
GFR, Estimated: >60 mL/min (ref >60)
Glucose, Bld: 85 mg/dL (ref 70–99)
Potassium: 4.5 mmol/L (ref 3.5–5.1)
Sodium: 133 mmol/L — ABNORMAL LOW (ref 135–145)
Total Bilirubin: 0.6 mg/dL (ref 0.3–1.2)
Total Protein: 6.8 g/dL (ref 6.5–8.1)

## 2022-06-15 LAB — CBC WITH DIFFERENTIAL/PLATELET
Abs Immature Granulocytes: 0.04 10*3/uL (ref 0.00–0.07)
Basophils Absolute: 0.1 10*3/uL (ref 0.0–0.1)
Basophils Relative: 1 %
Eosinophils Absolute: 0.3 10*3/uL (ref 0.0–0.5)
Eosinophils Relative: 2 %
HCT: 33.5 % — ABNORMAL LOW (ref 36.0–46.0)
Hemoglobin: 10.2 g/dL — ABNORMAL LOW (ref 12.0–15.0)
Immature Granulocytes: 0 %
Lymphocytes Relative: 4 %
Lymphs Abs: 0.5 10*3/uL — ABNORMAL LOW (ref 0.7–4.0)
MCH: 30.8 pg (ref 26.0–34.0)
MCHC: 30.4 g/dL (ref 30.0–36.0)
MCV: 101.2 fL — ABNORMAL HIGH (ref 80.0–100.0)
Monocytes Absolute: 1 10*3/uL (ref 0.1–1.0)
Monocytes Relative: 8 %
Neutro Abs: 10.7 10*3/uL — ABNORMAL HIGH (ref 1.7–7.7)
Neutrophils Relative %: 85 %
Platelets: 227 10*3/uL (ref 150–400)
RBC: 3.31 MIL/uL — ABNORMAL LOW (ref 3.87–5.11)
RDW: 12.2 % (ref 11.5–15.5)
WBC: 12.6 10*3/uL — ABNORMAL HIGH (ref 4.0–10.5)
nRBC: 0 % (ref 0.0–0.2)

## 2022-06-15 LAB — TROPONIN I (HIGH SENSITIVITY)
Troponin I (High Sensitivity): 36 ng/L — ABNORMAL HIGH (ref <18)
Troponin I (High Sensitivity): 34 ng/L — ABNORMAL HIGH (ref <18)

## 2022-06-15 LAB — URINALYSIS, W/ REFLEX TO CULTURE (INFECTION SUSPECTED)
Bilirubin Urine: NEGATIVE
Glucose, UA: NEGATIVE mg/dL
Ketones, ur: NEGATIVE mg/dL
Nitrite: NEGATIVE
Specific Gravity, Urine: 1.025 (ref 1.005–1.030)
pH: 6 (ref 5.0–8.0)

## 2022-06-15 LAB — LACTIC ACID, PLASMA
Lactic Acid, Venous: 1 mmol/L (ref 0.5–1.9)
Lactic Acid, Venous: 1.2 mmol/L (ref 0.5–1.9)

## 2022-06-15 LAB — SARS CORONAVIRUS 2 BY RT PCR: SARS Coronavirus 2 by RT PCR: NEGATIVE

## 2022-06-15 LAB — BRAIN NATRIURETIC PEPTIDE: B Natriuretic Peptide: 208.9 pg/mL — ABNORMAL HIGH (ref 0.0–100.0)

## 2022-06-15 LAB — CULTURE, BLOOD (ROUTINE X 2)

## 2022-06-15 MED ORDER — PANTOPRAZOLE SODIUM 40 MG PO TBEC
40.0000 mg | DELAYED_RELEASE_TABLET | Freq: Every day | ORAL | Status: DC
  Administered 2022-06-15 – 2022-06-19 (×5): 40 mg via ORAL
  Filled 2022-06-15 (×5): qty 1

## 2022-06-15 MED ORDER — HYDROXYCHLOROQUINE SULFATE 200 MG PO TABS
200.0000 mg | ORAL_TABLET | Freq: Every day | ORAL | Status: DC
  Administered 2022-06-16 – 2022-06-19 (×4): 200 mg via ORAL
  Filled 2022-06-15 (×4): qty 1

## 2022-06-15 MED ORDER — SERTRALINE HCL 50 MG PO TABS
150.0000 mg | ORAL_TABLET | Freq: Every day | ORAL | Status: DC
  Administered 2022-06-15 – 2022-06-19 (×5): 150 mg via ORAL
  Filled 2022-06-15: qty 6
  Filled 2022-06-15 (×2): qty 3
  Filled 2022-06-15: qty 6
  Filled 2022-06-15 (×3): qty 3

## 2022-06-15 MED ORDER — ASPIRIN 81 MG PO TBEC
81.0000 mg | DELAYED_RELEASE_TABLET | Freq: Every day | ORAL | Status: DC
  Administered 2022-06-15 – 2022-06-19 (×5): 81 mg via ORAL
  Filled 2022-06-15 (×5): qty 1

## 2022-06-15 MED ORDER — TRAMADOL HCL 50 MG PO TABS
50.0000 mg | ORAL_TABLET | Freq: Three times a day (TID) | ORAL | Status: DC | PRN
  Administered 2022-06-16 – 2022-06-19 (×3): 50 mg via ORAL
  Filled 2022-06-15 (×3): qty 1

## 2022-06-15 MED ORDER — ACETAMINOPHEN 325 MG PO TABS: 650.0000 mg | ORAL_TABLET | Freq: Four times a day (QID) | ORAL | Status: DC | PRN

## 2022-06-15 MED ORDER — ACETAMINOPHEN 650 MG RE SUPP: 650.0000 mg | Freq: Four times a day (QID) | RECTAL | Status: DC | PRN

## 2022-06-15 MED ORDER — GABAPENTIN 100 MG PO CAPS
100.0000 mg | ORAL_CAPSULE | Freq: Two times a day (BID) | ORAL | Status: DC
  Administered 2022-06-15 – 2022-06-19 (×8): 100 mg via ORAL
  Filled 2022-06-15 (×8): qty 1

## 2022-06-15 MED ORDER — ALBUTEROL SULFATE (2.5 MG/3ML) 0.083% IN NEBU: 2.5000 mg | INHALATION_SOLUTION | RESPIRATORY_TRACT | Status: DC | PRN

## 2022-06-15 MED ORDER — MIRTAZAPINE 15 MG PO TABS
7.5000 mg | ORAL_TABLET | Freq: Every day | ORAL | Status: DC
  Administered 2022-06-15 – 2022-06-18 (×4): 7.5 mg via ORAL
  Filled 2022-06-15 (×4): qty 1

## 2022-06-15 MED ORDER — PREDNISONE 5 MG PO TABS: 5.0000 mg | ORAL_TABLET | Freq: Every day | ORAL | Status: DC

## 2022-06-15 MED ORDER — METOPROLOL TARTRATE 5 MG/5ML IV SOLN
5.0000 mg | Freq: Four times a day (QID) | INTRAVENOUS | Status: DC | PRN
  Administered 2022-06-18 – 2022-06-19 (×2): 5 mg via INTRAVENOUS
  Filled 2022-06-15 (×2): qty 5

## 2022-06-15 MED ORDER — MONTELUKAST SODIUM 10 MG PO TABS
10.0000 mg | ORAL_TABLET | Freq: Every day | ORAL | Status: DC
  Administered 2022-06-15 – 2022-06-18 (×4): 10 mg via ORAL
  Filled 2022-06-15 (×5): qty 1

## 2022-06-15 MED ORDER — ONDANSETRON HCL 4 MG/2ML IJ SOLN: 4.0000 mg | Freq: Four times a day (QID) | INTRAMUSCULAR | Status: DC | PRN

## 2022-06-15 MED ORDER — DOCUSATE SODIUM 100 MG PO CAPS
100.0000 mg | ORAL_CAPSULE | Freq: Two times a day (BID) | ORAL | Status: DC
  Administered 2022-06-15 – 2022-06-19 (×7): 100 mg via ORAL
  Filled 2022-06-15 (×7): qty 1

## 2022-06-15 MED ORDER — TRAZODONE HCL 50 MG PO TABS
25.0000 mg | ORAL_TABLET | Freq: Every evening | ORAL | Status: DC | PRN
  Administered 2022-06-16: 25 mg via ORAL
  Filled 2022-06-15: qty 1

## 2022-06-15 MED ORDER — POTASSIUM CHLORIDE IN NACL 20-0.9 MEQ/L-% IV SOLN
INTRAVENOUS | Status: AC
  Filled 2022-06-15 (×2): qty 1000

## 2022-06-15 MED ORDER — PREDNISONE 5 MG PO TABS
15.0000 mg | ORAL_TABLET | Freq: Every day | ORAL | Status: DC
  Administered 2022-06-15 – 2022-06-18 (×4): 15 mg via ORAL
  Filled 2022-06-15 (×4): qty 3

## 2022-06-15 MED ORDER — SODIUM CHLORIDE 0.9 % IV SOLN
1.0000 g | INTRAVENOUS | Status: DC
  Administered 2022-06-16 – 2022-06-18 (×3): 1 g via INTRAVENOUS
  Filled 2022-06-15 (×3): qty 10

## 2022-06-15 MED ORDER — MECLIZINE HCL 25 MG PO TABS: 12.5000 mg | ORAL_TABLET | Freq: Two times a day (BID) | ORAL | Status: DC | PRN

## 2022-06-15 MED ORDER — SODIUM CHLORIDE 0.9 % IV SOLN
1.0000 g | Freq: Once | INTRAVENOUS | Status: AC
  Administered 2022-06-15: 1 g via INTRAVENOUS
  Filled 2022-06-15: qty 10

## 2022-06-15 MED ORDER — POLYETHYLENE GLYCOL 3350 17 G PO PACK: 17.0000 g | PACK | Freq: Every day | ORAL | Status: DC | PRN

## 2022-06-15 MED ORDER — ENOXAPARIN SODIUM 40 MG/0.4ML IJ SOSY
40.0000 mg | PREFILLED_SYRINGE | Freq: Every day | INTRAMUSCULAR | Status: DC
  Administered 2022-06-15 – 2022-06-18 (×4): 40 mg via SUBCUTANEOUS
  Filled 2022-06-15 (×4): qty 0.4

## 2022-06-15 MED ORDER — AMLODIPINE BESYLATE 5 MG PO TABS
5.0000 mg | ORAL_TABLET | Freq: Every day | ORAL | Status: DC
  Administered 2022-06-15 – 2022-06-19 (×5): 5 mg via ORAL
  Filled 2022-06-15 (×5): qty 1

## 2022-06-15 MED ORDER — NYSTATIN 100000 UNIT/ML MT SUSP
5.0000 mL | Freq: Four times a day (QID) | OROMUCOSAL | Status: DC
  Administered 2022-06-15 – 2022-06-19 (×14): 500000 [IU] via ORAL
  Filled 2022-06-15 (×14): qty 5

## 2022-06-15 MED ORDER — CARVEDILOL 3.125 MG PO TABS
3.1250 mg | ORAL_TABLET | Freq: Two times a day (BID) | ORAL | Status: DC
  Administered 2022-06-15 – 2022-06-19 (×8): 3.125 mg via ORAL
  Filled 2022-06-15 (×8): qty 1

## 2022-06-15 MED ORDER — ATORVASTATIN CALCIUM 20 MG PO TABS
20.0000 mg | ORAL_TABLET | Freq: Every evening | ORAL | Status: DC
  Administered 2022-06-15 – 2022-06-18 (×4): 20 mg via ORAL
  Filled 2022-06-15 (×4): qty 1

## 2022-06-15 MED ORDER — ONDANSETRON HCL 4 MG PO TABS: 4.0000 mg | ORAL_TABLET | Freq: Four times a day (QID) | ORAL | Status: DC | PRN

## 2022-06-15 NOTE — ED Provider Notes (Signed)
[place] EMERGENCY DEPARTMENT AT [facility] Provider Note   CSN: [id number] Arrival date & time: 06/15/22  1005     History  Chief Complaint  Patient presents with   Weakness    Shannone Lovullo is a 77 y.o. female.  77 year old female with prior medical history as detailed below presents for evaluation.  Patient reports increased weakness times approximate 48 hours.  She denies pain.  She reports increasing fatigue and "not feeling well".  She resides primarily at [place].  She is on nasal cannula O2 at all times.  She reports that her breathing feels comfortable on her normal 2 L nasal cannula.  The history is provided by the patient and medical records.       Home Medications Prior to Admission medications   Medication Sig Start Date End Date Taking? Authorizing Provider  acetaminophen (TYLENOL) 500 MG tablet Take 500 mg by mouth every 6 (six) hours as needed for moderate pain or headache.    [provider]  albuterol (VENTOLIN HFA) 108 (90 Base) MCG/ACT inhaler Inhale 2 puffs into the lungs every 4 (four) hours as needed for shortness of breath (shortness of breath (related to COPD)).    [provider]  amLODipine (NORVASC) 5 MG tablet Take 1 tablet (5 mg total) by mouth daily. Patient needs appointment for further refills. 1 st attempt 10/02/20   Tobb, Kardie, DO  aspirin EC 81 MG tablet Take 81 mg by mouth daily.    [provider]  atorvastatin (LIPITOR) 20 MG tablet Take 1 tablet (20 mg total) by mouth daily. Patient needs to keep appointment for further refills. 1 st attempt 10/05/20   Tobb, Lavona Mound, DO  Budeson-Glycopyrrol-Formoterol (BREZTRI AEROSPHERE) 160-9-4.8 MCG/ACT AERO Inhale 2 puffs into the lungs 2 (two) times daily. 01/19/20   Icard, Rachel Bo, DO  carvedilol (COREG) 3.125 MG tablet Take 1 tablet (3.125 mg total) by mouth 2 (two) times daily with a meal. 08/13/20   Tobb, Kardie, DO  Cholecalciferol (D3-1000) 25 MCG  (1000 UT) capsule Take 1,000 Units by mouth daily.    [provider]  diclofenac Sodium (VOLTAREN) 1 % GEL Apply 1 application. topically as needed (pain).    [provider]  Ferrous Sulfate (IRON) 325 (65 Fe) MG TABS Take 1 tablet by mouth daily. 10/08/21   [provider]  fexofenadine (ALLEGRA) 180 MG tablet Take 180 mg by mouth daily.    [provider]  gabapentin (NEURONTIN) 100 MG capsule Take 100 mg by mouth 2 (two) times daily. 10/01/21   [provider]  hydroxychloroquine (PLAQUENIL) 200 MG tablet Take 200 mg by mouth daily. 09/17/15   [provider]  leflunomide (ARAVA) 10 MG tablet Take 10 mg by mouth daily. 04/05/21   [provider]  lidocaine 4 % Place 1 patch onto the skin daily. 10/11/21   [provider]  loperamide (IMODIUM A-D) 2 MG tablet Take 2 mg by mouth as needed for diarrhea or loose stools (MAX 10 PILLS/24 HRS.).    [provider]  montelukast (SINGULAIR) 10 MG tablet Take 1 tablet (10 mg total) by mouth at bedtime. 10/21/20   Icard, Rachel Bo, DO  omeprazole (PRILOSEC) 20 MG capsule Take 1 capsule (20 mg total) by mouth daily. 07/11/20   Icard, Rachel Bo, DO  ondansetron (ZOFRAN-ODT) 4 MG disintegrating tablet Take 1 tablet (4 mg total) by mouth every 8 (eight) hours as needed for nausea or vomiting. 08/05/20   Larita Fife,  Santina Evans, MD  predniSONE (DELTASONE) 5 MG tablet Take 5 mg by mouth daily with breakfast.    [provider]  sertraline (ZOLOFT) 100 MG tablet Take 100 mg by mouth daily.  07/27/10   [provider]  tiZANidine (ZANAFLEX) 2 MG tablet Take 2 mg by mouth 2 (two) times daily. 09/02/21   [provider]  traMADol (ULTRAM) 50 MG tablet Take 50 mg by mouth 3 (three) times daily as needed for moderate pain (pain).    [provider]  valsartan (DIOVAN) 320 MG tablet Take 320 mg by mouth in the morning.    [provider]  vitamin B-12  (CYANOCOBALAMIN) 1000 MCG tablet Take 1,000 mcg by mouth daily.     [provider]      Allergies    Other    Review of Systems   Review of Systems  All other systems reviewed and are negative.   Physical Exam Updated Vital Signs BP (!) 152/105 (BP Location: Left Arm)   Pulse (!) 43   Temp 98.2 F (36.8 C) (Oral)   Resp 20   Ht 5' (1.524 m)   Wt 47.3 kg   SpO2 98%   BMI 20.37 kg/m  Physical Exam Vitals and nursing note reviewed.  Constitutional:      General: She is not in acute distress.    Appearance: Normal appearance. She is well-developed.  HENT:     Head: Normocephalic and atraumatic.  Eyes:     Conjunctiva/sclera: Conjunctivae normal.     Pupils: Pupils are equal, round, and reactive to light.  Cardiovascular:     Rate and Rhythm: Normal rate and regular rhythm.     Heart sounds: Normal heart sounds.  Pulmonary:     Effort: Pulmonary effort is normal. No respiratory distress.     Comments: Lungs are predominantly clear, there are several scattered trace expiratory wheezes throughout Abdominal:     General: There is no distension.     Palpations: Abdomen is soft.     Tenderness: There is no abdominal tenderness.  Musculoskeletal:        General: No deformity. Normal range of motion.     Cervical back: Normal range of motion and neck supple.  Skin:    General: Skin is warm and dry.  Neurological:     General: No focal deficit present.     Mental Status: She is alert and oriented to person, place, and time.    ED Results / Procedures / Treatments   Labs (all labs ordered are listed, but only abnormal results are displayed) Labs Reviewed  CULTURE, BLOOD (ROUTINE X 2)  CULTURE, BLOOD (ROUTINE X 2)  SARS CORONAVIRUS 2 BY RT PCR  CBC WITH DIFFERENTIAL/PLATELET  COMPREHENSIVE METABOLIC PANEL  BRAIN NATRIURETIC PEPTIDE  LACTIC ACID, PLASMA  LACTIC ACID, PLASMA  URINALYSIS, W/ REFLEX TO CULTURE (INFECTION SUSPECTED)  I-STAT CHEM 8, ED   TROPONIN I (HIGH SENSITIVITY)    EKG EKG Interpretation  Date/Time:  Sunday Jun 15 2022 10:52:51 EDT Ventricular Rate:  96 PR Interval:  60 QRS Duration: 111 QT Interval:  367 QTC Calculation: 464 R Axis:   -49 Text Interpretation: Sinus rhythm Short PR interval LAD, consider left anterior fascicular block Confirmed by Kristine Royal [phone]) on 06/15/2022 11:03:43 AM  Radiology No results found.  Procedures Procedures    Medications Ordered in ED Medications - No data to display  ED Course/ Medical Decision Making/ A&P  Medical Decision Making Amount and/or Complexity of Data Reviewed Labs: ordered. Radiology: ordered.  Risk Decision regarding hospitalization.    Medical Screen Complete  This patient presented to the ED with complaint of weakness.  This complaint involves an extensive number of treatment options. The initial differential diagnosis includes, but is not limited to, infection, metabolic abnormality, etc.  This presentation is: Acute, Chronic, Self-Limited, Previously Undiagnosed, Uncertain Prognosis, Complicated, Systemic Symptoms, and Threat to Life/Bodily Function  Patient with multiple comorbidities presents with complaint of generalized weakness.  Patient's UA -obtained from In-and-Out catheterization -suggests acute UTI.  Antibiotics initiated for treatment of same.  Other screening labs are without significant abnormality.  Patient would benefit from admission for further workup and observation.  Hospitalist service made aware case will evaluate for same.    Additional history obtained:  External records from outside sources obtained and reviewed including prior ED visits and prior Inpatient records.    Lab Tests:  I ordered and personally interpreted labs.  The pertinent results include: BC, CMP, BNP, troponin, UA, lactic acid, COVID   Imaging Studies ordered:  I ordered imaging studies including  chest x-ray I independently visualized and interpreted obtained imaging which showed NAD I agree with the radiologist interpretation.   Cardiac Monitoring:  The patient was maintained on a cardiac monitor.  I personally viewed and interpreted the cardiac monitor which showed an underlying rhythm of: NSR   Medicines ordered:  I ordered medication including antibiotics for suspected UTI Reevaluation of the patient after these medicines showed that the patient: improved   Problem List / ED Course:  Weakness, suspected UTI   Reevaluation:  After the interventions noted above, I reevaluated the patient and found that they have: improved   Disposition:  After consideration of the diagnostic results and the patients response to treatment, I feel that the patent would benefit from admission.          Final Clinical Impression(s) / ED Diagnoses Final diagnoses:  Weakness  Urinary tract infection without hematuria, site unspecified    Rx / DC Orders ED Discharge Orders     None         Wynetta Fines, MD 06/15/22 [phone]

## 2022-06-15 NOTE — Progress Notes (Signed)
Patient arrived on unit.

## 2022-06-15 NOTE — ED Notes (Signed)
ED TO INPATIENT HANDOFF REPORT  Name/Age/Gender Rolland Bimler 77 y.o. female  Code Status    Code Status Orders  (From admission, onward)           Start     Ordered   06/15/22 1429  Full code  Continuous       Question:  By:  Answer:  Consent: discussion documented in EHR   06/15/22 1429           Code Status History     Date Active Date Inactive Code Status Order ID Comments User Context   07/25/2020 1731 08/05/2020 2111 DNR [id number]  Evelena Leyden, DO ED   01/05/2019 1209 01/05/2019 1840 Full Code [id number]  Corky Crafts, MD Inpatient   11/23/2014 1745 11/24/2014 1839 DNR [id number]  Aldean Baker, MD Inpatient       Home/SNF/Other SNF  Chief Complaint UTI (urinary tract infection) [N39.0]  Level of Care/Admitting Diagnosis ED Disposition     ED Disposition  Admit   Condition  --   Comment  Hospital Area: [facility] [[id number]]  Level of Care: Med-Surg [16]  May admit patient to [facility] or [facility] if equivalent level of care is available:: Yes  Covid Evaluation: Asymptomatic - no recent exposure (last 10 days) testing not required  Diagnosis: UTI (urinary tract infection) [027253]  Admitting Physician: Maryln Gottron [[id number]]  Attending Physician: Olexa.Dam, MIR [place] [[id number]]  Certification:: I certify this patient will need inpatient services for at least 2 midnights  Estimated Length of Stay: 3          Medical History Past Medical History:  Diagnosis Date   Abnormal nuclear stress test    AF (paroxysmal atrial fibrillation) (HCC)    Age-related osteoporosis without current pathological fracture 05/16/2016   Alcoholism (HCC) 10/21/2018   Formatting of this note might be different from the original. 2 glasses a day.   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 Wrist surgery   Benign paroxysmal positional vertigo due to bilateral vestibular disorder 02/09/2020   Cancer of ascending colon [facility])    Carcinoma of ascending colon (HCC)    Cat allergies    Centrilobular emphysema (HCC) 05/16/2016   Chronic anemia 04/09/2021   Chronic atrial fibrillation, unspecified (HCC) 10/04/2020   Chronic obstructive pulmonary disease (HCC) 04/09/2021   Chronic pain 04/09/2021   CKD (chronic kidney disease) stage 3, GFR 30-59 ml/min (HCC) 01/04/2018   COPD (chronic obstructive pulmonary disease) (HCC)    Cysts of both ovaries 11/24/2019   Formatting of this note might be different from the original. On CT 11/2019. Referred to GYN.   Dyspnea    Early satiety 04/14/2019   Essential hypertension 08/22/2010   Formatting of this note might be different from the original. Formatting of this note might be different from the original. D/C ACE 08/22/2010 due to concerns with freq aecopd/ pseudoasthma > resolved  Last Assessment & Plan:  Formatting of this note might be different from the original. Adequate control on present rx, reviewed - note she's no longer falling apart during her aecopd's which are less   Gastro-esophageal reflux disease without esophagitis 07/14/2017   Gastrointestinal hemorrhage    History of pulmonary embolus (PE) 07/14/2017   History of radiation therapy    Left lung- SBRT 11/13/20-11/27/20- Dr. 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   Lung cancer, lower lobe (HCC) 09/24/2020   Lung nodule, solitary 08/16/2020   Added automatically from request for surgery [id number]   [facility] and fatigue 07/14/2017   Malignant neoplasm of unspecified part of left bronchus or lung (HCC) 04/09/2021   Malignant tumor of ascending colon (HCC)  04/09/2021   Mild CAD 02/02/2019   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 Possible???? See CT comments 11/2019.   Other vitamin B12 deficiency anemias 04/09/2021   Paroxysmal atrial fibrillation (HCC) 04/14/2019   Formatting of this note might be different from the original. [facility].   Prediabetes 07/14/2017   Pulmonary embolism (HCC) 2017   Rheumatoid arthritis (HCC) 09/20/2010   Dr Nickola Major in [place].   Rheumatoid arthritis(714.0)    Dr Azzie Roup follows   Schatzki's ring    Senile purpura (HCC) 01/04/2018   Skin rash 07/14/2017   Bx'd Allergic. Likely meds.   Status post thoracentesis    Symptomatic anemia 07/25/2020   Type 2 diabetes mellitus without complications (HCC) 04/09/2021   Vitamin B12 deficiency 07/14/2017   Vitamin D deficiency 04/09/2021    Allergies Allergies  Allergen Reactions   Other Shortness Of Breath    Cats   Oxycodone Other (See Comments)    hallucinations    IV Location/Drains/Wounds Patient Lines/Drains/Airways Status     Active Line/Drains/Airways     Name Placement date Placement time Site Days   Peripheral IV 06/15/22 20 G Anterior;Left Forearm 06/15/22  1052  Forearm  less than 1            Labs/Imaging Results for orders placed or performed during the hospital encounter of 06/15/22 (from the past 48 hour(s))  CBC with Differential     Status: Abnormal   Collection Time: 06/15/22 10:47 AM  Result Value Ref Range   WBC 12.6 (H) 4.0 - 10.5 K/uL   RBC 3.31 (L) 3.87 - 5.11 MIL/uL   Hemoglobin 10.2 (L) 12.0 - 15.0 g/dL   HCT 04.5 (L) 40.9 - 81.1 %   MCV 101.2 (H) 80.0 - 100.0 fL   MCH 30.8 26.0 - 34.0 pg   MCHC 30.4 30.0 - 36.0 g/dL   RDW 91.4 78.2 - 95.6 %   Platelets 227 150 - 400 K/uL    nRBC 0.0 0.0 - 0.2 %   Neutrophils Relative % 85 %   Neutro Abs 10.7 (H) 1.7 - 7.7 K/uL   Lymphocytes Relative 4 %   Lymphs Abs 0.5 (L) 0.7 - 4.0 K/uL   Monocytes Relative 8 %   Monocytes Absolute 1.0 0.1 - 1.0 K/uL   Eosinophils Relative 2 %   Eosinophils Absolute 0.3 0.0 - 0.5 K/uL   Basophils Relative 1 %   Basophils Absolute 0.1 0.0 - 0.1 K/uL   Immature Granulocytes 0 %   Abs Immature Granulocytes 0.04 0.00 - 0.07 K/uL    Comment: Performed at [facility], [address] [place], [place] [address]  Troponin I (High Sensitivity)     Status: Abnormal   Collection  Time: 06/15/22 10:47 AM  Result Value Ref Range   Troponin I (High Sensitivity) 36 (H) <18 ng/L    Comment: (NOTE) Elevated high sensitivity troponin I (hsTnI) values and significant  changes across serial measurements may suggest ACS but many other  chronic and acute conditions are known to elevate hsTnI results.  Refer to the "Links" section for chest pain algorithms and additional  guidance. Performed at [facility], [address], [place], [place] [address]   Comprehensive metabolic panel     Status: Abnormal   Collection Time: 06/15/22 10:47 AM  Result Value Ref Range   Sodium 133 (L) 135 - 145 mmol/L   Potassium 4.5 3.5 - 5.1 mmol/L   Chloride 92 (L) 98 - 111 mmol/L   CO2 31 22 - 32 mmol/L   Glucose, Bld 85 70 - 99 mg/dL    Comment: Glucose reference range applies only to samples taken after fasting for at least 8 hours.   BUN 20 8 - 23 mg/dL   Creatinine, Ser 1.30 0.44 - 1.00 mg/dL   Calcium 9.1 8.9 - 86.5 mg/dL   Total Protein 6.8 6.5 - 8.1 g/dL   Albumin 3.3 (L) 3.5 - 5.0 g/dL   AST 20 15 - 41 U/L   ALT 12 0 - 44 U/L   Alkaline Phosphatase 89 38 - 126 U/L   Total Bilirubin 0.6 0.3 - 1.2 mg/dL   GFR, Estimated >78 >46 mL/min    Comment: (NOTE) Calculated using the CKD-EPI Creatinine Equation (2021)    Anion gap 10 5 - 15    Comment: Performed at [facility], [address] [place], [place] [address]  Brain natriuretic peptide     Status: Abnormal   Collection Time: 06/15/22 10:47 AM  Result Value Ref Range   B Natriuretic Peptide 208.9 (H) 0.0 - 100.0 pg/mL    Comment: Performed at [facility], [address], [place], [place] [address]  Lactic acid, plasma     Status: None   Collection Time: 06/15/22 11:30 AM  Result Value Ref Range   Lactic Acid, Venous 1.0 0.5 - 1.9 mmol/L    Comment: Performed at [facility], [address], [place], [place] [address]  SARS Coronavirus 2 by RT PCR (hospital order, performed in [facility] hospital lab) *cepheid single result test* Anterior Nasal Swab     Status: None   Collection Time: 06/15/22 11:43 AM   Specimen: Anterior Nasal Swab  Result Value Ref Range   SARS Coronavirus 2 by RT PCR NEGATIVE NEGATIVE    Comment: (NOTE) SARS-CoV-2 target nucleic acids are NOT DETECTED.  The SARS-CoV-2 RNA is generally detectable in upper and lower respiratory specimens during the acute phase of infection. The lowest concentration of SARS-CoV-2 viral copies this assay can detect is 250 copies / mL. A negative result does not preclude SARS-CoV-2 infection and should not be used as the sole basis for treatment or other patient management decisions.  A negative result may occur with improper specimen collection / handling, submission of specimen other than nasopharyngeal swab, presence of viral mutation(s) within the areas targeted by this assay, and inadequate number of viral copies (<250 copies / mL). A negative result must be combined with clinical observations, patient history, and epidemiological information.  Fact Sheet for Patients:   RoadLapTop.co.za  Fact Sheet for Healthcare Providers: http://kim-miller.com/  This test is not yet approved or  cleared by the [place] FDA  and has been  authorized for detection and/or diagnosis of SARS-CoV-2 by FDA under an Emergency Use Authorization (EUA).  This EUA will remain in effect (meaning this test can be used) for the duration of the COVID-19 declaration under Section 564(b)(1) of the Act, 21 U.S.C. section 360bbb-3(b)(1), unless the authorization is terminated or revoked sooner.  Performed at [facility], [address], [place], [place] [address]   Urinalysis, w/ Reflex to Culture (Infection Suspected) -Urine, Clean Catch     Status: Abnormal   Collection Time: 06/15/22 12:05 PM  Result Value Ref Range   Specimen Source URINE, CLEAN CATCH    Color, Urine YELLOW YELLOW   APPearance TURBID (A) CLEAR   Specific Gravity, Urine 1.025 1.005 - 1.030   pH 6.0 5.0 - 8.0   Glucose, UA NEGATIVE NEGATIVE mg/dL   Hgb urine dipstick TRACE (A) NEGATIVE   Bilirubin Urine NEGATIVE NEGATIVE   Ketones, ur NEGATIVE NEGATIVE mg/dL   Protein, ur TRACE (A) NEGATIVE mg/dL   Nitrite NEGATIVE NEGATIVE   Leukocytes,Ua TRACE (A) NEGATIVE   Squamous Epithelial / HPF 6-10 0 - 5 /HPF   WBC, UA 0-5 0 - 5 WBC/hpf    Comment: Reflex urine culture not performed if WBC <=10, OR if Squamous epithelial cells >5. If Squamous epithelial cells >5, suggest recollection.   RBC / HPF 0-5 0 - 5 RBC/hpf   Bacteria, UA MANY (A) NONE SEEN    Comment: Performed at [facility], [address] [place], [place] [address]  Troponin I (High Sensitivity)     Status: Abnormal   Collection Time: 06/15/22  1:00 PM  Result Value Ref Range   Troponin I (High Sensitivity) 34 (H) <18 ng/L    Comment: (NOTE) Elevated high sensitivity troponin I (hsTnI) values and significant  changes across serial measurements may suggest ACS but many other  chronic and acute conditions are known to elevate hsTnI results.  Refer to the "Links" section for chest pain algorithms and additional  guidance. Performed at [facility],  [address] [place], [place] [address]    DG Chest Port 1 View  Result Date: 06/15/2022 CLINICAL DATA:  Weakness. EXAM: PORTABLE CHEST 1 VIEW COMPARISON:  06/04/2022 FINDINGS: The lungs are clear without focal pneumonia, edema, pneumothorax or pleural effusion. Interstitial markings are diffusely coarsened with chronic features. Scattered areas of patchy airspace disease and architectural distortion/scarring. Cardiopericardial silhouette is at upper limits of normal for size. Bones are diffusely demineralized. Telemetry leads overlie the chest. IMPRESSION: No substantial change since 06/04/2022. Electronically Signed   By: Kennith Center M.D.   On: 06/15/2022 11:33    Pending Labs Unresulted Labs (From admission, onward)     Start     Ordered   06/16/22 0500  Basic metabolic panel  Tomorrow morning,   R        06/15/22 1429   06/16/22 0500  CBC  Tomorrow morning,   R        06/15/22 1429   06/15/22 1440  Urine Culture (for pregnant, neutropenic or urologic patients or patients with an indwelling urinary catheter)  (Urine Labs)  Once,   R       Question Answer Comment  Indication Dysuria   Patient immune status Immunocompromised      06/15/22 1439   06/15/22 1048  Culture, blood (routine x 2)  BLOOD CULTURE X 2,   R (with STAT occurrences)      06/15/22 1047  06/15/22 1047  Lactic acid, plasma  Now then every 2 hours,   R (with STAT occurrences)      06/15/22 1047            Vitals/Pain Today's Vitals   06/15/22 1031 06/15/22 1038 06/15/22 1158 06/15/22 1400  BP: (!) 152/105  139/62 131/78  Pulse: (!) 43  95 96  Resp: 20  (!) 21 18  Temp: 98.2 F (36.8 C)   98.6 F (37 C)  TempSrc: Oral   Oral  SpO2: 98%  100% 99%  Weight:  47.3 kg    Height:  5' (1.524 m)    PainSc:  0-No pain      Isolation Precautions No active isolations  Medications Medications  cefTRIAXone (ROCEPHIN) 1 g in sodium chloride 0.9 % 100 mL IVPB (has no administration in time range)   aspirin EC tablet 81 mg (has no administration in time range)  traMADol (ULTRAM) tablet 50 mg (has no administration in time range)  hydroxychloroquine (PLAQUENIL) tablet 200 mg (has no administration in time range)  atorvastatin (LIPITOR) tablet 20 mg (has no administration in time range)  amLODipine (NORVASC) tablet 5 mg (has no administration in time range)  carvedilol (COREG) tablet 3.125 mg (has no administration in time range)  mirtazapine (REMERON) tablet 7.5 mg (has no administration in time range)  sertraline (ZOLOFT) tablet 150 mg (has no administration in time range)  meclizine (ANTIVERT) tablet 12.5 mg (has no administration in time range)  pantoprazole (PROTONIX) EC tablet 40 mg (has no administration in time range)  gabapentin (NEURONTIN) capsule 100 mg (has no administration in time range)  montelukast (SINGULAIR) tablet 10 mg (has no administration in time range)  enoxaparin (LOVENOX) injection 40 mg (has no administration in time range)  0.9 % NaCl with KCl 20 mEq/ L  infusion (has no administration in time range)  acetaminophen (TYLENOL) tablet 650 mg (has no administration in time range)    Or  acetaminophen (TYLENOL) suppository 650 mg (has no administration in time range)  traZODone (DESYREL) tablet 25 mg (has no administration in time range)  docusate sodium (COLACE) capsule 100 mg (has no administration in time range)  polyethylene glycol (MIRALAX / GLYCOLAX) packet 17 g (has no administration in time range)  ondansetron (ZOFRAN) tablet 4 mg (has no administration in time range)    Or  ondansetron (ZOFRAN) injection 4 mg (has no administration in time range)  albuterol (PROVENTIL) (2.5 MG/3ML) 0.083% nebulizer solution 2.5 mg (has no administration in time range)  metoprolol tartrate (LOPRESSOR) injection 5 mg (has no administration in time range)  predniSONE (DELTASONE) tablet 15 mg (has no administration in time range)  cefTRIAXone (ROCEPHIN) 1 g in sodium  chloride 0.9 % 100 mL IVPB (0 g Intravenous Stopped 06/15/22 1454)    Mobility non-ambulatory

## 2022-06-15 NOTE — H&P (Addendum)
History and Physical  [name] ZOX:[id number] DOB: 1946/01/19 DOA: 06/15/2022  PCP: Patient, No Pcp Per   Chief Complaint: Weakness, confusion  HPI: Desiree [name] is a 77 y.o. female with medical history significant for colon cancer lung cancer treated in 2022, rheumatoid arthritis on Plaquenil and prednisone, prediabetes, essential hypertension, COPD chronically on 2 L nasal cannula oxygen and recently found to have new lung mass starting radiation this coming week being admitted to the hospital with UTI.  History provided by the patient as well as her daughter with whom I spoke over the phone.  Patient lives in assisted living facility[facility], apparently has been doing well until a couple days ago she started to feel very weak, lethargic, per daughter slightly more confused than usual.  Denies any complaints of pain, does not have any cough, shortness of breath, daughter reports nausea or vomiting, apparently the patient vomited 2 days ago at her facility when she first woke up, and once again this morning when she first woke up.  She has remained on her baseline 2 L nasal cannula oxygen.  ED Course: In the emergency department, lab work shows WBC 13, hemoglobin 10, sodium 133, normal renal function, normal LFTs.  BNP 209, initial troponin 36-second troponin 34.  Lactic acid 1.0.  Urinalysis consistent with UTI, patient was given some IV fluids, and empiric IV Rocephin.  Review of Systems: Please see HPI for pertinent positives and negatives. A complete 10 system review of systems are otherwise negative.  Past Medical History:  Diagnosis Date   Abnormal nuclear stress test    AF (paroxysmal atrial fibrillation) (HCC)    Age-related osteoporosis without current pathological fracture 05/16/2016   Alcoholism (HCC) 10/21/2018   Formatting of this note might be different from the original. 2 glasses a day.   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 Wrist surgery   Benign paroxysmal positional vertigo due to bilateral vestibular disorder 02/09/2020   Cancer of ascending colon [facility])    Carcinoma of ascending colon (HCC)    Cat allergies    Centrilobular emphysema (HCC) 05/16/2016   Chronic anemia 04/09/2021   Chronic atrial fibrillation, unspecified (HCC) 10/04/2020   Chronic obstructive pulmonary disease (HCC) 04/09/2021   Chronic pain 04/09/2021   CKD (chronic kidney disease) stage 3, GFR 30-59 ml/min (HCC) 01/04/2018   COPD (chronic obstructive pulmonary disease) (HCC)    Cysts of both ovaries 11/24/2019   Formatting of this note might be different from the original. On CT 11/2019. Referred to GYN.   Dyspnea    Early satiety 04/14/2019   Essential hypertension 08/22/2010   Formatting of this note might be different from the original. Formatting of this note might be different from the original. D/C ACE 08/22/2010 due to concerns with freq aecopd/ pseudoasthma > resolved  Last Assessment & Plan:  Formatting of this note might be different from the original. Adequate control on present rx, reviewed - note she's no longer falling apart during her aecopd's which are less   Gastro-esophageal reflux disease without esophagitis 07/14/2017   Gastrointestinal hemorrhage    History of pulmonary embolus (PE) 07/14/2017   History of radiation therapy    Left lung- SBRT 11/13/20-11/27/20- Dr. 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 Possible???? See CT comments 11/2019.   Other vitamin B12 deficiency anemias 04/09/2021   Paroxysmal atrial fibrillation (HCC) 04/14/2019   Formatting of this note might be different from the original. [facility].   Prediabetes 07/14/2017   Pulmonary embolism (HCC) 2017   Rheumatoid arthritis (HCC) 09/20/2010   Dr Nickola Major in [place].   Rheumatoid arthritis(714.0)    Dr Azzie Roup follows   Schatzki's ring    Senile purpura (HCC) 01/04/2018   Skin rash 07/14/2017   Bx'd Allergic. Likely meds.   Status post thoracentesis    Symptomatic anemia 07/25/2020   Type 2 diabetes mellitus without complications (HCC) 04/09/2021   Vitamin B12 deficiency 07/14/2017   Vitamin D deficiency 04/09/2021   Past Surgical History:  Procedure Laterality Date   BIOPSY  07/27/2020   Procedure: BIOPSY;  Surgeon: Iva Boop, MD;  Location: [facility] ENDOSCOPY;  Service: Endoscopy;;   BRONCHIAL BIOPSY  09/11/2020   Procedure: BRONCHIAL BIOPSIES;  Surgeon: Josephine Igo, DO;  Location: [facility] ENDOSCOPY;  Service: Pulmonary;;   BRONCHIAL BRUSHINGS  09/11/2020   Procedure: BRONCHIAL BRUSHINGS;  Surgeon: Josephine Igo, DO;  Location: [facility] ENDOSCOPY;  Service: Pulmonary;;   BRONCHIAL NEEDLE ASPIRATION BIOPSY  09/11/2020   Procedure: BRONCHIAL NEEDLE ASPIRATION BIOPSIES;  Surgeon: Josephine Igo, DO;  Location: [facility] ENDOSCOPY;  Service: Pulmonary;;   BRONCHIAL WASHINGS  09/11/2020   Procedure: BRONCHIAL WASHINGS;  Surgeon: Josephine Igo, DO;  Location: [facility] ENDOSCOPY;  Service: Pulmonary;;   COLONOSCOPY  N/A 07/27/2020   Procedure: COLONOSCOPY;  Surgeon: Iva Boop, MD;  Location: [facility] ENDOSCOPY;  Service: Endoscopy;  Laterality: N/A;   CYSTECTOMY Right    breast   ESOPHAGOGASTRODUODENOSCOPY (EGD) WITH PROPOFOL N/A 07/27/2020   Procedure: ESOPHAGOGASTRODUODENOSCOPY (EGD) WITH PROPOFOL;  Surgeon: Iva Boop, MD;  Location: [facility] ENDOSCOPY;  Service: Endoscopy;  Laterality: N/A;   FIDUCIAL MARKER PLACEMENT  09/11/2020   Procedure: FIDUCIAL MARKER PLACEMENT;  Surgeon: Josephine Igo, DO;  Location: [facility] ENDOSCOPY;  Service: Pulmonary;;   LAPAROSCOPIC RIGHT HEMI COLECTOMY Right 07/31/2020   Procedure: LAPAROSCOPIC ASSISSTED  RIGHT HEMI COLECTOMY;  Surgeon: Manus Rudd, MD;  Location: [facility] OR;  Service: General;  Laterality: Right;   LEFT HEART CATH AND CORONARY ANGIOGRAPHY N/A 01/05/2019   Procedure: LEFT HEART CATH AND CORONARY ANGIOGRAPHY;  Surgeon: Corky Crafts, MD;  Location: [facility] INVASIVE CV LAB;  Service: Cardiovascular;  Laterality: N/A;   SUBMUCOSAL TATTOO INJECTION  07/27/2020   Procedure: SUBMUCOSAL TATTOO INJECTION;  Surgeon: Iva Boop, MD;  Location: [facility] ENDOSCOPY;  Service: Endoscopy;;   VIDEO BRONCHOSCOPY WITH ENDOBRONCHIAL NAVIGATION Left 09/11/2020   Procedure: VIDEO BRONCHOSCOPY WITH ENDOBRONCHIAL NAVIGATION;  Surgeon: Josephine Igo, DO;  Location: [facility] ENDOSCOPY;  Service: Pulmonary;  Laterality: Left;  ION  VIDEO BRONCHOSCOPY WITH RADIAL ENDOBRONCHIAL ULTRASOUND  09/11/2020   Procedure: RADIAL ENDOBRONCHIAL ULTRASOUND;  Surgeon: Josephine Igo, DO;  Location: [facility] ENDOSCOPY;  Service: Pulmonary;;    Social History:  reports that she quit smoking about 21 years ago. Her smoking use included cigarettes. She has a 40.00 pack-year smoking history. She has never used smokeless tobacco. She reports current alcohol use of about 2.0 standard drinks of alcohol per week. She reports that she does not use drugs.   Allergies  Allergen Reactions   Other Shortness Of Breath    Cats    Oxycodone Other (See Comments)    hallucinations    Family History  Problem Relation Age of Onset   Heart disease Father    Stroke Mother    Clotting disorder Mother    Heart disease Sister    Sleep apnea Sister    Emphysema Sister    Breast cancer Maternal Aunt    Emphysema Paternal Grandmother      Prior to Admission medications   Medication Sig Start Date End Date Taking? Authorizing Provider  acetaminophen (TYLENOL) 325 MG tablet Take 325 mg by mouth every 6 (six) hours as needed for moderate pain or headache.   Yes [provider]  albuterol (VENTOLIN HFA) 108 (90 Base) MCG/ACT inhaler Inhale 2 puffs into the lungs every 4 (four) hours as needed for shortness of breath (shortness of breath (related to COPD)).   Yes [provider]  amLODipine (NORVASC) 5 MG tablet Take 1 tablet (5 mg total) by mouth daily. Patient needs appointment for further refills. 1 st attempt 10/02/20  Yes Tobb, Kardie, DO  aspirin EC 81 MG tablet Take 81 mg by mouth daily.   Yes [provider]  atorvastatin (LIPITOR) 20 MG tablet Take 1 tablet (20 mg total) by mouth daily. Patient needs to keep appointment for further refills. 1 st attempt Patient taking differently: Take 20 mg by mouth every evening. Patient needs to keep appointment for further refills. 1 st attempt 10/05/20  Yes Tobb, Kardie, DO  Budeson-Glycopyrrol-Formoterol (BREZTRI AEROSPHERE) 160-9-4.8 MCG/ACT AERO Inhale 2 puffs into the lungs 2 (two) times daily. 01/19/20  Yes Icard, Rachel Bo, DO  carvedilol (COREG) 3.125 MG tablet Take 1 tablet (3.125 mg total) by mouth 2 (two) times daily with a meal. 08/13/20  Yes Tobb, Kardie, DO  Cholecalciferol (D3-1000) 25 MCG (1000 UT) capsule Take 1,000 Units by mouth daily.   Yes [provider]  Ferrous Sulfate (IRON) 325 (65 Fe) MG TABS Take 1 tablet by mouth daily. 10/08/21  Yes [provider]  fexofenadine (ALLEGRA) 180 MG tablet Take 180 mg by mouth daily.    Yes [provider]  gabapentin (NEURONTIN) 100 MG capsule Take 100 mg by mouth 2 (two) times daily. 10/01/21  Yes [provider]  hydroxychloroquine (PLAQUENIL) 200 MG tablet Take 200 mg by mouth daily. 09/17/15  Yes [provider]  leflunomide (ARAVA) 10 MG tablet Take 10 mg by mouth daily. 04/05/21  Yes [provider]  lidocaine 4 % Place 1 patch onto the skin daily. 10/11/21  Yes [provider]  loperamide (IMODIUM A-D) 2 MG tablet Take 2 mg by mouth as needed for diarrhea or loose stools (MAX 10 PILLS/24 HRS.).   Yes [provider]  meclizine (ANTIVERT) 12.5 MG tablet Take 12.5 mg by mouth 2 (two) times daily as needed for dizziness. 05/20/22  Yes [provider]  mirtazapine (REMERON) 7.5 MG tablet Take 7.5 mg  by mouth at bedtime.   Yes [provider]  montelukast (SINGULAIR) 10 MG tablet Take 1 tablet (10 mg total) by mouth at bedtime. 10/21/20  Yes Icard, Rachel Bo, DO  omeprazole (PRILOSEC) 20 MG capsule Take 1 capsule (20 mg total) by mouth daily. 07/11/20  Yes Icard, Bradley L, DO  ondansetron (ZOFRAN-ODT) 4 MG disintegrating tablet Take 1 tablet (4 mg total) by mouth every 8 (eight) hours as needed for nausea or vomiting. 08/05/20  Yes Fayette Pho, MD  OYSTERCAL-D 500-10 MG-MCG TABS Take 1 tablet by mouth daily. 03/06/22  Yes [provider]  predniSONE (DELTASONE) 5 MG tablet Take 5 mg by mouth daily with breakfast.   Yes [provider]  Sertraline HCl 150 MG CAPS Take 1 capsule by mouth daily. 04/09/22  Yes [provider]  tiZANidine (ZANAFLEX) 2 MG tablet Take 2 mg by mouth 2 (two) times daily. 09/02/21  Yes [provider]  traMADol (ULTRAM) 50 MG tablet Take 50 mg by mouth 3 (three) times daily as needed for moderate pain (pain).   Yes [provider]  valsartan (DIOVAN) 320 MG tablet Take 320 mg by mouth in the morning.   Yes [provider]  vitamin B-12  (CYANOCOBALAMIN) 1000 MCG tablet Take 1,000 mcg by mouth daily.    Yes [provider]    Physical Exam: BP 139/62   Pulse 95   Temp 98.2 F (36.8 C) (Oral)   Resp (!) 21   Ht 5' (1.524 m)   Wt 47.3 kg   SpO2 100%   BMI 20.37 kg/m   General:  Alert, oriented, calm, in no acute distress, looks dry on exam, Eyes: EOMI, clear conjuctivae, white sclerea Neck: supple, no masses, trachea mildline  Cardiovascular: RRR, no murmurs or rubs, no peripheral edema  Respiratory: clear to auscultation bilaterally, no wheezes, no crackles, wearing her chronic 2L Abdomen: soft, nontender, nondistended, normal bowel tones heard  Skin: dry, no rashes  Musculoskeletal: no joint effusions, normal range of motion  Psychiatric: appropriate affect, normal speech  Neurologic: extraocular muscles intact, clear speech, moving all extremities with intact sensorium          Labs on Admission:  Basic Metabolic Panel: Recent Labs  Lab 06/15/22 1047  NA 133*  K 4.5  CL 92*  CO2 31  GLUCOSE 85  BUN 20  CREATININE 0.62  CALCIUM 9.1   Liver Function Tests: Recent Labs  Lab 06/15/22 1047  AST 20  ALT 12  ALKPHOS 89  BILITOT 0.6  PROT 6.8  ALBUMIN 3.3*   No results for input(s): "LIPASE", "AMYLASE" in the last 168 hours. No results for input(s): "AMMONIA" in the last 168 hours. CBC: Recent Labs  Lab 06/15/22 1047  WBC 12.6*  NEUTROABS 10.7*  HGB 10.2*  HCT 33.5*  MCV 101.2*  PLT 227   Cardiac Enzymes: No results for input(s): "CKTOTAL", "CKMB", "CKMBINDEX", "TROPONINI" in the last 168 hours.  BNP (last 3 results) Recent Labs    06/15/22 1047  BNP 208.9*    ProBNP (last 3 results) No results for input(s): "PROBNP" in the last 8760 hours.  CBG: No results for input(s): "GLUCAP" in the last 168 hours.  Radiological Exams on Admission: DG Chest Port 1 View  Result Date: 06/15/2022 CLINICAL DATA:  Weakness. EXAM: PORTABLE CHEST 1 VIEW COMPARISON:  06/04/2022  FINDINGS: The lungs are clear without focal pneumonia, edema, pneumothorax or pleural effusion. Interstitial markings are diffusely coarsened with chronic features. Scattered areas  of patchy airspace disease and architectural distortion/scarring. Cardiopericardial silhouette is at upper limits of normal for size. Bones are diffusely demineralized. Telemetry leads overlie the chest. IMPRESSION: No substantial change since 06/04/2022. Electronically Signed   By: Kennith Center M.D.   On: 06/15/2022 11:33    Assessment/Plan   UTI (urinary tract infection)-this would explain her lethargy, increasing confusion, and vomiting.  She is not septic. -Inpatient admission -Follow blood and urine culture -Empiric Rocephin 1 g IV daily    COPD (chronic obstructive pulmonary disease) (HCC)-no evidence of exacerbation, continue supplemental oxygen 2 L/min    Gastro-esophageal reflux disease without esophagitis-continue home oral PPI   Rheumatoid arthritis (HCC)-continue Plaquenil, prednisone but will increase to 15 mg p.o. daily (would plan for next 3 days) in setting of acute infection as stress dose    Essential hypertension-Coreg, amlodipine, hold losartan for now since blood pressure normal; can restart in the morning if hypertensive  Hyperlipidemia-continue home atorvastatin 20 mg nightly  DVT prophylaxis: Lovenox     Code Status: Full Code-discussed in detail with the patient's daughter who is healthcare for the patient.  Patient had previously been DNR, but about 6 months ago rescinded this and was adamant she wanted to be full code.  For now patient's daughter would like her to remain full code, until she is able to speak with other family members.  Consults called: None  Admission status: The appropriate patient status for this patient is INPATIENT. Inpatient status is judged to be reasonable and necessary in order to provide the required intensity of service to ensure the patient's safety. The  patient's presenting symptoms, physical exam findings, and initial radiographic and laboratory data in the context of their chronic comorbidities is felt to place them at high risk for further clinical deterioration. Furthermore, it is not anticipated that the patient will be medically stable for discharge from the hospital within 2 midnights of admission.    I certify that at the point of admission it is my clinical judgment that the patient will require inpatient hospital care spanning beyond 2 midnights from the point of admission due to high intensity of service, high risk for further deterioration and high frequency of surveillance required   Time spent: 48 minutes  Nakhi Choi Sharlette Dense MD [facility] Pager [phone]  If 7PM-7AM, please contact night-coverage [email or web address] Password [facility]  06/15/2022, 2:33 PM

## 2022-06-15 NOTE — Progress Notes (Signed)
Patient is being transferred here from ED. SBAR in progress notes.

## 2022-06-16 ENCOUNTER — Other Ambulatory Visit: Payer: Self-pay

## 2022-06-16 ENCOUNTER — Ambulatory Visit
Admission: RE | Admit: 2022-06-16 | Discharge: 2022-06-16 | Disposition: A | Discharge status: Home / Self Care | Payer: Medicare PPO | Source: Ambulatory Visit | Attending: Radiation Oncology | Admitting: Radiation Oncology

## 2022-06-16 DIAGNOSIS — N390 Urinary tract infection, site not specified: Secondary | ICD-10-CM

## 2022-06-16 DIAGNOSIS — C3492 Malignant neoplasm of unspecified part of left bronchus or lung: Secondary | ICD-10-CM

## 2022-06-16 LAB — RAD ONC ARIA SESSION SUMMARY
Course Elapsed Days: 0
Plan Fractions Treated to Date: 1
Plan Prescribed Dose Per Fraction: 5 Gy
Plan Total Fractions Prescribed: 10
Plan Total Prescribed Dose: 50 Gy
Reference Point Dosage Given to Date: 5 Gy
Reference Point Session Dosage Given: 5 Gy
Session Number: 1

## 2022-06-16 LAB — CBC
HCT: 33.1 % — ABNORMAL LOW (ref 36.0–46.0)
Hemoglobin: 10.1 g/dL — ABNORMAL LOW (ref 12.0–15.0)
MCH: 30.2 pg (ref 26.0–34.0)
MCHC: 30.5 g/dL (ref 30.0–36.0)
MCV: 99.1 fL (ref 80.0–100.0)
Platelets: 282 10*3/uL (ref 150–400)
RBC: 3.34 MIL/uL — ABNORMAL LOW (ref 3.87–5.11)
RDW: 12 % (ref 11.5–15.5)
WBC: 8.4 10*3/uL (ref 4.0–10.5)
nRBC: 0 % (ref 0.0–0.2)

## 2022-06-16 LAB — BASIC METABOLIC PANEL
Anion gap: 12 (ref 5–15)
BUN: 20 mg/dL (ref 8–23)
CO2: 30 mmol/L (ref 22–32)
Calcium: 8.9 mg/dL (ref 8.9–10.3)
Chloride: 91 mmol/L — ABNORMAL LOW (ref 98–111)
Creatinine, Ser: 0.84 mg/dL (ref 0.44–1.00)
GFR, Estimated: >60 mL/min (ref >60)
Glucose, Bld: 99 mg/dL (ref 70–99)
Potassium: 4.5 mmol/L (ref 3.5–5.1)
Sodium: 133 mmol/L — ABNORMAL LOW (ref 135–145)

## 2022-06-16 LAB — URINE CULTURE: Culture: >=100000 — AB

## 2022-06-16 LAB — CULTURE, BLOOD (ROUTINE X 2): Culture: NO GROWTH

## 2022-06-16 NOTE — Care Management Obs Status (Signed)
MEDICARE OBSERVATION STATUS NOTIFICATION   Patient Details  Name: Desiree [name] MRN: [id number] Date of Birth: 1945/04/25   Medicare Observation Status Notification Given:  Yes (emailed to daughter) Information for code 13 reviewed with daughter via phone due to patient is confused and forgetful.    Beckie Busing, RN 06/16/2022, 3:29 PM

## 2022-06-16 NOTE — Care Management Obs Status (Signed)
MEDICARE OBSERVATION STATUS NOTIFICATION   Patient Details  Name: Desiree [name] MRN: [id number] Date of Birth: 12/11/1945   Medicare Observation Status Notification Given:  Yes (emailed to daughter)    Ridge Lafond L Alyanna Stoermer, RN 06/16/2022, 3:27 PM 

## 2022-06-16 NOTE — Care Management CC44 (Signed)
Condition Code 44 Documentation Completed  Patient Details  Name: Desiree [name] MRN: [id number] Date of Birth: 07-02-1945   Condition Code 44 given:  Yes Patient signature on Condition Code 44 notice:  Yes Documentation of 2 MD's agreement:  Yes Code 44 added to claim:  Yes    Beckie Busing, RN 06/16/2022, 3:28 PM

## 2022-06-16 NOTE — Progress Notes (Signed)
PROGRESS NOTE    Desiree [name]  ZOX:[id number] DOB: 1945/03/14 DOA: 06/15/2022 PCP: Patient, No Pcp Per    Brief Narrative:  77 year old with history of colon cancer and lung cancer, rheumatoid arthritis on Plaquenil and prednisone, essential hypertension, COPD and chronic hypoxemia on 2 L oxygen, recent new lung mass and plan for radiation is starting today brought from the assisted living facility with weakness, lethargy, more confused than usual.  Patient is confused.  Poor historian.  Reportedly she was having more nausea. Patient tells me this sent her to emergency room because "she drinks much wine" In the emergency department hemodynamically stable.  Serologically stable.  Lactic acid 1.  Urinalysis consistent with UTI.  Given IV fluids, IV antibiotics and admitted to hospital.   Assessment & Plan:   Acute UTI present on admission: Continue Rocephin.  Blood cultures and urine culture pending.  Voiding adequately.  No evidence of postvoid residual.  Acute metabolic encephalopathy in a patient with dementia: No focal deficits.  Fall precautions.  Delirium precautions.  Currently fairly stable.  Work with PT OT.  Refer back to ALF when is stable.  Lung cancer: Recurrent.  She is scheduled for radiation therapy today that she will receive while in the hospital.  Alcohol use: Patient stated drinking alcohol only once a week.  Poor historian.  Will monitor for any withdrawal symptoms.  Rheumatoid arthritis: On Plaquenil and prednisone that will be continued. Essential hypertension, on Coreg amlodipine.  Continue.  Losartan on hold. Hyperlipidemia, on atorvastatin.  Continue. GERD, on PPI.  Continued.   DVT prophylaxis: enoxaparin (LOVENOX) injection 40 mg Start: 06/15/22 2200 SCDs Start: 06/15/22 1429   Code Status: Full code Family Communication: None at the bedside Disposition Plan: Status is: Inpatient Remains inpatient appropriate because: IV antibiotics      Consultants:  None  Procedures:  None  Antimicrobials:  Rocephin 5/12----   Subjective: Patient seen in the morning rounds.  No overnight events.  Poor historian.  Patient tells me that they sent her to the ER because she drinks.  She orders alcohol online as per her.  Patient cannot tell me otherwise why she is here.  She thinks she can walk.  Remains afebrile.  Denies any urinary symptoms.  Objective: Vitals:   06/15/22 1627 06/15/22 2051 06/16/22 0014 06/16/22 0438  BP: (!) 155/80 137/75 (!) 148/72 (!) 174/87  Pulse: 95 88 94 (!) 102  Resp:  18    Temp: 98 F (36.7 C) 98.6 F (37 C) (!) 97.5 F (36.4 C) 97.9 F (36.6 C)  TempSrc: Oral Oral Oral Oral  SpO2: 99% 97% 99% 100%  Weight:      Height:        Intake/Output Summary (Last 24 hours) at 06/16/2022 1102 Last data filed at 06/16/2022 0457 Gross per 24 hour  Intake 519.29 ml  Output 800 ml  Net -280.71 ml   Filed Weights   06/15/22 1038  Weight: 47.3 kg    Examination:  General exam: Appears calm and comfortable.  Frail.  Not in distress.  Pale looking. Respiratory system: No added sounds.  On 2 L oxygen. Cardiovascular system: S1 & S2 heard, RRR. No pedal edema. Gastrointestinal system: Abdomen is nondistended, soft and nontender. No organomegaly or masses felt. Normal bowel sounds heard. Central nervous system: Alert and awake.  Pleasant to conversation.  She is oriented to herself but not oriented to place, person and situation.   Data Reviewed: I have personally reviewed following labs  and imaging studies  CBC: Recent Labs  Lab 06/15/22 1047 06/16/22 0459  WBC 12.6* 8.4  NEUTROABS 10.7*  --   HGB 10.2* 10.1*  HCT 33.5* 33.1*  MCV 101.2* 99.1  PLT 227 282   Basic Metabolic Panel: Recent Labs  Lab 06/15/22 1047 06/16/22 0459  NA 133* 133*  K 4.5 4.5  CL 92* 91*  CO2 31 30  GLUCOSE 85 99  BUN 20 20  CREATININE 0.62 0.84  CALCIUM 9.1 8.9   GFR: Estimated Creatinine Clearance:  39.6 mL/min (by C-G formula based on SCr of 0.84 mg/dL). Liver Function Tests: Recent Labs  Lab 06/15/22 1047  AST 20  ALT 12  ALKPHOS 89  BILITOT 0.6  PROT 6.8  ALBUMIN 3.3*   No results for input(s): "LIPASE", "AMYLASE" in the last 168 hours. No results for input(s): "AMMONIA" in the last 168 hours. Coagulation Profile: No results for input(s): "INR", "PROTIME" in the last 168 hours. Cardiac Enzymes: No results for input(s): "CKTOTAL", "CKMB", "CKMBINDEX", "TROPONINI" in the last 168 hours. BNP (last 3 results) No results for input(s): "PROBNP" in the last 8760 hours. HbA1C: No results for input(s): "HGBA1C" in the last 72 hours. CBG: No results for input(s): "GLUCAP" in the last 168 hours. Lipid Profile: No results for input(s): "CHOL", "HDL", "LDLCALC", "TRIG", "CHOLHDL", "LDLDIRECT" in the last 72 hours. Thyroid Function Tests: No results for input(s): "TSH", "T4TOTAL", "FREET4", "T3FREE", "THYROIDAB" in the last 72 hours. Anemia Panel: No results for input(s): "VITAMINB12", "FOLATE", "FERRITIN", "TIBC", "IRON", "RETICCTPCT" in the last 72 hours. Sepsis Labs: Recent Labs  Lab 06/15/22 1130 06/15/22 1649  LATICACIDVEN 1.0 1.2    Recent Results (from the past 240 hour(s))  Culture, blood (routine x 2)     Status: None (Preliminary result)   Collection Time: 06/15/22 11:15 AM   Specimen: BLOOD LEFT ARM  Result Value Ref Range Status   Specimen Description   Final    BLOOD LEFT ARM Performed at [facility], [address] [place], [place] [address]    Special Requests   Final    BOTTLES DRAWN AEROBIC AND ANAEROBIC Blood Culture results may not be optimal due to an inadequate volume of blood received in culture bottles Performed at [facility], [address], [place], [place] [address]    Culture   Final    NO GROWTH < 24 HOURS Performed at [facility], [address] [place], [place] [address]    Report Status PENDING   Incomplete  Culture, blood (routine x 2)     Status: None (Preliminary result)   Collection Time: 06/15/22 11:30 AM   Specimen: BLOOD RIGHT ARM  Result Value Ref Range Status   Specimen Description   Final    BLOOD RIGHT ARM Performed at [facility], [address], [place], [place] [address]    Special Requests   Final    BOTTLES DRAWN AEROBIC AND ANAEROBIC Blood Culture results may not be optimal due to an inadequate volume of blood received in culture bottles Performed at [facility], [address], [place], [place] [address]    Culture   Final    NO GROWTH < 24 HOURS Performed at [facility], [address] [place], [place] [address]    Report Status PENDING  Incomplete  SARS Coronavirus 2 by RT PCR (hospital order, performed in [facility] hospital lab) *cepheid single result test* Anterior Nasal Swab  Status: None   Collection Time: 06/15/22 11:43 AM   Specimen: Anterior Nasal Swab  Result Value Ref Range Status   SARS Coronavirus 2 by RT PCR NEGATIVE NEGATIVE Final    Comment: (NOTE) SARS-CoV-2 target nucleic acids are NOT DETECTED.  The SARS-CoV-2 RNA is generally detectable in upper and lower respiratory specimens during the acute phase of infection. The lowest concentration of SARS-CoV-2 viral copies this assay can detect is 250 copies / mL. A negative result does not preclude SARS-CoV-2 infection and should not be used as the sole basis for treatment or other patient management decisions.  A negative result may occur with improper specimen collection / handling, submission of specimen other than nasopharyngeal swab, presence of viral mutation(s) within the areas targeted by this assay, and inadequate number of viral copies (<250 copies / mL). A negative result must be combined with clinical observations, patient history, and epidemiological information.  Fact Sheet for Patients:    RoadLapTop.co.za  Fact Sheet for Healthcare Providers: http://kim-miller.com/  This test is not yet approved or  cleared by the [place] FDA and has been authorized for detection and/or diagnosis of SARS-CoV-2 by FDA under an Emergency Use Authorization (EUA).  This EUA will remain in effect (meaning this test can be used) for the duration of the COVID-19 declaration under Section 564(b)(1) of the Act, 21 U.S.C. section 360bbb-3(b)(1), unless the authorization is terminated or revoked sooner.  Performed at [facility], [address], [place], [place] [address]          Radiology Studies: [facility] Chest Port 1 View  Result Date: 06/15/2022 CLINICAL DATA:  Weakness. EXAM: PORTABLE CHEST 1 VIEW COMPARISON:  06/04/2022 FINDINGS: The lungs are clear without focal pneumonia, edema, pneumothorax or pleural effusion. Interstitial markings are diffusely coarsened with chronic features. Scattered areas of patchy airspace disease and architectural distortion/scarring. Cardiopericardial silhouette is at upper limits of normal for size. Bones are diffusely demineralized. Telemetry leads overlie the chest. IMPRESSION: No substantial change since 06/04/2022. Electronically Signed   By: Kennith Center M.D.   On: 06/15/2022 11:33        Scheduled Meds:  amLODipine  5 mg Oral Daily   aspirin EC  81 mg Oral Daily   atorvastatin  20 mg Oral QPM   carvedilol  3.125 mg Oral BID WC   docusate sodium  100 mg Oral BID   enoxaparin (LOVENOX) injection  40 mg Subcutaneous QHS   gabapentin  100 mg Oral BID   hydroxychloroquine  200 mg Oral Daily   mirtazapine  7.5 mg Oral QHS   montelukast  10 mg Oral QHS   nystatin  5 mL Oral QID   pantoprazole  40 mg Oral Daily   predniSONE  15 mg Oral Q breakfast   sertraline  150 mg Oral Daily   Continuous Infusions:  0.9 % NaCl with KCl 20 mEq / L 50 mL/hr at 06/15/22 1830   cefTRIAXone  (ROCEPHIN)  IV       LOS: 1 day    Time spent: 35 minutes    Dorcas Carrow, MD [facility] Pager [phone]

## 2022-06-16 NOTE — Care Management Obs Status (Deleted)
MEDICARE OBSERVATION STATUS NOTIFICATION   Patient Details  Name: Desiree [name] MRN: [id number] Date of Birth: 18-Aug-1945   Medicare Observation Status Notification Given:  Yes (emailed to daughter)    Beckie Busing, RN 06/16/2022, 3:27 PM

## 2022-06-16 NOTE — Evaluation (Signed)
Physical Therapy Evaluation Patient Details Name: Desiree [name] MRN: [id number] DOB: 03/02/1945 Today's Date: 06/16/2022  History of Present Illness  Pt is 77 yo female admitted on 06/15/22 with UTI, encephalopathy with dementia, and EtOH use (monitoring for withdrawal symptoms).  Pt with hx including but not limited to lung CA, RA, HTN, COPD on 2 L O2, dementia.  Clinical Impression  Pt admitted with above diagnosis. Pt from [facility] at baseline and reports she was ambulatory with rollator to dining [place].  Pt with some confusion and questionable historian.  Today, pt required mod A for transfers and only ambulating 6'x2 with mod A for balance due to weak, ataxic gait with narrow BOS.  Pt is below reported baseline and below level for ALF from PT perspective at this time.  Will likely need further therapy at d/c prior to return to ALF unless able to improve or ALF able to provide more assist. Pt currently with functional limitations due to the deficits listed below (see PT Problem List). Pt will benefit from acute skilled PT to increase their independence and safety with mobility to allow discharge.          Recommendations for follow up therapy are one component of a multi-disciplinary discharge planning process, led by the attending physician.  Recommendations may be updated based on patient status, additional functional criteria and insurance authorization.  Follow Up Recommendations Can patient physically be transported by private vehicle: No     Assistance Recommended at Discharge Frequent or constant Supervision/Assistance  Patient can return home with the following  A lot of help with walking and/or transfers;A lot of help with bathing/dressing/bathroom;Assistance with cooking/housework;Help with stairs or ramp for entrance    Equipment Recommendations Rolling walker (2 wheels)  Recommendations for Other Services       Functional Status Assessment Patient has had a recent  decline in their functional status and demonstrates the ability to make significant improvements in function in a reasonable and predictable amount of time.     Precautions / Restrictions Precautions Precautions: Fall      Mobility  Bed Mobility Overal bed mobility: Needs Assistance Bed Mobility: Supine to Sit     Supine to sit: Mod assist     General bed mobility comments: increased time with assist for trunk    Transfers Overall transfer level: Needs assistance Equipment used: Rolling walker (2 wheels) Transfers: Sit to/from Stand, Bed to chair/wheelchair/BSC Sit to Stand: Mod assist   Step pivot transfers: Mod assist       General transfer comment: Cues for safety with mod A to rise.  Pt with posterior lean and pushing back into bed with legs.  Cued to stand straight ad tuck buttock but pt had difficulty correcting.  Some improvement with forward momentum initiation for transfer.  REquired assist with RW for transfer    Ambulation/Gait Ambulation/Gait assistance: Mod assist Gait Distance (Feet): 6 Feet (6'x2) Assistive device: Rolling walker (2 wheels) Gait Pattern/deviations: Step-to pattern, Decreased stride length, Ataxic, Narrow base of support Gait velocity: decreased     General Gait Details: Pt with very narrow BOS -could correct but frequent cues.  Pt very unsteady and with ataxic pattern requiring mod A for balance and RW management  Stairs            Wheelchair Mobility    Modified Rankin (Stroke Patients Only)       Balance Overall balance assessment: Needs assistance Sitting-balance support: Bilateral upper extremity supported Sitting balance-Leahy Scale: Poor  Sitting balance - Comments: needed use of UE   Standing balance support: Bilateral upper extremity supported Standing balance-Leahy Scale: Poor Standing balance comment: RW and mod A with posterior lean                             Pertinent Vitals/Pain Pain  Assessment Pain Assessment: No/denies pain    Home Living Family/patient expects to be discharged to:: Assisted living Living Arrangements:  [facility] [place])   Type of Home: Assisted living         Home Layout: One level Home Equipment: Rollator (4 wheels) Additional Comments: On 2 L O2 at home    Prior Function Prior Level of Function : Needs assist             Mobility Comments: Reports can walk to dining [place] at ALF with rollator; Of note provided by pt and she is questionable historian (see cognition) ADLs Comments: Pt reports generally independent with adls; Facility does IADLs     Hand Dominance        Extremity/Trunk Assessment   Upper Extremity Assessment Upper Extremity Assessment: Generalized weakness    Lower Extremity Assessment Lower Extremity Assessment: LLE deficits/detail;RLE deficits/detail RLE Deficits / Details: ROM WFL; MMT 4/5 LLE Deficits / Details: ROM WFL; MMT 4/5    Cervical / Trunk Assessment Cervical / Trunk Assessment: Kyphotic  Communication   Communication: No difficulties  Cognition Arousal/Alertness: Awake/alert Behavior During Therapy: WFL for tasks assessed/performed Overall Cognitive Status: No family/caregiver present to determine baseline cognitive functioning Area of Impairment: Memory, Orientation, Awareness                 Orientation Level: Disoriented to, Place   Memory: Decreased short-term memory     Awareness: Emergent   General Comments: Pt with hx of dementia and admitted with encephalopathy.  Initially , she stated month and year, name and birthday, that she had UTI, and seemed to provided history consistent with prior reports. Pt able to state from ALF , [facility] (she could not state this earlier per RN).  However, with mobility needing increased cues for safety and pt started asking therapist to make a call to [facility] to make sure she still has her bed.  REoriented pt that she is currently in  hospital and has her bed/room.        General Comments General comments (skin integrity, edema, etc.): On 3 L O2 with VSS    Exercises     Assessment/Plan    PT Assessment Patient needs continued PT services  PT Problem List Decreased strength;Decreased coordination;Cardiopulmonary status limiting activity;Decreased cognition;Decreased activity tolerance;Decreased knowledge of use of DME;Decreased safety awareness;Decreased balance;Decreased mobility;Decreased knowledge of precautions       PT Treatment Interventions DME instruction;Therapeutic exercise;Gait training;Balance training;Stair training;Neuromuscular re-education;Functional mobility training;Therapeutic activities;Patient/family education;Cognitive remediation;Modalities    PT Goals (Current goals can be found in the Care Plan section)  Acute Rehab PT Goals Patient Stated Goal: return home PT Goal Formulation: With patient Time For Goal Achievement: 06/30/22 Potential to Achieve Goals: Fair    Frequency Min 1X/week     Co-evaluation               AM-PAC PT "6 Clicks" Mobility  Outcome Measure Help needed turning from your back to your side while in a flat bed without using bedrails?: A Little Help needed moving from lying on your back to sitting on the side of a flat bed  without using bedrails?: A Lot Help needed moving to and from a bed to a chair (including a wheelchair)?: A Lot Help needed standing up from a chair using your arms (e.g., wheelchair or bedside chair)?: A Lot Help needed to walk in hospital room?: A Lot Help needed climbing 3-5 steps with a railing? : Total 6 Click Score: 12    End of Session Equipment Utilized During Treatment: Gait belt;Oxygen Activity Tolerance: Patient tolerated treatment well Patient left: with chair alarm set;in chair;with call bell/phone within reach Nurse Communication: Mobility status PT Visit Diagnosis: Ataxic gait (R26.0);Muscle weakness (generalized)  (M62.81)    Time: 1914-7829 PT Time Calculation (min) (ACUTE ONLY): 19 min   Charges:   PT Evaluation $PT Eval Low Complexity: 1 Low          Bailea Beed, PT Acute Rehab [facility] Re[facility] [phone]   Rayetta Humphrey 06/16/2022, 4:40 PM

## 2022-06-16 NOTE — Progress Notes (Signed)
Mobility Specialist - Progress Note   06/16/22 1153  Mobility  Activity Transferred from bed to chair  Level of Assistance Moderate assist, patient does 50-74%  Assistive Device None  Distance Ambulated (ft) 3 ft  Range of Motion/Exercises Active  Activity Response Tolerated well  Mobility Referral Yes  $Mobility charge 1 Mobility  Mobility Specialist Start Time (ACUTE ONLY) 1140  Mobility Specialist Stop Time (ACUTE ONLY) 1150  Mobility Specialist Time Calculation (min) (ACUTE ONLY) 10 min   Pt received in bed and agreed to sit up for lunch, Min A for bed mobility, Mod A for sit to stand and small shuffled steps.   In chair with all needs met and alarm on.  Desiree [name] Mobility Specialist

## 2022-06-17 ENCOUNTER — Ambulatory Visit
Admission: RE | Admit: 2022-06-17 | Discharge: 2022-06-17 | Disposition: A | Discharge status: Home / Self Care | Payer: Medicare PPO | Source: Ambulatory Visit | Attending: Radiation Oncology | Admitting: Radiation Oncology

## 2022-06-17 ENCOUNTER — Other Ambulatory Visit: Payer: Self-pay

## 2022-06-17 DIAGNOSIS — I482 Chronic atrial fibrillation, unspecified: Secondary | ICD-10-CM | POA: Diagnosis present

## 2022-06-17 DIAGNOSIS — C3432 Malignant neoplasm of lower lobe, left bronchus or lung: Secondary | ICD-10-CM

## 2022-06-17 DIAGNOSIS — Z8249 Family history of ischemic heart disease and other diseases of the circulatory system: Secondary | ICD-10-CM | POA: Diagnosis not present

## 2022-06-17 DIAGNOSIS — N3000 Acute cystitis without hematuria: Secondary | ICD-10-CM | POA: Diagnosis not present

## 2022-06-17 DIAGNOSIS — K219 Gastro-esophageal reflux disease without esophagitis: Secondary | ICD-10-CM | POA: Diagnosis present

## 2022-06-17 DIAGNOSIS — E1122 Type 2 diabetes mellitus with diabetic chronic kidney disease: Secondary | ICD-10-CM | POA: Diagnosis present

## 2022-06-17 DIAGNOSIS — Z1152 Encounter for screening for COVID-19: Secondary | ICD-10-CM | POA: Diagnosis not present

## 2022-06-17 DIAGNOSIS — Z9981 Dependence on supplemental oxygen: Secondary | ICD-10-CM | POA: Diagnosis not present

## 2022-06-17 DIAGNOSIS — Z87891 Personal history of nicotine dependence: Secondary | ICD-10-CM | POA: Diagnosis not present

## 2022-06-17 DIAGNOSIS — I480 Paroxysmal atrial fibrillation: Secondary | ICD-10-CM | POA: Diagnosis present

## 2022-06-17 DIAGNOSIS — Z923 Personal history of irradiation: Secondary | ICD-10-CM | POA: Diagnosis not present

## 2022-06-17 DIAGNOSIS — E039 Hypothyroidism, unspecified: Secondary | ICD-10-CM | POA: Diagnosis present

## 2022-06-17 DIAGNOSIS — M069 Rheumatoid arthritis, unspecified: Secondary | ICD-10-CM | POA: Diagnosis present

## 2022-06-17 DIAGNOSIS — C3490 Malignant neoplasm of unspecified part of unspecified bronchus or lung: Secondary | ICD-10-CM | POA: Diagnosis present

## 2022-06-17 DIAGNOSIS — N183 Chronic kidney disease, stage 3 unspecified: Secondary | ICD-10-CM | POA: Diagnosis present

## 2022-06-17 DIAGNOSIS — G9341 Metabolic encephalopathy: Secondary | ICD-10-CM | POA: Diagnosis present

## 2022-06-17 DIAGNOSIS — I2510 Atherosclerotic heart disease of native coronary artery without angina pectoris: Secondary | ICD-10-CM | POA: Diagnosis present

## 2022-06-17 DIAGNOSIS — G609 Hereditary and idiopathic neuropathy, unspecified: Secondary | ICD-10-CM | POA: Diagnosis present

## 2022-06-17 DIAGNOSIS — F1021 Alcohol dependence, in remission: Secondary | ICD-10-CM | POA: Diagnosis present

## 2022-06-17 DIAGNOSIS — I129 Hypertensive chronic kidney disease with stage 1 through stage 4 chronic kidney disease, or unspecified chronic kidney disease: Secondary | ICD-10-CM | POA: Diagnosis present

## 2022-06-17 DIAGNOSIS — N390 Urinary tract infection, site not specified: Secondary | ICD-10-CM | POA: Diagnosis present

## 2022-06-17 DIAGNOSIS — B9620 Unspecified Escherichia coli [E. coli] as the cause of diseases classified elsewhere: Secondary | ICD-10-CM | POA: Diagnosis present

## 2022-06-17 DIAGNOSIS — Z79899 Other long term (current) drug therapy: Secondary | ICD-10-CM | POA: Diagnosis not present

## 2022-06-17 DIAGNOSIS — R5381 Other malaise: Secondary | ICD-10-CM | POA: Diagnosis present

## 2022-06-17 DIAGNOSIS — E782 Mixed hyperlipidemia: Secondary | ICD-10-CM | POA: Diagnosis present

## 2022-06-17 DIAGNOSIS — F0280 Dementia in other diseases classified elsewhere without behavioral disturbance: Secondary | ICD-10-CM | POA: Diagnosis present

## 2022-06-17 LAB — RAD ONC ARIA SESSION SUMMARY
Course Elapsed Days: 1
Plan Fractions Treated to Date: 1
Plan Fractions Treated to Date: 2
Plan Prescribed Dose Per Fraction: 10 Gy
Plan Prescribed Dose Per Fraction: 5 Gy
Plan Total Fractions Prescribed: 10
Plan Total Fractions Prescribed: 5
Plan Total Prescribed Dose: 50 Gy
Plan Total Prescribed Dose: 50 Gy
Reference Point Dosage Given to Date: 10 Gy
Reference Point Dosage Given to Date: 10 Gy
Reference Point Session Dosage Given: 10 Gy
Reference Point Session Dosage Given: 5 Gy
Session Number: 2

## 2022-06-17 LAB — URINE CULTURE

## 2022-06-17 LAB — CULTURE, BLOOD (ROUTINE X 2)

## 2022-06-17 NOTE — [place] FL2 (Signed)
[place] MEDICAID [id number] LEVEL OF CARE FORM     IDENTIFICATION  Patient Name: Desiree [name] Birthdate: 1945/09/22 Sex: female Admission Date (Current Location): 06/15/2022  [facility] and [place] Number:  Producer, television/film/video and Address:  [facility],  [address] [place], [place] [address]      Provider Number: [id number]  Attending Physician Name and Address:  Dorcas Carrow, MD  Relative Name and Phone Number:  Hannahgrace Montilla  [phone]    Current Level of Care: Hospital Recommended Level of Care: Skilled Nursing Facility Prior Approval Number:    Date Approved/Denied:   PASRR Number: [id number]  Discharge Plan: SNF    Current Diagnoses: Patient Active Problem List   Diagnosis Date Noted   UTI (urinary tract infection) 06/15/2022   AF (paroxysmal atrial fibrillation) (HCC) 10/10/2021   History of radiation therapy 04/09/2021   Chronic pain 04/09/2021   Hypothyroidism 04/09/2021   Type 2 diabetes mellitus without complications (HCC) 04/09/2021   Vitamin D deficiency 04/09/2021   Malignant tumor of ascending colon (HCC) 04/09/2021   Chronic obstructive pulmonary disease (HCC) 04/09/2021   Malignant neoplasm of unspecified part of left bronchus or lung (HCC) 04/09/2021   Atherosclerotic heart disease of native coronary artery without angina pectoris 04/09/2021   Chronic anemia 04/09/2021   Other vitamin B12 deficiency anemias 04/09/2021   Chronic atrial fibrillation, unspecified (HCC) 10/04/2020   Carcinoma of ascending colon (HCC) 10/04/2020   Dyspnea 10/04/2020   Lung cancer, lower lobe (HCC) 09/24/2020   Lung nodule, solitary 08/16/2020   Status post thoracentesis    Schatzki's ring    Cancer of ascending colon (HCC)    Gastrointestinal hemorrhage    Cat allergies 07/26/2020   Symptomatic anemia 07/25/2020   Idiopathic peripheral neuropathy 02/23/2020   Benign paroxysmal positional vertigo due to bilateral vestibular disorder 02/09/2020   Cysts  of both ovaries 11/24/2019   Other cirrhosis of liver (HCC) 11/24/2019   Early satiety 04/14/2019   Paroxysmal atrial fibrillation (HCC) 04/14/2019   Mild CAD 02/02/2019   Abnormal nuclear stress test    Iron deficiency anemia due to chronic blood loss 10/27/2018   Alcoholism (HCC) 10/21/2018   CKD (chronic kidney disease) stage 3, GFR 30-59 ml/min (HCC) 01/04/2018   Senile purpura (HCC) 01/04/2018   At high risk for falls 07/14/2017   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HCC) 07/14/2017   Gastro-esophageal reflux disease without esophagitis 07/14/2017   History of pulmonary embolus (PE) 07/14/2017   Malaise and fatigue 07/14/2017   Mild episode of recurrent major depressive disorder (HCC) 07/14/2017   Mixed hyperlipidemia 07/14/2017   Prediabetes 07/14/2017   Skin rash 07/14/2017   Vitamin B12 deficiency 07/14/2017   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   Multiple lung nodules on CT 05/16/2016   Osteoporosis 05/16/2016   Centrilobular emphysema (HCC) 05/16/2016   Age-related osteoporosis without current pathological fracture 05/16/2016   Allergic rhinitis 07/17/2015   Pulmonary embolism (HCC) 11/23/2014   Rheumatoid arthritis(714.0) 09/20/2010   Rheumatoid arthritis (HCC) 09/20/2010   COPD (chronic obstructive pulmonary disease) (HCC) 08/22/2010   Hypertension 08/22/2010   Essential hypertension 08/22/2010    Orientation RESPIRATION BLADDER Height & Weight     Self  Normal Continent Weight: 47.3 kg Height:  5' (152.4 cm)  BEHAVIORAL SYMPTOMS/MOOD NEUROLOGICAL BOWEL NUTRITION STATUS  Other (Comment) (forgetful)  (n/a) Incontinent Diet  AMBULATORY STATUS COMMUNICATION OF NEEDS Skin   Extensive Assist Verbally Other (Comment) (redness noted to buttocks)  Personal Care Assistance Level of Assistance  Bathing, Feeding, Dressing Bathing Assistance: Limited assistance Feeding assistance: Limited assistance Dressing Assistance:  Limited assistance     Functional Limitations Info  Sight, Hearing, Speech Sight Info: Impaired Hearing Info: Adequate Speech Info: Adequate    SPECIAL CARE FACTORS FREQUENCY  PT (By licensed PT), OT (By licensed OT)     PT Frequency: 5X/week OT Frequency: 5X/week            Contractures Contractures Info: Not present    Additional Factors Info  Code Status, Allergies, Psychotropic, Insulin Sliding Scale, Isolation Precautions, Suctioning Needs Code Status Info: Full Allergies Info: Other, Oxycodone Psychotropic Info: see discharge summary Insulin Sliding Scale Info: see discharge summary Isolation Precautions Info: n/a Suctioning Needs: n/a   Current Medications (06/17/2022):  This is the current hospital active medication list Current Facility-Administered Medications  Medication Dose Route Frequency Provider Last Rate Last Admin   acetaminophen (TYLENOL) tablet 650 mg  650 mg Oral Q6H PRN Kirby Crigler, Mir M, MD       Or   acetaminophen (TYLENOL) suppository 650 mg  650 mg Rectal Q6H PRN Kirby Crigler, Mir M, MD       albuterol (PROVENTIL) (2.5 MG/3ML) 0.083% nebulizer solution 2.5 mg  2.5 mg Nebulization Q2H PRN Kirby Crigler, Mir M, MD       amLODipine (NORVASC) tablet 5 mg  5 mg Oral Daily Kirby Crigler, Mir M, MD   5 mg at 06/17/22 1000   aspirin EC tablet 81 mg  81 mg Oral Daily Kirby Crigler, Mir M, MD   81 mg at 06/17/22 1000   atorvastatin (LIPITOR) tablet 20 mg  20 mg Oral QPM Kirby Crigler, Mir M, MD   20 mg at 06/16/22 1847   carvedilol (COREG) tablet 3.125 mg  3.125 mg Oral BID WC Kirby Crigler, Mir M, MD   3.125 mg at 06/17/22 0831   cefTRIAXone (ROCEPHIN) 1 g in sodium chloride 0.9 % 100 mL IVPB  1 g Intravenous Q24H Kirby Crigler, Mir M, MD 200 mL/hr at 06/17/22 1251 1 g at 06/17/22 1251   docusate sodium (COLACE) capsule 100 mg  100 mg Oral BID Kirby Crigler, Mir M, MD   100 mg at 06/17/22 1000   enoxaparin (LOVENOX) injection 40 mg  40 mg Subcutaneous QHS Kirby Crigler, Mir M, MD    40 mg at 06/16/22 2114   gabapentin (NEURONTIN) capsule 100 mg  100 mg Oral BID Kirby Crigler, Mir M, MD   100 mg at 06/17/22 1000   hydroxychloroquine (PLAQUENIL) tablet 200 mg  200 mg Oral Daily Kirby Crigler, Mir M, MD   200 mg at 06/17/22 1001   meclizine (ANTIVERT) tablet 12.5 mg  12.5 mg Oral BID PRN Kirby Crigler, Mir M, MD       metoprolol tartrate (LOPRESSOR) injection 5 mg  5 mg Intravenous Q6H PRN Kirby Crigler, Mir M, MD       mirtazapine (REMERON) tablet 7.5 mg  7.5 mg Oral QHS Kirby Crigler, Mir M, MD   7.5 mg at 06/16/22 2115   montelukast (SINGULAIR) tablet 10 mg  10 mg Oral QHS Kirby Crigler, Mir M, MD   10 mg at 06/16/22 2115   nystatin (MYCOSTATIN) 100000 UNIT/ML suspension 500,000 Units  5 mL Oral QID Kirby Crigler, Mir M, MD   500,000 Units at 06/17/22 1309   ondansetron (ZOFRAN) tablet 4 mg  4 mg Oral Q6H PRN Kirby Crigler, Mir M, MD       Or   ondansetron (ZOFRAN) injection 4 mg  4 mg Intravenous Q6H PRN Kirby Crigler, Mir  M, MD       pantoprazole (PROTONIX) EC tablet 40 mg  40 mg Oral Daily Kirby Crigler, Mir M, MD   40 mg at 06/17/22 1000   polyethylene glycol (MIRALAX / GLYCOLAX) packet 17 g  17 g Oral Daily PRN Kirby Crigler, Mir M, MD       predniSONE (DELTASONE) tablet 15 mg  15 mg Oral Q breakfast Kirby Crigler, Mir M, MD   15 mg at 06/17/22 0831   sertraline (ZOLOFT) tablet 150 mg  150 mg Oral Daily Kirby Crigler, Mir M, MD   150 mg at 06/17/22 1000   traMADol (ULTRAM) tablet 50 mg  50 mg Oral TID PRN Maryln Gottron, MD   50 mg at 06/17/22 0834   traZODone (DESYREL) tablet 25 mg  25 mg Oral QHS PRN Maryln Gottron, MD   25 mg at 06/16/22 2115     Discharge Medications: Please see discharge summary for a list of discharge medications.  Relevant Imaging Results:  Relevant Lab Results:   Additional Information SS# [id number]  Beckie Busing, RN

## 2022-06-17 NOTE — Evaluation (Signed)
Occupational Therapy Evaluation Patient Details Name: Breelan Curbow MRN: [id number] DOB: 1945/05/04 Today's Date: 06/17/2022   History of Present Illness Ms. Weist is a 77 yr old female brought to the hospital with weakness, lethargy, and confusion. She was found to have UTI & acute metabolic encephalopathy. 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 Of note, pt was recently found to have a new lung mass with plans to start radiation   Clinical Impression   The pt reported being modified independent to independent with ADLs at her baseline; she is currently presenting well below her baseline level of functioning for self-care management, as she requires increased assist for tasks such as toileting, sit to stand, and lower body dressing. During the session, she reported feeling like she was "spinning" and feeling as though she was going to fall, while she attempted progressive activity. She reported using 3L O2 around the clock at her baseline. She was also noted to be with shortness of breath and compromised endurance, and was therefore instructed on pursed lip breathing exercises, and she subsequently required intermittent therapeutic rest breaks. Without further OT services, she is at risk for further weakness and deconditioning, as well as restricted ADL participation.       Recommendations for follow up therapy are one component of a multi-disciplinary discharge planning process, led by the attending physician.  Recommendations may be updated based on patient status, additional functional criteria and insurance authorization.   Assistance Recommended at Discharge Frequent or constant Supervision/Assistance  Patient can return home with the following Direct supervision/assist for medications management;Assist for transportation;A lot of help with walking and/or transfers;A lot of help with bathing/dressing/bathroom    Functional Status Assessment   Patient has had a recent decline in their functional status and demonstrates the ability to make significant improvements in function in a reasonable and predictable amount of time.  Equipment Recommendations  Other (comment) (to be determined pending progress at next setting)       Precautions / Restrictions Precautions Precautions: Fall Restrictions Weight Bearing Restrictions: No Other Position/Activity Restrictions: 3L O2 use aroud the clock at her baseline, per pt report     Mobility Bed Mobility Overal bed mobility: Needs Assistance Bed Mobility: Supine to Sit, Sit to Supine     Supine to sit: Mod assist, HOB elevated Sit to supine: Min assist   General bed mobility comments:  (increased time with assist for trunk, as well as advancing BLE and cues to use bedrail to assist with transfer)    Transfers Overall transfer level: Needs assistance Equipment used: Rolling walker (2 wheels) Transfers: Sit to/from Stand Sit to Stand: Mod assist, From elevated surface           General transfer comment: The pt instructed on lateral stepping along the EOB using a RW, for which she required significant balance assist & increased cues for proper body positioning and advancing walker and BLE      Balance Overall balance assessment: Needs assistance     Sitting balance - Comments: static sitting-good-  dynamic sitting-fair+     Standing balance-Leahy Scale: Poor             ADL either performed or assessed with clinical judgement   ADL Overall ADL's : Needs assistance/impaired Eating/Feeding: Set up;Sitting     Grooming Details (indicate cue type and reason): based on clinical judgement         Upper Body Dressing : Minimal  assistance Upper Body Dressing Details (indicate cue type and reason): simulated seated EOB Lower Body Dressing: Moderate assistance;Sit to/from stand   Toilet Transfer: Moderate assistance;Stand-pivot;BSC/3in1;Rolling walker (2  wheels) Toilet Transfer Details (indicate cue type and reason): based on clinical judgement Toileting- Clothing Manipulation and Hygiene: Maximal assistance;Sit to/from stand Toileting - Clothing Manipulation Details (indicate cue type and reason): At bedside commode level, based on clinical judgement             Vision   Additional Comments: She correctly read the time depicted on the wall clock, after incorrectly stating the time on the first attempt.               Hand Dominance Right   Extremity/Trunk Assessment Upper Extremity Assessment Upper Extremity Assessment: RUE deficits/detail;LUE deficits/detail RUE Deficits / Details: Chronic shoulder AROM limitations, with AROM for shoulder flexion being <90 degrees. Elbow and hand AROM WFL. Gross strength 4-/5 LUE Deficits / Details: AROM WFL. Gross strength 4-/5   Lower Extremity Assessment RLE Deficits / Details: AROM WFL. LLE Deficits / Details: AROM WFL.       Communication Communication Communication: No difficulties   Cognition Arousal/Alertness: Awake/alert Behavior During Therapy: WFL for tasks assessed/performed      Orientation Level: Disoriented to, Situation             General Comments: Oriented to person, place, month, and year; disoriented to situation. Able to follow 1-2 step commands consistently -                Home Living Family/patient expects to be discharged to:: Assisted living     Type of Home: Assisted living       Home Layout: One level               Home Equipment: Rollator (4 wheels)   Additional Comments: 3L O2 use      Prior Functioning/Environment               Mobility Comments:  (Pt reported being able to ambulate inside the ALF without assistance and using a RW, though she stated she occasionally needs supervision when doing so.) ADLs Comments:  (She reported being modified independent to independent with ADLs.)        OT Problem List:  Decreased strength;Decreased range of motion;Decreased activity tolerance;Impaired balance (sitting and/or standing);Decreased coordination;Decreased knowledge of use of DME or AE;Cardiopulmonary status limiting activity      OT Treatment/Interventions: Self-care/ADL training;Therapeutic exercise;Patient/family education;Neuromuscular education;Balance training;Energy conservation;Therapeutic activities;DME and/or AE instruction    OT Goals(Current goals can be found in the care plan section) Acute Rehab OT Goals Patient Stated Goal: to return to her ALF OT Goal Formulation: With patient Time For Goal Achievement: 07/01/22 Potential to Achieve Goals: Good ADL Goals Pt Will Perform Grooming: with set-up;sitting Pt Will Perform Upper Body Dressing: with set-up;sitting Pt Will Perform Lower Body Dressing: with supervision;with set-up;sit to/from stand Pt Will Transfer to Toilet: with supervision;bedside commode;ambulating Pt Will Perform Toileting - Clothing Manipulation and hygiene: with supervision;sit to/from stand  OT Frequency: Min 1X/week       AM-PAC OT "6 Clicks" Daily Activity     Outcome Measure Help from another person eating meals?: None Help from another person taking care of personal grooming?: A Little Help from another person toileting, which includes using toliet, bedpan, or urinal?: A Lot Help from another person bathing (including washing, rinsing, drying)?: A Lot Help from another person to put on and taking off regular upper body  clothing?: A Little Help from another person to put on and taking off regular lower body clothing?: A Lot 6 Click Score: 16   End of Session Equipment Utilized During Treatment: Gait belt;Rolling walker (2 wheels) Nurse Communication: Mobility status  Activity Tolerance: Other (comment) (Fair overall tolerance. Limited by feelings of "spinning" and general fatigue) Patient left: in bed;with call bell/phone within reach;with bed alarm  set  OT Visit Diagnosis: Unsteadiness on feet (R26.81);Muscle weakness (generalized) (M62.81)                Time: 1610-9604 OT Time Calculation (min): 24 min Charges:  OT General Charges $OT Visit: 1 Visit OT Evaluation $OT Eval Moderate Complexity: 1 Mod OT Treatments $Therapeutic Activity: 8-22 mins    Reuben Likes, OTR/L 06/17/2022, 11:24 AM

## 2022-06-17 NOTE — Progress Notes (Signed)
Chaplain offered a prayer over [place]. Raylah expressed her gratitude.     06/17/22 1400  Spiritual Encounters  Type of Visit Initial  Care provided to: Patient  Referral source Patient request  Reason for visit Routine spiritual support  Interventions  Spiritual Care Interventions Made Prayer

## 2022-06-17 NOTE — TOC Initial Note (Addendum)
Transition of Care [facility]) - Initial/Assessment Note    Patient Details  Name: Desiree [name] MRN: [id number] Date of Birth: May 06, 1945  [facility] of [facility]) CM/SW Contact:    Beckie Busing, RN Phone Number:[phone]  06/17/2022, 1:10 PM  Clinical Narrative:                 TOC consulted for patient from [facility]. Patient has new recommendation for SNF for short term rehab. CM spoke with daughter Burman Blacksmith to make her aware of the SNF recommendation. Daughter gives CM consent to start SNF workup. Daughter states that she would like for her mother to go to [facility] [place]. CM has explained that info will be sent to [facility] but there is no guarantee for bed offer. Daughter does not have a second choice. CM made daughter aware that TOC will follow up once patient received bed offers.   1354 CM received message from Primary nurse stating that daughter has called and only wants patient to go to [facility] in [place]. CM called daughter to confirm what the plan is. Daughter states that she only wants [facility] in [place] and if they don't have a bed available.she does not want her to go to a SNF. Daughter states that if no beds at [facility] she would need to talk with her brother. Daughter states that she is going to call [facility] to see if they are willing to take her mother back with SNF recommendations. CM has clarified that daughter does want CM to fax out to [facility] [place] for bed offer.   1430 Patient info has been faxed to [facility] for bed offer.     Barriers to Discharge: SNF Pending bed offer   Patient Goals and CMS Choice Patient states their goals for this hospitalization and ongoing recovery are:: Patient is confused unable to answer CMS Medicare.gov Compare Post Acute Care list provided to:: Patient Represenative (must comment) (daughter) Choice offered to / list presented to : Adult Children (daughter Control and instrumentation engineer via phone) [place] ownership interest in [facility].provided to:: Adult Children    Expected Discharge Plan and Services In-house Referral: NA Discharge Planning Services: CM Consult Post Acute Care Choice: Nursing Home, Skilled Nursing Facility Living arrangements for the past 2 months: Assisted Living Facility                 DME Arranged: N/A DME Agency: NA       HH Arranged: NA HH Agency: NA        Prior Living Arrangements/Services Living arrangements for the past 2 months: Assisted Living Facility Lives with:: Other (Comment) (ALF) Patient language and need for interpreter reviewed:: Yes Do you feel safe going back to the place where you live?:  (patient unable to answer confused)      Need for Family Participation in Patient Care: Yes (Comment) Care giver support system in place?: Yes (comment)   Criminal Activity/Legal Involvement Pertinent to Current Situation/Hospitalization: No - Comment as needed  Activities of Daily Living Home Assistive Devices/Equipment: Dan Humphreys (specify type) (uses one at nursing home.) ADL Screening (condition at time of admission) Patient's cognitive ability adequate to safely complete daily activities?: No Is the patient deaf or have difficulty hearing?: Yes Does the patient have difficulty seeing, even when wearing glasses/contacts?: No (wears glasses, they are not here though.) Does the patient have difficulty concentrating, remembering, or making decisions?: Yes Patient able to express need for assistance with ADLs?: Yes Does the patient have difficulty  dressing or bathing?: Yes Independently performs ADLs?: No Communication: Independent Dressing (OT): Needs assistance Grooming: Needs assistance Feeding: Independent Bathing: Needs assistance Toileting: Needs assistance (patient is very shaky.) In/Out Bed: Dependent Does the patient have difficulty walking or climbing stairs?: Yes Weakness of Legs: Both Weakness of Arms/Hands: Both (patient could not hold on to side bar to  turn.)  Permission Sought/Granted Permission sought to share information with :  (unable to obtain due to confusion)                Emotional Assessment Appearance:: Appears stated age Attitude/Demeanor/Rapport: Unable to Assess Affect (typically observed): Quiet Orientation: : Oriented to Self, Fluctuating Orientation (Suspected and/or reported Sundowners) Alcohol / Substance Use: Not Applicable Psych Involvement: No (comment)  Admission diagnosis:  UTI (urinary tract infection) [N39.0] Weakness [R53.1] Urinary tract infection without hematuria, site unspecified [N39.0] Patient Active Problem List   Diagnosis Date Noted   UTI (urinary tract infection) 06/15/2022   AF (paroxysmal atrial fibrillation) (HCC) 10/10/2021   History of radiation therapy 04/09/2021   Chronic pain 04/09/2021   Hypothyroidism 04/09/2021   Type 2 diabetes mellitus without complications (HCC) 04/09/2021   Vitamin D deficiency 04/09/2021   Malignant tumor of ascending colon (HCC) 04/09/2021   Chronic obstructive pulmonary disease (HCC) 04/09/2021   Malignant neoplasm of unspecified part of left bronchus or lung (HCC) 04/09/2021   Atherosclerotic heart disease of native coronary artery without angina pectoris 04/09/2021   Chronic anemia 04/09/2021   Other vitamin B12 deficiency anemias 04/09/2021   Chronic atrial fibrillation, unspecified (HCC) 10/04/2020   Carcinoma of ascending colon (HCC) 10/04/2020   Dyspnea 10/04/2020   Lung cancer, lower lobe (HCC) 09/24/2020   Lung nodule, solitary 08/16/2020   Status post thoracentesis    Schatzki's ring    Cancer of ascending colon (HCC)    Gastrointestinal hemorrhage    Cat allergies 07/26/2020   Symptomatic anemia 07/25/2020   Idiopathic peripheral neuropathy 02/23/2020   Benign paroxysmal positional vertigo due to bilateral vestibular disorder 02/09/2020   Cysts of both ovaries 11/24/2019   Other cirrhosis of liver (HCC) 11/24/2019   Early satiety  04/14/2019   Paroxysmal atrial fibrillation (HCC) 04/14/2019   Mild CAD 02/02/2019   Abnormal nuclear stress test    Iron deficiency anemia due to chronic blood loss 10/27/2018   Alcoholism (HCC) 10/21/2018   CKD (chronic kidney disease) stage 3, GFR 30-59 ml/min (HCC) 01/04/2018   Senile purpura (HCC) 01/04/2018   At high risk for falls 07/14/2017   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HCC) 07/14/2017   Gastro-esophageal reflux disease without esophagitis 07/14/2017   History of pulmonary embolus (PE) 07/14/2017   Malaise and fatigue 07/14/2017   Mild episode of recurrent major depressive disorder (HCC) 07/14/2017   Mixed hyperlipidemia 07/14/2017   Prediabetes 07/14/2017   Skin rash 07/14/2017   Vitamin B12 deficiency 07/14/2017   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   Multiple lung nodules on CT 05/16/2016   Osteoporosis 05/16/2016   Centrilobular emphysema (HCC) 05/16/2016   Age-related osteoporosis without current pathological fracture 05/16/2016   Allergic rhinitis 07/17/2015   Pulmonary embolism (HCC) 11/23/2014   Rheumatoid arthritis(714.0) 09/20/2010   Rheumatoid arthritis (HCC) 09/20/2010   COPD (chronic obstructive pulmonary disease) (HCC) 08/22/2010   Hypertension 08/22/2010   Essential hypertension 08/22/2010   PCP:  Patient, No Pcp Per Pharmacy:   [facility] [place], [place] - [address] [facility] [place]. [address] [place] [address] Phone: [phone] Fax:  [phone]     Social Determinants of Health (SDOH) Social History: SDOH Screenings   Food Insecurity: No Food Insecurity (06/16/2022)  Housing: Low Risk  (06/16/2022)  Transportation Needs: No Transportation Needs (06/16/2022)  Utilities: Not At Risk (06/16/2022)  Financial Resource Strain: Low Risk  (10/26/2020)  Social Connections: Socially Isolated (10/26/2020)  Tobacco Use: Medium Risk (06/15/2022)   SDOH Interventions:     Readmission Risk Interventions     06/17/2022   12:54 PM  Readmission Risk Prevention Plan  Transportation Screening Complete  PCP or Specialist Appt within 5-7 Days Complete  Home Care Screening Complete

## 2022-06-17 NOTE — Progress Notes (Signed)
PROGRESS NOTE    Desiree [name]  ZOX:[id number] DOB: 1945-08-11 DOA: 06/15/2022 PCP: Patient, No Pcp Per    Brief Narrative:  77 year old with history of colon cancer and lung cancer, rheumatoid arthritis on Plaquenil and prednisone, essential hypertension, COPD and chronic hypoxemia on 2 L oxygen, recent new lung mass and plan for radiation starting this week brought from the assisted living facility with weakness, lethargy, more confused than usual.  Patient is confused.  Poor historian.  Reportedly she was having more nausea and she was not tolerating diet. In the emergency department hemodynamically stable.  Serologically stable.  Lactic acid 1.  Urinalysis consistent with UTI.  Given IV fluids, IV antibiotics and admitted to hospital.   Assessment & Plan:   Acute UTI present on admission: Acute metabolic encephalopathy in a patient with dementia secondary to acute UTI. Some clinical improvement today.  Blood cultures negative.  Urine culture with E. coli. Continue Rocephin today.  We can likely change her to oral antibiotics by tomorrow if she is able to be discharged. Voiding adequately.  No evidence of postvoid residual.  Acute metabolic encephalopathy in a patient with dementia: Severely debilitated. No focal deficits.  Fall precautions.  Delirium precautions.  Currently fairly stable.  Work with PT OT.   Unable to go back to assisted living facility. Refer to a SNF.  Lung cancer: Recurrent.  She is scheduled for radiation therapy and she is getting it in the hospital.   Alcohol use: Previous history of alcoholism. Patient on 4 ounces of red wine with dinner as therapeutic alcohol at assisted living facility.  Unable to prescribe wine in the hospital.  Does not show any evidence of withdrawal.  Will monitor.  Rheumatoid arthritis: On Plaquenil and prednisone that will be continued. Essential hypertension, on Coreg amlodipine.  Continue.  Losartan on hold. Hyperlipidemia, on  atorvastatin.  Continue. GERD, on PPI.  Continued.   DVT prophylaxis: enoxaparin (LOVENOX) injection 40 mg Start: 06/15/22 2200 SCDs Start: 06/15/22 1429   Code Status: Full code Family Communication: Daughter on the phone. Disposition Plan: Status is: Inpatient Remains inpatient appropriate because: IV antibiotics     Consultants:  None  Procedures:  None  Antimicrobials:  Rocephin 5/12----   Subjective:  Patient seen and examined.  Denies any thing new.  Patient tells me that she made it through the night. Remains afebrile. Poor appetite. Significant difficulties with mobility.  Objective: Vitals:   06/16/22 1846 06/16/22 2016 06/17/22 0628 06/17/22 1338  BP: (!) 178/81 (!) 149/78 (!) 173/79 (!) 144/65  Pulse: 94 91 94 93  Resp: 17 18 18    Temp: 98.7 F (37.1 C) 98 F (36.7 C) 97.7 F (36.5 C) 97.6 F (36.4 C)  TempSrc: Oral Oral Oral Oral  SpO2: 96% 96% 100% 97%  Weight:      Height:        Intake/Output Summary (Last 24 hours) at 06/17/2022 1346 Last data filed at 06/17/2022 0600 Gross per 24 hour  Intake --  Output 500 ml  Net -500 ml    Filed Weights   06/15/22 1038  Weight: 47.3 kg    Examination:  General exam: Appears calm and comfortable.  Frail.  Not in distress.   Respiratory system: No added sounds.  On 2 L oxygen. Cardiovascular system: S1 & S2 heard, RRR. No pedal edema. Gastrointestinal system: Abdomen is nondistended, soft and nontender. No organomegaly or masses felt. Normal bowel sounds heard. Central nervous system: Alert and awake.  Pleasant  to conversation.  She is oriented to herself and her family.  Not oriented to time or place.     Data Reviewed: I have personally reviewed following labs and imaging studies  CBC: Recent Labs  Lab 06/15/22 1047 06/16/22 0459  WBC 12.6* 8.4  NEUTROABS 10.7*  --   HGB 10.2* 10.1*  HCT 33.5* 33.1*  MCV 101.2* 99.1  PLT 227 282    Basic Metabolic Panel: Recent Labs  Lab  06/15/22 1047 06/16/22 0459  NA 133* 133*  K 4.5 4.5  CL 92* 91*  CO2 31 30  GLUCOSE 85 99  BUN 20 20  CREATININE 0.62 0.84  CALCIUM 9.1 8.9    GFR: Estimated Creatinine Clearance: 39.6 mL/min (by C-G formula based on SCr of 0.84 mg/dL). Liver Function Tests: Recent Labs  Lab 06/15/22 1047  AST 20  ALT 12  ALKPHOS 89  BILITOT 0.6  PROT 6.8  ALBUMIN 3.3*    No results for input(s): "LIPASE", "AMYLASE" in the last 168 hours. No results for input(s): "AMMONIA" in the last 168 hours. Coagulation Profile: No results for input(s): "INR", "PROTIME" in the last 168 hours. Cardiac Enzymes: No results for input(s): "CKTOTAL", "CKMB", "CKMBINDEX", "TROPONINI" in the last 168 hours. BNP (last 3 results) No results for input(s): "PROBNP" in the last 8760 hours. HbA1C: No results for input(s): "HGBA1C" in the last 72 hours. CBG: No results for input(s): "GLUCAP" in the last 168 hours. Lipid Profile: No results for input(s): "CHOL", "HDL", "LDLCALC", "TRIG", "CHOLHDL", "LDLDIRECT" in the last 72 hours. Thyroid Function Tests: No results for input(s): "TSH", "T4TOTAL", "FREET4", "T3FREE", "THYROIDAB" in the last 72 hours. Anemia Panel: No results for input(s): "VITAMINB12", "FOLATE", "FERRITIN", "TIBC", "IRON", "RETICCTPCT" in the last 72 hours. Sepsis Labs: Recent Labs  Lab 06/15/22 1130 06/15/22 1649  LATICACIDVEN 1.0 1.2     Recent Results (from the past 240 hour(s))  Culture, blood (routine x 2)     Status: None (Preliminary result)   Collection Time: 06/15/22 11:15 AM   Specimen: BLOOD LEFT ARM  Result Value Ref Range Status   Specimen Description   Final    BLOOD LEFT ARM Performed at [facility], [address] [place], [place] [address]    Special Requests   Final    BOTTLES DRAWN AEROBIC AND ANAEROBIC Blood Culture results may not be optimal due to an inadequate volume of blood received in culture bottles Performed at [facility],  [address] [place], [place] [address]    Culture   Final    NO GROWTH 2 DAYS Performed at [facility], [address] [place], [place] [address]    Report Status PENDING  Incomplete  Culture, blood (routine x 2)     Status: None (Preliminary result)   Collection Time: 06/15/22 11:30 AM   Specimen: BLOOD RIGHT ARM  Result Value Ref Range Status   Specimen Description   Final    BLOOD RIGHT ARM Performed at [facility], [address] [place], [place] [address]    Special Requests   Final    BOTTLES DRAWN AEROBIC AND ANAEROBIC Blood Culture results may not be optimal due to an inadequate volume of blood received in culture bottles Performed at [facility], [address] [place], [place] [address]    Culture   Final    NO GROWTH 2 DAYS Performed at [facility], [address] [place], [place] [address]  Report Status PENDING  Incomplete  SARS Coronavirus 2 by RT PCR (hospital order, performed in [facility] hospital lab) *cepheid single result test* Anterior Nasal Swab     Status: None   Collection Time: 06/15/22 11:43 AM   Specimen: Anterior Nasal Swab  Result Value Ref Range Status   SARS Coronavirus 2 by RT PCR NEGATIVE NEGATIVE Final    Comment: (NOTE) SARS-CoV-2 target nucleic acids are NOT DETECTED.  The SARS-CoV-2 RNA is generally detectable in upper and lower respiratory specimens during the acute phase of infection. The lowest concentration of SARS-CoV-2 viral copies this assay can detect is 250 copies / mL. A negative result does not preclude SARS-CoV-2 infection and should not be used as the sole basis for treatment or other patient management decisions.  A negative result may occur with improper specimen collection / handling, submission of specimen other than nasopharyngeal swab, presence of viral mutation(s) within the areas targeted by this assay, and inadequate number of viral copies (<250 copies / mL). A  negative result must be combined with clinical observations, patient history, and epidemiological information.  Fact Sheet for Patients:   RoadLapTop.co.za  Fact Sheet for Healthcare Providers: http://kim-miller.com/  This test is not yet approved or  cleared by the [place] FDA and has been authorized for detection and/or diagnosis of SARS-CoV-2 by FDA under an Emergency Use Authorization (EUA).  This EUA will remain in effect (meaning this test can be used) for the duration of the COVID-19 declaration under Section 564(b)(1) of the Act, 21 U.S.C. section 360bbb-3(b)(1), unless the authorization is terminated or revoked sooner.  Performed at [facility], [address], [place], [place] [address]   Urine Culture (for pregnant, neutropenic or urologic patients or patients with an indwelling urinary catheter)     Status: Abnormal   Collection Time: 06/15/22  2:40 PM   Specimen: Urine, Clean Catch  Result Value Ref Range Status   Specimen Description   Final    URINE, CLEAN CATCH Performed at [facility], [address] [place], [place] [address]    Special Requests   Final    Immunocompromised Performed at [facility], [address], [place], [place] [address]    Culture >=100,000 COLONIES/mL ESCHERICHIA COLI (A)  Final   Report Status 06/17/2022 FINAL  Final   Organism ID, Bacteria ESCHERICHIA COLI (A)  Final      Susceptibility   Escherichia coli - MIC*    AMPICILLIN >=32 RESISTANT Resistant     CEFAZOLIN <=4 SENSITIVE Sensitive     CEFEPIME <=0.12 SENSITIVE Sensitive     CEFTRIAXONE <=0.25 SENSITIVE Sensitive     CIPROFLOXACIN <=0.25 SENSITIVE Sensitive     GENTAMICIN <=1 SENSITIVE Sensitive     IMIPENEM <=0.25 SENSITIVE Sensitive     NITROFURANTOIN <=16 SENSITIVE Sensitive     TRIMETH/SULFA <=20 SENSITIVE Sensitive     AMPICILLIN/SULBACTAM 16 INTERMEDIATE  Intermediate     PIP/TAZO <=4 SENSITIVE Sensitive     * >=100,000 COLONIES/mL ESCHERICHIA COLI         Radiology Studies: No results found.      Scheduled Meds:  amLODipine  5 mg Oral Daily   aspirin EC  81 mg Oral Daily   atorvastatin  20 mg Oral QPM   carvedilol  3.125 mg Oral BID WC   docusate sodium  100 mg Oral BID   enoxaparin (LOVENOX) injection  40 mg Subcutaneous QHS   gabapentin  100 mg Oral  BID   hydroxychloroquine  200 mg Oral Daily   mirtazapine  7.5 mg Oral QHS   montelukast  10 mg Oral QHS   nystatin  5 mL Oral QID   pantoprazole  40 mg Oral Daily   predniSONE  15 mg Oral Q breakfast   sertraline  150 mg Oral Daily   Continuous Infusions:  cefTRIAXone (ROCEPHIN)  IV 1 g (06/17/22 1251)     LOS: 1 day    Time spent: 35 minutes    Dorcas Carrow, MD [facility] Pager [phone]

## 2022-06-18 ENCOUNTER — Ambulatory Visit
Admission: RE | Admit: 2022-06-18 | Discharge: 2022-06-18 | Disposition: A | Discharge status: Home / Self Care | Payer: Medicare PPO | Source: Ambulatory Visit | Attending: Radiation Oncology | Admitting: Radiation Oncology

## 2022-06-18 ENCOUNTER — Other Ambulatory Visit: Payer: Self-pay

## 2022-06-18 DIAGNOSIS — N3000 Acute cystitis without hematuria: Secondary | ICD-10-CM | POA: Diagnosis not present

## 2022-06-18 LAB — RAD ONC ARIA SESSION SUMMARY
Course Elapsed Days: 2
Plan Fractions Treated to Date: 3
Plan Prescribed Dose Per Fraction: 5 Gy
Plan Total Fractions Prescribed: 10
Plan Total Prescribed Dose: 50 Gy
Reference Point Dosage Given to Date: 15 Gy
Reference Point Session Dosage Given: 5 Gy
Session Number: 3

## 2022-06-18 MED ORDER — IRBESARTAN 300 MG PO TABS
300.0000 mg | ORAL_TABLET | Freq: Every day | ORAL | Status: DC
  Administered 2022-06-18 – 2022-06-19 (×2): 300 mg via ORAL
  Filled 2022-06-18 (×2): qty 1

## 2022-06-18 MED ORDER — TRAMADOL HCL 50 MG PO TABS: 50.0000 mg | ORAL_TABLET | Freq: Three times a day (TID) | ORAL | 0 refills | Status: DC | PRN

## 2022-06-18 MED ORDER — CEPHALEXIN 500 MG PO CAPS: 500.0000 mg | ORAL_CAPSULE | Freq: Three times a day (TID) | ORAL | 0 refills | Status: AC

## 2022-06-18 MED ORDER — CEPHALEXIN 500 MG PO CAPS
500.0000 mg | ORAL_CAPSULE | Freq: Three times a day (TID) | ORAL | Status: DC
  Administered 2022-06-19: 500 mg via ORAL
  Filled 2022-06-18: qty 1

## 2022-06-18 MED ORDER — PREDNISONE 5 MG PO TABS
5.0000 mg | ORAL_TABLET | Freq: Every day | ORAL | Status: DC
  Administered 2022-06-19: 5 mg via ORAL
  Filled 2022-06-18: qty 1

## 2022-06-18 NOTE — Discharge Summary (Signed)
Physician Discharge Summary  Desiree [name] OZH:[id number] DOB: 1945/10/21 DOA: 06/15/2022  PCP: Patient, No Pcp Per  Admit date: 06/15/2022 Discharge date: 06/19/2022  Admitted From: ALF Disposition:  SNF  Discharge Condition:Stable CODE STATUS:FULL Diet recommendation: Heart Healthy  Brief/Interim Summary: 77 year old with history of colon cancer and lung cancer, rheumatoid arthritis on Plaquenil and prednisone, essential hypertension, COPD and chronic hypoxemia on 2 L oxygen, recent new lung mass and plan for radiation starting this week brought from the assisted living facility with weakness, lethargy, more confused than usual.  Patient is confused.  Poor historian.  Reportedly she was having more nausea and she was not tolerating diet. In the emergency department hemodynamically stable.  .  Urinalysis consistent with UTI.  Given IV fluids, IV antibiotics and admitted to hospital.  Urine culture showed E. coli.  PT/OT recommending SNFon discharge.  Medically stable for discharge whenever possible.  Following problems were addressed during the hospitalization:  Acute UTI present on admission: Acute metabolic encephalopathy in a patient with dementia secondary to acute UTI. Blood cultures negative.  Urine culture with E. coli.Abx changed to oral Voiding adequately.  No evidence of postvoid residual.   Acute metabolic encephalopathy in a patient with dementia: Severely debilitated. No focal deficits.  Fall precautions. Unable to go back to assisted living facility. Refer to a SNF.   Lung cancer: Recurrent.  She is scheduled for radiation therapy and she was getting it in the hospital.    Alcohol use: Previous history of alcoholism. Not in withdrawl   Rheumatoid arthritis: On Plaquenil and prednisone that will be continued.  Essential hypertension: on Coreg ,amlodipine,ARB  Hyperlipidemia: on atorvastatin.  Continue.  GERD: on PPI.  Continued.   Discharge Diagnoses:   Principal Problem:   UTI (urinary tract infection) Active Problems:   COPD (chronic obstructive pulmonary disease) (HCC)   Gastro-esophageal reflux disease without esophagitis   Rheumatoid arthritis (HCC)   Essential hypertension   Type 2 diabetes mellitus without complications [facility])    Discharge Instructions  Discharge Instructions     Diet - low sodium heart healthy   Complete by: As directed    Discharge instructions   Complete by: As directed    1)Please take prescribed medications as instructed   Increase activity slowly   Complete by: As directed       Allergies as of 06/19/2022       Reactions   Other Shortness Of Breath   Cats   Oxycodone Other (See Comments)   hallucinations        Medication List     TAKE these medications    acetaminophen 325 MG tablet Commonly known as: TYLENOL Take 325 mg by mouth every 6 (six) hours as needed for moderate pain or headache.   albuterol 108 (90 Base) MCG/ACT inhaler Commonly known as: VENTOLIN HFA Inhale 2 puffs into the lungs every 4 (four) hours as needed for shortness of breath (shortness of breath (related to COPD)).   amLODipine 5 MG tablet Commonly known as: NORVASC Take 1 tablet (5 mg total) by mouth daily. Patient needs appointment for further refills. 1 st attempt   aspirin EC 81 MG tablet Take 81 mg by mouth daily.   atorvastatin 20 MG tablet Commonly known as: LIPITOR Take 1 tablet (20 mg total) by mouth daily. Patient needs to keep appointment for further refills. 1 st attempt What changed: when to take this   Breztri Aerosphere 160-9-4.8 MCG/ACT Aero Generic drug: Budeson-Glycopyrrol-Formoterol Inhale 2 puffs into  the lungs 2 (two) times daily.   carvedilol 3.125 MG tablet Commonly known as: COREG Take 1 tablet (3.125 mg total) by mouth 2 (two) times daily with a meal.   cephALEXin 500 MG capsule Commonly known as: KEFLEX Take 1 capsule (500 mg total) by mouth every 8 (eight) hours for 1  day.   cyanocobalamin 1000 MCG tablet Commonly known as: VITAMIN B12 Take 1,000 mcg by mouth daily.   D3-1000 25 MCG (1000 UT) capsule Generic drug: Cholecalciferol Take 1,000 Units by mouth daily.   fexofenadine 180 MG tablet Commonly known as: ALLEGRA Take 180 mg by mouth daily.   gabapentin 100 MG capsule Commonly known as: NEURONTIN Take 100 mg by mouth 2 (two) times daily.   hydroxychloroquine 200 MG tablet Commonly known as: PLAQUENIL Take 200 mg by mouth daily.   Iron 325 (65 Fe) MG Tabs Take 1 tablet by mouth daily.   leflunomide 10 MG tablet Commonly known as: ARAVA Take 10 mg by mouth daily.   lidocaine 4 % Place 1 patch onto the skin daily.   loperamide 2 MG tablet Commonly known as: IMODIUM A-D Take 2 mg by mouth as needed for diarrhea or loose stools (MAX 10 PILLS/24 HRS.).   meclizine 12.5 MG tablet Commonly known as: ANTIVERT Take 12.5 mg by mouth 2 (two) times daily as needed for dizziness.   mirtazapine 7.5 MG tablet Commonly known as: REMERON Take 7.5 mg by mouth at bedtime.   montelukast 10 MG tablet Commonly known as: SINGULAIR Take 1 tablet (10 mg total) by mouth at bedtime.   omeprazole 20 MG capsule Commonly known as: PRILOSEC Take 1 capsule (20 mg total) by mouth daily.   ondansetron 4 MG disintegrating tablet Commonly known as: ZOFRAN-ODT Take 1 tablet (4 mg total) by mouth every 8 (eight) hours as needed for nausea or vomiting.   Oystercal-D 500-10 MG-MCG Tabs Generic drug: Calcium Carb-Cholecalciferol Take 1 tablet by mouth daily.   predniSONE 5 MG tablet Commonly known as: DELTASONE Take 5 mg by mouth daily with breakfast.   Sertraline HCl 150 MG Caps Take 1 capsule by mouth daily.   tiZANidine 2 MG tablet Commonly known as: ZANAFLEX Take 2 mg by mouth 2 (two) times daily.   traMADol 50 MG tablet Commonly known as: ULTRAM Take 1 tablet (50 mg total) by mouth 3 (three) times daily as needed for moderate pain (pain).    valsartan 320 MG tablet Commonly known as: DIOVAN Take 320 mg by mouth in the morning.        Allergies  Allergen Reactions   Other Shortness Of Breath    Cats   Oxycodone Other (See Comments)    hallucinations    Consultations:    Procedures/Studies: DG Chest Port 1 View  Result Date: 06/15/2022 CLINICAL DATA:  Weakness. EXAM: PORTABLE CHEST 1 VIEW COMPARISON:  06/04/2022 FINDINGS: The lungs are clear without focal pneumonia, edema, pneumothorax or pleural effusion. Interstitial markings are diffusely coarsened with chronic features. Scattered areas of patchy airspace disease and architectural distortion/scarring. Cardiopericardial silhouette is at upper limits of normal for size. Bones are diffusely demineralized. Telemetry leads overlie the chest. IMPRESSION: No substantial change since 06/04/2022. Electronically Signed   By: Kennith Center M.D.   On: 06/15/2022 11:33      Subjective: Patient seen and examined at bedside this afternoon.  Hemodynamically stable.  Comfortable without any complaints.  Medically stable for SNF whenever possible  Discharge Exam: Vitals:   06/19/22 0104 06/19/22 1610  BP: (!) 173/82 (!) 144/68  Pulse: 91 90  Resp: 17 18  Temp: 97.6 F (36.4 C) (!) 97.5 F (36.4 C)  SpO2: 99% 100%   Vitals:   06/18/22 1724 06/18/22 2026 06/19/22 0104 06/19/22 0604  BP: (!) 149/79 (!) 159/78 (!) 173/82 (!) 144/68  Pulse: 77 82 91 90  Resp:  18 17 18   Temp:  98.3 F (36.8 C) 97.6 F (36.4 C) (!) 97.5 F (36.4 C)  TempSrc:  Oral Oral Oral  SpO2:  94% 99% 100%  Weight:      Height:        General: Pt is alert, awake, not in acute distress, deconditioned, Cardiovascular: RRR, S1/S2 +, no rubs, no gallops Respiratory: CTA bilaterally, no wheezing, no rhonchi Abdominal: Soft, NT, ND, bowel sounds + Extremities: no edema, no cyanosis    The results of significant diagnostics from this hospitalization (including imaging, microbiology, ancillary and  laboratory) are listed below for reference.     Microbiology: Recent Results (from the past 240 hour(s))  Culture, blood (routine x 2)     Status: None (Preliminary result)   Collection Time: 06/15/22 11:15 AM   Specimen: BLOOD LEFT ARM  Result Value Ref Range Status   Specimen Description   Final    BLOOD LEFT ARM Performed at [facility], [address], [place], [place] [address]    Special Requests   Final    BOTTLES DRAWN AEROBIC AND ANAEROBIC Blood Culture results may not be optimal due to an inadequate volume of blood received in culture bottles Performed at [facility], [address], [place], [place] [address]    Culture   Final    NO GROWTH 4 DAYS Performed at [facility], [address], [place], [place] [address]    Report Status PENDING  Incomplete  Culture, blood (routine x 2)     Status: None (Preliminary result)   Collection Time: 06/15/22 11:30 AM   Specimen: BLOOD RIGHT ARM  Result Value Ref Range Status   Specimen Description   Final    BLOOD RIGHT ARM Performed at [facility], [address], [place], [place] [address]    Special Requests   Final    BOTTLES DRAWN AEROBIC AND ANAEROBIC Blood Culture results may not be optimal due to an inadequate volume of blood received in culture bottles Performed at [facility], [address], [place], [place] [address]    Culture   Final    NO GROWTH 4 DAYS Performed at [facility], [address] [place], [place] [address]    Report Status PENDING  Incomplete  SARS Coronavirus 2 by RT PCR (hospital order, performed in [facility] hospital lab) *cepheid single result test* Anterior Nasal Swab     Status: None   Collection Time: 06/15/22 11:43 AM   Specimen: Anterior Nasal Swab  Result Value Ref Range Status   SARS Coronavirus 2 by RT PCR NEGATIVE NEGATIVE Final    Comment: (NOTE) SARS-CoV-2 target nucleic acids are NOT DETECTED.  The SARS-CoV-2  RNA is generally detectable in upper and lower respiratory specimens during the acute phase of infection. The lowest concentration of SARS-CoV-2 viral copies this assay can detect is 250 copies / mL. A negative result does not preclude SARS-CoV-2 infection and should not be used as the sole basis for treatment or other patient management decisions.  A negative result may occur with improper specimen collection /  handling, submission of specimen other than nasopharyngeal swab, presence of viral mutation(s) within the areas targeted by this assay, and inadequate number of viral copies (<250 copies / mL). A negative result must be combined with clinical observations, patient history, and epidemiological information.  Fact Sheet for Patients:   RoadLapTop.co.za  Fact Sheet for Healthcare Providers: http://kim-miller.com/  This test is not yet approved or  cleared by the [place] FDA and has been authorized for detection and/or diagnosis of SARS-CoV-2 by FDA under an Emergency Use Authorization (EUA).  This EUA will remain in effect (meaning this test can be used) for the duration of the COVID-19 declaration under Section 564(b)(1) of the Act, 21 U.S.C. section 360bbb-3(b)(1), unless the authorization is terminated or revoked sooner.  Performed at [facility], [address] [place], [place] [address]   Urine Culture (for pregnant, neutropenic or urologic patients or patients with an indwelling urinary catheter)     Status: Abnormal   Collection Time: 06/15/22  2:40 PM   Specimen: Urine, Clean Catch  Result Value Ref Range Status   Specimen Description   Final    URINE, CLEAN CATCH Performed at [facility], [address], [place], [place] [address]    Special Requests   Final    Immunocompromised Performed at [facility], [address], [place], [place] [address]     Culture >=100,000 COLONIES/mL ESCHERICHIA COLI (A)  Final   Report Status 06/17/2022 FINAL  Final   Organism ID, Bacteria ESCHERICHIA COLI (A)  Final      Susceptibility   Escherichia coli - MIC*    AMPICILLIN >=32 RESISTANT Resistant     CEFAZOLIN <=4 SENSITIVE Sensitive     CEFEPIME <=0.12 SENSITIVE Sensitive     CEFTRIAXONE <=0.25 SENSITIVE Sensitive     CIPROFLOXACIN <=0.25 SENSITIVE Sensitive     GENTAMICIN <=1 SENSITIVE Sensitive     IMIPENEM <=0.25 SENSITIVE Sensitive     NITROFURANTOIN <=16 SENSITIVE Sensitive     TRIMETH/SULFA <=20 SENSITIVE Sensitive     AMPICILLIN/SULBACTAM 16 INTERMEDIATE Intermediate     PIP/TAZO <=4 SENSITIVE Sensitive     * >=100,000 COLONIES/mL ESCHERICHIA COLI     Labs: BNP (last 3 results) Recent Labs    06/15/22 1047  BNP 208.9*   Basic Metabolic Panel: Recent Labs  Lab 06/15/22 1047 06/16/22 0459  NA 133* 133*  K 4.5 4.5  CL 92* 91*  CO2 31 30  GLUCOSE 85 99  BUN 20 20  CREATININE 0.62 0.84  CALCIUM 9.1 8.9   Liver Function Tests: Recent Labs  Lab 06/15/22 1047  AST 20  ALT 12  ALKPHOS 89  BILITOT 0.6  PROT 6.8  ALBUMIN 3.3*   No results for input(s): "LIPASE", "AMYLASE" in the last 168 hours. No results for input(s): "AMMONIA" in the last 168 hours. CBC: Recent Labs  Lab 06/15/22 1047 06/16/22 0459  WBC 12.6* 8.4  NEUTROABS 10.7*  --   HGB 10.2* 10.1*  HCT 33.5* 33.1*  MCV 101.2* 99.1  PLT 227 282   Cardiac Enzymes: No results for input(s): "CKTOTAL", "CKMB", "CKMBINDEX", "TROPONINI" in the last 168 hours. BNP: Invalid input(s): "POCBNP" CBG: No results for input(s): "GLUCAP" in the last 168 hours. D-Dimer No results for input(s): "DDIMER" in the last 72 hours. Hgb A1c No results for input(s): "HGBA1C" in the last 72 hours. Lipid Profile No results for input(s): "CHOL", "HDL", "LDLCALC", "TRIG", "CHOLHDL", "LDLDIRECT" in the last 72 hours. Thyroid function  studies No results for input(s): "TSH",  "T4TOTAL", "T3FREE", "THYROIDAB" in the last 72 hours.  Invalid input(s): "FREET3" Anemia work up No results for input(s): "VITAMINB12", "FOLATE", "FERRITIN", "TIBC", "IRON", "RETICCTPCT" in the last 72 hours. Urinalysis    Component Value Date/Time   COLORURINE YELLOW 06/15/2022 1205   APPEARANCEUR TURBID (A) 06/15/2022 1205   LABSPEC 1.025 06/15/2022 1205   PHURINE 6.0 06/15/2022 1205   GLUCOSEU NEGATIVE 06/15/2022 1205   HGBUR TRACE (A) 06/15/2022 1205   BILIRUBINUR NEGATIVE 06/15/2022 1205   KETONESUR NEGATIVE 06/15/2022 1205   PROTEINUR TRACE (A) 06/15/2022 1205   UROBILINOGEN 0.2 11/23/2014 1558   NITRITE NEGATIVE 06/15/2022 1205   LEUKOCYTESUR TRACE (A) 06/15/2022 1205   Sepsis Labs Recent Labs  Lab 06/15/22 1047 06/16/22 0459  WBC 12.6* 8.4   Microbiology Recent Results (from the past 240 hour(s))  Culture, blood (routine x 2)     Status: None (Preliminary result)   Collection Time: 06/15/22 11:15 AM   Specimen: BLOOD LEFT ARM  Result Value Ref Range Status   Specimen Description   Final    BLOOD LEFT ARM Performed at [facility], [address] [place], [place] [address]    Special Requests   Final    BOTTLES DRAWN AEROBIC AND ANAEROBIC Blood Culture results may not be optimal due to an inadequate volume of blood received in culture bottles Performed at [facility], [address] [place], [place] [address]    Culture   Final    NO GROWTH 4 DAYS Performed at [facility], [address], [place], [place] [address]    Report Status PENDING  Incomplete  Culture, blood (routine x 2)     Status: None (Preliminary result)   Collection Time: 06/15/22 11:30 AM   Specimen: BLOOD RIGHT ARM  Result Value Ref Range Status   Specimen Description   Final    BLOOD RIGHT ARM Performed at [facility], [address], [place], [place] [address]    Special Requests   Final    BOTTLES DRAWN AEROBIC AND ANAEROBIC Blood Culture  results may not be optimal due to an inadequate volume of blood received in culture bottles Performed at [facility], [address] [place], [place] [address]    Culture   Final    NO GROWTH 4 DAYS Performed at [facility], [address] [place], [place] [address]    Report Status PENDING  Incomplete  SARS Coronavirus 2 by RT PCR (hospital order, performed in [facility] hospital lab) *cepheid single result test* Anterior Nasal Swab     Status: None   Collection Time: 06/15/22 11:43 AM   Specimen: Anterior Nasal Swab  Result Value Ref Range Status   SARS Coronavirus 2 by RT PCR NEGATIVE NEGATIVE Final    Comment: (NOTE) SARS-CoV-2 target nucleic acids are NOT DETECTED.  The SARS-CoV-2 RNA is generally detectable in upper and lower respiratory specimens during the acute phase of infection. The lowest concentration of SARS-CoV-2 viral copies this assay can detect is 250 copies / mL. A negative result does not preclude SARS-CoV-2 infection and should not be used as the sole basis for treatment or other patient management decisions.  A negative result may occur with improper specimen collection / handling, submission of specimen other than nasopharyngeal swab, presence of viral mutation(s) within the areas targeted by this assay, and inadequate number of viral copies (<250 copies / mL). A negative  result must be combined with clinical observations, patient history, and epidemiological information.  Fact Sheet for Patients:   RoadLapTop.co.za  Fact Sheet for Healthcare Providers: http://kim-miller.com/  This test is not yet approved or  cleared by the [place] FDA and has been authorized for detection and/or diagnosis of SARS-CoV-2 by FDA under an Emergency Use Authorization (EUA).  This EUA will remain in effect (meaning this test can be used) for the duration of the COVID-19 declaration under Section  564(b)(1) of the Act, 21 U.S.C. section 360bbb-3(b)(1), unless the authorization is terminated or revoked sooner.  Performed at [facility], [address] [place], [place] [address]   Urine Culture (for pregnant, neutropenic or urologic patients or patients with an indwelling urinary catheter)     Status: Abnormal   Collection Time: 06/15/22  2:40 PM   Specimen: Urine, Clean Catch  Result Value Ref Range Status   Specimen Description   Final    URINE, CLEAN CATCH Performed at [facility], [address] [place], [place] [address]    Special Requests   Final    Immunocompromised Performed at [facility], [address] [place], [place] [address]    Culture >=100,000 COLONIES/mL ESCHERICHIA COLI (A)  Final   Report Status 06/17/2022 FINAL  Final   Organism ID, Bacteria ESCHERICHIA COLI (A)  Final      Susceptibility   Escherichia coli - MIC*    AMPICILLIN >=32 RESISTANT Resistant     CEFAZOLIN <=4 SENSITIVE Sensitive     CEFEPIME <=0.12 SENSITIVE Sensitive     CEFTRIAXONE <=0.25 SENSITIVE Sensitive     CIPROFLOXACIN <=0.25 SENSITIVE Sensitive     GENTAMICIN <=1 SENSITIVE Sensitive     IMIPENEM <=0.25 SENSITIVE Sensitive     NITROFURANTOIN <=16 SENSITIVE Sensitive     TRIMETH/SULFA <=20 SENSITIVE Sensitive     AMPICILLIN/SULBACTAM 16 INTERMEDIATE Intermediate     PIP/TAZO <=4 SENSITIVE Sensitive     * >=100,000 COLONIES/mL ESCHERICHIA COLI    Please note: You were cared for by a hospitalist during your hospital stay. Once you are discharged, your primary care physician will handle any further medical issues. Please note that NO REFILLS for any discharge medications will be authorized once you are discharged, as it is imperative that you return to your primary care physician (or establish a relationship with a primary care physician if you do not have one) for your post hospital discharge needs so that they can  reassess your need for medications and monitor your lab values.    Time coordinating discharge: 40 minutes  SIGNED:   Burnadette Pop, MD  [facility] 06/19/2022, 9:52 AM Pager [phone]  If 7PM-7AM, please contact night-coverage [email or web address] Password [email or web address]

## 2022-06-18 NOTE — Progress Notes (Signed)
Physical Therapy Treatment Patient Details Name: Desiree [name] MRN: [id number] DOB: Oct 02, 1945 Today's Date: 06/18/2022   History of Present Illness Desiree [name] is a 77 yr old female brought to the hospital with weakness, lethargy, and confusion. She was found to have UTI & acute metabolic encephalopathy. 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 Of note, pt was recently found to have a new lung mass with plans to start radiation    PT Comments    Pt received supine in bed on 3L O2, sats at 93%. Pt assisted to EOB with ModA. Fair upright sitting balance for ~2 minutes due to SOB, SpO2 at 97%. Pt stood from bed to RW several times due to difficulty maintaining balance, tremorous throughout, L LE>R LE. Pt required ModA to ambulate short distance to recliner with RW and ModA. Once sitting for B LE there ex. Pt struggled to control L LE involuntary movement, which she states has happened previously. Pt remains at a high fall risk due to L LE dyskinesia. Chair alarm placed, all needs in reach. Pt awaiting transition to SNF.   Recommendations for follow up therapy are one component of a multi-disciplinary discharge planning process, led by the attending physician.  Recommendations may be updated based on patient status, additional functional criteria and insurance authorization.  Follow Up Recommendations  Can patient physically be transported by private vehicle: No    Assistance Recommended at Discharge Frequent or constant Supervision/Assistance  Patient can return home with the following A lot of help with walking and/or transfers;A lot of help with bathing/dressing/bathroom;Assistance with cooking/housework;Help with stairs or ramp for entrance   Equipment Recommendations  Rolling walker (2 wheels)    Recommendations for Other Services       Precautions / Restrictions Precautions Precautions: Fall Restrictions Weight Bearing Restrictions: No Other  Position/Activity Restrictions: 3L O2 use aroud the clock at her baseline     Mobility  Bed Mobility Overal bed mobility: Needs Assistance Bed Mobility: Supine to Sit     Supine to sit: Mod assist, HOB elevated     General bed mobility comments:  (Easily fatigued with minimal exertion)    Transfers Overall transfer level: Needs assistance Equipment used: Rolling walker (2 wheels) Transfers: Sit to/from Stand Sit to Stand: Min assist, Mod assist           General transfer comment:  (Min/ModA to stand due to increased "shakiness" in standing)    Ambulation/Gait Ambulation/Gait assistance: Mod assist Gait Distance (Feet): 4 Feet Assistive device: Rolling walker (2 wheels) Gait Pattern/deviations: Step-to pattern, Decreased stride length, Ataxic, Narrow base of support Gait velocity: decreased     General Gait Details:  (Poor balance and decreased motor control through L LE)   Stairs             Wheelchair Mobility    Modified Rankin (Stroke Patients Only)       Balance Overall balance assessment: Needs assistance Sitting-balance support: Bilateral upper extremity supported, Feet supported Sitting balance-Leahy Scale: Fair     Standing balance support: Bilateral upper extremity supported, During functional activity, Reliant on assistive device for balance Standing balance-Leahy Scale: Poor                              Cognition Arousal/Alertness: Awake/alert Behavior During Therapy: WFL for tasks assessed/performed Overall Cognitive Status: Within Functional Limits for tasks assessed  General Comments: Oriented to person, place, month, and year; disoriented to situation. Able to follow 1-2 step commands consistently -        Exercises General Exercises - Lower Extremity Long Arc Quad: AROM, Both, 10 reps, Seated Hip Flexion/Marching: AROM, Both, 10 reps, Seated Toe Raises: AROM, Both, 10  reps, Seated Heel Raises: AROM, Both, 10 reps, Seated    General Comments General comments (skin integrity, edema, etc.): Pt answering questions appropriately. Remained on 3L O2 throught session with SpO2 between 93-97%, DOE      Pertinent Vitals/Pain Pain Assessment Pain Assessment: No/denies pain    Home Living                          Prior Function            PT Goals (current goals can now be found in the care plan section) Acute Rehab PT Goals Patient Stated Goal: return home    Frequency    Min 1X/week      PT Plan Current plan remains appropriate    Co-evaluation              AM-PAC PT "6 Clicks" Mobility   Outcome Measure  Help needed turning from your back to your side while in a flat bed without using bedrails?: A Little Help needed moving from lying on your back to sitting on the side of a flat bed without using bedrails?: A Lot Help needed moving to and from a bed to a chair (including a wheelchair)?: A Lot Help needed standing up from a chair using your arms (e.g., wheelchair or bedside chair)?: A Lot Help needed to walk in hospital room?: A Lot Help needed climbing 3-5 steps with a railing? : Total 6 Click Score: 12    End of Session Equipment Utilized During Treatment: Gait belt;Oxygen Activity Tolerance: Patient tolerated treatment well Patient left: with chair alarm set;in chair;with call bell/phone within reach Nurse Communication: Mobility status PT Visit Diagnosis: Ataxic gait (R26.0);Muscle weakness (generalized) (M62.81)     Time: 1610-9604 PT Time Calculation (min) (ACUTE ONLY): 23 min  Charges:  $Therapeutic Exercise: 8-22 mins $Therapeutic Activity: 8-22 mins                    Zadie Cleverly, PTA  Jannet Askew 06/18/2022, 2:28 PM

## 2022-06-18 NOTE — TOC Progression Note (Addendum)
Transition of Care [facility]) - Progression Note    Patient Details  Name: Desiree [name] MRN: [id number] Date of Birth: 07-May-1945  Transition of Care [facility]) CM/SW Contact  Beckie Busing, RN Phone Number:[phone]  06/18/2022, 10:55 AM  Clinical Narrative:    CM has followed up with [facility] admissions director at [facility]. There is a bed available but Tray will need to present patients info to her supervisor. Barrier for SNF will be daily radiation. CM awaiting return call.   1100 CM attempted to call daughter to clarify if daughter will be able to transport patient to daily radiation treatments once she returns to town on Friday. There is no answer, voicemail has been left.   1154 Insurance Berkley Harvey has been initiated in order to be ready for discharge if facility can accept patient. Insurance Berkley Harvey ID# [id number]  1230 CM spoke with daughter who confirms that she will be back in town on Friday and will be able to transport patient to radiation treatments.   1456 Cm has attempted to call [facility] admissions director at [facility] to determine if facility can offer patient a bed. There is no answer , voicemail has been left.   1500 [facility] can make a bed offer. Insurance Berkley Harvey is still pending.      Barriers to Discharge: SNF Pending bed offer  Expected Discharge Plan and Services In-house Referral: NA Discharge Planning Services: CM Consult Post Acute Care Choice: Nursing Home, Skilled Nursing Facility Living arrangements for the past 2 months: Assisted Living Facility                 DME Arranged: N/A DME Agency: NA       HH Arranged: NA HH Agency: NA         Social Determinants of Health (SDOH) Interventions SDOH Screenings   Food Insecurity: No Food Insecurity (06/16/2022)  Housing: Low Risk  (06/16/2022)  Transportation Needs: No Transportation Needs (06/16/2022)  Utilities: Not At Risk (06/16/2022)  Financial Resource Strain: Low Risk  (10/26/2020)  Social Connections: Socially  Isolated (10/26/2020)  Tobacco Use: Medium Risk (06/15/2022)    Readmission Risk Interventions    06/17/2022   12:54 PM  Readmission Risk Prevention Plan  Transportation Screening Complete  PCP or Specialist Appt within 5-7 Days Complete  Home Care Screening Complete

## 2022-06-19 ENCOUNTER — Ambulatory Visit
Admission: RE | Admit: 2022-06-19 | Discharge: 2022-06-19 | Disposition: A | Discharge status: Home / Self Care | Payer: Medicare PPO | Source: Ambulatory Visit | Attending: Radiation Oncology | Admitting: Radiation Oncology

## 2022-06-19 ENCOUNTER — Other Ambulatory Visit: Payer: Self-pay

## 2022-06-19 DIAGNOSIS — C3432 Malignant neoplasm of lower lobe, left bronchus or lung: Secondary | ICD-10-CM

## 2022-06-19 LAB — RAD ONC ARIA SESSION SUMMARY
Course Elapsed Days: 3
Plan Fractions Treated to Date: 2
Plan Fractions Treated to Date: 4
Plan Prescribed Dose Per Fraction: 10 Gy
Plan Prescribed Dose Per Fraction: 5 Gy
Plan Total Fractions Prescribed: 10
Plan Total Fractions Prescribed: 5
Plan Total Prescribed Dose: 50 Gy
Plan Total Prescribed Dose: 50 Gy
Reference Point Dosage Given to Date: 20 Gy
Reference Point Dosage Given to Date: 20 Gy
Reference Point Session Dosage Given: 10 Gy
Reference Point Session Dosage Given: 5 Gy
Session Number: 4

## 2022-06-19 NOTE — Care Management Important Message (Signed)
Important Message  Patient Details IM Letter placed in room. Name: Desiree [name] MRN: [id number] Date of Birth: November 21, 1945   Medicare Important Message Given:  Yes     Caren Macadam 06/19/2022, 11:16 AM

## 2022-06-19 NOTE — Plan of Care (Addendum)
Patient alert and oriented, discharge orders to [facility] in [place], PIV removed, Skin intact, report given to [facility] at 11:25am, awaiting PTAR pickup. 1200: PTAR arrived and will be transporting pt to [facility].   Problem: Education: Goal: Knowledge of General Education information will improve Description: Including pain rating scale, medication(s)/side effects and non-pharmacologic comfort measures Outcome: Adequate for Discharge   Problem: Health Behavior/Discharge Planning: Goal: Ability to manage health-related needs will improve Outcome: Adequate for Discharge   Problem: Clinical Measurements: Goal: Ability to maintain clinical measurements within normal limits will improve Outcome: Adequate for Discharge Goal: Will remain free from infection Outcome: Adequate for Discharge Goal: Diagnostic test results will improve Outcome: Adequate for Discharge Goal: Respiratory complications will improve Outcome: Adequate for Discharge Goal: Cardiovascular complication will be avoided Outcome: Adequate for Discharge   Problem: Activity: Goal: Risk for activity intolerance will decrease Outcome: Adequate for Discharge   Problem: Nutrition: Goal: Adequate nutrition will be maintained Outcome: Adequate for Discharge   Problem: Coping: Goal: Level of anxiety will decrease Outcome: Adequate for Discharge   Problem: Elimination: Goal: Will not experience complications related to bowel motility Outcome: Adequate for Discharge Goal: Will not experience complications related to urinary retention Outcome: Adequate for Discharge   Problem: Pain Managment: Goal: General experience of comfort will improve Outcome: Adequate for Discharge   Problem: Safety: Goal: Ability to remain free from injury will improve Outcome: Adequate for Discharge   Problem: Skin Integrity: Goal: Risk for impaired skin integrity will decrease Outcome: Adequate for Discharge

## 2022-06-19 NOTE — TOC Transition Note (Addendum)
Transition of Care [facility]) - CM/SW Discharge Note   Patient Details  Name: Desiree [name] MRN: [id number] Date of Birth: 1945-05-05  Transition of Care [facility]) CM/SW Contact:  Beckie Busing, RN Phone Number:[phone]  06/19/2022, 10:40 AM   Clinical Narrative:    Insurance [employer] auth ID [id number] Auth ID [id number] 5/15-5/17. CM has verified with Luna Fuse of admissions at [facility] that facility is able to accept patient today. Facility is able to accept patient today.   1100 Discharge summary has been faxed to facility. Daughter was updated yesterday and states that she will be traveling today and unable to answer phone. Discharge packet is at nurses station. Transportation has been arranged per PTAR.   Please call report to  [facility] [phone] Room # 606    Final next level of care: Skilled Nursing Facility Barriers to Discharge: No Barriers Identified   Patient Goals and CMS Choice CMS Medicare.gov Compare Post Acute Care list provided to:: Patient Represenative (must comment) (daughter) Choice offered to / list presented to : Adult Children (daughter Desiree [name] via phone)  Discharge Placement                Patient chooses bed at: [facility], [place] Patient to be transferred to facility by: PTAR Name of family member notified: Daughter notified Patient and family notified of of transfer: 06/18/22  Discharge Plan and Services Additional resources added to the After Visit Summary for   In-house Referral: NA Discharge Planning Services: CM Consult Post Acute Care Choice: Nursing Home, Skilled Nursing Facility          DME Arranged: N/A DME Agency: NA       HH Arranged: NA HH Agency: NA        Social Determinants of Health (SDOH) Interventions SDOH Screenings   Food Insecurity: No Food Insecurity (06/16/2022)  Housing: Low Risk  (06/16/2022)  Transportation Needs: No Transportation Needs (06/16/2022)  Utilities: Not At Risk  (06/16/2022)  Financial Resource Strain: Low Risk  (10/26/2020)  Social Connections: Socially Isolated (10/26/2020)  Tobacco Use: Medium Risk (06/15/2022)     Readmission Risk Interventions    06/17/2022   12:54 PM  Readmission Risk Prevention Plan  Transportation Screening Complete  PCP or Specialist Appt within 5-7 Days Complete  Home Care Screening Complete

## 2022-06-19 NOTE — Progress Notes (Addendum)
Occupational Therapy Treatment Patient Details Name: Desiree [name] MRN: [id number] DOB: May 12, 1945 Today's Date: 06/19/2022   History of present illness Desiree [name] is a 77 yr old female brought to the hospital with weakness, lethargy, and confusion. She was found to have UTI & acute metabolic encephalopathy. 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 Of note, pt was recently found to have a new lung mass with plans to start radiation   OT comments  The pt reported feeling "shaky" today. She also reported feelings of malaise and generalized weakness. She participated in self-care tasks seated EOB, as well as sit to stand & lateral stepping towards the head of the bed using a RW . She required cues for deep breathing, given pt reports of feeling short of breath with activity. Her O2 saturation was taken and noted to be 94% on 3L O2. She will continue to benefit from further OT services to facilitate progressive ADL performance and to maximize her functional independence.    Recommendations for follow up therapy are one component of a multi-disciplinary discharge planning process, led by the attending physician.  Recommendations may be updated based on patient status, additional functional criteria and insurance authorization.    Assistance Recommended at Discharge Frequent or constant Supervision/Assistance  Patient can return home with the following  Direct supervision/assist for medications management;Assist for transportation;A lot of help with walking and/or transfers;A lot of help with bathing/dressing/bathroom   Equipment Recommendations  Other (comment) (to be determined pending progress at next setting)       Precautions / Restrictions Precautions Precautions: Fall Restrictions Weight Bearing Restrictions: No Other Position/Activity Restrictions: 3L O2 use aroud the clock at her baseline       Mobility Bed Mobility Overal bed mobility: Needs  Assistance Bed Mobility: Supine to Sit, Sit to Supine     Supine to sit: Mod assist, HOB elevated Sit to supine: Min assist   General bed mobility comments: she was also instructed on scooting to the [facility], for which she required min assist with the bed in the gravity eliminated position; she was instructed to bend her knees and push with her BLE    Transfers Overall transfer level: Needs assistance Equipment used: Rolling walker (2 wheels) Transfers: Sit to/from Stand Sit to Stand: Mod assist           General transfer comment: She was instructed on lateral stepping towards the [facility] using a RW, for which she needed cues for walker advancement and assistance for balance         ADL either performed or assessed with clinical judgement   ADL Overall ADL's : Needs assistance/impaired Eating/Feeding: Set up;Sitting   Grooming: Set up;Supervision/safety;Sitting Grooming Details (indicate cue type and reason): She performed hand and face washing seated EOB.         Upper Body Dressing : Minimal assistance Upper Body Dressing Details (indicate cue type and reason): simulated seated EOB Lower Body Dressing: Moderate assistance                       Cognition Arousal/Alertness: Awake/alert Behavior During Therapy: WFL for tasks assessed/performed Overall Cognitive Status: Within Functional Limits for tasks assessed                              Pertinent Vitals/ Pain       Pain Assessment Pain Assessment: No/denies  pain         Frequency  Min 1X/week        Progress Toward Goals  OT Goals(current goals can now be found in the care plan section)     Acute Rehab OT Goals Patient Stated Goal: to return to her ALF Time For Goal Achievement: 07/01/22 Potential to Achieve Goals: Good  Plan Discharge plan remains appropriate       AM-PAC OT "6 Clicks" Daily Activity     Outcome Measure   Help from another person eating meals?: None Help from  another person taking care of personal grooming?: A Little Help from another person toileting, which includes using toliet, bedpan, or urinal?: A Lot Help from another person bathing (including washing, rinsing, drying)?: A Lot Help from another person to put on and taking off regular upper body clothing?: A Little Help from another person to put on and taking off regular lower body clothing?: A Lot 6 Click Score: 16    End of Session Equipment Utilized During Treatment: Gait belt;Rolling walker (2 wheels)  OT Visit Diagnosis: Unsteadiness on feet (R26.81);Muscle weakness (generalized) (M62.81)   Activity Tolerance Other (comment) (Fair tolerance. Pt was limited by general malaise, feelings of "shakiness" and weakness)   Patient Left in bed;with call bell/phone within reach;with bed alarm set   Nurse Communication Mobility status        Time: 1050-1110 OT Time Calculation (min): 20 min  Charges: OT General Charges $OT Visit: 1 Visit OT Treatments $Therapeutic Activity: 8-22 mins     Desiree [name] 06/19/2022, 11:32 AM

## 2022-06-19 NOTE — Progress Notes (Signed)
Patient seen and examined the bedside today.  She remains hemodynamically stable without any complaints today.  She has a bed at a skilled nursing facility.  Discharge orders and summary have already been placed.  No new change in the medical management.  I called her son and daughter on their phones for update about dc plan, calls not received

## 2022-06-20 ENCOUNTER — Emergency Department (HOSPITAL_COMMUNITY)
Admission: EM | Admit: 2022-06-20 | Discharge: 2022-06-20 | Disposition: A | Discharge status: Home / Self Care | Payer: Medicare PPO | Attending: Emergency Medicine | Admitting: Emergency Medicine

## 2022-06-20 ENCOUNTER — Telehealth: Payer: Self-pay

## 2022-06-20 ENCOUNTER — Other Ambulatory Visit: Payer: Self-pay

## 2022-06-20 ENCOUNTER — Ambulatory Visit
Admission: RE | Admit: 2022-06-20 | Discharge: 2022-06-20 | Disposition: A | Discharge status: Home / Self Care | Payer: Medicare PPO | Source: Ambulatory Visit | Attending: Radiation Oncology | Admitting: Radiation Oncology

## 2022-06-20 ENCOUNTER — Encounter (HOSPITAL_COMMUNITY): Payer: Self-pay

## 2022-06-20 ENCOUNTER — Emergency Department (HOSPITAL_COMMUNITY): Payer: Medicare PPO

## 2022-06-20 DIAGNOSIS — I2510 Atherosclerotic heart disease of native coronary artery without angina pectoris: Secondary | ICD-10-CM | POA: Diagnosis not present

## 2022-06-20 DIAGNOSIS — Z79899 Other long term (current) drug therapy: Secondary | ICD-10-CM | POA: Insufficient documentation

## 2022-06-20 DIAGNOSIS — Z85038 Personal history of other malignant neoplasm of large intestine: Secondary | ICD-10-CM | POA: Diagnosis not present

## 2022-06-20 DIAGNOSIS — J449 Chronic obstructive pulmonary disease, unspecified: Secondary | ICD-10-CM | POA: Diagnosis not present

## 2022-06-20 DIAGNOSIS — R079 Chest pain, unspecified: Secondary | ICD-10-CM | POA: Diagnosis present

## 2022-06-20 DIAGNOSIS — E039 Hypothyroidism, unspecified: Secondary | ICD-10-CM | POA: Insufficient documentation

## 2022-06-20 DIAGNOSIS — D72829 Elevated white blood cell count, unspecified: Secondary | ICD-10-CM | POA: Insufficient documentation

## 2022-06-20 DIAGNOSIS — I129 Hypertensive chronic kidney disease with stage 1 through stage 4 chronic kidney disease, or unspecified chronic kidney disease: Secondary | ICD-10-CM | POA: Insufficient documentation

## 2022-06-20 DIAGNOSIS — E1122 Type 2 diabetes mellitus with diabetic chronic kidney disease: Secondary | ICD-10-CM | POA: Diagnosis not present

## 2022-06-20 DIAGNOSIS — C3492 Malignant neoplasm of unspecified part of left bronchus or lung: Secondary | ICD-10-CM | POA: Diagnosis present

## 2022-06-20 DIAGNOSIS — R918 Other nonspecific abnormal finding of lung field: Secondary | ICD-10-CM | POA: Diagnosis present

## 2022-06-20 DIAGNOSIS — N183 Chronic kidney disease, stage 3 unspecified: Secondary | ICD-10-CM | POA: Insufficient documentation

## 2022-06-20 DIAGNOSIS — Z7982 Long term (current) use of aspirin: Secondary | ICD-10-CM | POA: Diagnosis not present

## 2022-06-20 DIAGNOSIS — Z87891 Personal history of nicotine dependence: Secondary | ICD-10-CM | POA: Diagnosis not present

## 2022-06-20 DIAGNOSIS — C3432 Malignant neoplasm of lower lobe, left bronchus or lung: Secondary | ICD-10-CM | POA: Insufficient documentation

## 2022-06-20 LAB — RAD ONC ARIA SESSION SUMMARY
Course Elapsed Days: 4
Plan Fractions Treated to Date: 5
Plan Prescribed Dose Per Fraction: 5 Gy
Plan Total Fractions Prescribed: 10
Plan Total Prescribed Dose: 50 Gy
Reference Point Dosage Given to Date: 25 Gy
Reference Point Session Dosage Given: 5 Gy
Session Number: 5

## 2022-06-20 LAB — CBC WITH DIFFERENTIAL/PLATELET
Abs Immature Granulocytes: 0.09 10*3/uL — ABNORMAL HIGH (ref 0.00–0.07)
Basophils Absolute: 0.1 10*3/uL (ref 0.0–0.1)
Basophils Relative: 1 %
Eosinophils Absolute: 0.5 10*3/uL (ref 0.0–0.5)
Eosinophils Relative: 3 %
HCT: 35 % — ABNORMAL LOW (ref 36.0–46.0)
Hemoglobin: 10.9 g/dL — ABNORMAL LOW (ref 12.0–15.0)
Immature Granulocytes: 1 %
Lymphocytes Relative: 3 %
Lymphs Abs: 0.5 10*3/uL — ABNORMAL LOW (ref 0.7–4.0)
MCH: 31.5 pg (ref 26.0–34.0)
MCHC: 31.1 g/dL (ref 30.0–36.0)
MCV: 101.2 fL — ABNORMAL HIGH (ref 80.0–100.0)
Monocytes Absolute: 0.9 10*3/uL (ref 0.1–1.0)
Monocytes Relative: 6 %
Neutro Abs: 13.4 10*3/uL — ABNORMAL HIGH (ref 1.7–7.7)
Neutrophils Relative %: 86 %
Platelets: 219 10*3/uL (ref 150–400)
RBC: 3.46 MIL/uL — ABNORMAL LOW (ref 3.87–5.11)
RDW: 12.8 % (ref 11.5–15.5)
WBC: 15.5 10*3/uL — ABNORMAL HIGH (ref 4.0–10.5)
nRBC: 0 % (ref 0.0–0.2)

## 2022-06-20 LAB — BASIC METABOLIC PANEL
Anion gap: 10 (ref 5–15)
BUN: 18 mg/dL (ref 8–23)
CO2: 30 mmol/L (ref 22–32)
Calcium: 8.9 mg/dL (ref 8.9–10.3)
Chloride: 95 mmol/L — ABNORMAL LOW (ref 98–111)
Creatinine, Ser: 0.92 mg/dL (ref 0.44–1.00)
GFR, Estimated: >60 mL/min (ref >60)
Glucose, Bld: 127 mg/dL — ABNORMAL HIGH (ref 70–99)
Potassium: 3.8 mmol/L (ref 3.5–5.1)
Sodium: 135 mmol/L (ref 135–145)

## 2022-06-20 LAB — TROPONIN I (HIGH SENSITIVITY)
Troponin I (High Sensitivity): 17 ng/L (ref <18)
Troponin I (High Sensitivity): 15 ng/L (ref <18)

## 2022-06-20 LAB — BRAIN NATRIURETIC PEPTIDE: B Natriuretic Peptide: 93.5 pg/mL (ref 0.0–100.0)

## 2022-06-20 LAB — CULTURE, BLOOD (ROUTINE X 2): Culture: NO GROWTH

## 2022-06-20 NOTE — ED Triage Notes (Signed)
Patient transported via wheelchair from radiation oncology. Patient receiving radiation on lungs, had sudden onset chest pain during procedure.  Patient wears 3L o2 at baseline, O2 sats 97% on 3L, HR 94 hx afib. Patient alert & oriented x4, states she feels weaker than usual, denies chest pain or pressure at this time. NAD.

## 2022-06-20 NOTE — Telephone Encounter (Signed)
RN called to inquire about pt coming to radiation today. Rn contacted pt daughter who stated that she was going to bring her to treatment today. She stated she was already in town to help with this. Rn did educate family on where to go and also that we have valet for parking. They were grateful for the call.

## 2022-06-20 NOTE — Care Management (Signed)
No TOC consult as TOC is not currently following this patient.   This RNCM received communication from [facility] with [facility] who reports they are following for HHPT.  Note: [facility] can provide hospice services if needed.

## 2022-06-20 NOTE — ED Provider Notes (Signed)
[place] EMERGENCY DEPARTMENT AT [facility] Provider Note  CSN: [id number] Arrival date & time: 06/20/22 1212  Chief Complaint(s) Chest Pain  HPI Desiree [name] is a 77 y.o. female with past medical history as below, significant for AF, colon cancer, emphysema, COPD, CKD who presents to the ED with complaint of cp following radiation. Pt was on day 5 of radiation therapy, following procedure she began to have chest pain which lasted around 30-60 minutes and has since resolved. She feels back to baseline at this time. On her typical home o2.  Family reports she has been more active than normal today has also received more doses of radiation than she has in the past  Past Medical History Past Medical History:  Diagnosis Date   Abnormal nuclear stress test    AF (paroxysmal atrial fibrillation) (HCC)    Age-related osteoporosis without current pathological fracture 05/16/2016   Alcoholism (HCC) 10/21/2018   Formatting of this note might be different from the original. 2 glasses a day.   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 Wrist surgery   Benign paroxysmal positional vertigo due to bilateral vestibular disorder 02/09/2020   Cancer of ascending colon [facility])    Carcinoma of ascending colon (HCC)    Cat allergies    Centrilobular emphysema (HCC) 05/16/2016   Chronic anemia 04/09/2021   Chronic atrial fibrillation, unspecified (HCC) 10/04/2020   Chronic obstructive pulmonary disease (HCC) 04/09/2021   Chronic pain 04/09/2021   CKD (chronic kidney disease) stage 3, GFR 30-59 ml/min (HCC) 01/04/2018   COPD (chronic obstructive pulmonary disease) (HCC)    Cysts of both ovaries 11/24/2019   Formatting of this note might be different from the original. On CT 11/2019. Referred to GYN.   Dyspnea    Early satiety  04/14/2019   Essential hypertension 08/22/2010   Formatting of this note might be different from the original. Formatting of this note might be different from the original. D/C ACE 08/22/2010 due to concerns with freq aecopd/ pseudoasthma > resolved  Last Assessment & Plan:  Formatting of this note might be different from the original. Adequate control on present rx, reviewed - note she's no longer falling apart during her aecopd's which are less   Gastro-esophageal reflux disease without esophagitis 07/14/2017   Gastrointestinal hemorrhage    History of pulmonary embolus (PE) 07/14/2017   History of radiation therapy    Left lung- SBRT 11/13/20-11/27/20- Dr. 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 Possible???? See CT comments 11/2019.   Other vitamin B12 deficiency  anemias 04/09/2021   Paroxysmal atrial fibrillation (HCC) 04/14/2019   Formatting of this note might be different from the original. [facility].   Prediabetes 07/14/2017   Pulmonary embolism (HCC) 2017   Rheumatoid arthritis (HCC) 09/20/2010   Dr Nickola Major in [place].   Rheumatoid arthritis(714.0)    Dr Azzie Roup follows   Schatzki's ring     Senile purpura (HCC) 01/04/2018   Skin rash 07/14/2017   Bx'd Allergic. Likely meds.   Status post thoracentesis    Symptomatic anemia 07/25/2020   Type 2 diabetes mellitus without complications (HCC) 04/09/2021   Vitamin B12 deficiency 07/14/2017   Vitamin D deficiency 04/09/2021   Patient Active Problem List   Diagnosis Date Noted   UTI (urinary tract infection) 06/15/2022   AF (paroxysmal atrial fibrillation) (HCC) 10/10/2021   History of radiation therapy 04/09/2021   Chronic pain 04/09/2021   Hypothyroidism 04/09/2021   Type 2 diabetes mellitus without complications (HCC) 04/09/2021   Vitamin D deficiency 04/09/2021   Malignant tumor of ascending colon (HCC) 04/09/2021   Chronic obstructive pulmonary disease (HCC) 04/09/2021   Malignant neoplasm of unspecified part of left bronchus or lung (HCC) 04/09/2021   Atherosclerotic heart disease of native coronary artery without angina pectoris 04/09/2021   Chronic anemia 04/09/2021   Other vitamin B12 deficiency anemias 04/09/2021   Chronic atrial fibrillation, unspecified (HCC) 10/04/2020   Carcinoma of ascending colon (HCC) 10/04/2020   Dyspnea 10/04/2020   Lung cancer, lower lobe (HCC) 09/24/2020   Lung nodule, solitary 08/16/2020   Status post thoracentesis    Schatzki's ring    Cancer of ascending colon (HCC)    Gastrointestinal hemorrhage    Cat allergies 07/26/2020   Symptomatic anemia 07/25/2020   Idiopathic peripheral neuropathy 02/23/2020   Benign paroxysmal positional vertigo due to bilateral vestibular disorder 02/09/2020   Cysts of both ovaries 11/24/2019   Other cirrhosis of liver (HCC) 11/24/2019   Early satiety 04/14/2019   Paroxysmal atrial fibrillation (HCC) 04/14/2019   Mild CAD 02/02/2019   Abnormal nuclear stress test    Iron deficiency anemia due to chronic blood loss 10/27/2018   Alcoholism (HCC) 10/21/2018   CKD (chronic kidney disease) stage 3, GFR 30-59 ml/min (HCC) 01/04/2018   Senile purpura  (HCC) 01/04/2018   At high risk for falls 07/14/2017   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HCC) 07/14/2017   Gastro-esophageal reflux disease without esophagitis 07/14/2017   History of pulmonary embolus (PE) 07/14/2017   Malaise and fatigue 07/14/2017   Mild episode of recurrent major depressive disorder (HCC) 07/14/2017   Mixed hyperlipidemia 07/14/2017   Prediabetes 07/14/2017   Skin rash 07/14/2017   Vitamin B12 deficiency 07/14/2017   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 Authorizing Provider  acetaminophen (TYLENOL) 325 MG tablet Take 325 mg by mouth every 6 (six) hours as needed for moderate pain or headache.    [provider]  albuterol (VENTOLIN HFA) 108 (90 Base) MCG/ACT inhaler Inhale 2 puffs into the lungs every 4 (four)  hours as needed for shortness of breath (shortness of breath (related to COPD)).    [provider]  amLODipine (NORVASC) 5 MG tablet Take 1 tablet (5 mg total) by mouth daily. Patient needs appointment for further refills. 1 st attempt 10/02/20   Tobb, Kardie, DO  aspirin EC 81 MG tablet Take 81 mg by mouth daily.    [provider]  atorvastatin (LIPITOR) 20 MG tablet Take 1 tablet (20 mg total) by mouth daily. Patient needs to keep appointment for further refills. 1 st attempt Patient taking differently: Take 20 mg by mouth every evening. Patient needs to keep  appointment for further refills. 1 st attempt 10/05/20   Tobb, Lavona Mound, DO  Budeson-Glycopyrrol-Formoterol (BREZTRI AEROSPHERE) 160-9-4.8 MCG/ACT AERO Inhale 2 puffs into the lungs 2 (two) times daily. 01/19/20   Icard, Rachel Bo, DO  carvedilol (COREG) 3.125 MG tablet Take 1 tablet (3.125 mg total) by mouth 2 (two) times daily with a meal. 08/13/20   Tobb, Kardie, DO  cephALEXin (KEFLEX) 500 MG capsule Take 1 capsule (500 mg total) by mouth every 8 (eight) hours for 1 day. 06/19/22 06/20/22  Burnadette Pop, MD  Cholecalciferol (D3-1000) 25 MCG (1000 UT) capsule Take 1,000 Units by mouth daily.    [provider]  Ferrous Sulfate (IRON) 325 (65 Fe) MG TABS Take 1 tablet by mouth daily. 10/08/21   [provider]  fexofenadine (ALLEGRA) 180 MG tablet Take 180 mg by mouth daily.    [provider]  gabapentin (NEURONTIN) 100 MG capsule Take 100 mg by mouth 2 (two) times daily. 10/01/21   [provider]  hydroxychloroquine (PLAQUENIL) 200 MG tablet Take 200 mg by mouth daily. 09/17/15   [provider]  leflunomide (ARAVA) 10 MG tablet Take 10 mg by mouth daily. 04/05/21   [provider]  lidocaine 4 % Place 1 patch onto the skin daily. 10/11/21   [provider]  loperamide (IMODIUM A-D) 2 MG tablet Take 2 mg by mouth as needed for diarrhea or loose stools (MAX 10 PILLS/24 HRS.).    [provider]  meclizine (ANTIVERT) 12.5 MG tablet Take 12.5 mg by mouth 2 (two) times daily as needed for dizziness. 05/20/22   [provider]  mirtazapine (REMERON) 7.5 MG tablet Take 7.5 mg by mouth at bedtime.    [provider]  montelukast (SINGULAIR) 10 MG tablet Take 1 tablet (10 mg total) by mouth at bedtime. 10/21/20   Icard, Rachel Bo, DO  omeprazole (PRILOSEC) 20 MG capsule Take 1 capsule (20 mg total) by mouth daily. 07/11/20   Icard, Rachel Bo, DO  ondansetron (ZOFRAN-ODT) 4 MG disintegrating tablet Take 1 tablet (4 mg total) by  mouth every 8 (eight) hours as needed for nausea or vomiting. 08/05/20   Fayette Pho, MD  OYSTERCAL-D 500-10 MG-MCG TABS Take 1 tablet by mouth daily. 03/06/22   [provider]  predniSONE (DELTASONE) 5 MG tablet Take 5 mg by mouth daily with breakfast.    [provider]  Sertraline HCl 150 MG CAPS Take 1 capsule by mouth daily. 04/09/22   [provider]  tiZANidine (ZANAFLEX) 2 MG tablet Take 2 mg by mouth 2 (two) times daily. 09/02/21   [provider]  traMADol (ULTRAM) 50 MG tablet Take 1 tablet (50 mg total) by mouth 3 (three) times daily as needed for moderate pain (pain). 06/18/22   Burnadette Pop, MD  valsartan (DIOVAN) 320 MG tablet Take 320 mg by mouth in  the morning.    [provider]  vitamin B-12 (CYANOCOBALAMIN) 1000 MCG tablet Take 1,000 mcg by mouth daily.     [provider]                                                                                                                                    Past Surgical History Past Surgical History:  Procedure Laterality Date   BIOPSY  07/27/2020   Procedure: BIOPSY;  Surgeon: Iva Boop, MD;  Location: [facility] ENDOSCOPY;  Service: Endoscopy;;   BRONCHIAL BIOPSY  09/11/2020   Procedure: BRONCHIAL BIOPSIES;  Surgeon: Josephine Igo, DO;  Location: [facility] ENDOSCOPY;  Service: Pulmonary;;   BRONCHIAL BRUSHINGS  09/11/2020   Procedure: BRONCHIAL BRUSHINGS;  Surgeon: Josephine Igo, DO;  Location: [facility] ENDOSCOPY;  Service: Pulmonary;;   BRONCHIAL NEEDLE ASPIRATION BIOPSY  09/11/2020   Procedure: BRONCHIAL NEEDLE ASPIRATION BIOPSIES;  Surgeon: Josephine Igo, DO;  Location: [facility] ENDOSCOPY;  Service: Pulmonary;;   BRONCHIAL WASHINGS  09/11/2020   Procedure: BRONCHIAL WASHINGS;  Surgeon: Josephine Igo, DO;  Location: [facility] ENDOSCOPY;  Service: Pulmonary;;   COLONOSCOPY N/A 07/27/2020   Procedure: COLONOSCOPY;  Surgeon: Iva Boop, MD;  Location: [facility] ENDOSCOPY;  Service: Endoscopy;   Laterality: N/A;   CYSTECTOMY Right    breast   ESOPHAGOGASTRODUODENOSCOPY (EGD) WITH PROPOFOL N/A 07/27/2020   Procedure: ESOPHAGOGASTRODUODENOSCOPY (EGD) WITH PROPOFOL;  Surgeon: Iva Boop, MD;  Location: [facility] ENDOSCOPY;  Service: Endoscopy;  Laterality: N/A;   FIDUCIAL MARKER PLACEMENT  09/11/2020   Procedure: FIDUCIAL MARKER PLACEMENT;  Surgeon: Josephine Igo, DO;  Location: [facility] ENDOSCOPY;  Service: Pulmonary;;   LAPAROSCOPIC RIGHT HEMI COLECTOMY Right 07/31/2020   Procedure: LAPAROSCOPIC ASSISSTED  RIGHT HEMI COLECTOMY;  Surgeon: Manus Rudd, MD;  Location: [facility] OR;  Service: General;  Laterality: Right;   LEFT HEART CATH AND CORONARY ANGIOGRAPHY N/A 01/05/2019   Procedure: LEFT HEART CATH AND CORONARY ANGIOGRAPHY;  Surgeon: Corky Crafts, MD;  Location: [facility] INVASIVE CV LAB;  Service: Cardiovascular;  Laterality: N/A;   SUBMUCOSAL TATTOO INJECTION  07/27/2020   Procedure: SUBMUCOSAL TATTOO INJECTION;  Surgeon: Iva Boop, MD;  Location: [facility] ENDOSCOPY;  Service: Endoscopy;;   VIDEO BRONCHOSCOPY WITH ENDOBRONCHIAL NAVIGATION Left 09/11/2020   Procedure: VIDEO BRONCHOSCOPY WITH ENDOBRONCHIAL NAVIGATION;  Surgeon: Josephine Igo, DO;  Location: [facility] ENDOSCOPY;  Service: Pulmonary;  Laterality: Left;  ION   VIDEO BRONCHOSCOPY WITH RADIAL ENDOBRONCHIAL ULTRASOUND  09/11/2020   Procedure: RADIAL ENDOBRONCHIAL ULTRASOUND;  Surgeon: Josephine Igo, DO;  Location: [facility] ENDOSCOPY;  Service: Pulmonary;;   Family History Family History  Problem Relation Age of Onset   Heart disease Father    Stroke Mother    Clotting disorder Mother    Heart disease Sister    Sleep apnea Sister    Emphysema Sister    Breast cancer Maternal Aunt    Emphysema Paternal Grandmother  Social History Social History   Tobacco Use   Smoking status: Former    Packs/day: 1.00    Years: 40.00    Additional pack years: 0.00    Total pack years: 40.00    Types: Cigarettes    Quit date: 02/03/2001    Years  since quitting: 21.3   Smokeless tobacco: Never  Vaping Use   Vaping Use: Never used  Substance Use Topics   Alcohol use: Yes    Alcohol/week: 2.0 standard drinks of alcohol    Types: 2 Standard drinks or equivalent per week    Comment: 2 drinks daily   Drug use: No   Allergies Other and Oxycodone  Review of Systems Review of Systems  Constitutional:  Negative for activity change and fever.  HENT:  Negative for facial swelling and trouble swallowing.   Eyes:  Negative for discharge and redness.  Respiratory:  Negative for cough and shortness of breath.   Cardiovascular:  Positive for chest pain. Negative for palpitations.  Gastrointestinal:  Negative for abdominal pain and nausea.  Genitourinary:  Negative for dysuria and flank pain.  Musculoskeletal:  Negative for back pain and gait problem.  Skin:  Negative for pallor and rash.  Neurological:  Negative for syncope and headaches.    Physical Exam Vital Signs  I have reviewed the triage vital signs BP 121/71   Pulse 97   Temp 99.5 F (37.5 C)   Resp 17   Ht 5' (1.524 m)   Wt 46.3 kg   SpO2 98%   BMI 19.92 kg/m  Physical Exam Vitals and nursing note reviewed.  Constitutional:      General: She is not in acute distress.    Appearance: Normal appearance.  HENT:     Head: Normocephalic and atraumatic.     Right Ear: External ear normal.     Left Ear: External ear normal.     Nose: Nose normal.     Mouth/Throat:     Mouth: Mucous membranes are moist.  Eyes:     General: No scleral icterus.       Right eye: No discharge.        Left eye: No discharge.  Cardiovascular:     Rate and Rhythm: Normal rate and regular rhythm.     Pulses: Normal pulses.     Heart sounds: Normal heart sounds.  Pulmonary:     Effort: Pulmonary effort is normal. No respiratory distress.     Breath sounds: Normal breath sounds.  Abdominal:     General: Abdomen is flat.     Tenderness: There is no abdominal tenderness.   Musculoskeletal:        General: Normal range of motion.     Cervical back: No rigidity.     Right lower leg: No edema.     Left lower leg: No edema.  Skin:    General: Skin is warm and dry.     Capillary Refill: Capillary refill takes less than 2 seconds.  Neurological:     Mental Status: She is alert.  Psychiatric:        Mood and Affect: Mood normal.        Behavior: Behavior normal.     ED Results and Treatments Labs (all labs ordered are listed, but only abnormal results are displayed) Labs Reviewed  CBC WITH DIFFERENTIAL/PLATELET - Abnormal; Notable for the following components:      Result Value   WBC 15.5 (*)    RBC 3.46 (*)  Hemoglobin 10.9 (*)    HCT 35.0 (*)    MCV 101.2 (*)    Neutro Abs 13.4 (*)    Lymphs Abs 0.5 (*)    Abs Immature Granulocytes 0.09 (*)    All other components within normal limits  BASIC METABOLIC PANEL - Abnormal; Notable for the following components:   Chloride 95 (*)    Glucose, Bld 127 (*)    All other components within normal limits  BRAIN NATRIURETIC PEPTIDE  TROPONIN I (HIGH SENSITIVITY)  TROPONIN I (HIGH SENSITIVITY)                                                                                                                          Radiology DG Chest Port 1 View  Result Date: 06/20/2022 CLINICAL DATA:  Chest pain EXAM: PORTABLE CHEST 1 VIEW COMPARISON:  CXR 06/15/22 FINDINGS: No pleural effusion. No pneumothorax. No focal airspace opacity. No radiographically apparent displaced rib fractures. Visualized upper abdomen is unremarkable. Normal cardiac and mediastinal contours. IMPRESSION: No acute abnormality. Electronically Signed   By: Lorenza Cambridge M.D.   On: 06/20/2022 13:23    Pertinent labs & imaging results that were available during my care of the patient were reviewed by me and considered in my medical decision making (see MDM for details).  Medications Ordered in ED Medications - No data to display                                                                                                                                    Procedures Procedures  (including critical care time)  Medical Decision Making / ED Course    Medical Decision Making:    Liliahna Orendain is a 77 y.o. female with past medical history as below, significant for AF, colon cancer, emphysema, COPD, CKD who presents to the ED with complaint of cp following radiation.. The complaint involves an extensive differential diagnosis and also carries with it a high risk of complications and morbidity.  Serious etiology was considered. Ddx includes but is not limited to: Differential includes all life-threatening causes for chest pain. This includes but is not exclusive to acute coronary syndrome, aortic dissection, pulmonary embolism, cardiac tamponade, community-acquired pneumonia, pericarditis, musculoskeletal chest wall pain, etc.   Complete initial physical exam performed, notably the patient  was nad, asymptomatic.    Reviewed and confirmed nursing documentation for past medical history,  family history, social history.  Vital signs reviewed.   Patient here with chest pain following radiation procedure for her lung cancer.  Her pain has since resolved.  She is breathing comfortably on her supplemental oxygen.  No fevers, chills, nausea or vomiting, she is tolerating p.o. without difficulty.  Reports she is actually feeling better now than she did prior to going to her radiation treatment this morning.  Her troponin was negative x 2, EKG nonischemic, labs stable other than elevated leukocytosis, patient secondary to stress response from recent radiation therapy.  She is afebrile, she is not septic.  No coughing, URI symptoms, urination changes or rashes.  PE seems unlikely given lack of respiratory complaints or ongoing chest pain.  Legrand Rams that her chest pain is likely secondary to radiation treatment, does not appear to be consistent with  ACS.  Recommend she follow-up with oncology and with her primary care doctor for further evaluation.  Discussed with family at length at bedside.  Patient stable for discharge back to SNF    The patient improved significantly and was discharged in stable condition. Detailed discussions were had with the patient regarding current findings, and need for close f/u with PCP or on call doctor. The patient has been instructed to return immediately if the symptoms worsen in any way for re-evaluation. Patient verbalized understanding and is in agreement with current care plan. All questions answered prior to discharge.         Additional history obtained: -Additional history obtained from family -External records from outside source obtained and reviewed including: Chart review including previous notes, labs, imaging, consultation notes including prior admission, prior labs and imaging, medications   Lab Tests: -I ordered, reviewed, and interpreted labs.   The pertinent results include:   Labs Reviewed  CBC WITH DIFFERENTIAL/PLATELET - Abnormal; Notable for the following components:      Result Value   WBC 15.5 (*)    RBC 3.46 (*)    Hemoglobin 10.9 (*)    HCT 35.0 (*)    MCV 101.2 (*)    Neutro Abs 13.4 (*)    Lymphs Abs 0.5 (*)    Abs Immature Granulocytes 0.09 (*)    All other components within normal limits  BASIC METABOLIC PANEL - Abnormal; Notable for the following components:   Chloride 95 (*)    Glucose, Bld 127 (*)    All other components within normal limits  BRAIN NATRIURETIC PEPTIDE  TROPONIN I (HIGH SENSITIVITY)  TROPONIN I (HIGH SENSITIVITY)    Notable for leukocytosis   EKG   EKG Interpretation  Date/Time:    Ventricular Rate:    PR Interval:    QRS Duration:   QT Interval:    QTC Calculation:   R Axis:     Text Interpretation:           Imaging Studies ordered: I ordered imaging studies including cxr I independently visualized the following imaging  with scope of interpretation limited to determining acute life threatening conditions related to emergency care; findings noted above, significant for no acute abn I independently visualized and interpreted imaging. I agree with the radiologist interpretation   Medicines ordered and prescription drug management: No orders of the defined types were placed in this encounter.   -I have reviewed the patients home medicines and have made adjustments as needed   Consultations Obtained: na   Cardiac Monitoring: The patient was maintained on a cardiac monitor.  I personally viewed and interpreted the cardiac monitored  which showed an underlying rhythm of: nsr  Social Determinants of Health:  Diagnosis or treatment significantly limited by social determinants of health: former smoker   Reevaluation: After the interventions noted above, I reevaluated the patient and found that they have resolved  Co morbidities that complicate the patient evaluation  Past Medical History:  Diagnosis Date   Abnormal nuclear stress test    AF (paroxysmal atrial fibrillation) (HCC)    Age-related osteoporosis without current pathological fracture 05/16/2016   Alcoholism (HCC) 10/21/2018   Formatting of this note might be different from the original. 2 glasses a day.   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 Wrist surgery   Benign paroxysmal positional vertigo due to bilateral vestibular disorder 02/09/2020   Cancer of ascending colon [facility])    Carcinoma of ascending colon (HCC)    Cat allergies    Centrilobular emphysema (HCC) 05/16/2016   Chronic anemia 04/09/2021   Chronic atrial fibrillation, unspecified (HCC) 10/04/2020   Chronic obstructive pulmonary disease (HCC) 04/09/2021   Chronic pain 04/09/2021   CKD (chronic kidney disease)  stage 3, GFR 30-59 ml/min (HCC) 01/04/2018   COPD (chronic obstructive pulmonary disease) (HCC)    Cysts of both ovaries 11/24/2019   Formatting of this note might be different from the original. On CT 11/2019. Referred to GYN.   Dyspnea    Early satiety 04/14/2019   Essential hypertension 08/22/2010   Formatting of this note might be different from the original. Formatting of this note might be different from the original. D/C ACE 08/22/2010 due to concerns with freq aecopd/ pseudoasthma > resolved  Last Assessment & Plan:  Formatting of this note might be different from the original. Adequate control on present rx, reviewed - note she's no longer falling apart during her aecopd's which are less   Gastro-esophageal reflux disease without esophagitis 07/14/2017   Gastrointestinal hemorrhage    History of pulmonary embolus (PE) 07/14/2017   History of radiation therapy    Left lung- SBRT 11/13/20-11/27/20- Dr. 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 Possible???? See CT comments 11/2019.   Other vitamin B12 deficiency anemias 04/09/2021   Paroxysmal atrial fibrillation (HCC) 04/14/2019   Formatting of this note  might be different from the original. [facility].   Prediabetes 07/14/2017   Pulmonary embolism (HCC) 2017   Rheumatoid arthritis (HCC) 09/20/2010   Dr Nickola Major in [place].   Rheumatoid arthritis(714.0)    Dr Azzie Roup follows   Schatzki's ring    Senile purpura (HCC) 01/04/2018   Skin rash 07/14/2017  Bx'd Allergic. Likely meds.   Status post thoracentesis    Symptomatic anemia 07/25/2020   Type 2 diabetes mellitus without complications (HCC) 04/09/2021   Vitamin B12 deficiency 07/14/2017   Vitamin D deficiency 04/09/2021      Dispostion: Disposition decision including need for hospitalization was considered, and patient discharged from emergency department.    Final Clinical Impression(s) / ED Diagnoses Final diagnoses:  Chest pain, unspecified type     This chart was dictated using voice recognition software.  Despite best efforts to proofread,  errors can occur which can change the documentation meaning.    Sloan Leiter, DO 06/20/22 1627

## 2022-06-20 NOTE — Progress Notes (Addendum)
Patient over to clinic after receiving 5th radiation treatment to bilateral lungs. Patient complains of pain to mid chest that feels like pressure. Patients blood pressure noted to be 132/117. Patient noted to have shortness of breath between breaths. O2 sats 92% on 3 liters via [place]. Per Ashlyn PA patient to ED for evaluation.  CN notified,patient taken to [facility] BP (!) 132/117   Pulse 89   Temp (!) 96.1 F (35.6 C)   Resp (!) 22   SpO2 92% Comment: on 3 liters .

## 2022-06-20 NOTE — Discharge Instructions (Addendum)
It was a pleasure caring for you today in the emergency department.  Please follow-up with your oncologist in regards to radiation treatment on Monday.  Please follow-up primary care doctor  Please return to the emergency department for any worsening or worrisome symptoms.

## 2022-06-23 ENCOUNTER — Ambulatory Visit
Admission: RE | Admit: 2022-06-23 | Discharge: 2022-06-23 | Disposition: A | Discharge status: Home / Self Care | Payer: Medicare PPO | Source: Ambulatory Visit | Attending: Radiation Oncology | Admitting: Radiation Oncology

## 2022-06-23 ENCOUNTER — Ambulatory Visit: Payer: Medicare PPO | Admitting: Radiation Oncology

## 2022-06-23 ENCOUNTER — Other Ambulatory Visit: Payer: Self-pay

## 2022-06-23 ENCOUNTER — Telehealth: Payer: Self-pay | Admitting: Radiation Oncology

## 2022-06-23 DIAGNOSIS — R918 Other nonspecific abnormal finding of lung field: Secondary | ICD-10-CM

## 2022-06-23 DIAGNOSIS — C3492 Malignant neoplasm of unspecified part of left bronchus or lung: Secondary | ICD-10-CM | POA: Diagnosis not present

## 2022-06-23 LAB — RAD ONC ARIA SESSION SUMMARY
Course Elapsed Days: 7
Plan Fractions Treated to Date: 3
Plan Fractions Treated to Date: 6
Plan Prescribed Dose Per Fraction: 10 Gy
Plan Prescribed Dose Per Fraction: 5 Gy
Plan Total Fractions Prescribed: 10
Plan Total Fractions Prescribed: 5
Plan Total Prescribed Dose: 50 Gy
Plan Total Prescribed Dose: 50 Gy
Reference Point Dosage Given to Date: 30 Gy
Reference Point Dosage Given to Date: 30 Gy
Reference Point Session Dosage Given: 10 Gy
Reference Point Session Dosage Given: 5 Gy
Session Number: 6

## 2022-06-23 NOTE — Telephone Encounter (Signed)
5/20 @ 12:21 pm patient's daughter came to window, before taking patient to L1 machine for her treatments.  She wanted someone to know, patient is complaining about right side pain.  Called nursing spoke to [place] so they are aware.

## 2022-06-24 ENCOUNTER — Other Ambulatory Visit: Payer: Self-pay

## 2022-06-24 ENCOUNTER — Ambulatory Visit
Admission: RE | Admit: 2022-06-24 | Discharge: 2022-06-24 | Disposition: A | Discharge status: Home / Self Care | Payer: Medicare PPO | Source: Ambulatory Visit | Attending: Radiation Oncology | Admitting: Radiation Oncology

## 2022-06-24 ENCOUNTER — Encounter: Payer: Self-pay | Admitting: Radiation Oncology

## 2022-06-24 ENCOUNTER — Ambulatory Visit: Payer: Medicare PPO

## 2022-06-24 DIAGNOSIS — C3492 Malignant neoplasm of unspecified part of left bronchus or lung: Secondary | ICD-10-CM | POA: Diagnosis not present

## 2022-06-24 LAB — RAD ONC ARIA SESSION SUMMARY
Course Elapsed Days: 8
Plan Fractions Treated to Date: 7
Plan Prescribed Dose Per Fraction: 5 Gy
Plan Total Fractions Prescribed: 10
Plan Total Prescribed Dose: 50 Gy
Reference Point Dosage Given to Date: 35 Gy
Reference Point Session Dosage Given: 5 Gy
Session Number: 7

## 2022-06-25 ENCOUNTER — Other Ambulatory Visit: Payer: Self-pay

## 2022-06-25 ENCOUNTER — Ambulatory Visit
Admission: RE | Admit: 2022-06-25 | Discharge: 2022-06-25 | Disposition: A | Discharge status: Home / Self Care | Payer: Medicare PPO | Source: Ambulatory Visit | Attending: Radiation Oncology | Admitting: Radiation Oncology

## 2022-06-25 DIAGNOSIS — C3432 Malignant neoplasm of lower lobe, left bronchus or lung: Secondary | ICD-10-CM

## 2022-06-25 DIAGNOSIS — C3492 Malignant neoplasm of unspecified part of left bronchus or lung: Secondary | ICD-10-CM | POA: Diagnosis not present

## 2022-06-25 LAB — RAD ONC ARIA SESSION SUMMARY
Course Elapsed Days: 9
Plan Fractions Treated to Date: 4
Plan Fractions Treated to Date: 8
Plan Prescribed Dose Per Fraction: 10 Gy
Plan Prescribed Dose Per Fraction: 5 Gy
Plan Total Fractions Prescribed: 10
Plan Total Fractions Prescribed: 5
Plan Total Prescribed Dose: 50 Gy
Plan Total Prescribed Dose: 50 Gy
Reference Point Dosage Given to Date: 40 Gy
Reference Point Dosage Given to Date: 40 Gy
Reference Point Session Dosage Given: 10 Gy
Reference Point Session Dosage Given: 5 Gy
Session Number: 8

## 2022-06-26 ENCOUNTER — Ambulatory Visit
Admission: RE | Admit: 2022-06-26 | Discharge: 2022-06-26 | Disposition: A | Discharge status: Home / Self Care | Payer: Medicare PPO | Source: Ambulatory Visit | Attending: Radiation Oncology | Admitting: Radiation Oncology

## 2022-06-26 ENCOUNTER — Other Ambulatory Visit: Payer: Self-pay

## 2022-06-26 DIAGNOSIS — C3492 Malignant neoplasm of unspecified part of left bronchus or lung: Secondary | ICD-10-CM | POA: Diagnosis not present

## 2022-06-26 LAB — RAD ONC ARIA SESSION SUMMARY
Course Elapsed Days: 10
Plan Fractions Treated to Date: 9
Plan Prescribed Dose Per Fraction: 5 Gy
Plan Total Fractions Prescribed: 10
Plan Total Prescribed Dose: 50 Gy
Reference Point Dosage Given to Date: 45 Gy
Reference Point Session Dosage Given: 5 Gy
Session Number: 9

## 2022-06-27 ENCOUNTER — Other Ambulatory Visit: Payer: Self-pay

## 2022-06-27 ENCOUNTER — Ambulatory Visit
Admission: RE | Admit: 2022-06-27 | Discharge: 2022-06-27 | Disposition: A | Discharge status: Home / Self Care | Payer: Medicare PPO | Source: Ambulatory Visit | Attending: Radiation Oncology | Admitting: Radiation Oncology

## 2022-06-27 DIAGNOSIS — C3492 Malignant neoplasm of unspecified part of left bronchus or lung: Secondary | ICD-10-CM | POA: Diagnosis not present

## 2022-06-27 LAB — RAD ONC ARIA SESSION SUMMARY
Course Elapsed Days: 11
Plan Fractions Treated to Date: 10
Plan Fractions Treated to Date: 5
Plan Prescribed Dose Per Fraction: 10 Gy
Plan Prescribed Dose Per Fraction: 5 Gy
Plan Total Fractions Prescribed: 10
Plan Total Fractions Prescribed: 5
Plan Total Prescribed Dose: 50 Gy
Plan Total Prescribed Dose: 50 Gy
Reference Point Dosage Given to Date: 50 Gy
Reference Point Dosage Given to Date: 50 Gy
Reference Point Session Dosage Given: 10 Gy
Reference Point Session Dosage Given: 5 Gy
Session Number: 10

## 2022-07-01 ENCOUNTER — Ambulatory Visit: Payer: Medicare PPO | Admitting: Radiation Oncology

## 2022-07-08 NOTE — Radiation Completion Notes (Signed)
Patient Name: Desiree [name], Desiree [name] MRN: [id number] Date of Birth: Aug 26, 1945 Referring Physician: Rennis Harding, M.D. Date of Service: 2022-07-08 Radiation Oncologist: Arnette Schaumann, M.D. [facility] [place]                             RADIATION ONCOLOGY END OF TREATMENT NOTE     Diagnosis: C34.32 Malignant neoplasm of lower lobe, left bronchus or lung; C34.31 Malignant neoplasm of lower lobe, right bronchus or lung Staging on 2020-08-23: Cancer of ascending colon (HCC) T=pT2, N=pN1a, M=cM0 Staging on 2020-09-11: Lung cancer, lower lobe (HCC) T=cT1b, N=cN0, M=cM0 Intent: Curative     ==========DELIVERED PLANS==========  First Treatment Date: 2022-06-16 - Last Treatment Date: 2022-06-27   Plan Name: Lung_L_UHRT Site: Lung, Left Technique: IMRT Mode: Photon Dose Per Fraction: 5 Gy Prescribed Dose (Delivered / Prescribed): 50 Gy / 50 Gy Prescribed Fxs (Delivered / Prescribed): 10 / 10   Plan Name: Lung_R_SBRT Site: Lung, Right Technique: SBRT/SRT-IMRT Mode: Photon Dose Per Fraction: 10 Gy Prescribed Dose (Delivered / Prescribed): 50 Gy / 50 Gy Prescribed Fxs (Delivered / Prescribed): 5 / 5     ==========ON TREATMENT VISIT DATES========== 2022-06-17, 2022-06-17, 2022-06-19, 2022-06-23, 2022-06-23, 2022-06-25, 2022-06-27     ==========UPCOMING VISITS==========       ==========APPENDIX - ON TREATMENT VISIT NOTES==========   See weekly On Treatment Notes in Epic for details.

## 2022-07-28 ENCOUNTER — Telehealth: Payer: Self-pay | Admitting: Oncology

## 2022-07-28 NOTE — Telephone Encounter (Signed)
CT Chest has been scheduled for 08/19/22 @ 11; Checking in @ 10 am   [facility] has been notified of date,time and instructions.

## 2022-07-31 ENCOUNTER — Ambulatory Visit: Payer: Self-pay | Admitting: Radiation Oncology

## 2022-08-01 ENCOUNTER — Encounter: Payer: Self-pay | Admitting: Radiation Oncology

## 2022-08-03 NOTE — Progress Notes (Signed)
Radiation Oncology         [phone]) [phone] ________________________________  Name: Desiree [name] MRN: [id number]  Date: 08/04/2022  DOB: August 14, 1945  Follow-Up Visit Note  CC: Patient, No Pcp Per  Melvyn Neth, Dequincy A, MD  No diagnosis found.  Diagnosis: The primary encounter diagnosis was Malignant neoplasm of lower lobe of left lung (HCC). A diagnosis of Cancer of ascending colon [facility]) was also pertinent to this visit.   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 The patient tolerated radiation treatment relatively well. During her final weekly treatment check on 06/23/22, the patient endorsed some discomfort along the right lower anterior chest region. Physical exam performed on that same date noted some tenderness along the right lower anterior lateral rib cage area. This might have been related to her prior history of rib fractures.         At the start of radiation therapy, the patient was hospitalized from 06/15/22 through 06/19/22 for management of acute UTI/metabolic encephalopathy. She was sent to the ED from her assisted living facility with weakness, lethargy, and increased confusion (patient has dementia). Initial work-up included urinalysis which came back consistent with UTI.  ED course included IV fluids and IV antibiotics and she was admitted for further management. Her urine culture eventually came back showing E.  coli.  She was evaluated by PT/OT who recommended discharge to SNF.          The patient also presented to the ED on 06/20/22 for evaluation of chest pain following her radiation treatment that day. Her chest pain lasted for approximately 30-60 minutes and resolved prior to ED evaluation. In light of unremarkable ED work-up (including a chest x-ray) and resolution of her pain, her symptoms were attributed to radiation therapy and she was discharged in stable condition.   ***  Allergies:  is allergic to other and oxycodone.  Meds: Current Outpatient Medications  Medication Sig Dispense Refill   acetaminophen (TYLENOL) 325 MG tablet Take 325 mg by mouth every 6 (six) hours as needed for moderate pain or headache.     albuterol (VENTOLIN HFA) 108 (90 Base) MCG/ACT inhaler Inhale 2 puffs into the lungs every 4 (four) hours as needed for shortness of breath (shortness of breath (related to COPD)).     amLODipine (NORVASC) 5 MG tablet Take 1 tablet (5 mg total) by mouth daily. Patient needs appointment for further refills. 1 st attempt 30 tablet 0   aspirin EC 81 MG tablet Take 81 mg by mouth daily.     atorvastatin (LIPITOR) 20 MG tablet Take 1 tablet (20 mg total) by mouth daily. Patient needs to keep appointment for further refills. 1 st attempt (Patient taking differently: Take 20 mg by mouth every evening. Patient needs to keep appointment for further refills. 1 st attempt) 30 tablet 0   Budeson-Glycopyrrol-Formoterol (BREZTRI AEROSPHERE) 160-9-4.8 MCG/ACT  AERO Inhale 2 puffs into the lungs 2 (two) times daily. 10.7 g 4   carvedilol (COREG) 3.125 MG tablet Take 1 tablet (3.125 mg total) by mouth 2 (two) times daily with a meal. 60 tablet 0   Cholecalciferol (D3-1000) 25 MCG (1000 UT) capsule Take 1,000 Units by mouth daily.     Ferrous Sulfate (IRON) 325 (65 Fe) MG TABS Take 1 tablet by mouth daily.     fexofenadine (ALLEGRA) 180 MG tablet Take 180 mg by mouth daily.     gabapentin (NEURONTIN)  100 MG capsule Take 100 mg by mouth 2 (two) times daily.     hydroxychloroquine (PLAQUENIL) 200 MG tablet Take 200 mg by mouth daily.  3   leflunomide (ARAVA) 10 MG tablet Take 10 mg by mouth daily.     lidocaine 4 % Place 1 patch onto the skin daily.     loperamide (IMODIUM A-D) 2 MG tablet Take 2 mg by mouth as needed for diarrhea or loose stools (MAX 10 PILLS/24 HRS.).     meclizine (ANTIVERT) 12.5 MG tablet Take 12.5 mg by mouth 2 (two) times daily as needed for dizziness.     mirtazapine (REMERON) 7.5 MG tablet Take 7.5 mg by mouth at bedtime.     montelukast (SINGULAIR) 10 MG tablet Take 1 tablet (10 mg total) by mouth at bedtime. 90 tablet 1   omeprazole (PRILOSEC) 20 MG capsule Take 1 capsule (20 mg total) by mouth daily. 90 capsule 1   ondansetron (ZOFRAN-ODT) 4 MG disintegrating tablet Take 1 tablet (4 mg total) by mouth every 8 (eight) hours as needed for nausea or vomiting. 10 tablet 0   OYSTERCAL-D 500-10 MG-MCG TABS Take 1 tablet by mouth daily.     predniSONE (DELTASONE) 5 MG tablet Take 5 mg by mouth daily with breakfast.     Sertraline HCl 150 MG CAPS Take 1 capsule by mouth daily.     tiZANidine (ZANAFLEX) 2 MG tablet Take 2 mg by mouth 2 (two) times daily.     traMADol (ULTRAM) 50 MG tablet Take 1 tablet (50 mg total) by mouth 3 (three) times daily as needed for moderate pain (pain). 15 tablet 0   valsartan (DIOVAN) 320 MG tablet Take 320 mg by mouth in the morning.     vitamin B-12 (CYANOCOBALAMIN) 1000 MCG tablet Take 1,000 mcg by mouth daily.      No current facility-administered medications for this encounter.    Physical Findings: The patient is in no acute distress. Patient is alert and oriented.  vitals were not taken for this visit. .  No significant changes. Lungs are clear to auscultation bilaterally. Heart has regular rate and rhythm. No palpable cervical, supraclavicular, or axillary adenopathy. Abdomen soft, non-tender, normal bowel sounds.   Lab  Findings: Lab Results  Component Value Date   WBC 15.5 (H) 06/20/2022   HGB 10.9 (L) 06/20/2022   HCT 35.0 (L) 06/20/2022   MCV 101.2 (H) 06/20/2022   PLT 219 06/20/2022    Radiographic Findings: No results found.  Impression:  The primary encounter diagnosis was Malignant neoplasm of lower lobe of left lung (HCC). A diagnosis of Cancer of ascending colon [facility]) was also pertinent to this visit.   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  ***  Plan:  ***   *** minutes of total time was spent for this patient encounter, including preparation, face-to-face counseling with the patient and coordination of care, physical exam, and documentation of the encounter. ____________________________________  Billie Lade, PhD, MD  This document serves as a record of services personally performed by Antony Blackbird, MD. It was created on his behalf by Neena Rhymes, a trained medical scribe. The creation of this record is based on the scribe's personal observations and the provider's statements to them. This document has been checked and approved by the attending provider.

## 2022-08-03 NOTE — Progress Notes (Signed)
  Radiation Oncology         [phone]) [phone] ________________________________  Name: Desiree [name] MRN: [id number]  Date: 08/04/2022  DOB: 1945-03-10  End of Treatment Note  Diagnosis: The primary encounter diagnosis was Malignant neoplasm of lower lobe of left lung (HCC). A diagnosis of Cancer of ascending colon [facility]) was also pertinent to this visit.   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 During her final weekly treatment check on 06/23/22, the patient endorsed some discomfort along the right lower anterior chest region. Physical exam performed on that same date noted some tenderness along the right lower anterior lateral rib cage area. This might have been related to her prior history of rib fractures.  Plan: The patient has completed radiation treatment. The patient will return to radiation oncology clinic for routine followup in one month. I advised them to call or return sooner if they have any questions or concerns related to their recovery or treatment.  -----------------------------------  Billie Lade, PhD, MD  This document serves as a record of services personally performed by Antony Blackbird, MD. It was created on his behalf by Neena Rhymes, a trained medical scribe. The creation of this record is based on the scribe's personal observations and the provider's statements to them. This document has been checked and approved by the attending  provider.

## 2022-08-04 ENCOUNTER — Ambulatory Visit
Admission: RE | Admit: 2022-08-04 | Discharge: 2022-08-04 | Disposition: A | Discharge status: Home / Self Care | Payer: Medicare PPO | Source: Ambulatory Visit | Attending: Radiation Oncology | Admitting: Radiation Oncology

## 2022-08-04 ENCOUNTER — Encounter: Payer: Self-pay | Admitting: Radiation Oncology

## 2022-08-04 ENCOUNTER — Other Ambulatory Visit: Payer: Self-pay

## 2022-08-04 VITALS — BP 149/75 | HR 94 | Temp 97.7°F | Resp 24 | Ht 60.0 in | Wt 102.0 lb

## 2022-08-04 DIAGNOSIS — C3432 Malignant neoplasm of lower lobe, left bronchus or lung: Secondary | ICD-10-CM | POA: Insufficient documentation

## 2022-08-04 DIAGNOSIS — Z923 Personal history of irradiation: Secondary | ICD-10-CM | POA: Insufficient documentation

## 2022-08-04 DIAGNOSIS — C3430 Malignant neoplasm of lower lobe, unspecified bronchus or lung: Secondary | ICD-10-CM

## 2022-08-04 DIAGNOSIS — C182 Malignant neoplasm of ascending colon: Secondary | ICD-10-CM | POA: Insufficient documentation

## 2022-08-04 NOTE — Progress Notes (Signed)
Lucianne Kotlowski is here today for follow up post radiation to the lung.  Lung Side: right and left lung 06-16-22 to 06/27/22  Does the patient complain of any of the following: Pain:No Shortness of breath w/wo exertion: With activity and at rest Cough: none Hemoptysis: none Pain with swallowing: none Swallowing/choking concerns: none Appetite: good Energy Level: good Post radiation skin Changes: no issues    Additional comments if applicable: Vitals:   08/04/22 1154  BP: (!) 149/75  Pulse: 94  Resp: (!) 24  Temp: 97.7 F (36.5 C)  SpO2: 100%  Weight: 46.3 kg  Height: 5' (1.524 m)  PF: (!) 3 L/min

## 2022-08-19 ENCOUNTER — Encounter (HOSPITAL_COMMUNITY): Payer: Self-pay

## 2022-08-19 ENCOUNTER — Inpatient Hospital Stay (HOSPITAL_COMMUNITY)
Admission: EM | Admit: 2022-08-19 | Discharge: 2022-08-22 | DRG: 194 | Disposition: A | Discharge status: Home / Self Care | Payer: Medicare PPO | Attending: Internal Medicine | Admitting: Internal Medicine

## 2022-08-19 ENCOUNTER — Emergency Department (HOSPITAL_COMMUNITY): Payer: Medicare PPO

## 2022-08-19 DIAGNOSIS — Z823 Family history of stroke: Secondary | ICD-10-CM

## 2022-08-19 DIAGNOSIS — E538 Deficiency of other specified B group vitamins: Secondary | ICD-10-CM | POA: Diagnosis present

## 2022-08-19 DIAGNOSIS — Z825 Family history of asthma and other chronic lower respiratory diseases: Secondary | ICD-10-CM

## 2022-08-19 DIAGNOSIS — Z9049 Acquired absence of other specified parts of digestive tract: Secondary | ICD-10-CM

## 2022-08-19 DIAGNOSIS — J189 Pneumonia, unspecified organism: Principal | ICD-10-CM | POA: Diagnosis present

## 2022-08-19 DIAGNOSIS — N183 Chronic kidney disease, stage 3 unspecified: Secondary | ICD-10-CM | POA: Diagnosis present

## 2022-08-19 DIAGNOSIS — Z7952 Long term (current) use of systemic steroids: Secondary | ICD-10-CM

## 2022-08-19 DIAGNOSIS — E782 Mixed hyperlipidemia: Secondary | ICD-10-CM | POA: Diagnosis present

## 2022-08-19 DIAGNOSIS — E1142 Type 2 diabetes mellitus with diabetic polyneuropathy: Secondary | ICD-10-CM | POA: Diagnosis present

## 2022-08-19 DIAGNOSIS — N179 Acute kidney failure, unspecified: Secondary | ICD-10-CM | POA: Diagnosis present

## 2022-08-19 DIAGNOSIS — Z923 Personal history of irradiation: Secondary | ICD-10-CM

## 2022-08-19 DIAGNOSIS — Z1152 Encounter for screening for COVID-19: Secondary | ICD-10-CM

## 2022-08-19 DIAGNOSIS — Z86711 Personal history of pulmonary embolism: Secondary | ICD-10-CM

## 2022-08-19 DIAGNOSIS — Z9981 Dependence on supplemental oxygen: Secondary | ICD-10-CM

## 2022-08-19 DIAGNOSIS — Z885 Allergy status to narcotic agent status: Secondary | ICD-10-CM

## 2022-08-19 DIAGNOSIS — I2510 Atherosclerotic heart disease of native coronary artery without angina pectoris: Secondary | ICD-10-CM | POA: Diagnosis present

## 2022-08-19 DIAGNOSIS — R7303 Prediabetes: Secondary | ICD-10-CM | POA: Diagnosis present

## 2022-08-19 DIAGNOSIS — Z8249 Family history of ischemic heart disease and other diseases of the circulatory system: Secondary | ICD-10-CM

## 2022-08-19 DIAGNOSIS — M810 Age-related osteoporosis without current pathological fracture: Secondary | ICD-10-CM | POA: Diagnosis present

## 2022-08-19 DIAGNOSIS — I10 Essential (primary) hypertension: Secondary | ICD-10-CM | POA: Diagnosis present

## 2022-08-19 DIAGNOSIS — Z832 Family history of diseases of the blood and blood-forming organs and certain disorders involving the immune mechanism: Secondary | ICD-10-CM

## 2022-08-19 DIAGNOSIS — Z87891 Personal history of nicotine dependence: Secondary | ICD-10-CM

## 2022-08-19 DIAGNOSIS — E039 Hypothyroidism, unspecified: Secondary | ICD-10-CM | POA: Diagnosis present

## 2022-08-19 DIAGNOSIS — D500 Iron deficiency anemia secondary to blood loss (chronic): Secondary | ICD-10-CM | POA: Diagnosis present

## 2022-08-19 DIAGNOSIS — C3430 Malignant neoplasm of lower lobe, unspecified bronchus or lung: Secondary | ICD-10-CM | POA: Diagnosis present

## 2022-08-19 DIAGNOSIS — J9809 Other diseases of bronchus, not elsewhere classified: Secondary | ICD-10-CM | POA: Diagnosis present

## 2022-08-19 DIAGNOSIS — Z79899 Other long term (current) drug therapy: Secondary | ICD-10-CM

## 2022-08-19 DIAGNOSIS — C3492 Malignant neoplasm of unspecified part of left bronchus or lung: Secondary | ICD-10-CM | POA: Diagnosis present

## 2022-08-19 DIAGNOSIS — Z85038 Personal history of other malignant neoplasm of large intestine: Secondary | ICD-10-CM

## 2022-08-19 DIAGNOSIS — G609 Hereditary and idiopathic neuropathy, unspecified: Secondary | ICD-10-CM | POA: Diagnosis present

## 2022-08-19 DIAGNOSIS — J9611 Chronic respiratory failure with hypoxia: Secondary | ICD-10-CM | POA: Diagnosis present

## 2022-08-19 DIAGNOSIS — E119 Type 2 diabetes mellitus without complications: Secondary | ICD-10-CM | POA: Diagnosis present

## 2022-08-19 DIAGNOSIS — Z803 Family history of malignant neoplasm of breast: Secondary | ICD-10-CM

## 2022-08-19 DIAGNOSIS — J432 Centrilobular emphysema: Secondary | ICD-10-CM | POA: Diagnosis present

## 2022-08-19 DIAGNOSIS — J440 Chronic obstructive pulmonary disease with acute lower respiratory infection: Secondary | ICD-10-CM | POA: Diagnosis present

## 2022-08-19 DIAGNOSIS — K219 Gastro-esophageal reflux disease without esophagitis: Secondary | ICD-10-CM | POA: Diagnosis present

## 2022-08-19 DIAGNOSIS — M069 Rheumatoid arthritis, unspecified: Secondary | ICD-10-CM | POA: Diagnosis present

## 2022-08-19 DIAGNOSIS — Z7982 Long term (current) use of aspirin: Secondary | ICD-10-CM

## 2022-08-19 DIAGNOSIS — I480 Paroxysmal atrial fibrillation: Secondary | ICD-10-CM | POA: Diagnosis present

## 2022-08-19 NOTE — ED Triage Notes (Signed)
Pt arrived from [facility] BIB [facility] for productive cough for one week with SOB. Pt wears 3L [place] daily, pt has no complaints, but facility wanted pt to come to be evaluated. Pt does have hx fo lung cancer and COPD, former smoker. NAD noted, A&O x4.

## 2022-08-20 ENCOUNTER — Emergency Department (HOSPITAL_COMMUNITY): Payer: Medicare PPO

## 2022-08-20 ENCOUNTER — Inpatient Hospital Stay: Payer: Medicare PPO | Admitting: Oncology

## 2022-08-20 ENCOUNTER — Encounter (HOSPITAL_COMMUNITY): Payer: Self-pay

## 2022-08-20 DIAGNOSIS — J189 Pneumonia, unspecified organism: Secondary | ICD-10-CM

## 2022-08-20 DIAGNOSIS — Z79899 Other long term (current) drug therapy: Secondary | ICD-10-CM | POA: Diagnosis not present

## 2022-08-20 DIAGNOSIS — J432 Centrilobular emphysema: Secondary | ICD-10-CM | POA: Diagnosis present

## 2022-08-20 DIAGNOSIS — J9611 Chronic respiratory failure with hypoxia: Secondary | ICD-10-CM | POA: Diagnosis present

## 2022-08-20 DIAGNOSIS — J9809 Other diseases of bronchus, not elsewhere classified: Secondary | ICD-10-CM | POA: Diagnosis present

## 2022-08-20 DIAGNOSIS — Z923 Personal history of irradiation: Secondary | ICD-10-CM | POA: Diagnosis not present

## 2022-08-20 DIAGNOSIS — Z87891 Personal history of nicotine dependence: Secondary | ICD-10-CM | POA: Diagnosis not present

## 2022-08-20 DIAGNOSIS — J440 Chronic obstructive pulmonary disease with acute lower respiratory infection: Secondary | ICD-10-CM | POA: Diagnosis present

## 2022-08-20 DIAGNOSIS — C3431 Malignant neoplasm of lower lobe, right bronchus or lung: Secondary | ICD-10-CM | POA: Diagnosis not present

## 2022-08-20 DIAGNOSIS — C3492 Malignant neoplasm of unspecified part of left bronchus or lung: Secondary | ICD-10-CM | POA: Diagnosis present

## 2022-08-20 DIAGNOSIS — M069 Rheumatoid arthritis, unspecified: Secondary | ICD-10-CM | POA: Diagnosis present

## 2022-08-20 DIAGNOSIS — E039 Hypothyroidism, unspecified: Secondary | ICD-10-CM | POA: Diagnosis present

## 2022-08-20 DIAGNOSIS — E782 Mixed hyperlipidemia: Secondary | ICD-10-CM | POA: Diagnosis present

## 2022-08-20 DIAGNOSIS — Z8249 Family history of ischemic heart disease and other diseases of the circulatory system: Secondary | ICD-10-CM | POA: Diagnosis not present

## 2022-08-20 DIAGNOSIS — K219 Gastro-esophageal reflux disease without esophagitis: Secondary | ICD-10-CM | POA: Diagnosis present

## 2022-08-20 DIAGNOSIS — N179 Acute kidney failure, unspecified: Secondary | ICD-10-CM | POA: Diagnosis present

## 2022-08-20 DIAGNOSIS — G609 Hereditary and idiopathic neuropathy, unspecified: Secondary | ICD-10-CM | POA: Diagnosis present

## 2022-08-20 DIAGNOSIS — I480 Paroxysmal atrial fibrillation: Secondary | ICD-10-CM | POA: Diagnosis present

## 2022-08-20 DIAGNOSIS — E1142 Type 2 diabetes mellitus with diabetic polyneuropathy: Secondary | ICD-10-CM | POA: Diagnosis present

## 2022-08-20 DIAGNOSIS — Z1152 Encounter for screening for COVID-19: Secondary | ICD-10-CM | POA: Diagnosis not present

## 2022-08-20 DIAGNOSIS — I2510 Atherosclerotic heart disease of native coronary artery without angina pectoris: Secondary | ICD-10-CM | POA: Diagnosis present

## 2022-08-20 DIAGNOSIS — E119 Type 2 diabetes mellitus without complications: Secondary | ICD-10-CM | POA: Diagnosis present

## 2022-08-20 DIAGNOSIS — D500 Iron deficiency anemia secondary to blood loss (chronic): Secondary | ICD-10-CM | POA: Diagnosis present

## 2022-08-20 DIAGNOSIS — I10 Essential (primary) hypertension: Secondary | ICD-10-CM | POA: Diagnosis present

## 2022-08-20 HISTORY — DX: Pneumonia, unspecified organism: J18.9

## 2022-08-20 HISTORY — DX: Hypomagnesemia: E83.42

## 2022-08-20 LAB — BASIC METABOLIC PANEL
Anion gap: 7 (ref 5–15)
BUN: 14 mg/dL (ref 8–23)
CO2: 28 mmol/L (ref 22–32)
Calcium: 8.8 mg/dL — ABNORMAL LOW (ref 8.9–10.3)
Chloride: 98 mmol/L (ref 98–111)
Creatinine, Ser: 0.77 mg/dL (ref 0.44–1.00)
GFR, Estimated: >60 mL/min (ref >60)
Glucose, Bld: 101 mg/dL — ABNORMAL HIGH (ref 70–99)
Potassium: 4.2 mmol/L (ref 3.5–5.1)
Sodium: 133 mmol/L — ABNORMAL LOW (ref 135–145)

## 2022-08-20 LAB — CBC WITH DIFFERENTIAL/PLATELET
Abs Immature Granulocytes: 0.06 10*3/uL (ref 0.00–0.07)
Basophils Absolute: 0.1 10*3/uL (ref 0.0–0.1)
Basophils Relative: 1 %
Eosinophils Absolute: 0.4 10*3/uL (ref 0.0–0.5)
Eosinophils Relative: 3 %
HCT: 31.5 % — ABNORMAL LOW (ref 36.0–46.0)
Hemoglobin: 9.6 g/dL — ABNORMAL LOW (ref 12.0–15.0)
Immature Granulocytes: 1 %
Lymphocytes Relative: 6 %
Lymphs Abs: 0.8 10*3/uL (ref 0.7–4.0)
MCH: 31.2 pg (ref 26.0–34.0)
MCHC: 30.5 g/dL (ref 30.0–36.0)
MCV: 102.3 fL — ABNORMAL HIGH (ref 80.0–100.0)
Monocytes Absolute: 1.4 10*3/uL — ABNORMAL HIGH (ref 0.1–1.0)
Monocytes Relative: 11 %
Neutro Abs: 10.3 10*3/uL — ABNORMAL HIGH (ref 1.7–7.7)
Neutrophils Relative %: 78 %
Platelets: 287 10*3/uL (ref 150–400)
RBC: 3.08 MIL/uL — ABNORMAL LOW (ref 3.87–5.11)
RDW: 13.1 % (ref 11.5–15.5)
WBC: 12.9 10*3/uL — ABNORMAL HIGH (ref 4.0–10.5)
nRBC: 0 % (ref 0.0–0.2)

## 2022-08-20 LAB — RESP PANEL BY RT-PCR (RSV, FLU A&B, COVID)  RVPGX2
Influenza A by PCR: NEGATIVE
Influenza B by PCR: NEGATIVE
Resp Syncytial Virus by PCR: NEGATIVE
SARS Coronavirus 2 by RT PCR: NEGATIVE

## 2022-08-20 LAB — I-STAT CHEM 8, ED
BUN: 14 mg/dL (ref 8–23)
Calcium, Ion: 1.09 mmol/L — ABNORMAL LOW (ref 1.15–1.40)
Chloride: 98 mmol/L (ref 98–111)
Creatinine, Ser: 0.8 mg/dL (ref 0.44–1.00)
Glucose, Bld: 103 mg/dL — ABNORMAL HIGH (ref 70–99)
HCT: 28 % — ABNORMAL LOW (ref 36.0–46.0)
Hemoglobin: 9.5 g/dL — ABNORMAL LOW (ref 12.0–15.0)
Potassium: 4.2 mmol/L (ref 3.5–5.1)
Sodium: 135 mmol/L (ref 135–145)
TCO2: 33 mmol/L — ABNORMAL HIGH (ref 22–32)

## 2022-08-20 LAB — MRSA NEXT GEN BY PCR, NASAL: MRSA by PCR Next Gen: NOT DETECTED

## 2022-08-20 LAB — PHOSPHORUS: Phosphorus: 2.9 mg/dL (ref 2.5–4.6)

## 2022-08-20 LAB — BRAIN NATRIURETIC PEPTIDE: B Natriuretic Peptide: 159.4 pg/mL — ABNORMAL HIGH (ref 0.0–100.0)

## 2022-08-20 LAB — MAGNESIUM: Magnesium: 1.6 mg/dL — ABNORMAL LOW (ref 1.7–2.4)

## 2022-08-20 LAB — TROPONIN I (HIGH SENSITIVITY)
Troponin I (High Sensitivity): 21 ng/L — ABNORMAL HIGH (ref <18)
Troponin I (High Sensitivity): 22 ng/L — ABNORMAL HIGH (ref <18)

## 2022-08-20 LAB — PROCALCITONIN: Procalcitonin: <0.1 ng/mL

## 2022-08-20 MED ORDER — ASPIRIN 81 MG PO TBEC
81.0000 mg | DELAYED_RELEASE_TABLET | Freq: Every day | ORAL | Status: DC
  Administered 2022-08-20 – 2022-08-22 (×3): 81 mg via ORAL
  Filled 2022-08-20 (×3): qty 1

## 2022-08-20 MED ORDER — IPRATROPIUM-ALBUTEROL 0.5-2.5 (3) MG/3ML IN SOLN
3.0000 mL | Freq: Once | RESPIRATORY_TRACT | Status: AC
  Administered 2022-08-20: 3 mL via RESPIRATORY_TRACT
  Filled 2022-08-20: qty 3

## 2022-08-20 MED ORDER — OYSTER SHELL CALCIUM/D3 500-5 MG-MCG PO TABS
1.0000 | ORAL_TABLET | Freq: Every day | ORAL | Status: DC
  Administered 2022-08-20 – 2022-08-22 (×3): 1 via ORAL
  Filled 2022-08-20 (×3): qty 1

## 2022-08-20 MED ORDER — PREDNISONE 5 MG PO TABS
5.0000 mg | ORAL_TABLET | Freq: Every day | ORAL | Status: DC
  Administered 2022-08-20 – 2022-08-22 (×3): 5 mg via ORAL
  Filled 2022-08-20 (×3): qty 1

## 2022-08-20 MED ORDER — FERROUS SULFATE 325 (65 FE) MG PO TABS
325.0000 mg | ORAL_TABLET | Freq: Every day | ORAL | Status: DC
  Administered 2022-08-20 – 2022-08-22 (×3): 325 mg via ORAL
  Filled 2022-08-20 (×3): qty 1

## 2022-08-20 MED ORDER — ACETAMINOPHEN 650 MG RE SUPP: 650.0000 mg | Freq: Four times a day (QID) | RECTAL | Status: DC | PRN

## 2022-08-20 MED ORDER — SODIUM CHLORIDE 0.9 % IV BOLUS
250.0000 mL | Freq: Once | INTRAVENOUS | Status: AC
  Administered 2022-08-20: 250 mL via INTRAVENOUS

## 2022-08-20 MED ORDER — IRON 325 (65 FE) MG PO TABS: 1.0000 | ORAL_TABLET | Freq: Every day | ORAL | Status: DC

## 2022-08-20 MED ORDER — TIZANIDINE HCL 4 MG PO TABS: 2.0000 mg | ORAL_TABLET | Freq: Three times a day (TID) | ORAL | Status: DC | PRN

## 2022-08-20 MED ORDER — SODIUM CHLORIDE 0.9 % IV SOLN
500.0000 mg | INTRAVENOUS | Status: DC
  Administered 2022-08-20 – 2022-08-21 (×2): 500 mg via INTRAVENOUS
  Filled 2022-08-20 (×4): qty 5

## 2022-08-20 MED ORDER — SERTRALINE HCL 150 MG PO CAPS: 1.0000 | ORAL_CAPSULE | Freq: Every day | ORAL | Status: DC

## 2022-08-20 MED ORDER — CALCIUM CARB-CHOLECALCIFEROL 500-10 MG-MCG PO TABS: 1.0000 | ORAL_TABLET | Freq: Every day | ORAL | Status: DC

## 2022-08-20 MED ORDER — SODIUM CHLORIDE 0.9 % IV SOLN
1.0000 g | INTRAVENOUS | Status: DC
  Administered 2022-08-20 – 2022-08-21 (×2): 1 g via INTRAVENOUS
  Filled 2022-08-20 (×2): qty 10

## 2022-08-20 MED ORDER — IOHEXOL 350 MG/ML SOLN
75.0000 mL | Freq: Once | INTRAVENOUS | Status: AC | PRN
  Administered 2022-08-20: 75 mL via INTRAVENOUS

## 2022-08-20 MED ORDER — LEVALBUTEROL HCL 1.25 MG/0.5ML IN NEBU
1.2500 mg | INHALATION_SOLUTION | Freq: Three times a day (TID) | RESPIRATORY_TRACT | Status: DC
  Administered 2022-08-20 – 2022-08-21 (×5): 1.25 mg via RESPIRATORY_TRACT
  Filled 2022-08-20 (×6): qty 0.5

## 2022-08-20 MED ORDER — IPRATROPIUM BROMIDE 0.02 % IN SOLN
0.5000 mg | Freq: Three times a day (TID) | RESPIRATORY_TRACT | Status: DC
  Administered 2022-08-20 – 2022-08-21 (×5): 0.5 mg via RESPIRATORY_TRACT
  Filled 2022-08-20 (×5): qty 2.5

## 2022-08-20 MED ORDER — TRAMADOL HCL 50 MG PO TABS: 50.0000 mg | ORAL_TABLET | Freq: Three times a day (TID) | ORAL | Status: DC | PRN

## 2022-08-20 MED ORDER — SODIUM CHLORIDE 0.9 % IV SOLN: 2.0000 g | Freq: Two times a day (BID) | INTRAVENOUS | Status: DC

## 2022-08-20 MED ORDER — SODIUM CHLORIDE (PF) 0.9 % IJ SOLN
INTRAMUSCULAR | Status: AC
  Filled 2022-08-20: qty 50

## 2022-08-20 MED ORDER — ALBUTEROL SULFATE (2.5 MG/3ML) 0.083% IN NEBU: 2.5000 mg | INHALATION_SOLUTION | RESPIRATORY_TRACT | Status: DC | PRN

## 2022-08-20 MED ORDER — PANTOPRAZOLE SODIUM 40 MG PO TBEC
40.0000 mg | DELAYED_RELEASE_TABLET | Freq: Every day | ORAL | Status: DC
  Administered 2022-08-20 – 2022-08-22 (×3): 40 mg via ORAL
  Filled 2022-08-20 (×3): qty 1

## 2022-08-20 MED ORDER — LEFLUNOMIDE 10 MG PO TABS
10.0000 mg | ORAL_TABLET | Freq: Every day | ORAL | Status: DC
  Administered 2022-08-20 – 2022-08-22 (×3): 10 mg via ORAL
  Filled 2022-08-20 (×4): qty 1

## 2022-08-20 MED ORDER — LORATADINE 10 MG PO TABS
10.0000 mg | ORAL_TABLET | Freq: Every day | ORAL | Status: DC
  Administered 2022-08-20 – 2022-08-22 (×3): 10 mg via ORAL
  Filled 2022-08-20 (×3): qty 1

## 2022-08-20 MED ORDER — GABAPENTIN 100 MG PO CAPS
100.0000 mg | ORAL_CAPSULE | Freq: Two times a day (BID) | ORAL | Status: DC
  Administered 2022-08-20 – 2022-08-22 (×5): 100 mg via ORAL
  Filled 2022-08-20 (×5): qty 1

## 2022-08-20 MED ORDER — IRBESARTAN 300 MG PO TABS
300.0000 mg | ORAL_TABLET | Freq: Every day | ORAL | Status: DC
  Administered 2022-08-20 – 2022-08-22 (×3): 300 mg via ORAL
  Filled 2022-08-20 (×3): qty 1

## 2022-08-20 MED ORDER — MIRTAZAPINE 15 MG PO TABS
7.5000 mg | ORAL_TABLET | Freq: Every day | ORAL | Status: DC
  Administered 2022-08-20 – 2022-08-21 (×2): 7.5 mg via ORAL
  Filled 2022-08-20 (×2): qty 1

## 2022-08-20 MED ORDER — IPRATROPIUM-ALBUTEROL 0.5-2.5 (3) MG/3ML IN SOLN
3.0000 mL | Freq: Three times a day (TID) | RESPIRATORY_TRACT | Status: DC
  Administered 2022-08-20: 3 mL via RESPIRATORY_TRACT
  Filled 2022-08-20: qty 3

## 2022-08-20 MED ORDER — METOPROLOL TARTRATE 5 MG/5ML IV SOLN
2.5000 mg | INTRAVENOUS | Status: AC | PRN
  Administered 2022-08-20 (×2): 2.5 mg via INTRAVENOUS
  Filled 2022-08-20 (×2): qty 5

## 2022-08-20 MED ORDER — AMLODIPINE BESYLATE 10 MG PO TABS
5.0000 mg | ORAL_TABLET | Freq: Every day | ORAL | Status: DC
  Administered 2022-08-20 – 2022-08-22 (×3): 5 mg via ORAL
  Filled 2022-08-20 (×3): qty 1

## 2022-08-20 MED ORDER — ATORVASTATIN CALCIUM 20 MG PO TABS
20.0000 mg | ORAL_TABLET | Freq: Every evening | ORAL | Status: DC
  Administered 2022-08-20 – 2022-08-21 (×2): 20 mg via ORAL
  Filled 2022-08-20 (×2): qty 1

## 2022-08-20 MED ORDER — CARVEDILOL 3.125 MG PO TABS
3.1250 mg | ORAL_TABLET | Freq: Two times a day (BID) | ORAL | Status: DC
  Administered 2022-08-20 – 2022-08-22 (×4): 3.125 mg via ORAL
  Filled 2022-08-20 (×4): qty 1

## 2022-08-20 MED ORDER — ONDANSETRON HCL 4 MG PO TABS: 4.0000 mg | ORAL_TABLET | Freq: Four times a day (QID) | ORAL | Status: DC | PRN

## 2022-08-20 MED ORDER — VANCOMYCIN HCL IN DEXTROSE 1-5 GM/200ML-% IV SOLN: 1000.0000 mg | INTRAVENOUS | Status: DC

## 2022-08-20 MED ORDER — MONTELUKAST SODIUM 10 MG PO TABS
10.0000 mg | ORAL_TABLET | Freq: Every day | ORAL | Status: DC
  Administered 2022-08-20 – 2022-08-21 (×2): 10 mg via ORAL
  Filled 2022-08-20 (×2): qty 1

## 2022-08-20 MED ORDER — ACETAMINOPHEN 325 MG PO TABS: 650.0000 mg | ORAL_TABLET | Freq: Four times a day (QID) | ORAL | Status: DC | PRN

## 2022-08-20 MED ORDER — MECLIZINE HCL 25 MG PO TABS: 12.5000 mg | ORAL_TABLET | Freq: Two times a day (BID) | ORAL | Status: DC | PRN

## 2022-08-20 MED ORDER — VITAMIN B-12 1000 MCG PO TABS
1000.0000 ug | ORAL_TABLET | Freq: Every day | ORAL | Status: DC
  Administered 2022-08-20 – 2022-08-22 (×3): 1000 ug via ORAL
  Filled 2022-08-20 (×3): qty 1

## 2022-08-20 MED ORDER — SODIUM CHLORIDE 0.9 % IV SOLN
2.0000 g | INTRAVENOUS | Status: AC
  Administered 2022-08-20: 2 g via INTRAVENOUS
  Filled 2022-08-20: qty 12.5

## 2022-08-20 MED ORDER — ONDANSETRON HCL 4 MG/2ML IJ SOLN: 4.0000 mg | Freq: Four times a day (QID) | INTRAMUSCULAR | Status: DC | PRN

## 2022-08-20 MED ORDER — SERTRALINE HCL 50 MG PO TABS
150.0000 mg | ORAL_TABLET | Freq: Every day | ORAL | Status: DC
  Administered 2022-08-20 – 2022-08-22 (×3): 150 mg via ORAL
  Filled 2022-08-20 (×3): qty 1

## 2022-08-20 MED ORDER — VITAMIN D3 25 MCG (1000 UNIT) PO TABS
1000.0000 [IU] | ORAL_TABLET | Freq: Every day | ORAL | Status: DC
  Administered 2022-08-20 – 2022-08-22 (×3): 1000 [IU] via ORAL
  Filled 2022-08-20 (×3): qty 1

## 2022-08-20 MED ORDER — VANCOMYCIN HCL IN DEXTROSE 1-5 GM/200ML-% IV SOLN
1000.0000 mg | Freq: Once | INTRAVENOUS | Status: AC
  Administered 2022-08-20: 1000 mg via INTRAVENOUS
  Filled 2022-08-20: qty 200

## 2022-08-20 MED ORDER — MAGNESIUM SULFATE 2 GM/50ML IV SOLN
2.0000 g | Freq: Once | INTRAVENOUS | Status: AC
  Administered 2022-08-20: 2 g via INTRAVENOUS
  Filled 2022-08-20: qty 50

## 2022-08-20 MED ORDER — HYDROXYCHLOROQUINE SULFATE 200 MG PO TABS
200.0000 mg | ORAL_TABLET | Freq: Every day | ORAL | Status: DC
  Administered 2022-08-20 – 2022-08-22 (×3): 200 mg via ORAL
  Filled 2022-08-20 (×4): qty 1

## 2022-08-20 NOTE — Progress Notes (Signed)
Pharmacy Antibiotic Note  Desiree [name] is a 77 y.o. female admitted on 08/19/2022 with pneumonia.  Pharmacy has been consulted for Cefepime & Vancomycin dosing. Patient resides in nursing facility and undergoing radiation treatment for lung CA. Renal function at patient's baseline.   Plan: Cefepime 2gm IV q12h Vancomycin 1gm IV q36h to target AUC 400-550- estimated AUC on this regimen = 520 Monitor renal function and cx data  Check MRSA PCR  Height: 5' (152.4 cm) Weight: 46.3 kg (102 lb) IBW/kg (Calculated) : 45.5  Temp (24hrs), Avg:98.2 F (36.8 C), Min:98.1 F (36.7 C), Max:98.2 F (36.8 C)  Recent Labs  Lab 08/19/22 2250 08/20/22 0020  WBC 12.9*  --   CREATININE 0.77 0.80    Estimated Creatinine Clearance: 41 mL/min (by C-G formula based on SCr of 0.8 mg/dL).    Allergies  Allergen Reactions   Other Shortness Of Breath    Cats   Oxycodone Other (See Comments)    hallucinations    Antimicrobials this admission: 7/17 Cefepime >>  7/17 Vancomycin >>   Dose adjustments this admission:  Microbiology results: 7/17 BCx:  7/17 Resp PCR: negative MRSA PCR:   Thank you for allowing pharmacy to be a part of this patient's care.  Junita Push PharmD 08/20/2022 5:24 AM

## 2022-08-20 NOTE — Progress Notes (Signed)
[facility] admitting physician addendum:  Nursing staff informed me that the patient is having tachycardia in the 120s and 130s, and occasionally in the 140s with sinus tachycardia morphology.  She had recently received a nebulizer treatment.  I have switched the DuoNebs to levalbuterol (Xopenex) close ipratropium, also ordered magnesium sulfate 2 g IVPB, metoprolol 2.5 mg IVP as needed x 2 doses and 250 mL normal saline bolus.  Sanda Klein, MD.

## 2022-08-20 NOTE — H&P (Signed)
History and Physical    Patient: Desiree [name] ZOX:[id number] DOB: 06-Mar-1945 DOA: 08/19/2022 DOS: the patient was seen and examined on 08/20/2022 PCP: Patient, No Pcp Per  Patient coming from: Home  Chief Complaint:  Chief Complaint  Patient presents with   Cough   HPI: Desiree [name] is a 77 y.o. female with medical history significant of Paroxysmal atrial fibrillation, osteoporosis, alcoholism, allergic rhinitis, atherosclerotic heart disease of native coronary artery, benign positional vertigo, ascending colon cancer, CAD allergies, sensory level emphysema, chronic anemia, COPD on oxygen at 3 LPM via [place], CKD, early satiety, essential hypertension GERD, history of GI hemorrhage, pulmonary embolism, lung cancer, history of radiation therapy to left lung, hypertension, hypothyroidism, idiopathic peripheral neuropathy, iron deficiency anemia, depression, hyperlipidemia, lung nodules on CT, cirrhosis of the liver, B12 deficiency, prediabetes, pulmonary embolism, rheumatoid arthritis, senile purpura, Schatzki's ring, skin rash, status postthoracentesis, symptomatic anemia, type 2 diabetes, vitamin B12 deficiency, vitamin D deficiency who was brought from her facility due to productive cough associated with dyspnea and wheezing for 1 week.  She denied fever, chills,  sore throat, wheezing or hemoptysis, but has had some rhinorrhea.  No chest pain, palpitations, diaphoresis, PND, orthopnea or recent pitting edema of the lower extremities.  Her appetite is decreased, but no abdominal pain, nausea, emesis, diarrhea, melena or hematochezia.  No flank pain, dysuria, frequency or hematuria.  No polyuria, polydipsia, polyphagia or blurred vision.   Lab work: Coronavirus, influenza and RSV PCR was negative.  CBC showed a white count of 12.9, hemoglobin 9.6 g/dL with an MCV of 811.9 fL and platelets 287.  Troponin was 21 then 22 ng/L and BNP 859.4 pg/mL.  BMP showed a sodium 133 mmol/L, glucose 101 and  calcium 8.8 mg/dL.  The rest of the electrolytes and renal function were normal.  Imaging: Portable 1 view chest radiograph with shallow inspiration.  Perihilar infiltrates are suggestive, likely pneumonia or edema.  CTA chest with no PE.  There is multifocal infiltrate within the right upper lobe and left lower lobe with more extensive infiltrate and peripheral consolidation within the right lower lobe in keeping with changes of multifocal infection in the acute setting.  Superimposed airway inflammation.  Moderate multivessel coronary artery calcification.  Mild global cardiomegaly with left ventricular enlargement.  Emphysema.  Bronchomalacia with marked narrowing and near quantitation of the right bronchus intermedius and to a lesser extent coaptation of the right mainstem bronchus.  Aortic atherosclerosis.   ED course: Initial vital signs were temperature 98.2 F, pulse 97, respiration 22, BP 128/89 mmHg O2 sat 96% on nasal cannula oxygen at 3 LPM.  The patient received vancomycin, cefepime and a DuoNeb.  Review of Systems: As mentioned in the history of present illness. All other systems reviewed and are negative. Past Medical History:  Diagnosis Date   Abnormal nuclear stress test    AF (paroxysmal atrial fibrillation) (HCC)    Age-related osteoporosis without current pathological fracture 05/16/2016   Alcoholism (HCC) 10/21/2018   Formatting of this note might be different from the original. 2 glasses a day.   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 Wrist surgery   Benign paroxysmal positional vertigo due to bilateral vestibular disorder 02/09/2020   Cancer of ascending colon (HCC)    Carcinoma of ascending colon (HCC)    Cat allergies    Centrilobular  emphysema (HCC) 05/16/2016   Chronic anemia 04/09/2021   Chronic atrial  fibrillation, unspecified (HCC) 10/04/2020   Chronic obstructive pulmonary disease (HCC) 04/09/2021   Chronic pain 04/09/2021   CKD (chronic kidney disease) stage 3, GFR 30-59 ml/min (HCC) 01/04/2018   COPD (chronic obstructive pulmonary disease) (HCC)    Cysts of both ovaries 11/24/2019   Formatting of this note might be different from the original. On CT 11/2019. Referred to GYN.   Dyspnea    Early satiety 04/14/2019   Essential hypertension 08/22/2010   Formatting of this note might be different from the original. Formatting of this note might be different from the original. D/C ACE 08/22/2010 due to concerns with freq aecopd/ pseudoasthma > resolved  Last Assessment & Plan:  Formatting of this note might be different from the original. Adequate control on present rx, reviewed - note she's no longer falling apart during her aecopd's which are less   Gastro-esophageal reflux disease without esophagitis 07/14/2017   Gastrointestinal hemorrhage    History of pulmonary embolus (PE) 07/14/2017   History of radiation therapy    Left lung- SBRT 11/13/20-11/27/20- Dr. Antony Blackbird   History of radiation therapy    Right and Left Lung 06/16/22-06/27/22- Dr. 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   Lung cancer, lower lobe (HCC) 09/24/2020   Lung nodule, solitary 08/16/2020   Added automatically from request for surgery [id number]   Malaise and fatigue 07/14/2017   Malignant neoplasm of unspecified part of left bronchus or lung (HCC) 04/09/2021   Malignant tumor of ascending colon (HCC) 04/09/2021   Mild CAD 02/02/2019   Mild episode of recurrent major depressive disorder (HCC) 07/14/2017   Mixed hyperlipidemia 07/14/2017   Multiple lung nodules on CT 05/16/2016   Osteoporosis    Other cirrhosis of  liver (HCC) 11/24/2019   Formatting of this note might be different from the original. Possible???? See CT comments 11/2019.   Other vitamin B12 deficiency anemias 04/09/2021   Paroxysmal atrial fibrillation (HCC) 04/14/2019   Formatting of this note might be different from the original. [facility].   Prediabetes 07/14/2017   Pulmonary embolism (HCC) 2017   Rheumatoid arthritis (HCC) 09/20/2010   Dr Nickola Major in [place].   Rheumatoid arthritis(714.0)    Dr Azzie Roup follows   Schatzki's ring    Senile purpura (HCC) 01/04/2018   Skin rash 07/14/2017   Bx'd Allergic. Likely meds.   Status post thoracentesis    Symptomatic anemia 07/25/2020   Type 2 diabetes mellitus without complications (HCC) 04/09/2021   Vitamin B12 deficiency 07/14/2017   Vitamin D deficiency 04/09/2021   Past Surgical History:  Procedure Laterality Date   BIOPSY  07/27/2020   Procedure: BIOPSY;  Surgeon: Iva Boop, MD;  Location: [facility] ENDOSCOPY;  Service: Endoscopy;;   BRONCHIAL BIOPSY  09/11/2020   Procedure: BRONCHIAL BIOPSIES;  Surgeon: Josephine Igo, DO;  Location: [facility] ENDOSCOPY;  Service: Pulmonary;;   BRONCHIAL BRUSHINGS  09/11/2020   Procedure: BRONCHIAL BRUSHINGS;  Surgeon: Josephine Igo, DO;  Location: [facility] ENDOSCOPY;  Service: Pulmonary;;   BRONCHIAL NEEDLE ASPIRATION BIOPSY  09/11/2020   Procedure: BRONCHIAL NEEDLE ASPIRATION BIOPSIES;  Surgeon: Josephine Igo, DO;  Location: [facility] ENDOSCOPY;  Service: Pulmonary;;   BRONCHIAL WASHINGS  09/11/2020   Procedure: BRONCHIAL WASHINGS;  Surgeon:  Josephine Igo, DO;  Location: [facility] ENDOSCOPY;  Service: Pulmonary;;   COLONOSCOPY N/A 07/27/2020   Procedure: COLONOSCOPY;  Surgeon: Iva Boop, MD;  Location: [facility] ENDOSCOPY;  Service: Endoscopy;  Laterality: N/A;   CYSTECTOMY Right    breast   ESOPHAGOGASTRODUODENOSCOPY (EGD) WITH PROPOFOL N/A 07/27/2020   Procedure: ESOPHAGOGASTRODUODENOSCOPY (EGD) WITH PROPOFOL;  Surgeon: Iva Boop, MD;  Location:  [facility]  ENDOSCOPY;  Service: Endoscopy;  Laterality: N/A;   FIDUCIAL MARKER PLACEMENT  09/11/2020   Procedure: FIDUCIAL MARKER PLACEMENT;  Surgeon: Josephine Igo, DO;  Location: [facility] ENDOSCOPY;  Service: Pulmonary;;   LAPAROSCOPIC RIGHT HEMI COLECTOMY Right 07/31/2020   Procedure: LAPAROSCOPIC ASSISSTED  RIGHT HEMI COLECTOMY;  Surgeon: Manus Rudd, MD;  Location: [facility] OR;  Service: General;  Laterality: Right;   LEFT HEART CATH AND CORONARY ANGIOGRAPHY N/A 01/05/2019   Procedure: LEFT HEART CATH AND CORONARY ANGIOGRAPHY;  Surgeon: Corky Crafts, MD;  Location: [facility] INVASIVE CV LAB;  Service: Cardiovascular;  Laterality: N/A;   SUBMUCOSAL TATTOO INJECTION  07/27/2020   Procedure: SUBMUCOSAL TATTOO INJECTION;  Surgeon: Iva Boop, MD;  Location: [facility] ENDOSCOPY;  Service: Endoscopy;;   VIDEO BRONCHOSCOPY WITH ENDOBRONCHIAL NAVIGATION Left 09/11/2020   Procedure: VIDEO BRONCHOSCOPY WITH ENDOBRONCHIAL NAVIGATION;  Surgeon: Josephine Igo, DO;  Location: [facility] ENDOSCOPY;  Service: Pulmonary;  Laterality: Left;  ION   VIDEO BRONCHOSCOPY WITH RADIAL ENDOBRONCHIAL ULTRASOUND  09/11/2020   Procedure: RADIAL ENDOBRONCHIAL ULTRASOUND;  Surgeon: Josephine Igo, DO;  Location: [facility] ENDOSCOPY;  Service: Pulmonary;;   Social History:  reports that she quit smoking about 21 years ago. Her smoking use included cigarettes. She started smoking about 61 years ago. She has a 40 pack-year smoking history. She has never used smokeless tobacco. She reports current alcohol use of about 2.0 standard drinks of alcohol per week. She reports that she does not use drugs.  Allergies  Allergen Reactions   Other Shortness Of Breath    Cats   Oxycodone Other (See Comments)    hallucinations    Family History  Problem Relation Age of Onset   Heart disease Father    Stroke Mother    Clotting disorder Mother    Heart disease Sister    Sleep apnea Sister    Emphysema Sister    Breast cancer Maternal Aunt    Emphysema Paternal  Grandmother     Prior to Admission medications   Medication Sig Start Date End Date Taking? Authorizing Provider  acetaminophen (TYLENOL) 325 MG tablet Take 325 mg by mouth every 6 (six) hours as needed for moderate pain or headache.   Yes [provider]  albuterol (VENTOLIN HFA) 108 (90 Base) MCG/ACT inhaler Inhale 2 puffs into the lungs every 4 (four) hours as needed for shortness of breath (shortness of breath (related to COPD)).   Yes [provider]  amLODipine (NORVASC) 5 MG tablet Take 1 tablet (5 mg total) by mouth daily. Patient needs appointment for further refills. 1 st attempt 10/02/20  Yes Tobb, Kardie, DO  aspirin EC 81 MG tablet Take 81 mg by mouth daily.   Yes [provider]  atorvastatin (LIPITOR) 20 MG tablet Take 1 tablet (20 mg total) by mouth daily. Patient needs to keep appointment for further refills. 1 st attempt Patient taking differently: Take 20 mg by mouth every evening. Patient needs to keep appointment for further refills. 1 st attempt 10/05/20  Yes Tobb, Kardie, DO  Budeson-Glycopyrrol-Formoterol (BREZTRI AEROSPHERE) 160-9-4.8 MCG/ACT AERO Inhale 2 puffs  into the lungs 2 (two) times daily. 01/19/20  Yes Icard, Rachel Bo, DO  carvedilol (COREG) 3.125 MG tablet Take 1 tablet (3.125 mg total) by mouth 2 (two) times daily with a meal. 08/13/20  Yes Tobb, Kardie, DO  Cholecalciferol (D3-1000) 25 MCG (1000 UT) capsule Take 1,000 Units by mouth daily.   Yes [provider]  Ferrous Sulfate (IRON) 325 (65 Fe) MG TABS Take 1 tablet by mouth daily. 10/08/21  Yes [provider]  fexofenadine (ALLEGRA) 180 MG tablet Take 180 mg by mouth daily.   Yes [provider]  gabapentin (NEURONTIN) 100 MG capsule Take 100 mg by mouth 2 (two) times daily. 10/01/21  Yes [provider]  hydroxychloroquine (PLAQUENIL) 200 MG tablet Take 200 mg by mouth daily. 09/17/15  Yes [provider]  leflunomide (ARAVA) 10 MG tablet Take  10 mg by mouth daily. 04/05/21  Yes [provider]  loperamide (IMODIUM A-D) 2 MG tablet Take 2 mg by mouth as needed for diarrhea or loose stools (MAX 10 PILLS/24 HRS.).   Yes [provider]  meclizine (ANTIVERT) 12.5 MG tablet Take 12.5 mg by mouth 2 (two) times daily as needed for dizziness. 05/20/22  Yes [provider]  mirtazapine (REMERON) 7.5 MG tablet Take 7.5 mg by mouth at bedtime.   Yes [provider]  montelukast (SINGULAIR) 10 MG tablet Take 1 tablet (10 mg total) by mouth at bedtime. 10/21/20  Yes Icard, Rachel Bo, DO  omeprazole (PRILOSEC) 20 MG capsule Take 1 capsule (20 mg total) by mouth daily. 07/11/20  Yes Icard, Bradley L, DO  ondansetron (ZOFRAN-ODT) 4 MG disintegrating tablet Take 1 tablet (4 mg total) by mouth every 8 (eight) hours as needed for nausea or vomiting. 08/05/20  Yes Fayette Pho, MD  OYSTERCAL-D 500-10 MG-MCG TABS Take 1 tablet by mouth daily. 03/06/22  Yes [provider]  predniSONE (DELTASONE) 5 MG tablet Take 5 mg by mouth daily with breakfast.   Yes [provider]  Sertraline HCl 150 MG CAPS Take 1 capsule by mouth daily. 04/09/22  Yes [provider]  tiZANidine (ZANAFLEX) 2 MG tablet Take 2 mg by mouth 2 (two) times daily. 09/02/21  Yes [provider]  traMADol (ULTRAM) 50 MG tablet Take 1 tablet (50 mg total) by mouth 3 (three) times daily as needed for moderate pain (pain). 06/18/22  Yes Burnadette Pop, MD  valsartan (DIOVAN) 320 MG tablet Take 320 mg by mouth in the morning.   Yes [provider]  vitamin B-12 (CYANOCOBALAMIN) 1000 MCG tablet Take 1,000 mcg by mouth daily.    Yes [provider]    Physical Exam: Vitals:   08/20/22 0400 08/20/22 0500 08/20/22 0524 08/20/22 0600  BP: (!) 126/53 (!) 109/53  127/74  Pulse: 99 100  91  Resp: 18 18  18   Temp:   98.6 F (37 C)   TempSrc:   Oral   SpO2: 98% 98%  99%  Weight:      Height:       Physical  Exam Vitals and nursing note reviewed.  Constitutional:      General: She is awake. She is not in acute distress.    Appearance: Normal appearance.     Interventions: Nasal cannula in place.  HENT:     Head: Normocephalic.     Nose: No rhinorrhea.     Mouth/Throat:     Mouth: Mucous membranes are moist.  Eyes:     General:  No scleral icterus.    Pupils: Pupils are equal, round, and reactive to light.  Neck:     Vascular: No JVD.  Cardiovascular:     Rate and Rhythm: Normal rate and regular rhythm.     Heart sounds: S1 normal and S2 normal.  Pulmonary:     Effort: Pulmonary effort is normal.     Breath sounds: Wheezing and rales present. No rhonchi.  Abdominal:     General: Bowel sounds are normal. There is no distension.     Palpations: Abdomen is soft.     Tenderness: There is no abdominal tenderness. There is no right CVA tenderness or left CVA tenderness.  Musculoskeletal:     Cervical back: Neck supple.     Right lower leg: No edema.     Left lower leg: No edema.  Skin:    General: Skin is warm and dry.  Neurological:     General: No focal deficit present.     Mental Status: She is alert and oriented to person, place, and time.  Psychiatric:        Mood and Affect: Mood normal.        Behavior: Behavior normal. Behavior is cooperative.     Data Reviewed:  Results are pending, will review when available.  Assessment and Plan: Principal Problem:   Multifocal pneumonia Superimposed on   Centrilobular emphysema (HCC) Admit to PCU/inpatient. Continue supplemental oxygen. Scheduled and as needed bronchodilators. Continue ceftriaxone 1 g IVPB daily. Continue azithromycin 500 mg IVPB daily. Check strep pneumoniae urinary antigen. Check sputum Gram stain, culture and sensitivity. Follow-up blood culture and sensitivity. Follow-up CBC and chemistry in the morning.  Active Problems:   Hypomagnesemia  Magnesium sulfate 2 g IVPB.    Hypertension Continue  amlodipine 5 mg p.o. daily. Continue carvedilol 3.125 mg p.o. twice daily. Continue valsartan 320 mg p.o. daily.    CKD (chronic kidney disease) stage 3, GFR 30-59 ml/min (HCC) Monitor renal function and electrolytes.    Gastro-esophageal reflux disease without esophagitis Continue omeprazole or formulary equivalent.    Mixed hyperlipidemia Continue atorvastatin 20 mg p.o. daily.    Type 2 diabetes mellitus without complications (HCC) CBG monitoring before meals and bedtime.    Rheumatoid arthritis (HCC) Continue hydroxychloroquine 200 mg p.o. daily. Continue leflunomide 10 mg p.o. daily. Continue daily physiological prednisone.    Mild CAD Continue: Amlodipine, aspirin, statin beta-blocker.    Idiopathic peripheral neuropathy Continue gabapentin 100 mg p.o. twice daily.    Iron deficiency anemia due to chronic blood loss Monitor hematocrit and hemoglobin.    Lung cancer, lower lobe (HCC) Follow-up with Dr. Melvyn Neth as scheduled.    Advance Care Planning:   Code Status: Full Code   Consults:   Family Communication: Her daughter was at bedside.  Severity of Illness: The appropriate patient status for this patient is INPATIENT. Inpatient status is judged to be reasonable and necessary in order to provide the required intensity of service to ensure the patient's safety. The patient's presenting symptoms, physical exam findings, and initial radiographic and laboratory data in the context of their chronic comorbidities is felt to place them at high risk for further clinical deterioration. Furthermore, it is not anticipated that the patient will be medically stable for discharge from the hospital within 2 midnights of admission.   * I certify that at the point of admission it is my clinical judgment that the patient will require inpatient hospital care spanning beyond 2 midnights from the point of  admission due to high intensity of service, high risk for further deterioration and  high frequency of surveillance required.*  Author: Bobette Mo, MD 08/20/2022 7:10 AM  For on call review [email or web address].   This document was prepared using Dragon voice recognition software and may contain some unintended transcription errors.

## 2022-08-20 NOTE — ED Notes (Signed)
ED TO INPATIENT HANDOFF REPORT  Name/Age/Gender Desiree [name] 77 y.o. female  Code Status Code Status History     Date Active Date Inactive Code Status Order ID Comments User Context   06/15/2022 1429 06/19/2022 1717 Full Code [id number]  Maryln Gottron, MD ED   07/25/2020 1731 08/05/2020 2111 DNR [id number]  Evelena Leyden, DO ED   01/05/2019 1209 01/05/2019 1840 Full Code [id number]  Corky Crafts, MD Inpatient   11/23/2014 1745 11/24/2014 1839 DNR [id number]  Aldean Baker, MD Inpatient    Questions for Most Recent Historical Code Status (Order [id number])     Question Answer   By: Consent: discussion documented in EHR            Home/SNF/Other Nursing Home  Chief Complaint Multifocal pneumonia [J18.9]  Level of Care/Admitting Diagnosis ED Disposition     ED Disposition  Admit   Condition  --   Comment  Hospital Area: [facility] [[id number]]  Level of Care: Progressive [102]  Admit to Progressive based on following criteria: MULTISYSTEM THREATS such as stable sepsis, metabolic/electrolyte imbalance with or without encephalopathy that is responding to early treatment.  Admit to Progressive based on following criteria: RESPIRATORY PROBLEMS hypoxemic/hypercapnic respiratory failure that is responsive to NIPPV (BiPAP) or High Flow Nasal Cannula (6-80 lpm). Frequent assessment/intervention, no > Q2 hrs < Q4 hrs, to maintain oxygenation and pulmonary hygiene.  May admit patient to [facility] or [facility] if equivalent level of care is available:: No  Covid Evaluation: Asymptomatic - no recent exposure (last 10 days) testing not required  Diagnosis: Multifocal pneumonia [3474259]  Admitting Physician: Bobette Mo [[id number]]  Attending Physician: Bobette Mo [[id number]]  Certification:: I certify this patient will need inpatient services for at least 2 midnights  Estimated Length of Stay: 2          Medical History Past  Medical History:  Diagnosis Date   Abnormal nuclear stress test    AF (paroxysmal atrial fibrillation) (HCC)    Age-related osteoporosis without current pathological fracture 05/16/2016   Alcoholism (HCC) 10/21/2018   Formatting of this note might be different from the original. 2 glasses a day.   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 Wrist surgery   Benign paroxysmal positional vertigo due to bilateral vestibular disorder 02/09/2020   Cancer of ascending colon [facility])    Carcinoma of ascending colon (HCC)    Cat allergies    Centrilobular emphysema (HCC) 05/16/2016   Chronic anemia 04/09/2021   Chronic atrial fibrillation, unspecified (HCC) 10/04/2020   Chronic obstructive pulmonary disease (HCC) 04/09/2021   Chronic pain 04/09/2021   CKD (chronic kidney disease) stage 3, GFR 30-59 ml/min (HCC) 01/04/2018   COPD (chronic obstructive pulmonary disease) (HCC)    Cysts of both ovaries 11/24/2019   Formatting of this note might be different from the original. On CT 11/2019. Referred to GYN.   Dyspnea    Early satiety 04/14/2019   Essential hypertension 08/22/2010   Formatting of this note might be different from the original. Formatting of this note might be different from the original. D/C ACE 08/22/2010 due to concerns with freq aecopd/ pseudoasthma > resolved  Last Assessment & Plan:  Formatting of this note might be different from the original. Adequate control on present rx, reviewed - note she's no longer  falling apart during her aecopd's which are less   Gastro-esophageal reflux disease without esophagitis 07/14/2017   Gastrointestinal hemorrhage    History of pulmonary embolus (PE) 07/14/2017   History of radiation therapy    Left lung- SBRT 11/13/20-11/27/20- Dr. Antony Blackbird   History of radiation therapy    Right  and Left Lung 06/16/22-06/27/22- Dr. 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   Lung cancer, lower lobe (HCC) 09/24/2020   Lung nodule, solitary 08/16/2020   Added automatically from request for surgery [id number]   [facility] and fatigue 07/14/2017   Malignant neoplasm of unspecified part of left bronchus or lung (HCC) 04/09/2021   Malignant tumor of ascending colon (HCC) 04/09/2021   Mild CAD 02/02/2019   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 Possible???? See CT comments 11/2019.   Other vitamin B12 deficiency anemias 04/09/2021   Paroxysmal atrial fibrillation (HCC) 04/14/2019   Formatting of this note might be different from the original. [facility].   Prediabetes 07/14/2017   Pulmonary embolism (HCC) 2017   Rheumatoid arthritis (HCC) 09/20/2010   Dr Nickola Major in [place].   Rheumatoid arthritis(714.0)    Dr Azzie Roup follows   Schatzki's ring    Senile purpura (HCC) 01/04/2018   Skin rash 07/14/2017   Bx'd Allergic. Likely meds.   Status post thoracentesis    Symptomatic anemia 07/25/2020   Type 2 diabetes mellitus without complications (HCC) 04/09/2021   Vitamin B12 deficiency 07/14/2017   Vitamin D deficiency 04/09/2021    Allergies Allergies  Allergen Reactions   Other Shortness Of Breath    Cats   Oxycodone Other (See Comments)    hallucinations    IV Location/Drains/Wounds Patient Lines/Drains/Airways Status     Active Line/Drains/Airways     Name Placement date Placement time Site Days   Peripheral IV 08/19/22 20 G  Anterior;Proximal;Right Forearm 08/19/22  2250  Forearm  1            Labs/Imaging Results for orders placed or performed during the hospital encounter of 08/19/22 (from the past 48 hour(s))  CBC with Differential     Status: Abnormal   Collection Time: 08/19/22 10:50 PM  Result Value Ref Range   WBC 12.9 (H) 4.0 - 10.5 K/uL   RBC 3.08 (L) 3.87 - 5.11 MIL/uL   Hemoglobin 9.6 (L) 12.0 - 15.0 g/dL   HCT 33.2 (L) 95.1 - 88.4 %   MCV 102.3 (H) 80.0 - 100.0 fL   MCH 31.2 26.0 - 34.0 pg   MCHC 30.5 30.0 - 36.0 g/dL   RDW 16.6 06.3 - 01.6 %   Platelets 287 150 - 400 K/uL   nRBC 0.0 0.0 - 0.2 %   Neutrophils Relative % 78 %   Neutro Abs 10.3 (H) 1.7 - 7.7 K/uL   Lymphocytes Relative 6 %   Lymphs Abs 0.8 0.7 - 4.0 K/uL   Monocytes Relative 11 %   Monocytes Absolute 1.4 (H) 0.1 - 1.0 K/uL   Eosinophils Relative 3 %   Eosinophils Absolute 0.4 0.0 - 0.5 K/uL   Basophils  Relative 1 %   Basophils Absolute 0.1 0.0 - 0.1 K/uL   Immature Granulocytes 1 %   Abs Immature Granulocytes 0.06 0.00 - 0.07 K/uL    Comment: Performed at [facility], [address] [place], [place] [address]  Basic metabolic panel     Status: Abnormal   Collection Time: 08/19/22 10:50 PM  Result Value Ref Range   Sodium 133 (L) 135 - 145 mmol/L   Potassium 4.2 3.5 - 5.1 mmol/L   Chloride 98 98 - 111 mmol/L   CO2 28 22 - 32 mmol/L   Glucose, Bld 101 (H) 70 - 99 mg/dL    Comment: Glucose reference range applies only to samples taken after fasting for at least 8 hours.   BUN 14 8 - 23 mg/dL   Creatinine, Ser 0.98 0.44 - 1.00 mg/dL   Calcium 8.8 (L) 8.9 - 10.3 mg/dL   GFR, Estimated >11 >91 mL/min    Comment: (NOTE) Calculated using the CKD-EPI Creatinine Equation (2021)    Anion gap 7 5 - 15    Comment: Performed at [facility], [address] [place], [place] [address]  Troponin I (High Sensitivity)     Status: Abnormal   Collection Time: 08/19/22 10:50 PM  Result  Value Ref Range   Troponin I (High Sensitivity) 21 (H) <18 ng/L    Comment: (NOTE) Elevated high sensitivity troponin I (hsTnI) values and significant  changes across serial measurements may suggest ACS but many other  chronic and acute conditions are known to elevate hsTnI results.  Refer to the "Links" section for chest pain algorithms and additional  guidance. Performed at [facility], [address] [place], [place] [address]   Brain natriuretic peptide     Status: Abnormal   Collection Time: 08/19/22 10:50 PM  Result Value Ref Range   B Natriuretic Peptide 159.4 (H) 0.0 - 100.0 pg/mL    Comment: Performed at [facility], [address] [place], [place] [address]  I-stat chem 8, ED (not at [facility], [facility] or [facility])     Status: Abnormal   Collection Time: 08/20/22 12:20 AM  Result Value Ref Range   Sodium 135 135 - 145 mmol/L   Potassium 4.2 3.5 - 5.1 mmol/L   Chloride 98 98 - 111 mmol/L   BUN 14 8 - 23 mg/dL   Creatinine, Ser 8.46 0.44 - 1.00 mg/dL   Glucose, Bld 962 (H) 70 - 99 mg/dL    Comment: Glucose reference range applies only to samples taken after fasting for at least 8 hours.   Calcium, Ion 1.09 (L) 1.15 - 1.40 mmol/L   TCO2 33 (H) 22 - 32 mmol/L   Hemoglobin 9.5 (L) 12.0 - 15.0 g/dL   HCT 95.2 (L) 84.1 - 32.4 %  Troponin I (High Sensitivity)     Status: Abnormal   Collection Time: 08/20/22  2:08 AM  Result Value Ref Range   Troponin I (High Sensitivity) 22 (H) <18 ng/L    Comment: (NOTE) Elevated high sensitivity troponin I (hsTnI) values and significant  changes across serial measurements may suggest ACS but many other  chronic and acute conditions are known to elevate hsTnI results.  Refer to the "Links" section for chest pain algorithms and additional  guidance. Performed at [facility], [address] [place], [place] [address]   Resp panel by RT-PCR (RSV, Flu A&B, Covid) Anterior Nasal Swab      Status:  None   Collection Time: 08/20/22  2:08 AM   Specimen: Anterior Nasal Swab  Result Value Ref Range   SARS Coronavirus 2 by RT PCR NEGATIVE NEGATIVE    Comment: (NOTE) SARS-CoV-2 target nucleic acids are NOT DETECTED.  The SARS-CoV-2 RNA is generally detectable in upper respiratory specimens during the acute phase of infection. The lowest concentration of SARS-CoV-2 viral copies this assay can detect is 138 copies/mL. A negative result does not preclude SARS-Cov-2 infection and should not be used as the sole basis for treatment or other patient management decisions. A negative result may occur with  improper specimen collection/handling, submission of specimen other than nasopharyngeal swab, presence of viral mutation(s) within the areas targeted by this assay, and inadequate number of viral copies(<138 copies/mL). A negative result must be combined with clinical observations, patient history, and epidemiological information. The expected result is Negative.  Fact Sheet for Patients:  BloggerCourse.com  Fact Sheet for Healthcare Providers:  SeriousBroker.it  This test is no t yet approved or cleared by the [place] FDA and  has been authorized for detection and/or diagnosis of SARS-CoV-2 by FDA under an Emergency Use Authorization (EUA). This EUA will remain  in effect (meaning this test can be used) for the duration of the COVID-19 declaration under Section 564(b)(1) of the Act, 21 U.S.C.section 360bbb-3(b)(1), unless the authorization is terminated  or revoked sooner.       Influenza A by PCR NEGATIVE NEGATIVE   Influenza B by PCR NEGATIVE NEGATIVE    Comment: (NOTE) The Xpert Xpress SARS-CoV-2/FLU/RSV plus assay is intended as an aid in the diagnosis of influenza from Nasopharyngeal swab specimens and should not be used as a sole basis for treatment. Nasal washings and aspirates are unacceptable for Xpert Xpress  SARS-CoV-2/FLU/RSV testing.  Fact Sheet for Patients: BloggerCourse.com  Fact Sheet for Healthcare Providers: SeriousBroker.it  This test is not yet approved or cleared by the [place] FDA and has been authorized for detection and/or diagnosis of SARS-CoV-2 by FDA under an Emergency Use Authorization (EUA). This EUA will remain in effect (meaning this test can be used) for the duration of the COVID-19 declaration under Section 564(b)(1) of the Act, 21 U.S.C. section 360bbb-3(b)(1), unless the authorization is terminated or revoked.     Resp Syncytial Virus by PCR NEGATIVE NEGATIVE    Comment: (NOTE) Fact Sheet for Patients: BloggerCourse.com  Fact Sheet for Healthcare Providers: SeriousBroker.it  This test is not yet approved or cleared by the [place] FDA and has been authorized for detection and/or diagnosis of SARS-CoV-2 by FDA under an Emergency Use Authorization (EUA). This EUA will remain in effect (meaning this test can be used) for the duration of the COVID-19 declaration under Section 564(b)(1) of the Act, 21 U.S.C. section 360bbb-3(b)(1), unless the authorization is terminated or revoked.  Performed at [facility], [address], [place], [place] [address]    CT Angio Chest PE W and/or Wo Contrast  Result Date: 08/20/2022 CLINICAL DATA:  Chronic dyspnea, productive cough. History of recurrent lung cancer, colon cancer, and prior pulmonary embolism. EXAM: CT ANGIOGRAPHY CHEST WITH CONTRAST TECHNIQUE: Multidetector CT imaging of the chest was performed using the standard protocol during bolus administration of intravenous contrast. Multiplanar CT image reconstructions and MIPs were obtained to evaluate the vascular anatomy. RADIATION DOSE REDUCTION: This exam was performed according to the departmental dose-optimization program which includes  automated exposure control, adjustment of the mA and/or kV according to patient size and/or use of  iterative reconstruction technique. CONTRAST:  75mL OMNIPAQUE IOHEXOL 350 MG/ML SOLN COMPARISON:  04/15/2022 FINDINGS: Cardiovascular: There is adequate opacification of the pulmonary arterial tree. No intraluminal filling defect identified to suggest acute pulmonary embolism. Central pulmonary arteries are of normal caliber. Moderate multi-vessel coronary artery calcification. Mild global cardiomegaly with left ventricular enlargement. No pericardial effusion. Moderate atherosclerotic calcification within the thoracic aorta. No aortic aneurysm. Mediastinum/Nodes: No enlarged mediastinal, hilar, or axillary lymph nodes. Thyroid gland, trachea, and esophagus demonstrate no significant findings. Lungs/Pleura: Reticulonodular infiltrate is seen within the right upper lobe and left lower lobe and there is more extensive infiltrate as well as peripheral consolidation within the right lower lobe in keeping with changes of multifocal infection in the acute setting. Superimposed moderate centrilobular emphysema. Scarring is seen within the posterior basal left lower lobe, unchanged, with fiducial marker in place. Stable scarring within the basilar right middle lobe and lower lingula. No pneumothorax or pleural effusion. 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 There is extensive thickening of the peribronchovascular interstitium, likely related to airway inflammation. Upper Abdomen: No acute abnormality. Musculoskeletal: Numerous remote bilateral rib fractures noted. No acute bone abnormality. No lytic or blastic bone lesion Review of the MIP images confirms the above findings. IMPRESSION: 1. No pulmonary embolism. 2. Multifocal infiltrate within the right upper lobe and left lower lobe with more extensive  infiltrate and peripheral consolidation within the right lower lobe in keeping with changes of multifocal infection in the acute setting. Superimposed airway inflammation. 3. Moderate multi-vessel coronary artery calcification. Mild global cardiomegaly with left ventricular enlargement. 4. Emphysema. 5. 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 Aortic Atherosclerosis (ICD10-I70.0) and Emphysema (ICD10-J43.9). Electronically Signed   By: Helyn Numbers M.D.   On: 08/20/2022 01:50   DG Chest Port 1 View  Result Date: 08/19/2022 CLINICAL DATA:  Cough.  Productive cough for 1 week. EXAM: PORTABLE CHEST 1 VIEW COMPARISON:  06/20/2022 FINDINGS: Shallow inspiration. Heart size and pulmonary vascularity appear normal for technique. Perihilar infiltrates are suggested, possibly pneumonia or edema. No pleural effusions. No pneumothorax. Calcification of the aorta. Degenerative changes in the shoulders, most severe on the right. IMPRESSION: Shallow inspiration. Perihilar infiltrates are suggested, likely pneumonia or edema. Electronically Signed   By: Burman Nieves M.D.   On: 08/19/2022 23:26    Pending Labs Unresulted Labs (From admission, onward)     Start     Ordered   08/20/22 0522  MRSA Next Gen by PCR, Nasal  Once,   URGENT        08/20/22 0534   08/20/22 0308  Blood culture (routine x 2)  BLOOD CULTURE X 2,   R (with STAT occurrences)     Question Answer Comment  Patient immune status Normal   Release to patient Immediate      08/20/22 0307            Vitals/Pain Today's Vitals   08/20/22 0400 08/20/22 0500 08/20/22 0524 08/20/22 0600  BP: (!) 126/53 (!) 109/53  127/74  Pulse: 99 100  91  Resp: 18 18  18   Temp:   98.6 F (37 C)   TempSrc:   Oral   SpO2: 98% 98%  99%  Weight:      Height:      PainSc:  Isolation Precautions No active isolations  Medications Medications   albuterol (PROVENTIL) (2.5 MG/3ML) 0.083% nebulizer solution 2.5 mg (has no administration in time range)  predniSONE (DELTASONE) tablet 5 mg (5 mg Oral Given 08/20/22 0715)  ceFEPIme (MAXIPIME) 2 g in sodium chloride 0.9 % 100 mL IVPB (has no administration in time range)  vancomycin (VANCOCIN) IVPB 1000 mg/200 mL premix (has no administration in time range)  ipratropium-albuterol (DUONEB) 0.5-2.5 (3) MG/3ML nebulizer solution 3 mL (3 mLs Nebulization Given 08/20/22 0021)  iohexol (OMNIPAQUE) 350 MG/ML injection 75 mL (75 mLs Intravenous Contrast Given 08/20/22 0130)  vancomycin (VANCOCIN) IVPB 1000 mg/200 mL premix (0 mg Intravenous Stopped 08/20/22 0510)  ceFEPIme (MAXIPIME) 2 g in sodium chloride 0.9 % 100 mL IVPB (0 g Intravenous Stopped 08/20/22 0404)    Mobility non-ambulatory

## 2022-08-20 NOTE — ED Notes (Signed)
Patient transported to CT 

## 2022-08-20 NOTE — ED Provider Notes (Addendum)
[facility] AT [facility] Provider Note   CSN: [id number] Arrival date & time: 08/19/22  2222     History  Chief Complaint  Patient presents with   Cough    Desiree [name] is a 77 y.o. female.  The history is provided by the patient.  Cough Cough characteristics:  Productive Sputum characteristics:  Nondescript Severity:  Moderate Onset quality:  Gradual Duration:  1 week Timing:  Intermittent Progression:  Worsening Chronicity:  New Context: upper respiratory infection   Relieved by:  Nothing Worsened by:  Nothing Ineffective treatments:  None tried Associated symptoms: shortness of breath and wheezing   Associated symptoms: no chest pain and no fever   Associated symptoms comment:  Fatigue  Risk factors: no recent travel   Patient with complex past medical history including recent radiation for lung cancer presents with cough and wheezing and fatigue for one week.      Past Medical History:  Diagnosis Date   Abnormal nuclear stress test    AF (paroxysmal atrial fibrillation) (HCC)    Age-related osteoporosis without current pathological fracture 05/16/2016   Alcoholism (HCC) 10/21/2018   Formatting of this note might be different from the original. 2 glasses a day.   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 Wrist surgery   Benign paroxysmal positional vertigo due to bilateral vestibular disorder 02/09/2020   Cancer of ascending colon [facility])    Carcinoma of ascending colon (HCC)    Cat allergies    Centrilobular emphysema (HCC) 05/16/2016   Chronic anemia 04/09/2021   Chronic atrial fibrillation, unspecified (HCC) 10/04/2020   Chronic obstructive pulmonary disease (HCC) 04/09/2021   Chronic pain 04/09/2021   CKD (chronic kidney disease) stage 3, GFR 30-59 ml/min  (HCC) 01/04/2018   COPD (chronic obstructive pulmonary disease) (HCC)    Cysts of both ovaries 11/24/2019   Formatting of this note might be different from the original. On CT 11/2019. Referred to GYN.   Dyspnea    Early satiety 04/14/2019   Essential hypertension 08/22/2010   Formatting of this note might be different from the original. Formatting of this note might be different from the original. D/C ACE 08/22/2010 due to concerns with freq aecopd/ pseudoasthma > resolved  Last Assessment & Plan:  Formatting of this note might be different from the original. Adequate control on present rx, reviewed - note she's no longer falling apart during her aecopd's which are less   Gastro-esophageal reflux disease without esophagitis 07/14/2017   Gastrointestinal hemorrhage    History of pulmonary embolus (PE) 07/14/2017   History of radiation therapy    Left lung- SBRT 11/13/20-11/27/20- Dr. Antony Blackbird   History of radiation therapy    Right and Left Lung 06/16/22-06/27/22- Dr. 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 Possible???? See CT comments 11/2019.   Other vitamin B12 deficiency anemias 04/09/2021    Paroxysmal atrial fibrillation (HCC) 04/14/2019   Formatting of this note might be different from the original. [facility].   Prediabetes 07/14/2017   Pulmonary embolism (HCC) 2017   Rheumatoid arthritis (HCC) 09/20/2010   Dr Nickola Major in [place].   Rheumatoid arthritis(714.0)    Dr Azzie Roup follows   Schatzki's ring    Senile purpura (HCC) 01/04/2018   Skin rash 07/14/2017   Bx'd Allergic. Likely meds.   Status post thoracentesis    Symptomatic anemia 07/25/2020   Type 2 diabetes mellitus without complications (HCC) 04/09/2021   Vitamin B12 deficiency 07/14/2017   Vitamin D deficiency 04/09/2021     Home Medications Prior to Admission medications   Medication Sig Start Date End Date Taking? Authorizing Provider  acetaminophen (TYLENOL) 325 MG tablet Take 325 mg by mouth every 6 (six) hours as needed for moderate pain or headache.   Yes [provider]  albuterol (VENTOLIN HFA) 108 (90 Base) MCG/ACT inhaler Inhale 2 puffs into the lungs every 4 (four) hours as needed for shortness of breath (shortness of breath (related to COPD)).   Yes [provider]  amLODipine (NORVASC) 5 MG tablet Take 1 tablet (5 mg total) by mouth daily. Patient needs appointment for further refills. 1 st attempt 10/02/20  Yes Tobb, Kardie, DO  aspirin EC 81 MG tablet Take 81 mg by mouth daily.   Yes [provider]  atorvastatin (LIPITOR) 20 MG tablet Take 1 tablet (20 mg total) by mouth daily. Patient needs to keep appointment for further refills. 1 st attempt Patient taking differently: Take 20 mg by mouth every evening. Patient needs to keep appointment for further refills. 1 st attempt 10/05/20  Yes Tobb, Kardie, DO  Budeson-Glycopyrrol-Formoterol (BREZTRI AEROSPHERE) 160-9-4.8 MCG/ACT AERO Inhale 2 puffs into the lungs 2 (two) times daily. 01/19/20  Yes Icard, Rachel Bo, DO  carvedilol (COREG) 3.125 MG tablet Take 1 tablet (3.125 mg total) by mouth 2 (two) times daily with a  meal. 08/13/20  Yes Tobb, Kardie, DO  Cholecalciferol (D3-1000) 25 MCG (1000 UT) capsule Take 1,000 Units by mouth daily.   Yes [provider]  Ferrous Sulfate (IRON) 325 (65 Fe) MG TABS Take 1 tablet by mouth daily. 10/08/21  Yes [provider]  fexofenadine (ALLEGRA) 180 MG tablet Take 180 mg by mouth daily.   Yes [provider]  gabapentin (NEURONTIN) 100 MG capsule Take 100 mg by mouth 2 (two) times daily. 10/01/21  Yes [provider]  hydroxychloroquine (PLAQUENIL) 200 MG tablet Take 200 mg by mouth daily. 09/17/15  Yes [provider]  leflunomide (ARAVA) 10 MG tablet Take 10 mg by mouth daily. 04/05/21  Yes [provider]  loperamide (IMODIUM A-D) 2 MG tablet Take 2 mg by mouth as needed for diarrhea or loose stools (MAX 10 PILLS/24 HRS.).   Yes [provider]  meclizine (ANTIVERT) 12.5 MG tablet Take 12.5 mg by mouth 2 (two) times daily as needed for dizziness. 05/20/22  Yes [provider]  mirtazapine (REMERON) 7.5 MG tablet Take 7.5 mg by mouth at bedtime.   Yes [provider]  montelukast (SINGULAIR) 10 MG  tablet Take 1 tablet (10 mg total) by mouth at bedtime. 10/21/20  Yes Icard, Rachel Bo, DO  omeprazole (PRILOSEC) 20 MG capsule Take 1 capsule (20 mg total) by mouth daily. 07/11/20  Yes Icard, Bradley L, DO  ondansetron (ZOFRAN-ODT) 4 MG disintegrating tablet Take 1 tablet (4 mg total) by mouth every 8 (eight) hours as needed for nausea or vomiting. 08/05/20  Yes Fayette Pho, MD  OYSTERCAL-D 500-10 MG-MCG TABS Take 1 tablet by mouth daily. 03/06/22  Yes [provider]  predniSONE (DELTASONE) 5 MG tablet Take 5 mg by mouth daily with breakfast.   Yes [provider]  Sertraline HCl 150 MG CAPS Take 1 capsule by mouth daily. 04/09/22  Yes [provider]  tiZANidine (ZANAFLEX) 2 MG tablet Take 2 mg by mouth 2 (two) times daily. 09/02/21  Yes [provider]  traMADol (ULTRAM)  50 MG tablet Take 1 tablet (50 mg total) by mouth 3 (three) times daily as needed for moderate pain (pain). 06/18/22  Yes Burnadette Pop, MD  valsartan (DIOVAN) 320 MG tablet Take 320 mg by mouth in the morning.   Yes [provider]  vitamin B-12 (CYANOCOBALAMIN) 1000 MCG tablet Take 1,000 mcg by mouth daily.    Yes [provider]      Allergies    Other and Oxycodone    Review of Systems   Review of Systems  Constitutional:  Positive for fatigue. Negative for fever.  HENT:  Negative for facial swelling.   Respiratory:  Positive for cough, shortness of breath and wheezing.   Cardiovascular:  Negative for chest pain and leg swelling.  Gastrointestinal:  Negative for vomiting.  All other systems reviewed and are negative.   Physical Exam Updated Vital Signs BP (!) 126/53   Pulse 99   Temp 98.1 F (36.7 C) (Oral)   Resp 18   Ht 5' (1.524 m)   Wt 46.3 kg   SpO2 98%   BMI 19.92 kg/m  Physical Exam Vitals and nursing note reviewed.  Constitutional:      General: She is not in acute distress.    Appearance: Normal appearance. She is well-developed.  HENT:     Head: Normocephalic and atraumatic.     Nose: Nose normal.  Eyes:     Pupils: Pupils are equal, round, and reactive to light.  Cardiovascular:     Rate and Rhythm: Normal rate and regular rhythm.     Pulses: Normal pulses.     Heart sounds: Normal heart sounds.  Pulmonary:     Effort: Pulmonary effort is normal. Prolonged expiration present. No respiratory distress.     Breath sounds: Decreased air movement present.  Abdominal:     General: Bowel sounds are normal. There is no distension.     Palpations: Abdomen is soft.     Tenderness: There is no abdominal tenderness. There is no guarding or rebound.  Genitourinary:    Vagina: No vaginal discharge.  Musculoskeletal:        General: Normal range of motion.     Cervical back: Neck supple.  Skin:    General: Skin is dry.     Capillary Refill:  Capillary refill takes less than 2 seconds.     Findings: No erythema or rash.  Neurological:     General: No focal deficit present.     Mental Status: She is alert.     Deep Tendon Reflexes: Reflexes normal.  Psychiatric:  Mood and Affect: Mood normal.     ED Results / Procedures / Treatments   Labs (all labs ordered are listed, but only abnormal results are displayed) Results for orders placed or performed during the hospital encounter of 08/19/22  Resp panel by RT-PCR (RSV, Flu A&B, Covid) Anterior Nasal Swab   Specimen: Anterior Nasal Swab  Result Value Ref Range   SARS Coronavirus 2 by RT PCR NEGATIVE NEGATIVE   Influenza A by PCR NEGATIVE NEGATIVE   Influenza B by PCR NEGATIVE NEGATIVE   Resp Syncytial Virus by PCR NEGATIVE NEGATIVE  CBC with Differential  Result Value Ref Range   WBC 12.9 (H) 4.0 - 10.5 K/uL   RBC 3.08 (L) 3.87 - 5.11 MIL/uL   Hemoglobin 9.6 (L) 12.0 - 15.0 g/dL   HCT 96.0 (L) 45.4 - 09.8 %   MCV 102.3 (H) 80.0 - 100.0 fL   MCH 31.2 26.0 - 34.0 pg   MCHC 30.5 30.0 - 36.0 g/dL   RDW 11.9 14.7 - 82.9 %   Platelets 287 150 - 400 K/uL   nRBC 0.0 0.0 - 0.2 %   Neutrophils Relative % 78 %   Neutro Abs 10.3 (H) 1.7 - 7.7 K/uL   Lymphocytes Relative 6 %   Lymphs Abs 0.8 0.7 - 4.0 K/uL   Monocytes Relative 11 %   Monocytes Absolute 1.4 (H) 0.1 - 1.0 K/uL   Eosinophils Relative 3 %   Eosinophils Absolute 0.4 0.0 - 0.5 K/uL   Basophils Relative 1 %   Basophils Absolute 0.1 0.0 - 0.1 K/uL   Immature Granulocytes 1 %   Abs Immature Granulocytes 0.06 0.00 - 0.07 K/uL  Basic metabolic panel  Result Value Ref Range   Sodium 133 (L) 135 - 145 mmol/L   Potassium 4.2 3.5 - 5.1 mmol/L   Chloride 98 98 - 111 mmol/L   CO2 28 22 - 32 mmol/L   Glucose, Bld 101 (H) 70 - 99 mg/dL   BUN 14 8 - 23 mg/dL   Creatinine, Ser 5.62 0.44 - 1.00 mg/dL   Calcium 8.8 (L) 8.9 - 10.3 mg/dL   GFR, Estimated >13 >08 mL/min   Anion gap 7 5 - 15  Brain natriuretic peptide   Result Value Ref Range   B Natriuretic Peptide 159.4 (H) 0.0 - 100.0 pg/mL  I-stat chem 8, ED (not at [facility], [facility] or [facility])  Result Value Ref Range   Sodium 135 135 - 145 mmol/L   Potassium 4.2 3.5 - 5.1 mmol/L   Chloride 98 98 - 111 mmol/L   BUN 14 8 - 23 mg/dL   Creatinine, Ser 6.57 0.44 - 1.00 mg/dL   Glucose, Bld 846 (H) 70 - 99 mg/dL   Calcium, Ion 9.62 (L) 1.15 - 1.40 mmol/L   TCO2 33 (H) 22 - 32 mmol/L   Hemoglobin 9.5 (L) 12.0 - 15.0 g/dL   HCT 95.2 (L) 84.1 - 32.4 %  Troponin I (High Sensitivity)  Result Value Ref Range   Troponin I (High Sensitivity) 21 (H) <18 ng/L  Troponin I (High Sensitivity)  Result Value Ref Range   Troponin I (High Sensitivity) 22 (H) <18 ng/L   CT Angio Chest PE W and/or Wo Contrast  Result Date: 08/20/2022 CLINICAL DATA:  Chronic dyspnea, productive cough. History of recurrent lung cancer, colon cancer, and prior pulmonary embolism. EXAM: CT ANGIOGRAPHY CHEST WITH CONTRAST TECHNIQUE: Multidetector CT imaging of the chest was performed using the standard protocol during bolus administration of  intravenous contrast. Multiplanar CT image reconstructions and MIPs were obtained to evaluate the vascular anatomy. RADIATION DOSE REDUCTION: This exam was performed according to the departmental dose-optimization program which includes automated exposure control, adjustment of the mA and/or kV according to patient size and/or use of iterative reconstruction technique. CONTRAST:  75mL OMNIPAQUE IOHEXOL 350 MG/ML SOLN COMPARISON:  04/15/2022 FINDINGS: Cardiovascular: There is adequate opacification of the pulmonary arterial tree. No intraluminal filling defect identified to suggest acute pulmonary embolism. Central pulmonary arteries are of normal caliber. Moderate multi-vessel coronary artery calcification. Mild global cardiomegaly with left ventricular enlargement. No pericardial effusion. Moderate atherosclerotic calcification within the thoracic aorta. No aortic  aneurysm. Mediastinum/Nodes: No enlarged mediastinal, hilar, or axillary lymph nodes. Thyroid gland, trachea, and esophagus demonstrate no significant findings. Lungs/Pleura: Reticulonodular infiltrate is seen within the right upper lobe and left lower lobe and there is more extensive infiltrate as well as peripheral consolidation within the right lower lobe in keeping with changes of multifocal infection in the acute setting. Superimposed moderate centrilobular emphysema. Scarring is seen within the posterior basal left lower lobe, unchanged, with fiducial marker in place. Stable scarring within the basilar right middle lobe and lower lingula. No pneumothorax or pleural effusion. 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 There is extensive thickening of the peribronchovascular interstitium, likely related to airway inflammation. Upper Abdomen: No acute abnormality. Musculoskeletal: Numerous remote bilateral rib fractures noted. No acute bone abnormality. No lytic or blastic bone lesion Review of the MIP images confirms the above findings. IMPRESSION: 1. No pulmonary embolism. 2. Multifocal infiltrate within the right upper lobe and left lower lobe with more extensive infiltrate and peripheral consolidation within the right lower lobe in keeping with changes of multifocal infection in the acute setting. Superimposed airway inflammation. 3. Moderate multi-vessel coronary artery calcification. Mild global cardiomegaly with left ventricular enlargement. 4. Emphysema. 5. 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 Aortic Atherosclerosis (ICD10-I70.0) and Emphysema (ICD10-J43.9). Electronically Signed   By: Helyn Numbers M.D.   On: 08/20/2022 01:50   DG Chest Port 1 View  Result Date: 08/19/2022 CLINICAL  DATA:  Cough.  Productive cough for 1 week. EXAM: PORTABLE CHEST 1 VIEW COMPARISON:  06/20/2022 FINDINGS: Shallow inspiration. Heart size and pulmonary vascularity appear normal for technique. Perihilar infiltrates are suggested, possibly pneumonia or edema. No pleural effusions. No pneumothorax. Calcification of the aorta. Degenerative changes in the shoulders, most severe on the right. IMPRESSION: Shallow inspiration. Perihilar infiltrates are suggested, likely pneumonia or edema. Electronically Signed   By: Burman Nieves M.D.   On: 08/19/2022 23:26     EKG EKG Interpretation Date/Time:  Tuesday August 19 2022 22:50:55 EDT Ventricular Rate:  112 PR Interval:  157 QRS Duration:  102 QT Interval:  345 QTC Calculation: 471 R Axis:   242  Text Interpretation: Sinus tachycardia Ventricular premature complex LAD, consider left anterior fascicular block Confirmed by Nicanor Alcon, Kahle Mcqueen ([id number]) on 08/20/2022 12:01:00 AM  Radiology CT Angio Chest PE W and/or Wo Contrast  Result Date: 08/20/2022 CLINICAL DATA:  Chronic dyspnea, productive cough. History of recurrent lung cancer, colon cancer, and prior pulmonary embolism. EXAM: CT ANGIOGRAPHY CHEST WITH CONTRAST TECHNIQUE: Multidetector CT imaging of the chest was performed using the standard protocol during bolus administration of intravenous contrast. Multiplanar CT image reconstructions and MIPs were obtained to evaluate the  vascular anatomy. RADIATION DOSE REDUCTION: This exam was performed according to the departmental dose-optimization program which includes automated exposure control, adjustment of the mA and/or kV according to patient size and/or use of iterative reconstruction technique. CONTRAST:  75mL OMNIPAQUE IOHEXOL 350 MG/ML SOLN COMPARISON:  04/15/2022 FINDINGS: Cardiovascular: There is adequate opacification of the pulmonary arterial tree. No intraluminal filling defect identified to suggest acute pulmonary embolism. Central pulmonary arteries  are of normal caliber. Moderate multi-vessel coronary artery calcification. Mild global cardiomegaly with left ventricular enlargement. No pericardial effusion. Moderate atherosclerotic calcification within the thoracic aorta. No aortic aneurysm. Mediastinum/Nodes: No enlarged mediastinal, hilar, or axillary lymph nodes. Thyroid gland, trachea, and esophagus demonstrate no significant findings. Lungs/Pleura: Reticulonodular infiltrate is seen within the right upper lobe and left lower lobe and there is more extensive infiltrate as well as peripheral consolidation within the right lower lobe in keeping with changes of multifocal infection in the acute setting. Superimposed moderate centrilobular emphysema. Scarring is seen within the posterior basal left lower lobe, unchanged, with fiducial marker in place. Stable scarring within the basilar right middle lobe and lower lingula. No pneumothorax or pleural effusion. 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 There is extensive thickening of the peribronchovascular interstitium, likely related to airway inflammation. Upper Abdomen: No acute abnormality. Musculoskeletal: Numerous remote bilateral rib fractures noted. No acute bone abnormality. No lytic or blastic bone lesion Review of the MIP images confirms the above findings. IMPRESSION: 1. No pulmonary embolism. 2. Multifocal infiltrate within the right upper lobe and left lower lobe with more extensive infiltrate and peripheral consolidation within the right lower lobe in keeping with changes of multifocal infection in the acute setting. Superimposed airway inflammation. 3. Moderate multi-vessel coronary artery calcification. Mild global cardiomegaly with left ventricular enlargement. 4. Emphysema. 5. Bronchomalacia with marked narrowing and near coaptation of the right bronchus intermedius and to a lesser  extent, the right mainstem bronchus. Aortic Atherosclerosis (ICD10-I70.0) and Emphysema (ICD10-J43.9). Electronically Signed   By: Helyn Numbers M.D.   On: 08/20/2022 01:50   DG Chest Port 1 View  Result Date: 08/19/2022 CLINICAL DATA:  Cough.  Productive cough for 1 week. EXAM: PORTABLE CHEST 1 VIEW COMPARISON:  06/20/2022 FINDINGS: Shallow inspiration. Heart size and pulmonary vascularity appear normal for technique. Perihilar infiltrates are suggested, possibly pneumonia or edema. No pleural effusions. No pneumothorax. Calcification of the aorta. Degenerative changes in the shoulders, most severe on the right. IMPRESSION: Shallow inspiration. Perihilar infiltrates are suggested, likely pneumonia or edema. Electronically Signed   By: Burman Nieves M.D.   On: 08/19/2022 23:26    Procedures Procedures    Medications Ordered in ED Medications  vancomycin (VANCOCIN) IVPB 1000 mg/200 mL premix (1,000 mg Intravenous New Bag/Given 08/20/22 0408)  ipratropium-albuterol (DUONEB) 0.5-2.5 (3) MG/3ML nebulizer solution 3 mL (3 mLs Nebulization Given 08/20/22 0021)  iohexol (OMNIPAQUE) 350 MG/ML injection 75 mL (75 mLs Intravenous Contrast Given 08/20/22 0130)  ceFEPIme (MAXIPIME) 2 g in sodium chloride 0.9 % 100 mL IVPB (0 g Intravenous Stopped 08/20/22 0404)    ED Course/ Medical Decision Making/ A&P                             Medical Decision Making Patient with new O2 requirement and cough and fatigue x 1 week from nursing home   Amount and/or Complexity of Data Reviewed Independent Historian:  Details: Daughter see above  External Data Reviewed: notes.    Details: Previous notes reviewed  Labs: ordered.    Details: White count elevated 12.9, low hemoglobin 9.6, elevated troponin 21/22, elevated BNP 159.4, negative covid flu and RSV. Low sodium 133, normal potassium 4.2, normal creatinine .44  Radiology: ordered and independent interpretation performed.    Details: PNA by me on  CTA ECG/medicine tests: ordered and independent interpretation performed. Decision-making details documented in ED Course. Discussion of management or test interpretation with external provider(s): Dr. Joneen Roach to see for admission   Risk Prescription drug management. Decision regarding hospitalization.    Final Clinical Impression(s) / ED Diagnoses Final diagnoses:  Multifocal pneumonia   The patient appears reasonably stabilized for admission considering the current resources, flow, and capabilities available in the ED at this time, and I doubt any other [facility] requiring further screening and/or treatment in the ED prior to admission.        Koltin Wehmeyer, MD 08/20/22 [phone]

## 2022-08-21 DIAGNOSIS — C3431 Malignant neoplasm of lower lobe, right bronchus or lung: Secondary | ICD-10-CM | POA: Diagnosis not present

## 2022-08-21 DIAGNOSIS — J189 Pneumonia, unspecified organism: Secondary | ICD-10-CM

## 2022-08-21 DIAGNOSIS — I2510 Atherosclerotic heart disease of native coronary artery without angina pectoris: Secondary | ICD-10-CM

## 2022-08-21 DIAGNOSIS — K219 Gastro-esophageal reflux disease without esophagitis: Secondary | ICD-10-CM | POA: Diagnosis not present

## 2022-08-21 DIAGNOSIS — M069 Rheumatoid arthritis, unspecified: Secondary | ICD-10-CM

## 2022-08-21 LAB — COMPREHENSIVE METABOLIC PANEL
ALT: 12 U/L (ref 0–44)
AST: 18 U/L (ref 15–41)
Albumin: 2.9 g/dL — ABNORMAL LOW (ref 3.5–5.0)
Alkaline Phosphatase: 92 U/L (ref 38–126)
Anion gap: 9 (ref 5–15)
BUN: 11 mg/dL (ref 8–23)
CO2: 30 mmol/L (ref 22–32)
Calcium: 9.1 mg/dL (ref 8.9–10.3)
Chloride: 97 mmol/L — ABNORMAL LOW (ref 98–111)
Creatinine, Ser: 0.73 mg/dL (ref 0.44–1.00)
GFR, Estimated: >60 mL/min (ref >60)
Glucose, Bld: 111 mg/dL — ABNORMAL HIGH (ref 70–99)
Potassium: 3.8 mmol/L (ref 3.5–5.1)
Sodium: 136 mmol/L (ref 135–145)
Total Bilirubin: 0.5 mg/dL (ref 0.3–1.2)
Total Protein: 6.6 g/dL (ref 6.5–8.1)

## 2022-08-21 LAB — CBC
HCT: 32.9 % — ABNORMAL LOW (ref 36.0–46.0)
Hemoglobin: 10.2 g/dL — ABNORMAL LOW (ref 12.0–15.0)
MCH: 31 pg (ref 26.0–34.0)
MCHC: 31 g/dL (ref 30.0–36.0)
MCV: 100 fL (ref 80.0–100.0)
Platelets: 270 10*3/uL (ref 150–400)
RBC: 3.29 MIL/uL — ABNORMAL LOW (ref 3.87–5.11)
RDW: 12.9 % (ref 11.5–15.5)
WBC: 13.7 10*3/uL — ABNORMAL HIGH (ref 4.0–10.5)
nRBC: 0 % (ref 0.0–0.2)

## 2022-08-21 LAB — HEMOGLOBIN A1C
Hgb A1c MFr Bld: 4.9 % (ref 4.8–5.6)
Mean Plasma Glucose: 94 mg/dL

## 2022-08-21 MED ORDER — LEVALBUTEROL HCL 1.25 MG/0.5ML IN NEBU
1.2500 mg | INHALATION_SOLUTION | Freq: Three times a day (TID) | RESPIRATORY_TRACT | Status: DC
  Administered 2022-08-22: 1.25 mg via RESPIRATORY_TRACT
  Filled 2022-08-21 (×2): qty 0.5

## 2022-08-21 MED ORDER — IPRATROPIUM BROMIDE 0.02 % IN SOLN
0.5000 mg | Freq: Three times a day (TID) | RESPIRATORY_TRACT | Status: DC
  Administered 2022-08-22: 0.5 mg via RESPIRATORY_TRACT
  Filled 2022-08-21 (×2): qty 2.5

## 2022-08-21 NOTE — Assessment & Plan Note (Addendum)
Repleted. °

## 2022-08-21 NOTE — Progress Notes (Signed)
Chaplain engaged in an initial visit with Desiree [name] and her daughter. Daughter sought prayer for her mom because Desiree [name] had trouble sleeping last night. She voiced that her mom expressed some fear and anxiety last night, as well as possible "hallucinations." Chaplain built rapport and offered prayer over [name]. Chaplain also let Desiree [name] know to tell her nurse if she has trouble sleeping again for additional support if needed.   Chaplain built relationship and offered supportive presence and comfort.   08/21/22 1200  Spiritual Encounters  Type of Visit Initial  Care provided to: Pt and family  Referral source Family  Reason for visit Routine spiritual support

## 2022-08-21 NOTE — Assessment & Plan Note (Signed)
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

## 2022-08-21 NOTE — Assessment & Plan Note (Addendum)
-   just completed radiation treatment with rad onc; last seen 08/04/22 - also follows with Dr. Tonia Brooms; no further bronch/biopsies recommended due to high risk under anesthesia

## 2022-08-21 NOTE — Progress Notes (Signed)
Progress Note    Desiree [name]   JXB:[id number]  DOB: 1945-04-30  DOA: 08/19/2022     1 PCP: Patient, No Pcp Per  Initial CC: weakness, cough  Hospital Course: Desiree [name] is a 77 yo female with PMH SCC (LLL nodule) and RLL nodule (s/p radiation treatment just ended), hx colon cancer s/p hemicolectomy 07/2020. Also PMH chronic O2 3L, afib, BPPV, emphysema, COPD, HTN, hypothyroidism, peripheral neuropathy, CAD, HLD, MDD, osteoporosis, cirrhosis, DMII, RA, PE, anemia.  She resides at assisted living and was noted to be progressively more weak and developing some cough and shortness of breath.  She was referred to the ER for further workup. CTA chest negative for PE and showed multifocal infiltrates involving right upper, left lower, right lower lobes.  She was started on antibiotics and admitted for further workup.  Interval History:  No events overnight.  Resting in bed comfortably when seen this morning.  Still feeling weak and very mild intermittent cough.  Assessment and Plan: * CAP (community acquired pneumonia) - some risk for MDRO given hx left lung cancer (SCC) and ongoing radiation treatments  - CTA chest 7/17: multifocal infiltrates in RUL and LLL with peripheral consolidation and RLL consistent with multifocal PNA - for now will continue with rocephin/azithro; if no improvement will broaden - trend PCT (initial neg) and CBC - on chronic prednisone at home; continue but if clinically worsens may also increase dosing   Lung cancer, lower lobe (HCC) - just completed radiation treatment with rad onc; last seen 08/04/22 - also follows with Dr. 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 involving coaptation of the right bronchus intermedius and right mainstem bronchus  Hypomagnesemia - Replete as needed  Type 2 diabetes mellitus without complications (HCC) - A1c  4.9% on 08/20/23 - diet control   Iron deficiency anemia due to chronic blood loss - Continue ferrous sulfate  Idiopathic peripheral neuropathy - Continue gabapentin  Mild CAD - Continue aspirin, statin, ARB  Rheumatoid arthritis (HCC) - On chronic prednisone 5 mg daily -Also on Plaquenil  Mixed hyperlipidemia - Continue Lipitor  Gastro-esophageal reflux disease without esophagitis - Continue Protonix  Hypertension - Continue amlodipine, Coreg, irbesartan    Old records reviewed in assessment of this patient  Antimicrobials: Vancomycin 08/20/2022 x 1 Cefepime 08/20/2022 x 1 Rocephin 08/20/2022 >> current Azithromycin 08/20/2022 >> current  DVT prophylaxis:  SCDs Start: 08/20/22 0836   Code Status:   Code Status: Full Code  Mobility Assessment (Last 72 Hours)     Mobility Assessment     Row Name 08/20/22 2030           Does patient have an order for bedrest or is patient medically unstable No - Continue assessment       What is the highest level of mobility based on the progressive mobility assessment? Level 3 (Stands with assist) - Balance while standing  and cannot march in place       Is the above level different from baseline mobility prior to current illness? Yes - Recommend PT order                Barriers to discharge: none Disposition Plan:  Home Status is: Inpt  Objective: Blood pressure (!) 147/84, pulse (!) 107, temperature 97.7 F (36.5 C), temperature source Oral, resp. rate (!) 22, height 5' (1.524 m), weight 46.3 kg, SpO2 96%.  Examination:  Physical Exam Constitutional:      General: She is not in acute distress.    Appearance: Normal appearance.  HENT:     Head: Normocephalic and atraumatic.     Mouth/Throat:     Mouth: Mucous membranes are moist.  Eyes:     Extraocular Movements: Extraocular movements intact.  Cardiovascular:     Rate and Rhythm: Regular rhythm.  Pulmonary:     Effort: Pulmonary effort is normal. No  respiratory distress.     Breath sounds: No wheezing.  Abdominal:     General: Bowel sounds are normal. There is no distension.     Palpations: Abdomen is soft.     Tenderness: There is no abdominal tenderness.  Musculoskeletal:        General: Normal range of motion.     Cervical back: Normal range of motion and neck supple.  Skin:    General: Skin is warm and dry.  Neurological:     General: No focal deficit present.     Mental Status: She is alert.  Psychiatric:        Mood and Affect: Mood normal.        Behavior: Behavior normal.      Consultants:    Procedures:    Data Reviewed: Results for orders placed or performed during the hospital encounter of 08/19/22 (from the past 24 hour(s))  Comprehensive metabolic panel     Status: Abnormal   Collection Time: 08/21/22  4:48 AM  Result Value Ref Range   Sodium 136 135 - 145 mmol/L   Potassium 3.8 3.5 - 5.1 mmol/L   Chloride 97 (L) 98 - 111 mmol/L   CO2 30 22 - 32 mmol/L   Glucose, Bld 111 (H) 70 - 99 mg/dL   BUN 11 8 - 23 mg/dL   Creatinine, Ser 1.61 0.44 - 1.00 mg/dL   Calcium 9.1 8.9 - 09.6 mg/dL   Total Protein 6.6 6.5 - 8.1 g/dL   Albumin 2.9 (L) 3.5 - 5.0 g/dL   AST 18 15 - 41 U/L   ALT 12 0 - 44 U/L   Alkaline Phosphatase 92 38 - 126 U/L   Total Bilirubin 0.5 0.3 - 1.2 mg/dL   GFR, Estimated >04 >54 mL/min   Anion gap 9 5 - 15  CBC     Status: Abnormal   Collection Time: 08/21/22  7:57 AM  Result Value Ref Range   WBC 13.7 (H) 4.0 - 10.5 K/uL   RBC 3.29 (L) 3.87 - 5.11 MIL/uL   Hemoglobin 10.2 (L) 12.0 - 15.0 g/dL   HCT 09.8 (L) 11.9 - 14.7 %   MCV 100.0 80.0 - 100.0 fL   MCH 31.0 26.0 - 34.0 pg   MCHC 31.0 30.0 - 36.0 g/dL   RDW 82.9 56.2 - 13.0 %   Platelets 270 150 - 400 K/uL   nRBC 0.0 0.0 - 0.2 %    I have reviewed pertinent nursing notes, vitals, labs, and images as necessary. I have ordered labwork to follow up on as indicated.  I have reviewed the last notes from staff over past 24  hours. I have discussed patient's care plan and test results with nursing staff, CM/SW, and other staff as appropriate.  Time spent: Greater than 50% of the 55 minute visit was spent in counseling/coordination of care for the patient as laid out in the A&P.   LOS: 1 day   Lewie Chamber, MD [facility] 08/21/2022, 3:02 PM

## 2022-08-21 NOTE — Assessment & Plan Note (Addendum)
-   some risk for MDRO given hx left lung cancer (SCC) and ongoing radiation treatments  - CTA chest 7/17: multifocal infiltrates in RUL and LLL with peripheral consolidation and RLL consistent with multifocal PNA - for now will continue with rocephin/azithro; clinically has improved and responded - initial PCT negative - d/c on amox/doxy to finish 5 day course total

## 2022-08-21 NOTE — Assessment & Plan Note (Signed)
-   Continue amlodipine, Coreg, irbesartan

## 2022-08-21 NOTE — Assessment & Plan Note (Signed)
-   On chronic prednisone 5 mg daily -Also on Plaquenil

## 2022-08-21 NOTE — Assessment & Plan Note (Signed)
-   Continue ferrous sulfate 

## 2022-08-21 NOTE — Assessment & Plan Note (Signed)
-   Continue aspirin, statin, ARB

## 2022-08-21 NOTE — Assessment & Plan Note (Signed)
Continue Protonix °

## 2022-08-21 NOTE — Assessment & Plan Note (Addendum)
-  Continue Lipitor °

## 2022-08-21 NOTE — Hospital Course (Addendum)
Desiree [name] is a 77 yo female with PMH SCC (LLL nodule) and RLL nodule (s/p radiation treatment just ended), hx colon cancer s/p hemicolectomy 07/2020. Also PMH chronic O2 3L, afib, BPPV, emphysema, COPD, HTN, hypothyroidism, peripheral neuropathy, CAD, HLD, MDD, osteoporosis, cirrhosis, DMII, RA, PE, anemia.  She resides at assisted living and was noted to be progressively more weak and developing some cough and shortness of breath.  She was referred to the ER for further workup. CTA chest negative for PE and showed multifocal infiltrates involving right upper, left lower, right lower lobes.  She was started on antibiotics and admitted for further workup.

## 2022-08-21 NOTE — Assessment & Plan Note (Signed)
Continue gabapentin.

## 2022-08-21 NOTE — Assessment & Plan Note (Signed)
-   A1c 4.9% on 08/20/23 - diet control

## 2022-08-22 ENCOUNTER — Other Ambulatory Visit (HOSPITAL_COMMUNITY): Payer: Self-pay

## 2022-08-22 DIAGNOSIS — J189 Pneumonia, unspecified organism: Secondary | ICD-10-CM | POA: Diagnosis not present

## 2022-08-22 LAB — PROCALCITONIN: Procalcitonin: <0.1 ng/mL

## 2022-08-22 LAB — BASIC METABOLIC PANEL
Anion gap: 13 (ref 5–15)
BUN: 10 mg/dL (ref 8–23)
CO2: 26 mmol/L (ref 22–32)
Calcium: 8.5 mg/dL — ABNORMAL LOW (ref 8.9–10.3)
Chloride: 99 mmol/L (ref 98–111)
Creatinine, Ser: 0.79 mg/dL (ref 0.44–1.00)
GFR, Estimated: >60 mL/min (ref >60)
Glucose, Bld: 91 mg/dL (ref 70–99)
Potassium: 3.7 mmol/L (ref 3.5–5.1)
Sodium: 138 mmol/L (ref 135–145)

## 2022-08-22 LAB — CBC WITH DIFFERENTIAL/PLATELET
Abs Immature Granulocytes: 0.08 10*3/uL — ABNORMAL HIGH (ref 0.00–0.07)
Basophils Absolute: 0.1 10*3/uL (ref 0.0–0.1)
Basophils Relative: 1 %
Eosinophils Absolute: 0.8 10*3/uL — ABNORMAL HIGH (ref 0.0–0.5)
Eosinophils Relative: 6 %
HCT: 33.1 % — ABNORMAL LOW (ref 36.0–46.0)
Hemoglobin: 10.2 g/dL — ABNORMAL LOW (ref 12.0–15.0)
Immature Granulocytes: 1 %
Lymphocytes Relative: 4 %
Lymphs Abs: 0.5 10*3/uL — ABNORMAL LOW (ref 0.7–4.0)
MCH: 31.1 pg (ref 26.0–34.0)
MCHC: 30.8 g/dL (ref 30.0–36.0)
MCV: 100.9 fL — ABNORMAL HIGH (ref 80.0–100.0)
Monocytes Absolute: 1.2 10*3/uL — ABNORMAL HIGH (ref 0.1–1.0)
Monocytes Relative: 10 %
Neutro Abs: 9.2 10*3/uL — ABNORMAL HIGH (ref 1.7–7.7)
Neutrophils Relative %: 78 %
Platelets: 310 10*3/uL (ref 150–400)
RBC: 3.28 MIL/uL — ABNORMAL LOW (ref 3.87–5.11)
RDW: 12.8 % (ref 11.5–15.5)
WBC: 11.8 10*3/uL — ABNORMAL HIGH (ref 4.0–10.5)
nRBC: 0 % (ref 0.0–0.2)

## 2022-08-22 LAB — MAGNESIUM: Magnesium: 1.9 mg/dL (ref 1.7–2.4)

## 2022-08-22 MED ORDER — AMOXICILLIN 500 MG PO CAPS: 500.0000 mg | ORAL_CAPSULE | Freq: Three times a day (TID) | ORAL | Status: DC

## 2022-08-22 MED ORDER — DOXYCYCLINE HYCLATE 100 MG PO TABS: 100.0000 mg | ORAL_TABLET | Freq: Two times a day (BID) | ORAL | Status: DC

## 2022-08-22 MED ORDER — DOXYCYCLINE HYCLATE 100 MG PO TABS: 100.0000 mg | ORAL_TABLET | Freq: Two times a day (BID) | ORAL | 0 refills | Status: DC

## 2022-08-22 MED ORDER — AMOXICILLIN 500 MG PO CAPS: 500.0000 mg | ORAL_CAPSULE | Freq: Three times a day (TID) | ORAL | 0 refills | Status: DC

## 2022-08-22 MED ORDER — DOXYCYCLINE HYCLATE 100 MG PO TABS
100.0000 mg | ORAL_TABLET | Freq: Two times a day (BID) | ORAL | 0 refills | Status: DC
  Filled 2022-08-22: qty 6, 3d supply, fill #0

## 2022-08-22 MED ORDER — AMOXICILLIN 500 MG PO CAPS
500.0000 mg | ORAL_CAPSULE | Freq: Three times a day (TID) | ORAL | 0 refills | Status: DC
  Filled 2022-08-22: qty 9, 3d supply, fill #0

## 2022-08-22 NOTE — TOC Initial Note (Addendum)
Transition of Care [facility]) - Initial/Assessment Note    Patient Details  Name: Desiree [name] MRN: [id number] Date of Birth: [birth year]  [facility] CM/SW Contact:    Larrie Kass, LCSW Phone Number: 08/22/2022, 9:13 AM  Clinical Narrative:                   CSW spoke with pt's daughter Neysa Bonito pt is from [place] assisted living. Pt's daughter reports pt has a walker and wheelchair, no DME needs. Pt's daughter is requesting EMS transport at the time of d/c. CSW spoke with Amanda Cockayne, pt can return at d/c will need updated Fl2, MAR, H&P, and d/c summary. TOC to follow.    ADDEN 11:00am  Pt's meds to be sent to [facility], call report to [phone], [facility] called. No further TOC needs TOC sign off.     Barriers to Discharge: Continued Medical Work up   Patient Goals and CMS Choice            Expected Discharge Plan and Services                                              Prior Living Arrangements/Services                       Activities of Daily Living Home Assistive Devices/Equipment: Environmental consultant (specify type) ADL Screening (condition at time of admission) Patient's cognitive ability adequate to safely complete daily activities?: Yes Is the patient deaf or have difficulty hearing?: No Does the patient have difficulty seeing, even when wearing glasses/contacts?: No Does the patient have difficulty concentrating, remembering, or making decisions?: No Patient able to express need for assistance with ADLs?: Yes Does the patient have difficulty dressing or bathing?: Yes Independently performs ADLs?: No Communication: Independent Dressing (OT): Needs assistance Is this a change from baseline?: Pre-admission baseline Grooming: Needs assistance Is this a change from baseline?: Pre-admission baseline Feeding: Needs assistance Is this a change from baseline?: Pre-admission baseline Bathing: Needs assistance Is  this a change from baseline?: Pre-admission baseline Toileting: Needs assistance Is this a change from baseline?: Pre-admission baseline In/Out Bed: Needs assistance Is this a change from baseline?: Pre-admission baseline Walks in Home: Needs assistance Is this a change from baseline?: Pre-admission baseline Does the patient have difficulty walking or climbing stairs?: Yes Weakness of Legs: Both Weakness of Arms/Hands: Both  Permission Sought/Granted                  Emotional Assessment              Admission diagnosis:  Multifocal pneumonia [J18.9] Patient Active Problem List   Diagnosis Date Noted   CAP (community acquired pneumonia) 08/20/2022   Hypomagnesemia 08/20/2022   UTI (urinary tract infection) 06/15/2022   AF (paroxysmal atrial fibrillation) (HCC) 10/10/2021   History of radiation therapy 04/09/2021   Chronic pain 04/09/2021   Hypothyroidism 04/09/2021   Type 2 diabetes mellitus without complications (HCC) 04/09/2021   Vitamin D deficiency 04/09/2021   Malignant tumor of ascending colon (HCC) 04/09/2021   Chronic obstructive pulmonary disease (HCC) 04/09/2021   Malignant neoplasm of unspecified part of left bronchus or lung (HCC) 04/09/2021   Atherosclerotic heart disease of native coronary artery without angina pectoris 04/09/2021   Chronic anemia 04/09/2021   Other vitamin B12 deficiency anemias  04/09/2021   Chronic atrial fibrillation, unspecified (HCC) 10/04/2020   Carcinoma of ascending colon (HCC) 10/04/2020   Dyspnea 10/04/2020   Lung cancer, lower lobe (HCC) 09/24/2020   Lung nodule, solitary 08/16/2020   Status post thoracentesis    Schatzki's ring    Cancer of ascending colon (HCC)    Gastrointestinal hemorrhage    Cat allergies 07/26/2020   Symptomatic anemia 07/25/2020   Idiopathic peripheral neuropathy 02/23/2020   Benign paroxysmal positional vertigo due to bilateral vestibular disorder 02/09/2020   Cysts of both ovaries 11/24/2019    Other cirrhosis of liver (HCC) 11/24/2019   Early satiety 04/14/2019   Paroxysmal atrial fibrillation (HCC) 04/14/2019   Mild CAD 02/02/2019   Abnormal nuclear stress test    Iron deficiency anemia due to chronic blood loss 10/27/2018   Alcoholism (HCC) 10/21/2018   CKD (chronic kidney disease) stage 3, GFR 30-59 ml/min (HCC) 01/04/2018   Senile purpura (HCC) 01/04/2018   At high risk for falls 07/14/2017   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HCC) 07/14/2017   Gastro-esophageal reflux disease without esophagitis 07/14/2017   History of pulmonary embolus (PE) 07/14/2017   Malaise and fatigue 07/14/2017   Mild episode of recurrent major depressive disorder (HCC) 07/14/2017   Mixed hyperlipidemia 07/14/2017   Prediabetes 07/14/2017   Skin rash 07/14/2017   Vitamin B12 deficiency 07/14/2017   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 [place] [address] Phone: [phone] Fax: [phone]     Social Determinants of Health (SDOH) Social History: SDOH Screenings   Food Insecurity: No Food Insecurity (08/20/2022)  Housing: Low Risk  (08/20/2022)  Transportation Needs: No Transportation Needs (08/20/2022)  Utilities: Not At Risk (08/20/2022)  Financial Resource Strain: Low Risk  (10/26/2020)  Social Connections: Socially Isolated (10/26/2020)  Tobacco Use: Medium Risk (08/19/2022)    SDOH Interventions:     Readmission Risk Interventions    06/17/2022   12:54 PM  Readmission Risk Prevention Plan  Transportation Screening Complete  PCP or Specialist Appt within 5-7 Days Complete  Home Care Screening Complete

## 2022-08-22 NOTE — Final Progress Note (Signed)
Report called to Vernona Rieger, RN to give report on patient. All questions answered regarding patient care and new antibiotics. PIV removed. Awaiting transport by [facility].

## 2022-08-22 NOTE — Discharge Summary (Signed)
Physician Discharge Summary   Desiree [name] GUR:[id number] DOB: 1945-08-29 DOA: 08/19/2022  PCP: Patient, No Pcp Per  Admit date: 08/19/2022 Discharge date:  08/22/2022  Admitted From: [facility] Disposition:  [place] Discharging physician: Lewie Chamber, MD Barriers to discharge: none  Recommendations at discharge: Continue chronic medical management   Discharge Condition: stable CODE STATUS: Full Diet recommendation:  Diet Orders (From admission, onward)     Start     Ordered   08/22/22 0000  Diet - low sodium heart healthy        08/22/22 1056   08/20/22 0512  Diet Heart Room service appropriate? Yes; Fluid consistency: Thin  Diet effective now       Question Answer Comment  Room service appropriate? Yes   Fluid consistency: Thin      08/20/22 0511            Hospital Course: Desiree [name] is a 77 yo female with PMH SCC (LLL nodule) and RLL nodule (s/p radiation treatment just ended), hx colon cancer s/p hemicolectomy 07/2020. Also PMH chronic O2 3L, afib, BPPV, emphysema, COPD, HTN, hypothyroidism, peripheral neuropathy, CAD, HLD, MDD, osteoporosis, cirrhosis, DMII, RA, PE, anemia.  She resides at assisted living and was noted to be progressively more weak and developing some cough and shortness of breath.  She was referred to the ER for further workup. CTA chest negative for PE and showed multifocal infiltrates involving right upper, left lower, right lower lobes.  She was started on antibiotics and admitted for further workup.  Assessment and Plan: * CAP (community acquired pneumonia) - some risk for MDRO given hx left lung cancer (SCC) and ongoing radiation treatments  - CTA chest 7/17: multifocal infiltrates in RUL and LLL with peripheral consolidation and RLL consistent with multifocal PNA - for now will continue with rocephin/azithro; clinically has improved and responded - initial PCT negative - d/c on amox/doxy to finish 5 day course total   Lung cancer,  lower lobe (HCC) - just completed radiation treatment with rad onc; last seen 08/04/22 - also follows with Dr. 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 involving coaptation of the right bronchus intermedius and right mainstem bronchus  Hypomagnesemia - Repleted  Type 2 diabetes mellitus without complications (HCC) - A1c 4.9% on 08/20/23 - diet control   Iron deficiency anemia due to chronic blood loss - Continue ferrous sulfate  Idiopathic peripheral neuropathy - Continue gabapentin  Mild CAD - Continue aspirin, statin, ARB  Rheumatoid arthritis (HCC) - On chronic prednisone 5 mg daily -Also on Plaquenil  Mixed hyperlipidemia - Continue Lipitor  Gastro-esophageal reflux disease without esophagitis - Continue Protonix  Hypertension - Continue amlodipine, Coreg, irbesartan    Principal Diagnosis: CAP (community acquired pneumonia)  Discharge Diagnoses: Active Hospital Problems   Diagnosis Date Noted   CAP (community acquired pneumonia) 08/20/2022    Priority: 1.   Lung cancer, lower lobe (HCC) 09/24/2020    Priority: 2.   Centrilobular emphysema (HCC) 05/16/2016    Priority: 3.   Hypomagnesemia 08/20/2022   Type 2 diabetes mellitus without complications (HCC) 04/09/2021   Idiopathic peripheral neuropathy 02/23/2020   Mild CAD 02/02/2019   Iron deficiency anemia due to chronic blood loss 10/27/2018   Gastro-esophageal reflux disease without esophagitis 07/14/2017   Mixed hyperlipidemia 07/14/2017   Rheumatoid arthritis (HCC) 09/20/2010   Hypertension 08/22/2010    Resolved Hospital Problems  No resolved problems  to display.     Discharge Instructions     Diet - low sodium heart healthy   Complete by: As directed    Increase activity slowly   Complete by: As directed       Allergies as of 08/22/2022       Reactions   Other Shortness Of  Breath   Cats   Oxycodone Other (See Comments)   hallucinations        Medication List     TAKE these medications    acetaminophen 325 MG tablet Commonly known as: TYLENOL Take 325 mg by mouth every 6 (six) hours as needed for moderate pain or headache.   albuterol 108 (90 Base) MCG/ACT inhaler Commonly known as: VENTOLIN HFA Inhale 2 puffs into the lungs every 4 (four) hours as needed for shortness of breath (shortness of breath (related to COPD)).   amLODipine 5 MG tablet Commonly known as: NORVASC Take 1 tablet (5 mg total) by mouth daily. Patient needs appointment for further refills. 1 st attempt   amoxicillin 500 MG capsule Commonly known as: AMOXIL Take 1 capsule (500 mg total) by mouth 3 (three) times daily for 3 days.   aspirin EC 81 MG tablet Take 81 mg by mouth daily.   atorvastatin 20 MG tablet Commonly known as: LIPITOR Take 1 tablet (20 mg total) by mouth daily. Patient needs to keep appointment for further refills. 1 st attempt What changed: when to take this   Breztri Aerosphere 160-9-4.8 MCG/ACT Aero Generic drug: Budeson-Glycopyrrol-Formoterol Inhale 2 puffs into the lungs 2 (two) times daily.   carvedilol 3.125 MG tablet Commonly known as: COREG Take 1 tablet (3.125 mg total) by mouth 2 (two) times daily with a meal.   cyanocobalamin 1000 MCG tablet Commonly known as: VITAMIN B12 Take 1,000 mcg by mouth daily.   D3-1000 25 MCG (1000 UT) capsule Generic drug: Cholecalciferol Take 1,000 Units by mouth daily.   doxycycline 100 MG tablet Commonly known as: VIBRA-TABS Take 1 tablet (100 mg total) by mouth every 12 (twelve) hours for 3 days.   fexofenadine 180 MG tablet Commonly known as: ALLEGRA Take 180 mg by mouth daily.   gabapentin 100 MG capsule Commonly known as: NEURONTIN Take 100 mg by mouth 2 (two) times daily.   hydroxychloroquine 200 MG tablet Commonly known as: PLAQUENIL Take 200 mg by mouth daily.   Iron 325 (65 Fe) MG  Tabs Take 1 tablet by mouth daily.   leflunomide 10 MG tablet Commonly known as: ARAVA Take 10 mg by mouth daily.   loperamide 2 MG tablet Commonly known as: IMODIUM A-D Take 2 mg by mouth as needed for diarrhea or loose stools (MAX 10 PILLS/24 HRS.).   meclizine 12.5 MG tablet Commonly known as: ANTIVERT Take 12.5 mg by mouth 2 (two) times daily as needed for dizziness.   mirtazapine 7.5 MG tablet Commonly known as: REMERON Take 7.5 mg by mouth at bedtime.   montelukast 10 MG tablet Commonly known as: SINGULAIR Take 1 tablet (10 mg total) by mouth at bedtime.   omeprazole 20 MG capsule Commonly known as: PRILOSEC Take 1 capsule (20 mg total) by mouth daily.   ondansetron 4 MG disintegrating tablet Commonly known as: ZOFRAN-ODT Take 1 tablet (4 mg total) by mouth every 8 (eight) hours as needed for nausea or vomiting.   Oystercal-D 500-10 MG-MCG Tabs Generic drug: Calcium Carb-Cholecalciferol Take 1 tablet by mouth daily.   predniSONE 5 MG tablet Commonly known as: DELTASONE Take  5 mg by mouth daily with breakfast.   Sertraline HCl 150 MG Caps Take 1 capsule by mouth daily.   tiZANidine 2 MG tablet Commonly known as: ZANAFLEX Take 2 mg by mouth 2 (two) times daily.   traMADol 50 MG tablet Commonly known as: ULTRAM Take 1 tablet (50 mg total) by mouth 3 (three) times daily as needed for moderate pain (pain).   valsartan 320 MG tablet Commonly known as: DIOVAN Take 320 mg by mouth in the morning.        Allergies  Allergen Reactions   Other Shortness Of Breath    Cats   Oxycodone Other (See Comments)    hallucinations    Consultations:   Procedures:   Discharge Exam: BP (!) 147/81 (BP Location: Left Arm)   Pulse (!) 122   Temp 97.6 F (36.4 C) (Axillary)   Resp (!) 24   Ht 5' (1.524 m)   Wt 46.3 kg   SpO2 98%   BMI 19.92 kg/m  Physical Exam Constitutional:      General: She is not in acute distress.    Appearance: Normal appearance.   HENT:     Head: Normocephalic and atraumatic.     Mouth/Throat:     Mouth: Mucous membranes are moist.  Eyes:     Extraocular Movements: Extraocular movements intact.  Cardiovascular:     Rate and Rhythm: Regular rhythm.  Pulmonary:     Effort: Pulmonary effort is normal. No respiratory distress.     Breath sounds: No wheezing.  Abdominal:     General: Bowel sounds are normal. There is no distension.     Palpations: Abdomen is soft.     Tenderness: There is no abdominal tenderness.  Musculoskeletal:        General: Normal range of motion.     Cervical back: Normal range of motion and neck supple.  Skin:    General: Skin is warm and dry.  Neurological:     General: No focal deficit present.     Mental Status: She is alert.  Psychiatric:        Mood and Affect: Mood normal.        Behavior: Behavior normal.      The results of significant diagnostics from this hospitalization (including imaging, microbiology, ancillary and laboratory) are listed below for reference.   Microbiology: Recent Results (from the past 240 hour(s))  Resp panel by RT-PCR (RSV, Flu A&B, Covid) Anterior Nasal Swab     Status: None   Collection Time: 08/20/22  2:08 AM   Specimen: Anterior Nasal Swab  Result Value Ref Range Status   SARS Coronavirus 2 by RT PCR NEGATIVE NEGATIVE Final    Comment: (NOTE) SARS-CoV-2 target nucleic acids are NOT DETECTED.  The SARS-CoV-2 RNA is generally detectable in upper respiratory specimens during the acute phase of infection. The lowest concentration of SARS-CoV-2 viral copies this assay can detect is 138 copies/mL. A negative result does not preclude SARS-Cov-2 infection and should not be used as the sole basis for treatment or other patient management decisions. A negative result may occur with  improper specimen collection/handling, submission of specimen other than nasopharyngeal swab, presence of viral mutation(s) within the areas targeted by this assay,  and inadequate number of viral copies(<138 copies/mL). A negative result must be combined with clinical observations, patient history, and epidemiological information. The expected result is Negative.  Fact Sheet for Patients:  BloggerCourse.com  Fact Sheet for Healthcare Providers:  SeriousBroker.it  This  test is no t yet approved or cleared by the [place] and  has been authorized for detection and/or diagnosis of SARS-CoV-2 by FDA under an Emergency Use Authorization (EUA). This EUA will remain  in effect (meaning this test can be used) for the duration of the COVID-19 declaration under Section 564(b)(1) of the Act, 21 U.S.C.section 360bbb-3(b)(1), unless the authorization is terminated  or revoked sooner.       Influenza A by PCR NEGATIVE NEGATIVE Final   Influenza B by PCR NEGATIVE NEGATIVE Final    Comment: (NOTE) The Xpert Xpress SARS-CoV-2/FLU/RSV plus assay is intended as an aid in the diagnosis of influenza from Nasopharyngeal swab specimens and should not be used as a sole basis for treatment. Nasal washings and aspirates are unacceptable for Xpert Xpress SARS-CoV-2/FLU/RSV testing.  Fact Sheet for Patients: BloggerCourse.com  Fact Sheet for Healthcare Providers: SeriousBroker.it  This test is not yet approved or cleared by the [place] FDA and has been authorized for detection and/or diagnosis of SARS-CoV-2 by FDA under an Emergency Use Authorization (EUA). This EUA will remain in effect (meaning this test can be used) for the duration of the COVID-19 declaration under Section 564(b)(1) of the Act, 21 U.S.C. section 360bbb-3(b)(1), unless the authorization is terminated or revoked.     Resp Syncytial Virus by PCR NEGATIVE NEGATIVE Final    Comment: (NOTE) Fact Sheet for Patients: BloggerCourse.com  Fact Sheet for  Healthcare Providers: SeriousBroker.it  This test is not yet approved or cleared by the [place] FDA and has been authorized for detection and/or diagnosis of SARS-CoV-2 by FDA under an Emergency Use Authorization (EUA). This EUA will remain in effect (meaning this test can be used) for the duration of the COVID-19 declaration under Section 564(b)(1) of the Act, 21 U.S.C. section 360bbb-3(b)(1), unless the authorization is terminated or revoked.  Performed at [facility], [address], [place], [place] [address]   Blood culture (routine x 2)     Status: None (Preliminary result)   Collection Time: 08/20/22  3:24 AM   Specimen: BLOOD  Result Value Ref Range Status   Specimen Description   Final    BLOOD LEFT ANTECUBITAL Performed at [facility], [address], [place], [place] [address]    Special Requests   Final    BOTTLES DRAWN AEROBIC ONLY Blood Culture adequate volume Performed at [facility], [address] [place], [place] [address]    Culture   Final    NO GROWTH 2 DAYS Performed at [facility], [address] [place], [place] [address]    Report Status PENDING  Incomplete  Blood culture (routine x 2)     Status: None (Preliminary result)   Collection Time: 08/20/22  3:32 AM   Specimen: BLOOD RIGHT FOREARM  Result Value Ref Range Status   Specimen Description   Final    BLOOD RIGHT FOREARM Performed at [facility], [address] [place], [place] [address]    Special Requests   Final    BOTTLES DRAWN AEROBIC AND ANAEROBIC Blood Culture adequate volume Performed at [facility], [address] [place], [place] [address]    Culture   Final    NO GROWTH 2 DAYS Performed at [facility], [address] [place], [place] [address]    Report Status PENDING  Incomplete  MRSA Next Gen by PCR, Nasal     Status: None  Collection Time:  08/20/22  5:22 AM   Specimen: Nasal Mucosa; Nasal Swab  Result Value Ref Range Status   MRSA by PCR Next Gen NOT DETECTED NOT DETECTED Final    Comment: (NOTE) The GeneXpert MRSA Assay (FDA approved for NASAL specimens only), is one component of a comprehensive MRSA colonization surveillance program. It is not intended to diagnose MRSA infection nor to guide or monitor treatment for MRSA infections. Test performance is not FDA approved in patients less than 78 years old. Performed at [facility], [address], [place], [place] [address]      Labs: BNP (last 3 results) Recent Labs    06/15/22 1047 06/20/22 1305 08/19/22 2250  BNP 208.9* 93.5 159.4*   Basic Metabolic Panel: Recent Labs  Lab 08/19/22 2250 08/20/22 0020 08/20/22 0208 08/21/22 0448  NA 133* 135  --  136  K 4.2 4.2  --  3.8  CL 98 98  --  97*  CO2 28  --   --  30  GLUCOSE 101* 103*  --  111*  BUN 14 14  --  11  CREATININE 0.77 0.80  --  0.73  CALCIUM 8.8*  --   --  9.1  MG  --   --  1.6*  --   PHOS  --   --  2.9  --    Liver Function Tests: Recent Labs  Lab 08/21/22 0448  AST 18  ALT 12  ALKPHOS 92  BILITOT 0.5  PROT 6.6  ALBUMIN 2.9*   No results for input(s): "LIPASE", "AMYLASE" in the last 168 hours. No results for input(s): "AMMONIA" in the last 168 hours. CBC: Recent Labs  Lab 08/19/22 2250 08/20/22 0020 08/21/22 0757 08/22/22 0503  WBC 12.9*  --  13.7* 11.8*  NEUTROABS 10.3*  --   --  9.2*  HGB 9.6* 9.5* 10.2* 10.2*  HCT 31.5* 28.0* 32.9* 33.1*  MCV 102.3*  --  100.0 100.9*  PLT 287  --  270 310   Cardiac Enzymes: No results for input(s): "CKTOTAL", "CKMB", "CKMBINDEX", "TROPONINI" in the last 168 hours. BNP: Invalid input(s): "POCBNP" CBG: No results for input(s): "GLUCAP" in the last 168 hours. D-Dimer No results for input(s): "DDIMER" in the last 72 hours. Hgb A1c Recent Labs    08/20/22 1307  HGBA1C 4.9   Lipid Profile No results for  input(s): "CHOL", "HDL", "LDLCALC", "TRIG", "CHOLHDL", "LDLDIRECT" in the last 72 hours. Thyroid function studies No results for input(s): "TSH", "T4TOTAL", "T3FREE", "THYROIDAB" in the last 72 hours.  Invalid input(s): "FREET3" Anemia work up No results for input(s): "VITAMINB12", "FOLATE", "FERRITIN", "TIBC", "IRON", "RETICCTPCT" in the last 72 hours. Urinalysis    Component Value Date/Time   COLORURINE YELLOW 06/15/2022 1205   APPEARANCEUR TURBID (A) 06/15/2022 1205   LABSPEC 1.025 06/15/2022 1205   PHURINE 6.0 06/15/2022 1205   GLUCOSEU NEGATIVE 06/15/2022 1205   HGBUR TRACE (A) 06/15/2022 1205   BILIRUBINUR NEGATIVE 06/15/2022 1205   KETONESUR NEGATIVE 06/15/2022 1205   PROTEINUR TRACE (A) 06/15/2022 1205   UROBILINOGEN 0.2 11/23/2014 1558   NITRITE NEGATIVE 06/15/2022 1205   LEUKOCYTESUR TRACE (A) 06/15/2022 1205   Sepsis Labs Recent Labs  Lab 08/19/22 2250 08/21/22 0757 08/22/22 0503  WBC 12.9* 13.7* 11.8*   Microbiology Recent Results (from the past 240 hour(s))  Resp panel by RT-PCR (RSV, Flu A&B, Covid) Anterior Nasal Swab     Status: None   Collection Time: 08/20/22  2:08 AM   Specimen:  Anterior Nasal Swab  Result Value Ref Range Status   SARS Coronavirus 2 by RT PCR NEGATIVE NEGATIVE Final    Comment: (NOTE) SARS-CoV-2 target nucleic acids are NOT DETECTED.  The SARS-CoV-2 RNA is generally detectable in upper respiratory specimens during the acute phase of infection. The lowest concentration of SARS-CoV-2 viral copies this assay can detect is 138 copies/mL. A negative result does not preclude SARS-Cov-2 infection and should not be used as the sole basis for treatment or other patient management decisions. A negative result may occur with  improper specimen collection/handling, submission of specimen other than nasopharyngeal swab, presence of viral mutation(s) within the areas targeted by this assay, and inadequate number of viral copies(<138 copies/mL).  A negative result must be combined with clinical observations, patient history, and epidemiological information. The expected result is Negative.  Fact Sheet for Patients:  BloggerCourse.com  Fact Sheet for Healthcare Providers:  SeriousBroker.it  This test is no t yet approved or cleared by the [place] FDA and  has been authorized for detection and/or diagnosis of SARS-CoV-2 by FDA under an Emergency Use Authorization (EUA). This EUA will remain  in effect (meaning this test can be used) for the duration of the COVID-19 declaration under Section 564(b)(1) of the Act, 21 U.S.C.section 360bbb-3(b)(1), unless the authorization is terminated  or revoked sooner.       Influenza A by PCR NEGATIVE NEGATIVE Final   Influenza B by PCR NEGATIVE NEGATIVE Final    Comment: (NOTE) The Xpert Xpress SARS-CoV-2/FLU/RSV plus assay is intended as an aid in the diagnosis of influenza from Nasopharyngeal swab specimens and should not be used as a sole basis for treatment. Nasal washings and aspirates are unacceptable for Xpert Xpress SARS-CoV-2/FLU/RSV testing.  Fact Sheet for Patients: BloggerCourse.com  Fact Sheet for Healthcare Providers: SeriousBroker.it  This test is not yet approved or cleared by the [place] FDA and has been authorized for detection and/or diagnosis of SARS-CoV-2 by FDA under an Emergency Use Authorization (EUA). This EUA will remain in effect (meaning this test can be used) for the duration of the COVID-19 declaration under Section 564(b)(1) of the Act, 21 U.S.C. section 360bbb-3(b)(1), unless the authorization is terminated or revoked.     Resp Syncytial Virus by PCR NEGATIVE NEGATIVE Final    Comment: (NOTE) Fact Sheet for Patients: BloggerCourse.com  Fact Sheet for Healthcare  Providers: SeriousBroker.it  This test is not yet approved or cleared by the [place] FDA and has been authorized for detection and/or diagnosis of SARS-CoV-2 by FDA under an Emergency Use Authorization (EUA). This EUA will remain in effect (meaning this test can be used) for the duration of the COVID-19 declaration under Section 564(b)(1) of the Act, 21 U.S.C. section 360bbb-3(b)(1), unless the authorization is terminated or revoked.  Performed at [facility], [address], [place], [place] [address]   Blood culture (routine x 2)     Status: None (Preliminary result)   Collection Time: 08/20/22  3:24 AM   Specimen: BLOOD  Result Value Ref Range Status   Specimen Description   Final    BLOOD LEFT ANTECUBITAL Performed at [facility], [address] [place], [place] [address]    Special Requests   Final    BOTTLES DRAWN AEROBIC ONLY Blood Culture adequate volume Performed at [facility], [address], [place], [place] [address]    Culture   Final    NO GROWTH 2 DAYS Performed at [facility], [address], [place], [place] [address]    Report Status PENDING  Incomplete  Blood culture (routine x 2)     Status: None (Preliminary result)   Collection Time: 08/20/22  3:32 AM   Specimen: BLOOD RIGHT FOREARM  Result Value Ref Range Status   Specimen Description   Final    BLOOD RIGHT FOREARM Performed at [facility], [address] [place], [place] [address]    Special Requests   Final    BOTTLES DRAWN AEROBIC AND ANAEROBIC Blood Culture adequate volume Performed at [facility], [address] [place], [place] [address]    Culture   Final    NO GROWTH 2 DAYS Performed at [facility], [address] [place], [place] [address]    Report Status PENDING  Incomplete  MRSA Next Gen by PCR, Nasal     Status: None   Collection Time: 08/20/22   5:22 AM   Specimen: Nasal Mucosa; Nasal Swab  Result Value Ref Range Status   MRSA by PCR Next Gen NOT DETECTED NOT DETECTED Final    Comment: (NOTE) The GeneXpert MRSA Assay (FDA approved for NASAL specimens only), is one component of a comprehensive MRSA colonization surveillance program. It is not intended to diagnose MRSA infection nor to guide or monitor treatment for MRSA infections. Test performance is not FDA approved in patients less than 34 years old. Performed at [facility], [address], [place], [place] [address]     Procedures/Studies: CT Angio Chest PE W and/or Wo Contrast  Result Date: 08/20/2022 CLINICAL DATA:  Chronic dyspnea, productive cough. History of recurrent lung cancer, colon cancer, and prior pulmonary embolism. EXAM: CT ANGIOGRAPHY CHEST WITH CONTRAST TECHNIQUE: Multidetector CT imaging of the chest was performed using the standard protocol during bolus administration of intravenous contrast. Multiplanar CT image reconstructions and MIPs were obtained to evaluate the vascular anatomy. RADIATION DOSE REDUCTION: This exam was performed according to the departmental dose-optimization program which includes automated exposure control, adjustment of the mA and/or kV according to patient size and/or use of iterative reconstruction technique. CONTRAST:  75mL OMNIPAQUE IOHEXOL 350 MG/ML SOLN COMPARISON:  04/15/2022 FINDINGS: Cardiovascular: There is adequate opacification of the pulmonary arterial tree. No intraluminal filling defect identified to suggest acute pulmonary embolism. Central pulmonary arteries are of normal caliber. Moderate multi-vessel coronary artery calcification. Mild global cardiomegaly with left ventricular enlargement. No pericardial effusion. Moderate atherosclerotic calcification within the thoracic aorta. No aortic aneurysm. Mediastinum/Nodes: No enlarged mediastinal, hilar, or axillary lymph nodes. Thyroid gland, trachea, and  esophagus demonstrate no significant findings. Lungs/Pleura: Reticulonodular infiltrate is seen within the right upper lobe and left lower lobe and there is more extensive infiltrate as well as peripheral consolidation within the right lower lobe in keeping with changes of multifocal infection in the acute setting. Superimposed moderate centrilobular emphysema. Scarring is seen within the posterior basal left lower lobe, unchanged, with fiducial marker in place. Stable scarring within the basilar right middle lobe and lower lingula. No pneumothorax or pleural effusion. 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 There is extensive thickening of the peribronchovascular interstitium, likely related to airway inflammation. Upper Abdomen: No acute abnormality. Musculoskeletal: Numerous remote bilateral rib fractures noted. No acute bone abnormality. No lytic or blastic bone lesion Review of the MIP images confirms the above findings. IMPRESSION: 1. No pulmonary embolism. 2.  Multifocal infiltrate within the right upper lobe and left lower lobe with more extensive infiltrate and peripheral consolidation within the right lower lobe in keeping with changes of multifocal infection in the acute setting. Superimposed airway inflammation. 3. Moderate multi-vessel coronary artery calcification. Mild global cardiomegaly with left ventricular enlargement. 4. Emphysema. 5. 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 Aortic Atherosclerosis (ICD10-I70.0) and Emphysema (ICD10-J43.9). Electronically Signed   By: Helyn Numbers M.D.   On: 08/20/2022 01:50   DG Chest Port 1 View  Result Date: 08/19/2022 CLINICAL DATA:  Cough.  Productive cough for 1 week. EXAM: PORTABLE CHEST 1 VIEW COMPARISON:  06/20/2022 FINDINGS: Shallow  inspiration. Heart size and pulmonary vascularity appear normal for technique. Perihilar infiltrates are suggested, possibly pneumonia or edema. No pleural effusions. No pneumothorax. Calcification of the aorta. Degenerative changes in the shoulders, most severe on the right. IMPRESSION: Shallow inspiration. Perihilar infiltrates are suggested, likely pneumonia or edema. Electronically Signed   By: Burman Nieves M.D.   On: 08/19/2022 23:26     Time coordinating discharge: Over 30 minutes    Lewie Chamber, MD  [facility] 08/22/2022, 10:58 AM

## 2022-08-22 NOTE — [place] FL2 (Signed)
[place] MEDICAID FL2 LEVEL OF CARE FORM     IDENTIFICATION  Patient Name: Desiree [name] Birthdate: 29-Oct-1945 Sex: female Admission Date (Current Location): 08/19/2022  [facility] and [place] Number:  Producer, television/film/video and Address:  [facility],  [address] [place], [place] [address]      Provider Number: [id number]  Attending Physician Name and Address:  Lewie Chamber, MD  Relative Name and Phone Number:  Heider,Cristy (Daughter)  [phone] (Home Phone)    Current Level of Care: Hospital Recommended Level of Care: Assisted Living Facility Prior Approval Number:    Date Approved/Denied:   PASRR Number:    Discharge Plan: Other (Comment) (ALF)    Current Diagnoses: Patient Active Problem List   Diagnosis Date Noted   CAP (community acquired pneumonia) 08/20/2022   Hypomagnesemia 08/20/2022   UTI (urinary tract infection) 06/15/2022   AF (paroxysmal atrial fibrillation) (HCC) 10/10/2021   History of radiation therapy 04/09/2021   Chronic pain 04/09/2021   Hypothyroidism 04/09/2021   Type 2 diabetes mellitus without complications (HCC) 04/09/2021   Vitamin D deficiency 04/09/2021   Malignant tumor of ascending colon (HCC) 04/09/2021   Chronic obstructive pulmonary disease (HCC) 04/09/2021   Malignant neoplasm of unspecified part of left bronchus or lung (HCC) 04/09/2021   Atherosclerotic heart disease of native coronary artery without angina pectoris 04/09/2021   Chronic anemia 04/09/2021   Other vitamin B12 deficiency anemias 04/09/2021   Chronic atrial fibrillation, unspecified (HCC) 10/04/2020   Carcinoma of ascending colon (HCC) 10/04/2020   Dyspnea 10/04/2020   Lung cancer, lower lobe (HCC) 09/24/2020   Lung nodule, solitary 08/16/2020   Status post thoracentesis    Schatzki's ring    Cancer of ascending colon (HCC)    Gastrointestinal hemorrhage    Cat allergies 07/26/2020   Symptomatic anemia 07/25/2020   Idiopathic peripheral neuropathy  02/23/2020   Benign paroxysmal positional vertigo due to bilateral vestibular disorder 02/09/2020   Cysts of both ovaries 11/24/2019   Other cirrhosis of liver (HCC) 11/24/2019   Early satiety 04/14/2019   Paroxysmal atrial fibrillation (HCC) 04/14/2019   Mild CAD 02/02/2019   Abnormal nuclear stress test    Iron deficiency anemia due to chronic blood loss 10/27/2018   Alcoholism (HCC) 10/21/2018   CKD (chronic kidney disease) stage 3, GFR 30-59 ml/min (HCC) 01/04/2018   Senile purpura (HCC) 01/04/2018   At high risk for falls 07/14/2017   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HCC) 07/14/2017   Gastro-esophageal reflux disease without esophagitis 07/14/2017   History of pulmonary embolus (PE) 07/14/2017   Malaise and fatigue 07/14/2017   Mild episode of recurrent major depressive disorder (HCC) 07/14/2017   Mixed hyperlipidemia 07/14/2017   Prediabetes 07/14/2017   Skin rash 07/14/2017   Vitamin B12 deficiency 07/14/2017   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   Multiple lung nodules on CT 05/16/2016   Osteoporosis 05/16/2016   Centrilobular emphysema (HCC) 05/16/2016   Age-related osteoporosis without current pathological fracture 05/16/2016   Allergic rhinitis 07/17/2015   Pulmonary embolism (HCC) 11/23/2014   Rheumatoid arthritis(714.0) 09/20/2010   Rheumatoid arthritis (HCC) 09/20/2010   COPD (chronic obstructive pulmonary disease) (HCC) 08/22/2010   Hypertension 08/22/2010   Essential hypertension 08/22/2010    Orientation RESPIRATION BLADDER Height & Weight     Self, Time, Situation, Place  O2 (3L) Incontinent Weight: 102 lb (46.3 kg) Height:  5' (152.4 cm)  BEHAVIORAL SYMPTOMS/MOOD NEUROLOGICAL BOWEL NUTRITION STATUS      Incontinent Diet (Heart)  AMBULATORY STATUS COMMUNICATION OF NEEDS Skin   Limited Assist Verbally Normal                       Personal Care Assistance Level of Assistance  Bathing, Feeding, Dressing Bathing Assistance:  Limited assistance Feeding assistance: Independent Dressing Assistance: Limited assistance     Functional Limitations Info  Sight, Speech, Hearing Sight Info: Adequate Hearing Info: Adequate Speech Info: Adequate    SPECIAL CARE FACTORS FREQUENCY                       Contractures Contractures Info: Not present    Additional Factors Info  Code Status, Allergies Code Status Info: full Allergies Info: Cats,Oxycodone           Current Medications (08/22/2022):  This is the current hospital active medication list Current Facility-Administered Medications  Medication Dose Route Frequency Provider Last Rate Last Admin   acetaminophen (TYLENOL) tablet 650 mg  650 mg Oral Q6H PRN Bobette Mo, MD       Or   acetaminophen (TYLENOL) suppository 650 mg  650 mg Rectal Q6H PRN Bobette Mo, MD       amLODipine (NORVASC) tablet 5 mg  5 mg Oral Daily Bobette Mo, MD   5 mg at 08/22/22 8119   aspirin EC tablet 81 mg  81 mg Oral Daily Bobette Mo, MD   81 mg at 08/22/22 0941   atorvastatin (LIPITOR) tablet 20 mg  20 mg Oral QPM Bobette Mo, MD   20 mg at 08/21/22 1916   azithromycin (ZITHROMAX) 500 mg in sodium chloride 0.9 % 250 mL IVPB  500 mg Intravenous Q24H Bobette Mo, MD 250 mL/hr at 08/21/22 0931 500 mg at 08/21/22 0931   calcium-vitamin D (OSCAL WITH D) 500-5 MG-MCG per tablet 1 tablet  1 tablet Oral Q breakfast Bobette Mo, MD   1 tablet at 08/22/22 1478   carvedilol (COREG) tablet 3.125 mg  3.125 mg Oral BID WC Bobette Mo, MD   3.125 mg at 08/22/22 2956   cefTRIAXone (ROCEPHIN) 1 g in sodium chloride 0.9 % 100 mL IVPB  1 g Intravenous Q24H Bobette Mo, MD 200 mL/hr at 08/21/22 1347 1 g at 08/21/22 1347   cholecalciferol (VITAMIN D3) tablet 1,000 Units  1,000 Units Oral Daily Bobette Mo, MD   1,000 Units at 08/22/22 0945   cyanocobalamin (VITAMIN B12) tablet 1,000 mcg  1,000 mcg Oral Daily Bobette Mo, MD   1,000 mcg at 08/22/22 0944   ferrous sulfate tablet 325 mg  325 mg Oral Q breakfast Bobette Mo, MD   325 mg at 08/22/22 2130   gabapentin (NEURONTIN) capsule 100 mg  100 mg Oral BID Bobette Mo, MD   100 mg at 08/22/22 0944   hydroxychloroquine (PLAQUENIL) tablet 200 mg  200 mg Oral Daily Bobette Mo, MD   200 mg at 08/21/22 8657   ipratropium (ATROVENT) nebulizer solution 0.5 mg  0.5 mg Nebulization TID Lewie Chamber, MD   0.5 mg at 08/22/22 8469   irbesartan (AVAPRO) tablet 300 mg  300 mg Oral Daily Bobette Mo, MD   300 mg at 08/22/22 6295   leflunomide (ARAVA) tablet 10 mg  10 mg Oral Daily Bobette Mo, MD   10 mg at 08/21/22 0930   levalbuterol (XOPENEX) nebulizer solution 1.25 mg  1.25 mg Nebulization TID Lewie Chamber, MD  1.25 mg at 08/22/22 0738   loratadine (CLARITIN) tablet 10 mg  10 mg Oral Daily Bobette Mo, MD   10 mg at 08/22/22 0945   meclizine (ANTIVERT) tablet 12.5 mg  12.5 mg Oral BID PRN Bobette Mo, MD       mirtazapine (REMERON) tablet 7.5 mg  7.5 mg Oral QHS Bobette Mo, MD   7.5 mg at 08/21/22 2115   montelukast (SINGULAIR) tablet 10 mg  10 mg Oral QHS Bobette Mo, MD   10 mg at 08/21/22 2115   ondansetron [facility]) tablet 4 mg  4 mg Oral Q6H PRN Bobette Mo, MD       Or   ondansetron [facility]) injection 4 mg  4 mg Intravenous Q6H PRN Bobette Mo, MD       pantoprazole (PROTONIX) EC tablet 40 mg  40 mg Oral Daily Bobette Mo, MD   40 mg at 08/22/22 1610   predniSONE (DELTASONE) tablet 5 mg  5 mg Oral Q breakfast Gery Pray, MD   5 mg at 08/22/22 9604   sertraline (ZOLOFT) tablet 150 mg  150 mg Oral Daily Bobette Mo, MD   150 mg at 08/22/22 0943   tiZANidine (ZANAFLEX) tablet 2 mg  2 mg Oral Q8H PRN Bobette Mo, MD       traMADol Janean Sark) tablet 50 mg  50 mg Oral TID PRN Bobette Mo, MD         Discharge Medications: Please  see discharge summary for a list of discharge medications.  Relevant Imaging Results:  Relevant Lab Results:   Additional Information SSN:[id number]  Valentina Shaggy Andy Allende, LCSW

## 2022-08-22 NOTE — Plan of Care (Signed)

## 2022-08-22 NOTE — Final Progress Note (Signed)
[facility] arrived. Patiently safely transported to ED via [facility].

## 2022-08-25 ENCOUNTER — Other Ambulatory Visit: Payer: Self-pay

## 2022-08-25 ENCOUNTER — Encounter (HOSPITAL_COMMUNITY): Payer: Self-pay

## 2022-08-25 ENCOUNTER — Emergency Department (HOSPITAL_COMMUNITY): Payer: Medicare PPO

## 2022-08-25 ENCOUNTER — Inpatient Hospital Stay (HOSPITAL_COMMUNITY)
Admission: EM | Admit: 2022-08-25 | Discharge: 2022-08-29 | DRG: 193 | Disposition: A | Discharge status: Skilled Nursing Facility | Payer: Medicare PPO | Source: Skilled Nursing Facility | Attending: Internal Medicine | Admitting: Internal Medicine

## 2022-08-25 DIAGNOSIS — Z8249 Family history of ischemic heart disease and other diseases of the circulatory system: Secondary | ICD-10-CM

## 2022-08-25 DIAGNOSIS — Z7409 Other reduced mobility: Secondary | ICD-10-CM | POA: Diagnosis present

## 2022-08-25 DIAGNOSIS — L89321 Pressure ulcer of left buttock, stage 1: Secondary | ICD-10-CM | POA: Diagnosis present

## 2022-08-25 DIAGNOSIS — L89311 Pressure ulcer of right buttock, stage 1: Secondary | ICD-10-CM | POA: Diagnosis present

## 2022-08-25 DIAGNOSIS — E440 Moderate protein-calorie malnutrition: Secondary | ICD-10-CM | POA: Diagnosis present

## 2022-08-25 DIAGNOSIS — Z9049 Acquired absence of other specified parts of digestive tract: Secondary | ICD-10-CM

## 2022-08-25 DIAGNOSIS — Z79899 Other long term (current) drug therapy: Secondary | ICD-10-CM

## 2022-08-25 DIAGNOSIS — J432 Centrilobular emphysema: Secondary | ICD-10-CM | POA: Diagnosis present

## 2022-08-25 DIAGNOSIS — Z825 Family history of asthma and other chronic lower respiratory diseases: Secondary | ICD-10-CM

## 2022-08-25 DIAGNOSIS — I2489 Other forms of acute ischemic heart disease: Secondary | ICD-10-CM | POA: Diagnosis present

## 2022-08-25 DIAGNOSIS — M069 Rheumatoid arthritis, unspecified: Secondary | ICD-10-CM | POA: Diagnosis present

## 2022-08-25 DIAGNOSIS — Z9181 History of falling: Secondary | ICD-10-CM

## 2022-08-25 DIAGNOSIS — Z7982 Long term (current) use of aspirin: Secondary | ICD-10-CM

## 2022-08-25 DIAGNOSIS — C3432 Malignant neoplasm of lower lobe, left bronchus or lung: Secondary | ICD-10-CM | POA: Diagnosis present

## 2022-08-25 DIAGNOSIS — Z681 Body mass index (BMI) 19 or less, adult: Secondary | ICD-10-CM | POA: Diagnosis not present

## 2022-08-25 DIAGNOSIS — Z85038 Personal history of other malignant neoplasm of large intestine: Secondary | ICD-10-CM

## 2022-08-25 DIAGNOSIS — Z86711 Personal history of pulmonary embolism: Secondary | ICD-10-CM

## 2022-08-25 DIAGNOSIS — Z66 Do not resuscitate: Secondary | ICD-10-CM | POA: Diagnosis present

## 2022-08-25 DIAGNOSIS — M810 Age-related osteoporosis without current pathological fracture: Secondary | ICD-10-CM | POA: Diagnosis present

## 2022-08-25 DIAGNOSIS — I129 Hypertensive chronic kidney disease with stage 1 through stage 4 chronic kidney disease, or unspecified chronic kidney disease: Secondary | ICD-10-CM | POA: Diagnosis present

## 2022-08-25 DIAGNOSIS — J189 Pneumonia, unspecified organism: Principal | ICD-10-CM | POA: Diagnosis present

## 2022-08-25 DIAGNOSIS — I480 Paroxysmal atrial fibrillation: Secondary | ICD-10-CM | POA: Diagnosis present

## 2022-08-25 DIAGNOSIS — I10 Essential (primary) hypertension: Secondary | ICD-10-CM | POA: Diagnosis present

## 2022-08-25 DIAGNOSIS — N183 Chronic kidney disease, stage 3 unspecified: Secondary | ICD-10-CM | POA: Diagnosis present

## 2022-08-25 DIAGNOSIS — Z87891 Personal history of nicotine dependence: Secondary | ICD-10-CM

## 2022-08-25 DIAGNOSIS — Z1152 Encounter for screening for COVID-19: Secondary | ICD-10-CM

## 2022-08-25 DIAGNOSIS — K219 Gastro-esophageal reflux disease without esophagitis: Secondary | ICD-10-CM | POA: Diagnosis present

## 2022-08-25 DIAGNOSIS — I2510 Atherosclerotic heart disease of native coronary artery without angina pectoris: Secondary | ICD-10-CM | POA: Diagnosis present

## 2022-08-25 DIAGNOSIS — K7469 Other cirrhosis of liver: Secondary | ICD-10-CM | POA: Diagnosis present

## 2022-08-25 DIAGNOSIS — J440 Chronic obstructive pulmonary disease with acute lower respiratory infection: Secondary | ICD-10-CM | POA: Diagnosis present

## 2022-08-25 DIAGNOSIS — J309 Allergic rhinitis, unspecified: Secondary | ICD-10-CM | POA: Diagnosis present

## 2022-08-25 DIAGNOSIS — G609 Hereditary and idiopathic neuropathy, unspecified: Secondary | ICD-10-CM | POA: Diagnosis present

## 2022-08-25 DIAGNOSIS — G8929 Other chronic pain: Secondary | ICD-10-CM | POA: Diagnosis present

## 2022-08-25 DIAGNOSIS — E1122 Type 2 diabetes mellitus with diabetic chronic kidney disease: Secondary | ICD-10-CM | POA: Diagnosis present

## 2022-08-25 DIAGNOSIS — E039 Hypothyroidism, unspecified: Secondary | ICD-10-CM | POA: Diagnosis present

## 2022-08-25 DIAGNOSIS — Z7952 Long term (current) use of systemic steroids: Secondary | ICD-10-CM

## 2022-08-25 DIAGNOSIS — D539 Nutritional anemia, unspecified: Secondary | ICD-10-CM | POA: Diagnosis present

## 2022-08-25 DIAGNOSIS — J9611 Chronic respiratory failure with hypoxia: Secondary | ICD-10-CM | POA: Diagnosis present

## 2022-08-25 DIAGNOSIS — I482 Chronic atrial fibrillation, unspecified: Secondary | ICD-10-CM | POA: Diagnosis present

## 2022-08-25 DIAGNOSIS — J9621 Acute and chronic respiratory failure with hypoxia: Secondary | ICD-10-CM | POA: Diagnosis present

## 2022-08-25 DIAGNOSIS — G2581 Restless legs syndrome: Secondary | ICD-10-CM | POA: Diagnosis present

## 2022-08-25 DIAGNOSIS — J3081 Allergic rhinitis due to animal (cat) (dog) hair and dander: Secondary | ICD-10-CM | POA: Diagnosis present

## 2022-08-25 DIAGNOSIS — Z923 Personal history of irradiation: Secondary | ICD-10-CM

## 2022-08-25 DIAGNOSIS — D500 Iron deficiency anemia secondary to blood loss (chronic): Secondary | ICD-10-CM | POA: Diagnosis present

## 2022-08-25 DIAGNOSIS — J449 Chronic obstructive pulmonary disease, unspecified: Secondary | ICD-10-CM | POA: Diagnosis present

## 2022-08-25 DIAGNOSIS — R9431 Abnormal electrocardiogram [ECG] [EKG]: Secondary | ICD-10-CM | POA: Diagnosis not present

## 2022-08-25 DIAGNOSIS — E782 Mixed hyperlipidemia: Secondary | ICD-10-CM | POA: Diagnosis present

## 2022-08-25 DIAGNOSIS — Z885 Allergy status to narcotic agent status: Secondary | ICD-10-CM

## 2022-08-25 DIAGNOSIS — J441 Chronic obstructive pulmonary disease with (acute) exacerbation: Secondary | ICD-10-CM | POA: Diagnosis present

## 2022-08-25 DIAGNOSIS — Z803 Family history of malignant neoplasm of breast: Secondary | ICD-10-CM

## 2022-08-25 HISTORY — DX: Chronic respiratory failure with hypoxia: J96.11

## 2022-08-25 LAB — COMPREHENSIVE METABOLIC PANEL
ALT: 11 U/L (ref 0–44)
AST: 15 U/L (ref 15–41)
Albumin: 3 g/dL — ABNORMAL LOW (ref 3.5–5.0)
Alkaline Phosphatase: 89 U/L (ref 38–126)
Anion gap: 9 (ref 5–15)
BUN: 30 mg/dL — ABNORMAL HIGH (ref 8–23)
CO2: 28 mmol/L (ref 22–32)
Calcium: 8.7 mg/dL — ABNORMAL LOW (ref 8.9–10.3)
Chloride: 100 mmol/L (ref 98–111)
Creatinine, Ser: 1.04 mg/dL — ABNORMAL HIGH (ref 0.44–1.00)
GFR, Estimated: 55 mL/min — ABNORMAL LOW (ref >60)
Glucose, Bld: 118 mg/dL — ABNORMAL HIGH (ref 70–99)
Potassium: 4.2 mmol/L (ref 3.5–5.1)
Sodium: 137 mmol/L (ref 135–145)
Total Bilirubin: 0.5 mg/dL (ref 0.3–1.2)
Total Protein: 6.6 g/dL (ref 6.5–8.1)

## 2022-08-25 LAB — CULTURE, BLOOD (ROUTINE X 2)
Culture: NO GROWTH
Culture: NO GROWTH
Special Requests: ADEQUATE
Special Requests: ADEQUATE

## 2022-08-25 LAB — CBC WITH DIFFERENTIAL/PLATELET
Abs Immature Granulocytes: 0.06 10*3/uL (ref 0.00–0.07)
Basophils Absolute: 0.1 10*3/uL (ref 0.0–0.1)
Basophils Relative: 1 %
Eosinophils Absolute: 0.6 10*3/uL — ABNORMAL HIGH (ref 0.0–0.5)
Eosinophils Relative: 5 %
HCT: 31 % — ABNORMAL LOW (ref 36.0–46.0)
Hemoglobin: 9.1 g/dL — ABNORMAL LOW (ref 12.0–15.0)
Immature Granulocytes: 1 %
Lymphocytes Relative: 5 %
Lymphs Abs: 0.6 10*3/uL — ABNORMAL LOW (ref 0.7–4.0)
MCH: 30 pg (ref 26.0–34.0)
MCHC: 29.4 g/dL — ABNORMAL LOW (ref 30.0–36.0)
MCV: 102.3 fL — ABNORMAL HIGH (ref 80.0–100.0)
Monocytes Absolute: 0.5 10*3/uL (ref 0.1–1.0)
Monocytes Relative: 4 %
Neutro Abs: 10.1 10*3/uL — ABNORMAL HIGH (ref 1.7–7.7)
Neutrophils Relative %: 84 %
Platelets: 284 10*3/uL (ref 150–400)
RBC: 3.03 MIL/uL — ABNORMAL LOW (ref 3.87–5.11)
RDW: 12.9 % (ref 11.5–15.5)
WBC: 11.9 10*3/uL — ABNORMAL HIGH (ref 4.0–10.5)
nRBC: 0 % (ref 0.0–0.2)

## 2022-08-25 LAB — BRAIN NATRIURETIC PEPTIDE: B Natriuretic Peptide: 212.4 pg/mL — ABNORMAL HIGH (ref 0.0–100.0)

## 2022-08-25 LAB — GLUCOSE, CAPILLARY: Glucose-Capillary: 184 mg/dL — ABNORMAL HIGH (ref 70–99)

## 2022-08-25 LAB — RESP PANEL BY RT-PCR (RSV, FLU A&B, COVID)  RVPGX2
Influenza A by PCR: NEGATIVE
Influenza B by PCR: NEGATIVE
Resp Syncytial Virus by PCR: NEGATIVE
SARS Coronavirus 2 by RT PCR: NEGATIVE

## 2022-08-25 LAB — LIPASE, BLOOD: Lipase: 52 U/L — ABNORMAL HIGH (ref 11–51)

## 2022-08-25 LAB — TROPONIN I (HIGH SENSITIVITY)
Troponin I (High Sensitivity): 25 ng/L — ABNORMAL HIGH (ref <18)
Troponin I (High Sensitivity): 19 ng/L — ABNORMAL HIGH (ref <18)

## 2022-08-25 MED ORDER — ENSURE ENLIVE PO LIQD
237.0000 mL | Freq: Two times a day (BID) | ORAL | Status: DC
  Administered 2022-08-26 – 2022-08-29 (×6): 237 mL via ORAL

## 2022-08-25 MED ORDER — SODIUM CHLORIDE 0.9% FLUSH
3.0000 mL | Freq: Two times a day (BID) | INTRAVENOUS | Status: DC
  Administered 2022-08-25 – 2022-08-29 (×8): 3 mL via INTRAVENOUS

## 2022-08-25 MED ORDER — HYDROXYCHLOROQUINE SULFATE 200 MG PO TABS
200.0000 mg | ORAL_TABLET | Freq: Every day | ORAL | Status: DC
  Administered 2022-08-26 – 2022-08-29 (×4): 200 mg via ORAL
  Filled 2022-08-25 (×4): qty 1

## 2022-08-25 MED ORDER — PANTOPRAZOLE SODIUM 40 MG PO TBEC
40.0000 mg | DELAYED_RELEASE_TABLET | Freq: Every day | ORAL | Status: DC
  Administered 2022-08-26 – 2022-08-29 (×4): 40 mg via ORAL
  Filled 2022-08-25 (×4): qty 1

## 2022-08-25 MED ORDER — LACTATED RINGERS IV BOLUS
500.0000 mL | Freq: Once | INTRAVENOUS | Status: AC
  Administered 2022-08-25: 500 mL via INTRAVENOUS

## 2022-08-25 MED ORDER — ACETAMINOPHEN 650 MG RE SUPP: 650.0000 mg | Freq: Four times a day (QID) | RECTAL | Status: DC | PRN

## 2022-08-25 MED ORDER — MONTELUKAST SODIUM 10 MG PO TABS
10.0000 mg | ORAL_TABLET | Freq: Every day | ORAL | Status: DC
  Administered 2022-08-25 – 2022-08-28 (×4): 10 mg via ORAL
  Filled 2022-08-25 (×4): qty 1

## 2022-08-25 MED ORDER — ATORVASTATIN CALCIUM 20 MG PO TABS
20.0000 mg | ORAL_TABLET | Freq: Every evening | ORAL | Status: DC
  Administered 2022-08-25 – 2022-08-28 (×4): 20 mg via ORAL
  Filled 2022-08-25 (×4): qty 1

## 2022-08-25 MED ORDER — SODIUM CHLORIDE 0.9 % IV SOLN
500.0000 mg | Freq: Once | INTRAVENOUS | Status: AC
  Administered 2022-08-25: 500 mg via INTRAVENOUS
  Filled 2022-08-25: qty 5

## 2022-08-25 MED ORDER — ACETAMINOPHEN 325 MG PO TABS
650.0000 mg | ORAL_TABLET | Freq: Four times a day (QID) | ORAL | Status: DC | PRN
  Administered 2022-08-26: 650 mg via ORAL
  Filled 2022-08-25: qty 2

## 2022-08-25 MED ORDER — BUDESONIDE 0.25 MG/2ML IN SUSP
0.2500 mg | Freq: Two times a day (BID) | RESPIRATORY_TRACT | Status: DC
  Administered 2022-08-25 – 2022-08-29 (×8): 0.25 mg via RESPIRATORY_TRACT
  Filled 2022-08-25 (×8): qty 2

## 2022-08-25 MED ORDER — HEPARIN SODIUM (PORCINE) 5000 UNIT/ML IJ SOLN
5000.0000 [IU] | Freq: Three times a day (TID) | INTRAMUSCULAR | Status: DC
  Administered 2022-08-25 – 2022-08-27 (×5): 5000 [IU] via SUBCUTANEOUS
  Filled 2022-08-25 (×5): qty 1

## 2022-08-25 MED ORDER — ONDANSETRON HCL 4 MG PO TABS: 4.0000 mg | ORAL_TABLET | Freq: Four times a day (QID) | ORAL | Status: DC | PRN

## 2022-08-25 MED ORDER — ONDANSETRON HCL 4 MG/2ML IJ SOLN: 4.0000 mg | Freq: Four times a day (QID) | INTRAMUSCULAR | Status: DC | PRN

## 2022-08-25 MED ORDER — SERTRALINE HCL 50 MG PO TABS
150.0000 mg | ORAL_TABLET | Freq: Every day | ORAL | Status: DC
  Administered 2022-08-26 – 2022-08-29 (×4): 150 mg via ORAL
  Filled 2022-08-25 (×4): qty 3

## 2022-08-25 MED ORDER — LEFLUNOMIDE 10 MG PO TABS
10.0000 mg | ORAL_TABLET | Freq: Every day | ORAL | Status: DC
  Administered 2022-08-26 – 2022-08-29 (×4): 10 mg via ORAL
  Filled 2022-08-25 (×4): qty 1

## 2022-08-25 MED ORDER — IPRATROPIUM-ALBUTEROL 0.5-2.5 (3) MG/3ML IN SOLN
3.0000 mL | Freq: Four times a day (QID) | RESPIRATORY_TRACT | Status: DC | PRN
  Administered 2022-08-28: 3 mL via RESPIRATORY_TRACT
  Filled 2022-08-25: qty 3

## 2022-08-25 MED ORDER — GUAIFENESIN ER 600 MG PO TB12
600.0000 mg | ORAL_TABLET | Freq: Two times a day (BID) | ORAL | Status: DC
  Administered 2022-08-25 – 2022-08-29 (×8): 600 mg via ORAL
  Filled 2022-08-25 (×8): qty 1

## 2022-08-25 MED ORDER — CARVEDILOL 3.125 MG PO TABS
3.1250 mg | ORAL_TABLET | Freq: Two times a day (BID) | ORAL | Status: DC
  Administered 2022-08-26 – 2022-08-27 (×4): 3.125 mg via ORAL
  Filled 2022-08-25 (×4): qty 1

## 2022-08-25 MED ORDER — SENNOSIDES-DOCUSATE SODIUM 8.6-50 MG PO TABS: 1.0000 | ORAL_TABLET | Freq: Every evening | ORAL | Status: DC | PRN

## 2022-08-25 MED ORDER — SODIUM CHLORIDE 0.9 % IV SOLN
500.0000 mg | INTRAVENOUS | Status: DC
  Administered 2022-08-26 – 2022-08-28 (×3): 500 mg via INTRAVENOUS
  Filled 2022-08-25 (×3): qty 5

## 2022-08-25 MED ORDER — SODIUM CHLORIDE 0.9 % IV SOLN
1.0000 g | INTRAVENOUS | Status: DC
  Administered 2022-08-27 – 2022-08-28 (×3): 1 g via INTRAVENOUS
  Filled 2022-08-25 (×3): qty 10

## 2022-08-25 MED ORDER — PREDNISONE 20 MG PO TABS
40.0000 mg | ORAL_TABLET | Freq: Every day | ORAL | Status: DC
  Administered 2022-08-26 – 2022-08-29 (×4): 40 mg via ORAL
  Filled 2022-08-25 (×4): qty 2

## 2022-08-25 MED ORDER — IRBESARTAN 300 MG PO TABS
300.0000 mg | ORAL_TABLET | Freq: Every day | ORAL | Status: DC
  Administered 2022-08-26 – 2022-08-29 (×4): 300 mg via ORAL
  Filled 2022-08-25 (×4): qty 1

## 2022-08-25 MED ORDER — ARFORMOTEROL TARTRATE 15 MCG/2ML IN NEBU
15.0000 ug | INHALATION_SOLUTION | Freq: Two times a day (BID) | RESPIRATORY_TRACT | Status: DC
  Administered 2022-08-25 – 2022-08-29 (×8): 15 ug via RESPIRATORY_TRACT
  Filled 2022-08-25 (×9): qty 2

## 2022-08-25 MED ORDER — AMLODIPINE BESYLATE 10 MG PO TABS
5.0000 mg | ORAL_TABLET | Freq: Every day | ORAL | Status: DC
  Administered 2022-08-26 – 2022-08-29 (×4): 5 mg via ORAL
  Filled 2022-08-25 (×4): qty 1

## 2022-08-25 MED ORDER — ASPIRIN 81 MG PO TBEC
81.0000 mg | DELAYED_RELEASE_TABLET | Freq: Every day | ORAL | Status: DC
  Administered 2022-08-26 – 2022-08-29 (×4): 81 mg via ORAL
  Filled 2022-08-25 (×4): qty 1

## 2022-08-25 MED ORDER — SODIUM CHLORIDE 0.9 % IV SOLN
1.0000 g | Freq: Once | INTRAVENOUS | Status: AC
  Administered 2022-08-25: 1 g via INTRAVENOUS
  Filled 2022-08-25: qty 10

## 2022-08-25 MED ORDER — GABAPENTIN 100 MG PO CAPS
100.0000 mg | ORAL_CAPSULE | Freq: Two times a day (BID) | ORAL | Status: DC
  Administered 2022-08-25 – 2022-08-29 (×8): 100 mg via ORAL
  Filled 2022-08-25 (×8): qty 1

## 2022-08-25 NOTE — ED Provider Notes (Signed)
[place] EMERGENCY DEPARTMENT AT [facility] Provider Note   CSN: [id number] Arrival date & time: 08/25/22  1421     History No chief complaint on file.   HPI Desiree [name] is a 77 y.o. female presenting for chief complaint of shortness of breath. She is a 77 year old female coming in with a chief complaint of worsening pneumonia.  Recently discharged from the hospital 2 days ago.  Unfortunately the nursing facility did not have any of her medications when she arrived home yesterday. She states that she has had interval worsening of her dyspnea over the past 48 hours since her discharge they have increased her oxygen to 4 L which is the max her device can handle.  She continues to have episodes of desaturation events to the low 70s and interval worsening of her shortness of breath. When she is at complete rest on her 4 L she is satting in the mid 90s. She denies fevers or chills, nausea or vomiting, syncope or chest pain at this time. Patient's recorded medical, surgical, social, medication list and allergies were reviewed in the Snapshot window as part of the initial history.   Review of Systems   Review of Systems  Constitutional:  Negative for chills and fever.  HENT:  Negative for ear pain and sore throat.   Eyes:  Negative for pain and visual disturbance.  Respiratory:  Positive for shortness of breath. Negative for cough.   Cardiovascular:  Positive for chest pain. Negative for palpitations.  Gastrointestinal:  Negative for abdominal pain and vomiting.  Genitourinary:  Negative for dysuria and hematuria.  Musculoskeletal:  Negative for arthralgias and back pain.  Skin:  Negative for color change and rash.  Neurological:  Negative for seizures and syncope.  All other systems reviewed and are negative.   Physical Exam Updated Vital Signs BP 139/60 (BP Location: Left Arm)   Pulse 97   Temp 97.8 F (36.6 C) (Oral)   Resp 18   Ht 5' (1.524 m)   Wt 45.7 kg    SpO2 94%   BMI 19.68 kg/m  Physical Exam Vitals and nursing note reviewed.  Constitutional:      General: She is not in acute distress.    Appearance: She is well-developed.  HENT:     Head: Normocephalic and atraumatic.  Eyes:     Conjunctiva/sclera: Conjunctivae normal.  Cardiovascular:     Rate and Rhythm: Normal rate and regular rhythm.     Heart sounds: No murmur heard. Pulmonary:     Effort: Pulmonary effort is normal. No respiratory distress.     Breath sounds: Rhonchi present.  Abdominal:     General: There is no distension.     Palpations: Abdomen is soft.     Tenderness: There is no abdominal tenderness. There is no right CVA tenderness or left CVA tenderness.  Musculoskeletal:        General: No swelling or tenderness. Normal range of motion.     Cervical back: Neck supple.  Skin:    General: Skin is warm and dry.  Neurological:     General: No focal deficit present.     Mental Status: She is alert and oriented to person, place, and time. Mental status is at baseline.     Cranial Nerves: No cranial nerve deficit.      ED Course/ Medical Decision Making/ A&P    Procedures Procedures   Medications Ordered in ED Medications  heparin injection 5,000 Units (5,000 Units  Subcutaneous Given 08/25/22 2125)  sodium chloride flush (NS) 0.9 % injection 3 mL (3 mLs Intravenous Given 08/25/22 2240)  acetaminophen (TYLENOL) tablet 650 mg (has no administration in time range)    Or  acetaminophen (TYLENOL) suppository 650 mg (has no administration in time range)  ondansetron (ZOFRAN) tablet 4 mg (has no administration in time range)    Or  ondansetron (ZOFRAN) injection 4 mg (has no administration in time range)  senna-docusate (Senokot-S) tablet 1 tablet (has no administration in time range)  guaiFENesin (MUCINEX) 12 hr tablet 600 mg (600 mg Oral Given 08/25/22 2125)  arformoterol (BROVANA) nebulizer solution 15 mcg (15 mcg Nebulization Given 08/25/22 2222)  budesonide  (PULMICORT) nebulizer solution 0.25 mg (0.25 mg Nebulization Given 08/25/22 2222)  predniSONE (DELTASONE) tablet 40 mg (has no administration in time range)  azithromycin (ZITHROMAX) 500 mg in sodium chloride 0.9 % 250 mL IVPB (has no administration in time range)  cefTRIAXone (ROCEPHIN) 1 g in sodium chloride 0.9 % 100 mL IVPB (has no administration in time range)  ipratropium-albuterol (DUONEB) 0.5-2.5 (3) MG/3ML nebulizer solution 3 mL (has no administration in time range)  amLODipine (NORVASC) tablet 5 mg (has no administration in time range)  aspirin EC tablet 81 mg (has no administration in time range)  atorvastatin (LIPITOR) tablet 20 mg (20 mg Oral Given 08/25/22 2125)  carvedilol (COREG) tablet 3.125 mg (has no administration in time range)  gabapentin (NEURONTIN) capsule 100 mg (100 mg Oral Given 08/25/22 2125)  hydroxychloroquine (PLAQUENIL) tablet 200 mg (has no administration in time range)  leflunomide (ARAVA) tablet 10 mg (has no administration in time range)  montelukast (SINGULAIR) tablet 10 mg (10 mg Oral Given 08/25/22 2125)  pantoprazole (PROTONIX) EC tablet 40 mg (has no administration in time range)  sertraline (ZOLOFT) tablet 150 mg (has no administration in time range)  irbesartan (AVAPRO) tablet 300 mg (has no administration in time range)  feeding supplement (ENSURE ENLIVE / ENSURE PLUS) liquid 237 mL (has no administration in time range)  cefTRIAXone (ROCEPHIN) 1 g in sodium chloride 0.9 % 100 mL IVPB (0 g Intravenous Stopped 08/25/22 1839)  lactated ringers bolus 500 mL (500 mLs Intravenous New Bag/Given 08/25/22 1846)  azithromycin (ZITHROMAX) 500 mg in sodium chloride 0.9 % 250 mL IVPB (500 mg Intravenous New Bag/Given 08/25/22 1846)    Medical Decision Making:    Desiree [name] is a 77 y.o. female who presented to the ED today with worsening shortness of breath detailed above.     Complete initial physical exam performed, notably the patient  was hemodynamically  stable in no acute distress.      Reviewed and confirmed nursing documentation for past medical history, family history, social history.    Initial Assessment:   With the patient's presentation of progressive shortness of breath, most likely diagnosis is progression of her pneumonia. Other diagnoses were considered including (but not limited to) secondary infection such as staphylococcal infection, bacteremia, urinary tract infection, metabolic abnormality, heart failure exacerbation. These are considered less likely due to history of present illness and physical exam findings.   This is most consistent with an acute life/limb threatening illness complicated by underlying chronic conditions.  Initial Plan:  Screening labs including CBC and Metabolic panel to evaluate for infectious or metabolic etiology of disease.  Urinalysis with reflex culture ordered to evaluate for UTI or relevant urologic/nephrologic pathology.  CXR to evaluate for structural/infectious intrathoracic pathology.  Troponin/BNP/EKG to evaluate for cardiac pathology. Objective evaluation as below reviewed  with plan for close reassessment  Initial Study Results:   Laboratory  All laboratory results reviewed without evidence of clinically relevant pathology.   Exceptions include: Troponin/BNP elevation  EKG EKG was reviewed independently. Rate, rhythm, axis, intervals all examined and without medically relevant abnormality. ST segments without concerns for elevations.    Radiology  All images reviewed independently. Agree with radiology report at this time.   DG Chest Portable 1 View  Result Date: 08/25/2022 CLINICAL DATA:  Shortness of breath EXAM: PORTABLE CHEST 1 VIEW COMPARISON:  08/19/2022, CT 08/20/2022 FINDINGS: Slightly worsened bibasilar infiltrate. Low lung volumes. Stable cardiomediastinal silhouette with aortic atherosclerosis. No pneumothorax. Chronic right shoulder deformity. IMPRESSION: Low lung volumes with  worsening bibasilar infiltrates. Electronically Signed   By: Jasmine Pang M.D.   On: 08/25/2022 17:01     Reassessment and Plan:   Patient's history of present on his physicals and findings most consistent with ongoing pneumonia.  Treated aggressively in emergency department however interval worsening while here. Still on 4 L nasal cannula.  Consult hospitalist regarding recommendations.  Uncertain if this is failure of medications were nonadherent secondary to pharmacy problems.  Will continue with ceftriaxone and azithromycin given interval improvement on his medications and only worsening off the medication.  Discussed with hospitalist who is in agreement.  Disposition:   Based on the above findings, I believe this patient is stable for admission.    Patient/family educated about specific findings on our evaluation and explained exact reasons for admission.  Patient/family educated about clinical situation and time was allowed to answer questions.   Admission team communicated with and agreed with need for admission. Patient admitted. Patient ready to move at this time.     Emergency Department Medication Summary:   Medications  heparin injection 5,000 Units (5,000 Units Subcutaneous Given 08/25/22 2125)  sodium chloride flush (NS) 0.9 % injection 3 mL (3 mLs Intravenous Given 08/25/22 2240)  acetaminophen (TYLENOL) tablet 650 mg (has no administration in time range)    Or  acetaminophen (TYLENOL) suppository 650 mg (has no administration in time range)  ondansetron (ZOFRAN) tablet 4 mg (has no administration in time range)    Or  ondansetron (ZOFRAN) injection 4 mg (has no administration in time range)  senna-docusate (Senokot-S) tablet 1 tablet (has no administration in time range)  guaiFENesin (MUCINEX) 12 hr tablet 600 mg (600 mg Oral Given 08/25/22 2125)  arformoterol (BROVANA) nebulizer solution 15 mcg (15 mcg Nebulization Given 08/25/22 2222)  budesonide (PULMICORT) nebulizer solution  0.25 mg (0.25 mg Nebulization Given 08/25/22 2222)  predniSONE (DELTASONE) tablet 40 mg (has no administration in time range)  azithromycin (ZITHROMAX) 500 mg in sodium chloride 0.9 % 250 mL IVPB (has no administration in time range)  cefTRIAXone (ROCEPHIN) 1 g in sodium chloride 0.9 % 100 mL IVPB (has no administration in time range)  ipratropium-albuterol (DUONEB) 0.5-2.5 (3) MG/3ML nebulizer solution 3 mL (has no administration in time range)  amLODipine (NORVASC) tablet 5 mg (has no administration in time range)  aspirin EC tablet 81 mg (has no administration in time range)  atorvastatin (LIPITOR) tablet 20 mg (20 mg Oral Given 08/25/22 2125)  carvedilol (COREG) tablet 3.125 mg (has no administration in time range)  gabapentin (NEURONTIN) capsule 100 mg (100 mg Oral Given 08/25/22 2125)  hydroxychloroquine (PLAQUENIL) tablet 200 mg (has no administration in time range)  leflunomide (ARAVA) tablet 10 mg (has no administration in time range)  montelukast (SINGULAIR) tablet 10 mg (10 mg Oral Given 08/25/22  2125)  pantoprazole (PROTONIX) EC tablet 40 mg (has no administration in time range)  sertraline (ZOLOFT) tablet 150 mg (has no administration in time range)  irbesartan (AVAPRO) tablet 300 mg (has no administration in time range)  feeding supplement (ENSURE ENLIVE / ENSURE PLUS) liquid 237 mL (has no administration in time range)  cefTRIAXone (ROCEPHIN) 1 g in sodium chloride 0.9 % 100 mL IVPB (0 g Intravenous Stopped 08/25/22 1839)  lactated ringers bolus 500 mL (500 mLs Intravenous New Bag/Given 08/25/22 1846)  azithromycin (ZITHROMAX) 500 mg in sodium chloride 0.9 % 250 mL IVPB (500 mg Intravenous New Bag/Given 08/25/22 1846)        Clinical Impression:  1. Pneumonia due to infectious organism, unspecified laterality, unspecified part of lung      Admit   Final Clinical Impression(s) / ED Diagnoses Final diagnoses:  Pneumonia due to infectious organism, unspecified laterality,  unspecified part of lung    Rx / DC Orders ED Discharge Orders     None         Glyn Ade, MD 08/25/22 2357

## 2022-08-25 NOTE — ED Notes (Signed)
ED TO INPATIENT HANDOFF REPORT  ED Nurse Name and Phone #: Huntley Dec [phone]  S Name/Age/Gender Desiree [name] 77 y.o. female Room/Bed: WA09/WA09  Code Status   Code Status: Prior  Home/SNF/Other Patient oriented to: self, place, time, and situation Is this baseline? Yes   Triage Complete: Triage complete  Chief Complaint Acute on chronic hypoxic respiratory failure (HCC) [J96.21]  Triage Note Pt was bib EMS. Was just discharged on 7/19 with a dx of pneumonia. Pt just received meds at 3 pm yesterday. And was out of them for 36 hours. O2 Sats was in the 80s was on 4L/[place] when EMS arrived. She was given 1 Albuterol tx, Duoneb, and 125mg  of Solumedrol in a 22g in LFA. Pt sounded tight in lower lobes and has began to open up after meds given. VS B/P 140/60, P 100, R 20, O2Sats 100, Duoneb still going, CBG 122.    Allergies Allergies  Allergen Reactions   Other Shortness Of Breath    Cats   Oxycodone Other (See Comments)    hallucinations    Level of Care/Admitting Diagnosis ED Disposition     ED Disposition  Admit   Condition  --   Comment  Hospital Area: [facility] [[id number]]  Level of Care: Telemetry [5]  Admit to tele based on following criteria: Monitor for Ischemic changes  May admit patient to [facility] or [facility] if equivalent level of care is available:: No  Covid Evaluation: Confirmed COVID Negative  Diagnosis: Acute on chronic hypoxic respiratory failure [facility]) [[id number]]  Admitting Physician: Charlsie Quest [[id number]]  Attending Physician: Charlsie Quest [[id number]]  Certification:: I certify this patient will need inpatient services for at least 2 midnights  Estimated Length of Stay: 2          B Medical/Surgery History Past Medical History:  Diagnosis Date   Abnormal nuclear stress test    AF (paroxysmal atrial fibrillation) (HCC)    Age-related osteoporosis without current pathological fracture 05/16/2016   Alcoholism (HCC)  10/21/2018   Formatting of this note might be different from the original. 2 glasses a day.   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 Wrist surgery   Benign paroxysmal positional vertigo due to bilateral vestibular disorder 02/09/2020   Cancer of ascending colon [facility])    Carcinoma of ascending colon (HCC)    Cat allergies    Centrilobular emphysema (HCC) 05/16/2016   Chronic anemia 04/09/2021   Chronic atrial fibrillation, unspecified (HCC) 10/04/2020   Chronic obstructive pulmonary disease (HCC) 04/09/2021   Chronic pain 04/09/2021   CKD (chronic kidney disease) stage 3, GFR 30-59 ml/min (HCC) 01/04/2018   COPD (chronic obstructive pulmonary disease) (HCC)    Cysts of both ovaries 11/24/2019   Formatting of this note might be different from the original. On CT 11/2019. Referred to GYN.   Dyspnea    Early satiety 04/14/2019   Essential hypertension 08/22/2010   Formatting of this note might be different from the original. Formatting of this note might be different from the original. D/C ACE 08/22/2010 due to concerns with freq aecopd/ pseudoasthma > resolved  Last Assessment & Plan:  Formatting of this note might be different from the original. Adequate control on present rx, reviewed - note she's no longer falling apart during her aecopd's which are less  Gastro-esophageal reflux disease without esophagitis 07/14/2017   Gastrointestinal hemorrhage    History of pulmonary embolus (PE) 07/14/2017   History of radiation therapy    Left lung- SBRT 11/13/20-11/27/20- Dr. Antony Blackbird   History of radiation therapy    Right and Left Lung 06/16/22-06/27/22- Dr. 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   Lung cancer, lower lobe (HCC) 09/24/2020   Lung nodule, solitary 08/16/2020   Added automatically from request for surgery [id number]   [facility] and fatigue 07/14/2017   Malignant neoplasm of unspecified part of left bronchus or lung (HCC) 04/09/2021   Malignant tumor of ascending colon (HCC) 04/09/2021   Mild CAD 02/02/2019   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 Possible???? See CT comments 11/2019.   Other vitamin B12 deficiency anemias 04/09/2021   Paroxysmal atrial fibrillation (HCC) 04/14/2019   Formatting of this note might be different from the original. [facility].   Prediabetes 07/14/2017   Pulmonary embolism (HCC) 2017   Rheumatoid arthritis (HCC) 09/20/2010   Dr Nickola Major in [place].   Rheumatoid arthritis(714.0)    Dr Azzie Roup follows   Schatzki's ring    Senile purpura (HCC) 01/04/2018   Skin rash 07/14/2017   Bx'd Allergic. Likely meds.   Status post thoracentesis    Symptomatic anemia 07/25/2020   Type 2 diabetes mellitus without complications (HCC) 04/09/2021   Vitamin B12 deficiency 07/14/2017   Vitamin D deficiency 04/09/2021   Past Surgical History:  Procedure Laterality Date   BIOPSY  07/27/2020   Procedure: BIOPSY;  Surgeon: Iva Boop, MD;  Location: [facility] ENDOSCOPY;  Service: Endoscopy;;   BRONCHIAL BIOPSY  09/11/2020   Procedure: BRONCHIAL BIOPSIES;  Surgeon: Josephine Igo, DO;  Location: [facility] ENDOSCOPY;  Service: Pulmonary;;   BRONCHIAL BRUSHINGS  09/11/2020   Procedure: BRONCHIAL BRUSHINGS;  Surgeon: Josephine Igo, DO;  Location: [facility] ENDOSCOPY;  Service: Pulmonary;;   BRONCHIAL NEEDLE ASPIRATION BIOPSY  09/11/2020   Procedure: BRONCHIAL NEEDLE  ASPIRATION BIOPSIES;  Surgeon: Josephine Igo, DO;  Location: [facility] ENDOSCOPY;  Service: Pulmonary;;   BRONCHIAL WASHINGS  09/11/2020   Procedure: BRONCHIAL WASHINGS;  Surgeon: Josephine Igo, DO;  Location: [facility] ENDOSCOPY;  Service: Pulmonary;;   COLONOSCOPY N/A 07/27/2020   Procedure: COLONOSCOPY;  Surgeon: Iva Boop, MD;  Location: [facility] ENDOSCOPY;  Service: Endoscopy;  Laterality: N/A;   CYSTECTOMY Right    breast   ESOPHAGOGASTRODUODENOSCOPY (EGD) WITH PROPOFOL N/A 07/27/2020   Procedure: ESOPHAGOGASTRODUODENOSCOPY (EGD) WITH PROPOFOL;  Surgeon: Iva Boop, MD;  Location: [facility] ENDOSCOPY;  Service: Endoscopy;  Laterality: N/A;   FIDUCIAL MARKER PLACEMENT  09/11/2020   Procedure: FIDUCIAL MARKER PLACEMENT;  Surgeon: Josephine Igo, DO;  Location: [facility] ENDOSCOPY;  Service: Pulmonary;;   LAPAROSCOPIC RIGHT HEMI COLECTOMY Right 07/31/2020   Procedure: LAPAROSCOPIC ASSISSTED  RIGHT HEMI COLECTOMY;  Surgeon: Manus Rudd, MD;  Location: [facility] OR;  Service: General;  Laterality: Right;   LEFT HEART CATH AND CORONARY ANGIOGRAPHY N/A 01/05/2019   Procedure: LEFT HEART CATH AND CORONARY ANGIOGRAPHY;  Surgeon: Corky Crafts, MD;  Location: [facility]  INVASIVE CV LAB;  Service: Cardiovascular;  Laterality: N/A;   SUBMUCOSAL TATTOO INJECTION  07/27/2020   Procedure: SUBMUCOSAL TATTOO INJECTION;  Surgeon: Iva Boop, MD;  Location: [facility] ENDOSCOPY;  Service: Endoscopy;;   VIDEO BRONCHOSCOPY WITH ENDOBRONCHIAL NAVIGATION Left 09/11/2020   Procedure: VIDEO BRONCHOSCOPY WITH ENDOBRONCHIAL NAVIGATION;  Surgeon: Josephine Igo, DO;  Location: [facility] ENDOSCOPY;  Service: Pulmonary;  Laterality: Left;  ION   VIDEO BRONCHOSCOPY WITH RADIAL ENDOBRONCHIAL ULTRASOUND  09/11/2020   Procedure: RADIAL ENDOBRONCHIAL ULTRASOUND;  Surgeon: Josephine Igo, DO;  Location: [facility] ENDOSCOPY;  Service: Pulmonary;;     A IV Location/Drains/Wounds Patient Lines/Drains/Airways Status     Active Line/Drains/Airways     Name Placement  date Placement time Site Days   Peripheral IV 08/25/22 20 G Anterior;Distal;Right Forearm 08/25/22  1526  Forearm  less than 1            Intake/Output Last 24 hours  Intake/Output Summary (Last 24 hours) at 08/25/2022 1944 Last data filed at 08/25/2022 1839 Gross per 24 hour  Intake 100 ml  Output --  Net 100 ml    Labs/Imaging Results for orders placed or performed during the hospital encounter of 08/25/22 (from the past 48 hour(s))  CBC with Differential     Status: Abnormal   Collection Time: 08/25/22  3:24 PM  Result Value Ref Range   WBC 11.9 (H) 4.0 - 10.5 K/uL   RBC 3.03 (L) 3.87 - 5.11 MIL/uL   Hemoglobin 9.1 (L) 12.0 - 15.0 g/dL   HCT 82.9 (L) 56.2 - 13.0 %   MCV 102.3 (H) 80.0 - 100.0 fL   MCH 30.0 26.0 - 34.0 pg   MCHC 29.4 (L) 30.0 - 36.0 g/dL   RDW 86.5 78.4 - 69.6 %   Platelets 284 150 - 400 K/uL   nRBC 0.0 0.0 - 0.2 %   Neutrophils Relative % 84 %   Neutro Abs 10.1 (H) 1.7 - 7.7 K/uL   Lymphocytes Relative 5 %   Lymphs Abs 0.6 (L) 0.7 - 4.0 K/uL   Monocytes Relative 4 %   Monocytes Absolute 0.5 0.1 - 1.0 K/uL   Eosinophils Relative 5 %   Eosinophils Absolute 0.6 (H) 0.0 - 0.5 K/uL   Basophils Relative 1 %   Basophils Absolute 0.1 0.0 - 0.1 K/uL   Immature Granulocytes 1 %   Abs Immature Granulocytes 0.06 0.00 - 0.07 K/uL    Comment: Performed at [facility], [address], [place], [place] [address]  Comprehensive metabolic panel     Status: Abnormal   Collection Time: 08/25/22  3:24 PM  Result Value Ref Range   Sodium 137 135 - 145 mmol/L   Potassium 4.2 3.5 - 5.1 mmol/L   Chloride 100 98 - 111 mmol/L   CO2 28 22 - 32 mmol/L   Glucose, Bld 118 (H) 70 - 99 mg/dL    Comment: Glucose reference range applies only to samples taken after fasting for at least 8 hours.   BUN 30 (H) 8 - 23 mg/dL   Creatinine, Ser 4.13 (H) 0.44 - 1.00 mg/dL   Calcium 8.7 (L) 8.9 - 10.3 mg/dL   Total Protein 6.6 6.5 - 8.1 g/dL   Albumin 3.0 (L) 3.5  - 5.0 g/dL   AST 15 15 - 41 U/L   ALT 11 0 - 44 U/L   Alkaline Phosphatase 89 38 - 126 U/L   Total Bilirubin 0.5 0.3 - 1.2 mg/dL   GFR,  Estimated 55 (L) >60 mL/min    Comment: (NOTE) Calculated using the CKD-EPI Creatinine Equation (2021)    Anion gap 9 5 - 15    Comment: Performed at [facility], [address], [place], [place] [address]  Lipase, blood     Status: Abnormal   Collection Time: 08/25/22  3:24 PM  Result Value Ref Range   Lipase 52 (H) 11 - 51 U/L    Comment: Performed at [facility], [address], [place], [place] [address]  Troponin I (High Sensitivity)     Status: Abnormal   Collection Time: 08/25/22  3:24 PM  Result Value Ref Range   Troponin I (High Sensitivity) 25 (H) <18 ng/L    Comment: (NOTE) Elevated high sensitivity troponin I (hsTnI) values and significant  changes across serial measurements may suggest ACS but many other  chronic and acute conditions are known to elevate hsTnI results.  Refer to the "Links" section for chest pain algorithms and additional  guidance. Performed at [facility], [address], [place], [place] [address]   Resp panel by RT-PCR (RSV, Flu A&B, Covid) Anterior Nasal Swab     Status: None   Collection Time: 08/25/22  3:24 PM   Specimen: Anterior Nasal Swab  Result Value Ref Range   SARS Coronavirus 2 by RT PCR NEGATIVE NEGATIVE    Comment: (NOTE) SARS-CoV-2 target nucleic acids are NOT DETECTED.  The SARS-CoV-2 RNA is generally detectable in upper respiratory specimens during the acute phase of infection. The lowest concentration of SARS-CoV-2 viral copies this assay can detect is 138 copies/mL. A negative result does not preclude SARS-Cov-2 infection and should not be used as the sole basis for treatment or other patient management decisions. A negative result may occur with  improper specimen collection/handling, submission of specimen other than  nasopharyngeal swab, presence of viral mutation(s) within the areas targeted by this assay, and inadequate number of viral copies(<138 copies/mL). A negative result must be combined with clinical observations, patient history, and epidemiological information. The expected result is Negative.  Fact Sheet for Patients:  BloggerCourse.com  Fact Sheet for Healthcare Providers:  SeriousBroker.it  This test is no t yet approved or cleared by the [place] FDA and  has been authorized for detection and/or diagnosis of SARS-CoV-2 by FDA under an Emergency Use Authorization (EUA). This EUA will remain  in effect (meaning this test can be used) for the duration of the COVID-19 declaration under Section 564(b)(1) of the Act, 21 U.S.C.section 360bbb-3(b)(1), unless the authorization is terminated  or revoked sooner.       Influenza A by PCR NEGATIVE NEGATIVE   Influenza B by PCR NEGATIVE NEGATIVE    Comment: (NOTE) The Xpert Xpress SARS-CoV-2/FLU/RSV plus assay is intended as an aid in the diagnosis of influenza from Nasopharyngeal swab specimens and should not be used as a sole basis for treatment. Nasal washings and aspirates are unacceptable for Xpert Xpress SARS-CoV-2/FLU/RSV testing.  Fact Sheet for Patients: BloggerCourse.com  Fact Sheet for Healthcare Providers: SeriousBroker.it  This test is not yet approved or cleared by the [place] FDA and has been authorized for detection and/or diagnosis of SARS-CoV-2 by FDA under an Emergency Use Authorization (EUA). This EUA will remain in effect (meaning this test can be used) for the duration of the COVID-19 declaration under Section 564(b)(1) of the Act, 21 U.S.C. section 360bbb-3(b)(1), unless the authorization is terminated or revoked.     Resp Syncytial Virus  by PCR NEGATIVE NEGATIVE    Comment: (NOTE) Fact Sheet for  Patients: BloggerCourse.com  Fact Sheet for Healthcare Providers: SeriousBroker.it  This test is not yet approved or cleared by the [place] FDA and has been authorized for detection and/or diagnosis of SARS-CoV-2 by FDA under an Emergency Use Authorization (EUA). This EUA will remain in effect (meaning this test can be used) for the duration of the COVID-19 declaration under Section 564(b)(1) of the Act, 21 U.S.C. section 360bbb-3(b)(1), unless the authorization is terminated or revoked.  Performed at [facility], [address] [place], [place] [address]   Brain natriuretic peptide     Status: Abnormal   Collection Time: 08/25/22  3:24 PM  Result Value Ref Range   B Natriuretic Peptide 212.4 (H) 0.0 - 100.0 pg/mL    Comment: Performed at [facility], [address], [place], [place] [address]  Troponin I (High Sensitivity)     Status: Abnormal   Collection Time: 08/25/22  6:50 PM  Result Value Ref Range   Troponin I (High Sensitivity) 19 (H) <18 ng/L    Comment: (NOTE) Elevated high sensitivity troponin I (hsTnI) values and significant  changes across serial measurements may suggest ACS but many other  chronic and acute conditions are known to elevate hsTnI results.  Refer to the "Links" section for chest pain algorithms and additional  guidance. Performed at [facility], [address] [place], [place] [address]    DG Chest Portable 1 View  Result Date: 08/25/2022 CLINICAL DATA:  Shortness of breath EXAM: PORTABLE CHEST 1 VIEW COMPARISON:  08/19/2022, CT 08/20/2022 FINDINGS: Slightly worsened bibasilar infiltrate. Low lung volumes. Stable cardiomediastinal silhouette with aortic atherosclerosis. No pneumothorax. Chronic right shoulder deformity. IMPRESSION: Low lung volumes with worsening bibasilar infiltrates. Electronically Signed   By: Jasmine Pang M.D.    On: 08/25/2022 17:01    Pending Labs Unresulted Labs (From admission, onward)     Start     Ordered   08/25/22 1750  Blood culture (routine x 2)  BLOOD CULTURE X 2,   R      08/25/22 1749            Vitals/Pain Today's Vitals   08/25/22 1503 08/25/22 1645 08/25/22 1900 08/25/22 1903  BP:  (!) 130/54 126/66   Pulse:  97 97   Resp:  17 20   Temp:    98.3 F (36.8 C)  TempSrc:      SpO2:  97% 91%   Weight: 46.3 kg     Height: 5' (1.524 m)       Isolation Precautions No active isolations  Medications Medications  azithromycin (ZITHROMAX) 500 mg in sodium chloride 0.9 % 250 mL IVPB (500 mg Intravenous New Bag/Given 08/25/22 1846)  cefTRIAXone (ROCEPHIN) 1 g in sodium chloride 0.9 % 100 mL IVPB (0 g Intravenous Stopped 08/25/22 1839)  lactated ringers bolus 500 mL (500 mLs Intravenous New Bag/Given 08/25/22 1846)    Mobility walks with walker, but has been using WC last few days     Focused Assessments  R Recommendations: See Admitting Provider Note  Report given to:   Additional Notes:

## 2022-08-25 NOTE — H&P (Signed)
History and Physical    Desiree [name] EXB:[id number] DOB: 1945-07-03 DOA: 08/25/2022  PCP: Patient, No Pcp Per  Patient coming from: SNF  I have personally briefly reviewed patient's old medical records in [facility]  Chief Complaint: Shortness of breath  HPI: Desiree [name] is a 77 y.o. 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  Patient recently admitted 08/19/2022-08/22/2022 for treatment of multifocal pneumonia.  She was treated with IV ceftriaxone and azithromycin in hospital with improvement.  She was discharged on her baseline 3 L O2 via [place] with a prescription for amoxicillin doxycycline to complete her antibiotic course.  Family notes that patient's pharmacy did not send the prescription of her antibiotics.  Today patient was noted to have increased shortness of breath.  She became hypoxic down to 74% while on 4 L via [place] at her facility.  EMS were called and she was brought to the ED for further evaluation.  She did receive 125 mg Solu-Medrol, albuterol and DuoNeb treatments en route to the ED.  ED Course  Labs/Imaging on admission: I have personally reviewed following labs and imaging studies.  Initial vitals showed BP 130/59, pulse 96, RR 18, temp 98.3 F, SpO2 100% on 6 L O2 via [place].  Labs show WBC 11.9, hemoglobin 9.1, platelets 284,000, sodium 137, potassium 4.2, bicarb 28, BUN 30, creatinine 1.04, serum glucose 118, LFTs within normal limits, lipase 52, troponin 25.  SARS-CoV-2, influenza, RSV PCR negative.  Blood cultures in process.  Portable chest x-ray showed low  lung volumes with slightly worsened bibasilar infiltrate.  Patient was given 500 cc LR, IV ceftriaxone and azithromycin.  The hospitalist service was consulted to admit for further evaluation and management.  Review of Systems: All systems reviewed and are negative except as documented in history of present illness above.   Past Medical History:  Diagnosis Date   Abnormal nuclear stress test    AF (paroxysmal atrial fibrillation) (HCC)    Age-related osteoporosis without current pathological fracture 05/16/2016   Alcoholism (HCC) 10/21/2018   Formatting of this note might be different from the original. 2 glasses a day.   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 Wrist surgery   Benign paroxysmal positional vertigo due to bilateral vestibular disorder 02/09/2020   Cancer of ascending colon [facility])    Carcinoma of ascending colon (HCC)    Cat allergies    Centrilobular emphysema (HCC) 05/16/2016   Chronic anemia 04/09/2021   Chronic atrial fibrillation, unspecified (HCC) 10/04/2020   Chronic obstructive pulmonary disease (HCC) 04/09/2021   Chronic pain 04/09/2021   CKD (chronic kidney disease) stage 3, GFR 30-59 ml/min (HCC) 01/04/2018   COPD (chronic obstructive pulmonary disease) (HCC)    Cysts of both ovaries 11/24/2019   Formatting of this note might be different from the original. On CT 11/2019. Referred to GYN.   Dyspnea    Early satiety 04/14/2019   Essential hypertension 08/22/2010  Formatting of this note might be different from the original. Formatting of this note might be different from the original. D/C ACE 08/22/2010 due to concerns with freq aecopd/ pseudoasthma > resolved  Last Assessment & Plan:  Formatting of this note might be different from the original. Adequate control on present rx, reviewed - note  she's no longer falling apart during her aecopd's which are less   Gastro-esophageal reflux disease without esophagitis 07/14/2017   Gastrointestinal hemorrhage    History of pulmonary embolus (PE) 07/14/2017   History of radiation therapy    Left lung- SBRT 11/13/20-11/27/20- Dr. Antony Blackbird   History of radiation therapy    Right and Left Lung 06/16/22-06/27/22- Dr. 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 Possible???? See CT comments 11/2019.   Other vitamin B12 deficiency anemias 04/09/2021   Paroxysmal atrial fibrillation (HCC) 04/14/2019   Formatting of this note might be different from the original. [facility].   Prediabetes 07/14/2017   Pulmonary embolism (HCC) 2017   Rheumatoid arthritis (HCC) 09/20/2010   Dr Nickola Major in [place].   Rheumatoid arthritis(714.0)    Dr Azzie Roup follows   Schatzki's ring    Senile purpura (HCC) 01/04/2018   Skin rash 07/14/2017   Bx'd Allergic. Likely meds.   Status post thoracentesis    Symptomatic anemia 07/25/2020   Type 2 diabetes mellitus without complications (HCC) 04/09/2021   Vitamin B12 deficiency 07/14/2017   Vitamin D deficiency 04/09/2021     Past Surgical History:  Procedure Laterality Date   BIOPSY  07/27/2020   Procedure: BIOPSY;  Surgeon: Iva Boop, MD;  Location: [facility] ENDOSCOPY;  Service: Endoscopy;;   BRONCHIAL BIOPSY  09/11/2020   Procedure: BRONCHIAL BIOPSIES;  Surgeon: Josephine Igo, DO;  Location: [facility] ENDOSCOPY;  Service: Pulmonary;;   BRONCHIAL BRUSHINGS  09/11/2020   Procedure: BRONCHIAL BRUSHINGS;  Surgeon: Josephine Igo, DO;  Location: [facility] ENDOSCOPY;  Service: Pulmonary;;   BRONCHIAL NEEDLE ASPIRATION BIOPSY  09/11/2020   Procedure: BRONCHIAL NEEDLE ASPIRATION BIOPSIES;  Surgeon: Josephine Igo, DO;  Location: [facility] ENDOSCOPY;  Service: Pulmonary;;   BRONCHIAL WASHINGS  09/11/2020   Procedure: BRONCHIAL WASHINGS;  Surgeon: Josephine Igo, DO;  Location: [facility] ENDOSCOPY;  Service: Pulmonary;;   COLONOSCOPY N/A 07/27/2020   Procedure: COLONOSCOPY;  Surgeon: Iva Boop, MD;  Location: [facility] ENDOSCOPY;  Service: Endoscopy;  Laterality: N/A;   CYSTECTOMY Right    breast   ESOPHAGOGASTRODUODENOSCOPY (EGD) WITH PROPOFOL N/A 07/27/2020   Procedure: ESOPHAGOGASTRODUODENOSCOPY (EGD) WITH PROPOFOL;  Surgeon: Iva Boop, MD;  Location: [facility] ENDOSCOPY;  Service: Endoscopy;  Laterality: N/A;   FIDUCIAL MARKER PLACEMENT  09/11/2020   Procedure: FIDUCIAL MARKER PLACEMENT;  Surgeon: Tonia Brooms,  Rachel Bo, DO;  Location: [facility] ENDOSCOPY;  Service: Pulmonary;;   LAPAROSCOPIC RIGHT HEMI COLECTOMY Right 07/31/2020   Procedure: LAPAROSCOPIC ASSISSTED  RIGHT HEMI COLECTOMY;  Surgeon: Manus Rudd, MD;  Location: [facility] OR;  Service: General;  Laterality: Right;   LEFT HEART CATH AND CORONARY ANGIOGRAPHY N/A 01/05/2019   Procedure: LEFT HEART CATH AND CORONARY ANGIOGRAPHY;  Surgeon: Corky Crafts, MD;  Location[facility] INVASIVE CV LAB;  Service: Cardiovascular;  Laterality: N/A;   SUBMUCOSAL TATTOO INJECTION  07/27/2020   Procedure: SUBMUCOSAL TATTOO INJECTION;  Surgeon: Iva Boop, MD;  Location: [facility] ENDOSCOPY;  Service: Endoscopy;;    VIDEO BRONCHOSCOPY WITH ENDOBRONCHIAL NAVIGATION Left 09/11/2020   Procedure: VIDEO BRONCHOSCOPY WITH ENDOBRONCHIAL NAVIGATION;  Surgeon: Josephine Igo, DO;  Location: [facility] ENDOSCOPY;  Service: Pulmonary;  Laterality: Left;  ION   VIDEO BRONCHOSCOPY WITH RADIAL ENDOBRONCHIAL ULTRASOUND  09/11/2020   Procedure: RADIAL ENDOBRONCHIAL ULTRASOUND;  Surgeon: Josephine Igo, DO;  Location: [facility] ENDOSCOPY;  Service: Pulmonary;;    Social History:  reports that she quit smoking about 21 years ago. Her smoking use included cigarettes. She started smoking about 61 years ago. She has a 40 pack-year smoking history. She has never used smokeless tobacco. She reports current alcohol use of about 2.0 standard drinks of alcohol per week. She reports that she does not use drugs.  Allergies  Allergen Reactions   Other Shortness Of Breath    Cats   Oxycodone Other (See Comments)    hallucinations    Family History  Problem Relation Age of Onset   Heart disease Father    Stroke Mother    Clotting disorder Mother    Heart disease Sister    Sleep apnea Sister    Emphysema Sister    Breast cancer Maternal Aunt    Emphysema Paternal Grandmother      Prior to Admission medications   Medication Sig Start Date End Date Taking? Authorizing Provider  acetaminophen (TYLENOL) 325 MG tablet Take 325 mg by mouth every 6 (six) hours as needed for moderate pain or headache.    [provider]  albuterol (VENTOLIN HFA) 108 (90 Base) MCG/ACT inhaler Inhale 2 puffs into the lungs every 4 (four) hours as needed for shortness of breath (shortness of breath (related to COPD)).    [provider]  amLODipine (NORVASC) 5 MG tablet Take 1 tablet (5 mg total) by mouth daily. Patient needs appointment for further refills. 1 st attempt 10/02/20   Tobb, Kardie, DO  amoxicillin (AMOXIL) 500 MG capsule Take 1 capsule (500 mg total) by mouth 3 (three) times daily for 3 days. 08/22/22 08/25/22  Lewie Chamber, MD   aspirin EC 81 MG tablet Take 81 mg by mouth daily.    [provider]  atorvastatin (LIPITOR) 20 MG tablet Take 1 tablet (20 mg total) by mouth daily. Patient needs to keep appointment for further refills. 1 st attempt Patient taking differently: Take 20 mg by mouth every evening. Patient needs to keep appointment for further refills. 1 st attempt 10/05/20   Tobb, Lavona Mound, DO  Budeson-Glycopyrrol-Formoterol (BREZTRI AEROSPHERE) 160-9-4.8 MCG/ACT AERO Inhale 2 puffs into the lungs 2 (two) times daily. 01/19/20   Icard, Rachel Bo, DO  carvedilol (COREG) 3.125 MG tablet Take 1 tablet (3.125 mg total) by mouth 2 (two) times daily with a meal. 08/13/20   Tobb, Kardie, DO  Cholecalciferol (D3-1000) 25 MCG (1000 UT) capsule Take 1,000 Units by mouth daily.    [provider]  doxycycline (VIBRA-TABS) 100 MG tablet Take 1 tablet (100 mg total) by mouth every 12 (twelve) hours for 3 days. 08/22/22 08/25/22  Lewie Chamber, MD  Ferrous Sulfate (IRON) 325 (65 Fe) MG TABS Take 1 tablet by mouth daily. 10/08/21   [provider]  fexofenadine (ALLEGRA) 180 MG tablet Take 180 mg by mouth daily.    [provider]  gabapentin (NEURONTIN) 100 MG capsule Take 100 mg by mouth 2 (two) times daily. 10/01/21   [provider]  hydroxychloroquine (PLAQUENIL) 200 MG tablet Take 200 mg by mouth daily. 09/17/15   [provider]  leflunomide (ARAVA) 10 MG tablet Take 10 mg by mouth daily. 04/05/21   [provider]  loperamide (IMODIUM A-D) 2 MG tablet Take 2 mg by mouth as needed for diarrhea or loose stools (MAX 10 PILLS/24 HRS.).    [provider]  meclizine (ANTIVERT) 12.5 MG tablet Take 12.5 mg by mouth 2 (two) times daily as needed for dizziness. 05/20/22   [provider]  mirtazapine (REMERON) 7.5 MG tablet Take 7.5 mg by mouth at bedtime.    [provider]  montelukast (SINGULAIR) 10 MG tablet Take 1 tablet (10 mg total) by mouth at  bedtime. 10/21/20   Icard, Rachel Bo, DO  omeprazole (PRILOSEC) 20 MG capsule Take 1 capsule (20 mg total) by mouth daily. 07/11/20   Icard, Rachel Bo, DO  ondansetron (ZOFRAN-ODT) 4 MG disintegrating tablet Take 1 tablet (4 mg total) by mouth every 8 (eight) hours as needed for nausea or vomiting. 08/05/20   Valetta Close, MD  OYSTERCAL-D 500-10 MG-MCG TABS Take 1 tablet by mouth daily. 03/06/22   [provider]  predniSONE (DELTASONE) 5 MG tablet Take 5 mg by mouth daily with breakfast.    [provider]  Sertraline HCl 150 MG CAPS Take 1 capsule by mouth daily. 04/09/22   [provider]  tiZANidine (ZANAFLEX) 2 MG tablet Take 2 mg by mouth 2 (two) times daily. 09/02/21   [provider]  traMADol (ULTRAM) 50 MG tablet Take 1 tablet (50 mg total) by mouth 3 (three) times daily as needed for moderate pain (pain). 06/18/22   Burnadette Pop, MD  valsartan (DIOVAN) 320 MG tablet Take 320 mg by mouth in the morning.    [provider]  vitamin B-12 (CYANOCOBALAMIN) 1000 MCG tablet Take 1,000 mcg by mouth daily.     [provider]    Physical Exam: Vitals:   08/25/22 1900 08/25/22 1903 08/25/22 2000 08/25/22 2046  BP: 126/66  (!) 143/74 139/60  Pulse: 97     Resp: 20  (!) 24 18  Temp:  98.3 F (36.8 C)  97.8 F (36.6 C)  TempSrc:    Oral  SpO2: 91%   94%  Weight:    45.7 kg  Height:    5' (1.524 m)   Constitutional: Frail elderly woman sitting up in bed.  NAD, calm, comfortable Eyes: EOMI, lids and conjunctivae normal ENMT: Mucous membranes are moist. Posterior pharynx clear of any exudate or lesions.Normal dentition.  Neck: normal, supple, no masses. Respiratory: Distant breath sounds with coarse expiratory wheezing throughout. Normal respiratory effort while on 3 L O2 via [place]. No accessory muscle use.  Cardiovascular: Regular rate and rhythm, no murmurs / rubs / gallops. No extremity edema. 2+ pedal pulses. Abdomen: no tenderness, no  masses palpated. Musculoskeletal: no clubbing / cyanosis. No joint deformity upper and lower extremities. Good ROM, no contractures. Normal muscle  tone.  Skin: no rashes, lesions, ulcers. No induration Neurologic: Sensation intact. Strength 5/5 in all 4.  Psychiatric: Alert and oriented x 3. Normal mood.   EKG: Personally reviewed. Sinus rhythm, rate 99, no acute ischemic changes.  Rate is slower when compared to previous.  Assessment/Plan Principal Problem:   Acute on chronic hypoxic respiratory failure (HCC) Active Problems:   CAP (community acquired pneumonia)   Mixed hyperlipidemia   Rheumatoid arthritis (HCC)   Iron deficiency anemia due to chronic blood loss   Essential hypertension   History of radiation therapy   Chronic obstructive pulmonary disease (HCC)   AF (paroxysmal atrial fibrillation) (HCC)   Desiree [name] is a 77 y.o. 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  Assessment and Plan: Acute on chronic hypoxic respiratory failure secondary to acute COPD exacerbation: Suspect acute presentation is more secondary to COPD exacerbation rather than worsening pneumonia.  Significant coarse expiratory wheezing throughout the lung fields noted on exam.  SpO2 as low as 74% on 4 L via [place] PTA, now stable again on home 3 L O2 via . -Brovana/Pulmicort BID -DuoNeb as needed -Prednisone 40 mg daily, continue Singulair -On IV ceftriaxone and azithromycin -Continue home supplemental O2 at 3 L via   Community-acquired pneumonia: Recent admission for this  and treated appropriately with antibiotics but did not receive oral meds to complete course at her facility.  CXR with persistent bibasilar infiltrates.  As above, suspect acute presentation more in line with COPD exacerbation rather than treatment failure of pneumonia. -Will continue on IV ceftriaxone and azithromycin for now -Continue management of COPD as above  Squamous cell lung cancer (LLL and RLL nodule): Recently completed radiation treatment.  Follows with pulmonology, Dr. Tonia Brooms.  Iron deficiency anemia due to chronic blood loss: Hemoglobin stable at 9.1.  Paroxysmal atrial fibrillation: In sinus rhythm on admission.  Not on anticoagulation due to history of bleeding and fall risk.  Continue Coreg.  Mild CAD: Denies chest pain.  Continue Coreg, aspirin, and atorvastatin  Rheumatoid arthritis: Continue Plaquenil and Arava.  On chronic prednisone 5 mg daily, increased to 40 mg daily for now for COPD exacerbation.  Hypertension: Continue amlodipine, Coreg, valsartan.  Hyperlipidemia: Continue atorvastatin.   DVT prophylaxis: heparin injection 5,000 Units Start: 08/25/22 2200 Code Status: DNR, confirmed with patient and daughter on admission Family Communication: Daughter at bedside Disposition Plan: From SNF and likely return to same facility pending clinical progress Consults called: None Severity of Illness: The appropriate patient status for this patient is INPATIENT. Inpatient status is judged to be reasonable and necessary in order to provide the required intensity of service to ensure the patient's safety. The patient's presenting symptoms, physical exam findings, and initial radiographic and laboratory data in the context of their chronic comorbidities is felt to place them at high risk for further clinical deterioration. Furthermore, it is not anticipated that the patient will be medically stable for discharge from the hospital within 2 midnights of admission.   * I  certify that at the point of admission it is my clinical judgment that  the patient will require inpatient hospital care spanning beyond 2 midnights from the point of admission due to high intensity of service, high risk for further deterioration and high frequency of surveillance required.Darreld Mclean MD [facility]  If 7PM-7AM, please contact night-coverage [email or web address]  08/25/2022, 9:02 PM

## 2022-08-25 NOTE — Hospital Course (Signed)
Desiree [name] is a 77 y.o. 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

## 2022-08-25 NOTE — ED Triage Notes (Signed)
Pt was bib EMS. Was just discharged on 7/19 with a dx of pneumonia. Pt just received meds at 3 pm yesterday. And was out of them for 36 hours. O2 Sats was in the 80s was on 4L/[place] when EMS arrived. She was given 1 Albuterol tx, Duoneb, and 125mg  of Solumedrol in a 22g in LFA. Pt sounded tight in lower lobes and has began to open up after meds given. VS B/P 140/60, P 100, R 20, O2Sats 100, Duoneb still going, CBG 122.

## 2022-08-26 DIAGNOSIS — I480 Paroxysmal atrial fibrillation: Secondary | ICD-10-CM

## 2022-08-26 DIAGNOSIS — J9621 Acute and chronic respiratory failure with hypoxia: Secondary | ICD-10-CM | POA: Diagnosis not present

## 2022-08-26 DIAGNOSIS — J189 Pneumonia, unspecified organism: Secondary | ICD-10-CM | POA: Diagnosis not present

## 2022-08-26 DIAGNOSIS — E440 Moderate protein-calorie malnutrition: Secondary | ICD-10-CM

## 2022-08-26 HISTORY — DX: Moderate protein-calorie malnutrition: E44.0

## 2022-08-26 LAB — CBC
HCT: 27.6 % — ABNORMAL LOW (ref 36.0–46.0)
Hemoglobin: 8.4 g/dL — ABNORMAL LOW (ref 12.0–15.0)
MCH: 31.1 pg (ref 26.0–34.0)
MCHC: 30.4 g/dL (ref 30.0–36.0)
MCV: 102.2 fL — ABNORMAL HIGH (ref 80.0–100.0)
Platelets: 226 10*3/uL (ref 150–400)
RBC: 2.7 MIL/uL — ABNORMAL LOW (ref 3.87–5.11)
RDW: 12.8 % (ref 11.5–15.5)
WBC: 7.1 10*3/uL (ref 4.0–10.5)
nRBC: 0 % (ref 0.0–0.2)

## 2022-08-26 LAB — BASIC METABOLIC PANEL
Anion gap: 7 (ref 5–15)
BUN: 28 mg/dL — ABNORMAL HIGH (ref 8–23)
CO2: 27 mmol/L (ref 22–32)
Calcium: 8.6 mg/dL — ABNORMAL LOW (ref 8.9–10.3)
Chloride: 103 mmol/L (ref 98–111)
Creatinine, Ser: 0.83 mg/dL (ref 0.44–1.00)
GFR, Estimated: >60 mL/min (ref >60)
Glucose, Bld: 113 mg/dL — ABNORMAL HIGH (ref 70–99)
Potassium: 4.2 mmol/L (ref 3.5–5.1)
Sodium: 137 mmol/L (ref 135–145)

## 2022-08-26 LAB — CULTURE, BLOOD (ROUTINE X 2)

## 2022-08-26 NOTE — Plan of Care (Signed)

## 2022-08-26 NOTE — Evaluation (Signed)
Physical Therapy Evaluation Patient Details Name: Desiree [name] MRN: [id number] DOB: [birth year] Today's Date: 08/26/2022  History of Present Illness  77 yo female whom presented to ED on 08/25/2022 due to progressive SOB, CXR revealed low lung volumes and slightly worsened bibasilar infiltrate.  Pt recently admitted to hospital 7/16-7/19/2024 due to generalized weakness and SOB and was found to have PNA. Pt d/c to ALF and concerns per continuation of ABX. Pt PMH includes but is not limited to:  emphysema, A -fib, CKD III,  PE, neuropathy, colon CA, lung CA, HTN, falls, COPD on 3 L/min supplemental O2 at baseline.  Clinical Impression   Pt admitted with above diagnosis.  Pt currently with functional limitations due to the deficits listed below (see PT Problem List). Pt seated in recliner when PT arrived, daughter present. Pt awake and alert, noted use of accessory mm for breathing strategies and pt required cues for pursed lip breathing t/o eval. Pt agreeable to therapy session and is motivated to return to [facility] and resume therapy services. Pt required cues and min A for sit to stand  from recliner x 3, posterior lean with initial standing and limited standing tolerance. Pt required min guard for gait tasks with RW and recliner close 8 feet on 4 L/min and O2 saturation 94-95% once seated with PR 122. Second amb bout as above 8 feet with RW on 3 L/min and O2 saturation 94-95% and PR 125. Pt required increased time and seated therapeutic rest break between amb bouts. PT assisted pt with ADL task prior to exiting room. Pt left in recliner, all needs in place, daughter present and on 4 L/min. PT communicated with nursing staff per gait assessment and O2 saturation as well as A x 1 for mobility tasks. Pt will benefit from acute skilled PT to increase their independence and safety with mobility to allow discharge.          Assistance Recommended at Discharge Intermittent Supervision/Assistance  If  plan is discharge home, recommend the following:  Can travel by private vehicle  A little help with walking and/or transfers;Assistance with cooking/housework;A lot of help with bathing/dressing/bathroom;Direct supervision/assist for medications management;Assist for transportation;Help with stairs or ramp for entrance        Equipment Recommendations None recommended by PT  Recommendations for Other Services       Functional Status Assessment Patient has had a recent decline in their functional status and demonstrates the ability to make significant improvements in function in a reasonable and predictable amount of time.     Precautions / Restrictions Precautions Precautions: Fall      Mobility  Bed Mobility               General bed mobility comments: pt seated in recliner when PT arrived. pt and daughter indicate required A for bed mobility today    Transfers Overall transfer level: Needs assistance Equipment used: Rolling walker (2 wheels) Transfers: Sit to/from Stand Sit to Stand: Min assist           General transfer comment: cues for scooting anteriorly in recliner, push to stand and anterior weight shift with intital standing due to posterior lean    Ambulation/Gait Ambulation/Gait assistance: Min guard Gait Distance (Feet): 8 Feet Assistive device: Rolling walker (2 wheels) Gait Pattern/deviations: Step-to pattern, Trunk flexed Gait velocity: decreased     General Gait Details: recliner close, amb assessed on 4 L/min and 3 L/min with pt O2 saturation >/=94% with both trials, pt  PR increased into the 120s and pt exhibited S and S of SOB required cues for slowed RR and pursed lip breathing. gait tolerance limited due to fatigue and SOB  Stairs            Wheelchair Mobility     Tilt Bed    Modified Rankin (Stroke Patients Only)       Balance Overall balance assessment: Needs assistance, History of Falls Sitting-balance support: Feet  supported Sitting balance-Leahy Scale: Fair     Standing balance support: Bilateral upper extremity supported, During functional activity, Reliant on assistive device for balance Standing balance-Leahy Scale: Poor Standing balance comment: posterior lean                             Pertinent Vitals/Pain Pain Assessment Pain Assessment: No/denies pain    Home Living Family/patient expects to be discharged to:: Assisted living (pt and daughter report participating with PT at ALF)                 Home Equipment: Rollator (4 wheels) Additional Comments: 3L O2 use    Prior Function Prior Level of Function : Needs assist       Physical Assist : Mobility (physical);ADLs (physical) Mobility (physical): Transfers;Gait;Bed mobility ADLs (physical): Bathing;Dressing;Toileting;IADLs Mobility Comments: pt able to amb short distances with rollator in personal room with ALF staff, assist for bed mobility and transfer tasks. pt and daughter reported pt able to amb with PT distance to dining room with rollator rest and when returning to room required one seated rest break on rollator       Hand Dominance   Dominant Hand: Right    Extremity/Trunk Assessment        Lower Extremity Assessment Lower Extremity Assessment: Generalized weakness    Cervical / Trunk Assessment Cervical / Trunk Assessment: Kyphotic  Communication   Communication: No difficulties  Cognition Arousal/Alertness: Awake/alert Behavior During Therapy: WFL for tasks assessed/performed Overall Cognitive Status: Within Functional Limits for tasks assessed                                          General Comments      Exercises     Assessment/Plan    PT Assessment Patient needs continued PT services  PT Problem List Decreased strength;Decreased activity tolerance;Decreased balance;Decreased mobility;Decreased coordination;Cardiopulmonary status limiting activity        PT Treatment Interventions DME instruction;Gait training;Functional mobility training;Therapeutic activities;Therapeutic exercise;Balance training;Neuromuscular re-education;Patient/family education    PT Goals (Current goals can be found in the Care Plan section)  Acute Rehab PT Goals Patient Stated Goal: to get stronger and go home PT Goal Formulation: With patient Time For Goal Achievement: 09/16/22 Potential to Achieve Goals: Good    Frequency Min 1X/week     Co-evaluation               AM-PAC PT "6 Clicks" Mobility  Outcome Measure Help needed turning from your back to your side while in a flat bed without using bedrails?: A Little Help needed moving from lying on your back to sitting on the side of a flat bed without using bedrails?: A Little Help needed moving to and from a bed to a chair (including a wheelchair)?: A Little Help needed standing up from a chair using your arms (e.g., wheelchair or bedside chair)?: A Little  Help needed to walk in hospital room?: A Little Help needed climbing 3-5 steps with a railing? : Total 6 Click Score: 16    End of Session Equipment Utilized During Treatment: Gait belt;Oxygen Activity Tolerance: Patient limited by fatigue (SOB) Patient left: in chair;with call bell/phone within reach;with chair alarm set;with family/visitor present Nurse Communication: Mobility status;Other (comment) (O2 saturation) PT Visit Diagnosis: Unsteadiness on feet (R26.81);Repeated falls (R29.6);Muscle weakness (generalized) (M62.81);Difficulty in walking, not elsewhere classified (R26.2)    Time: 1610-9604 PT Time Calculation (min) (ACUTE ONLY): 54 min   Charges:   PT Evaluation $PT Eval Moderate Complexity: 1 Mod PT Treatments $Gait Training: 8-22 mins $Therapeutic Activity: 23-37 mins PT General Charges $$ ACUTE PT VISIT: 1 Visit         Johnny Bridge, PT Acute Rehab   Jacqualyn Posey 08/26/2022, 5:36 PM

## 2022-08-26 NOTE — Progress Notes (Signed)
Mobility Specialist - Progress Note   08/26/22 1423  Mobility  Activity Transferred from bed to chair  Level of Assistance Contact guard assist, steadying assist  Assistive Device Front wheel walker  Distance Ambulated (ft) 3 ft  Range of Motion/Exercises Active  Activity Response Tolerated well  Mobility Referral Yes  $Mobility charge 1 Mobility  Mobility Specialist Start Time (ACUTE ONLY) 1408  Mobility Specialist Stop Time (ACUTE ONLY) 1423  Mobility Specialist Time Calculation (min) (ACUTE ONLY) 15 min   Pt received in bed and agreed to mobility, pt transfers at baseline, did orthostatics with RN.  Pt was Min A for sit to stand, twice. Small steps with unsteadiness to chair and was left with all needs met. Alarm on and family in room.  Marilynne Halsted Mobility Specialist

## 2022-08-26 NOTE — Plan of Care (Signed)

## 2022-08-26 NOTE — Progress Notes (Signed)
Initial Nutrition Assessment  DOCUMENTATION CODES:   Non-severe (moderate) malnutrition in context of chronic illness  INTERVENTION:  - Liberalize to Regular diet to avoid restricting intake given malnutrition. - Ensure Plus High Protein po BID, each supplement provides 350 kcal and 20 grams of protein. - Encourage intake at all meals and of supplements.  - Monitor weight trends.  NUTRITION DIAGNOSIS:   Moderate Malnutrition related to chronic illness (squamous cell lung cancer) as evidenced by mild fat depletion, mild muscle depletion.  GOAL:   Patient will meet greater than or equal to 90% of their needs  MONITOR:   PO intake, Supplement acceptance, Weight trends  REASON FOR ASSESSMENT:   Malnutrition Screening Tool    ASSESSMENT:   77 y.o. 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  Patient endorses a UBW of 125# and that she has lost weight over the past 3 weeks. States weight was stable prior to this. Per EMR, patient's highest recorded weight was at 112# in October 2023. She dropped to 104# by March (8# or 7% loss in 5 months, not significant) but weight since then without significant changes.   Patient endorses eating very well at home. Has a muffin and fruit for breakfast, soup and a salad for lunch, and a cheeseburger patty with a baked potato and a salad for dinner. Appetite has been good recently.   Current appetite normal and patient endorses eating well at breakfast. Had 100% although thinks she only had a banana and an orange. Encouraged patient to eat well with 3 meals a day. She is agreeable to try Ensure during admission.   Medications  reviewed and include: -  Labs reviewed:  -   NUTRITION - FOCUSED PHYSICAL EXAM:  Flowsheet Row Most Recent Value  Orbital Region Mild depletion  Upper Arm Region Mild depletion  Thoracic and Lumbar Region Mild depletion  Buccal Region No depletion  Temple Region Mild depletion  Clavicle Bone Region Mild depletion  Clavicle and Acromion Bone Region Moderate depletion  Scapular Bone Region Unable to assess  Dorsal Hand Mild depletion  Patellar Region Mild depletion  Anterior Thigh Region Mild depletion  Posterior Calf Region Moderate depletion  Edema (RD Assessment) None  Hair Reviewed  Eyes Reviewed  Mouth Reviewed  Skin Reviewed  Nails Reviewed       Diet Order:   Diet Order             Diet regular Room service appropriate? Yes; Fluid consistency: Thin  Diet effective now                   EDUCATION NEEDS:  Education needs have been addressed  Skin:  Skin Assessment: Reviewed RN Assessment  Last BM:  PTA  Height:  Ht Readings from Last 1 Encounters:  08/25/22 5' (1.524 m)   Weight:  Wt Readings from Last 1 Encounters:  08/25/22 45.7 kg    BMI:  Body mass index is 19.68 kg/m.  Estimated Nutritional Needs:  Kcal:  1500-1650 kcals Protein:  60-75 grams Fluid:  >/= 1.5L    Shelle Iron RD, LDN For contact information, refer to [facility].

## 2022-08-26 NOTE — Progress Notes (Signed)
Progress Note    Desiree [name]   MVH:[id number]  DOB: 18-May-1945  DOA: 08/25/2022     1 PCP: Patient, No Pcp Per  Initial CC: SOB, hypoxia  Hospital Course: Desiree [name] is a 77 y.o. 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  Interval History:  Abx not delivered in timely fashion from mail order s/p discharge and patient developed worsening respi status with probable emphysema exac due to this. Found to be hypoxic and SOB and sent back to hospital from [facility].  Feeling better after abx and increased dose prednisone since admission.   Assessment and Plan:  CAP (community acquired pneumonia) - course wasn't continued at discharge unfortunately - continue on rocephin/azithro and should be completed with course technically in 1-2 days despite interruption   Lung cancer, lower lobe (HCC) - just completed radiation treatment with rad onc; last seen 08/04/22 - also follows with Dr. 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  Not on anticoagulation due to history of bleeding and fall risk.  Continue Coreg   Hypomagnesemia - Repleted   Type 2 diabetes mellitus without complications (HCC) - A1c 4.9% on 08/20/23 - diet control    Iron deficiency anemia due to chronic blood  loss - Continue ferrous sulfate   Idiopathic peripheral neuropathy - Continue gabapentin   Mild CAD - Continue aspirin, statin, ARB   Rheumatoid arthritis (HCC) - On chronic prednisone 5 mg daily -Also on Plaquenil   Mixed hyperlipidemia - Continue Lipitor   Gastro-esophageal reflux disease without esophagitis - Continue Protonix   Hypertension - Continue amlodipine, Coreg, irbesartan  Old records reviewed in assessment of this patient  Antimicrobials: Azithromycin 08/25/2022 >> current Rocephin 08/25/2022 >> current  DVT prophylaxis:  heparin injection 5,000 Units Start: 08/25/22 2200   Code Status:   Code Status: DNR  Mobility Assessment (Last 72 Hours)     Mobility Assessment     Row Name 08/25/22 2040           Does patient have an order for bedrest or is patient medically unstable No - Continue assessment       What is the highest level of mobility based on the progressive mobility assessment? Level 3 (Stands with assist) - Balance while standing  and cannot march in place       Is the above level different from baseline mobility prior to current illness? Yes - Recommend PT order                Barriers to discharge:  Disposition Plan:  [place] 1-2 days Status is: Inpt  Objective: Blood pressure (!) 161/79, pulse 95, temperature (!) 97.3 F (36.3 C), temperature source Oral, resp. rate (!) 22, height 5' (1.524 m), weight 45.7 kg, SpO2 96%.  Examination:  Physical Exam Constitutional:      General: She is not  in acute distress.    Appearance: Normal appearance.  HENT:     Head: Normocephalic and atraumatic.     Mouth/Throat:     Mouth: Mucous membranes are moist.  Eyes:     Extraocular Movements: Extraocular movements intact.  Cardiovascular:     Rate and Rhythm: Normal rate and regular rhythm.  Pulmonary:     Effort: Pulmonary effort is normal. No respiratory distress.     Breath sounds: No wheezing.     Comments: Coarse sounds  bilaterally; better air movement Abdominal:     General: Bowel sounds are normal. There is no distension.     Palpations: Abdomen is soft.     Tenderness: There is no abdominal tenderness.  Musculoskeletal:        General: Normal range of motion.     Cervical back: Normal range of motion and neck supple.  Skin:    General: Skin is warm and dry.  Neurological:     General: No focal deficit present.     Mental Status: She is alert.  Psychiatric:        Mood and Affect: Mood normal.      Consultants:    Procedures:    Data Reviewed: Results for orders placed or performed during the hospital encounter of 08/25/22 (from the past 24 hour(s))  CBC with Differential     Status: Abnormal   Collection Time: 08/25/22  3:24 PM  Result Value Ref Range   WBC 11.9 (H) 4.0 - 10.5 K/uL   RBC 3.03 (L) 3.87 - 5.11 MIL/uL   Hemoglobin 9.1 (L) 12.0 - 15.0 g/dL   HCT 40.3 (L) 47.4 - 25.9 %   MCV 102.3 (H) 80.0 - 100.0 fL   MCH 30.0 26.0 - 34.0 pg   MCHC 29.4 (L) 30.0 - 36.0 g/dL   RDW 56.3 87.5 - 64.3 %   Platelets 284 150 - 400 K/uL   nRBC 0.0 0.0 - 0.2 %   Neutrophils Relative % 84 %   Neutro Abs 10.1 (H) 1.7 - 7.7 K/uL   Lymphocytes Relative 5 %   Lymphs Abs 0.6 (L) 0.7 - 4.0 K/uL   Monocytes Relative 4 %   Monocytes Absolute 0.5 0.1 - 1.0 K/uL   Eosinophils Relative 5 %   Eosinophils Absolute 0.6 (H) 0.0 - 0.5 K/uL   Basophils Relative 1 %   Basophils Absolute 0.1 0.0 - 0.1 K/uL   Immature Granulocytes 1 %   Abs Immature Granulocytes 0.06 0.00 - 0.07 K/uL  Comprehensive metabolic panel     Status: Abnormal   Collection Time: 08/25/22  3:24 PM  Result Value Ref Range   Sodium 137 135 - 145 mmol/L   Potassium 4.2 3.5 - 5.1 mmol/L   Chloride 100 98 - 111 mmol/L   CO2 28 22 - 32 mmol/L   Glucose, Bld 118 (H) 70 - 99 mg/dL   BUN 30 (H) 8 - 23 mg/dL   Creatinine, Ser 3.29 (H) 0.44 - 1.00 mg/dL   Calcium 8.7 (L) 8.9 - 10.3 mg/dL   Total Protein 6.6 6.5 - 8.1 g/dL   Albumin 3.0  (L) 3.5 - 5.0 g/dL   AST 15 15 - 41 U/L   ALT 11 0 - 44 U/L   Alkaline Phosphatase 89 38 - 126 U/L   Total Bilirubin 0.5 0.3 - 1.2 mg/dL   GFR, Estimated 55 (L) >60 mL/min   Anion gap 9 5 - 15  Lipase, blood  Status: Abnormal   Collection Time: 08/25/22  3:24 PM  Result Value Ref Range   Lipase 52 (H) 11 - 51 U/L  Troponin I (High Sensitivity)     Status: Abnormal   Collection Time: 08/25/22  3:24 PM  Result Value Ref Range   Troponin I (High Sensitivity) 25 (H) <18 ng/L  Resp panel by RT-PCR (RSV, Flu A&B, Covid) Anterior Nasal Swab     Status: None   Collection Time: 08/25/22  3:24 PM   Specimen: Anterior Nasal Swab  Result Value Ref Range   SARS Coronavirus 2 by RT PCR NEGATIVE NEGATIVE   Influenza A by PCR NEGATIVE NEGATIVE   Influenza B by PCR NEGATIVE NEGATIVE   Resp Syncytial Virus by PCR NEGATIVE NEGATIVE  Brain natriuretic peptide     Status: Abnormal   Collection Time: 08/25/22  3:24 PM  Result Value Ref Range   B Natriuretic Peptide 212.4 (H) 0.0 - 100.0 pg/mL  Troponin I (High Sensitivity)     Status: Abnormal   Collection Time: 08/25/22  6:50 PM  Result Value Ref Range   Troponin I (High Sensitivity) 19 (H) <18 ng/L  Blood culture (routine x 2)     Status: None (Preliminary result)   Collection Time: 08/25/22  6:50 PM   Specimen: BLOOD  Result Value Ref Range   Specimen Description      BLOOD SITE NOT SPECIFIED Performed at [facility], [address] [place], [place] [address]    Special Requests      BOTTLES DRAWN AEROBIC ONLY Blood Culture results may not be optimal due to an inadequate volume of blood received in culture bottles Performed at [facility], [address] [place], [place] [address]    Culture      NO GROWTH < 12 HOURS Performed at [facility], [address] [place], [place] [address]    Report Status PENDING   Blood culture (routine x 2)     Status: None (Preliminary result)    Collection Time: 08/25/22  6:50 PM   Specimen: BLOOD  Result Value Ref Range   Specimen Description      BLOOD SITE NOT SPECIFIED Performed at [facility], [address] [place] [address] [place], [place] [address]    Special Requests      BOTTLES DRAWN AEROBIC ONLY Blood Culture results may not be optimal due to an inadequate volume of blood received in culture bottles Performed at [facility], [address], [place], [place] [address]    Culture      NO GROWTH < 12 HOURS Performed at [facility], [address] [place], [place] [address]    Report Status PENDING   Glucose, capillary     Status: Abnormal   Collection Time: 08/25/22  8:49 PM  Result Value Ref Range   Glucose-Capillary 184 (H) 70 - 99 mg/dL  CBC     Status: Abnormal   Collection Time: 08/26/22  4:15 AM  Result Value Ref Range   WBC 7.1 4.0 - 10.5 K/uL   RBC 2.70 (L) 3.87 - 5.11 MIL/uL   Hemoglobin 8.4 (L) 12.0 - 15.0 g/dL   HCT 53.6 (L) 64.4 - 03.4 %   MCV 102.2 (H) 80.0 - 100.0 fL   MCH 31.1 26.0 - 34.0 pg   MCHC 30.4 30.0 - 36.0 g/dL   RDW 74.2 59.5 - 63.8 %   Platelets 226 150 - 400 K/uL  nRBC 0.0 0.0 - 0.2 %  Basic metabolic panel     Status: Abnormal   Collection Time: 08/26/22  4:15 AM  Result Value Ref Range   Sodium 137 135 - 145 mmol/L   Potassium 4.2 3.5 - 5.1 mmol/L   Chloride 103 98 - 111 mmol/L   CO2 27 22 - 32 mmol/L   Glucose, Bld 113 (H) 70 - 99 mg/dL   BUN 28 (H) 8 - 23 mg/dL   Creatinine, Ser 8.65 0.44 - 1.00 mg/dL   Calcium 8.6 (L) 8.9 - 10.3 mg/dL   GFR, Estimated >78 >46 mL/min   Anion gap 7 5 - 15    I have reviewed pertinent nursing notes, vitals, labs, and images as necessary. I have ordered labwork to follow up on as indicated.  I have reviewed the last notes from staff over past 24 hours. I have discussed patient's care plan and test results with nursing staff, CM/SW, and other staff as appropriate.  Time spent: Greater than 50% of the 55  minute visit was spent in counseling/coordination of care for the patient as laid out in the A&P.   LOS: 1 day   Lewie Chamber, MD [facility] 08/26/2022, 11:25 AM

## 2022-08-26 NOTE — Progress Notes (Signed)
This RN assisted the Mobility Specialist during recent mobility session. Per daughter's request patient ambulatory sats were checked during the session. Patient able to tolerate lying, sitting on edge of bed, standing, then standing to transfer to chair. Patient's O2 saturations remained 94-98% on 4L and daughter was also there at the bedside during the entire session.

## 2022-08-26 NOTE — TOC Initial Note (Addendum)
Transition of Care [facility]) - Initial/Assessment Note    Patient Details  Name: Desiree [name] MRN: [id number] Date of Birth: [birth year]  Transition of Care [facility]) CM/SW Contact:    Howell Rucks, RN Phone Number: 08/26/2022, 9:53 AM  Clinical Narrative:  [facility] from [facility], per attending teams chat patient did not receive her medications upon return to facility which contributed to her readmission. NCM called to [facility], sw Victor, confirmed pt is ALF resident, confirmed pt able to return as long as she is cleared by our PT. Yehuda Mao confirmed facilities pharmacy did not deliver pt's medications timely, NCM confirmed hospital will ensure patient has medications needed prior to discharge back to facility. Will need updated FL2. PT eval pending, await recommendations.                   -12:12pm Met with pt at bedside to introduce role of TOC/NCM and review for dc planning. Pt confirmed she plans to return to [facility]. Pt reports she has w/c and walker, home 02 through [employer], reports she has portable 02 and will ask her dtr to bring to the hospital.  TOC will continue to follow.         Patient Goals and CMS Choice            Expected Discharge Plan and Services                                              Prior Living Arrangements/Services                       Activities of Daily Living Home Assistive Devices/Equipment: Environmental consultant (specify type), Eyeglasses, Oxygen ADL Screening (condition at time of admission) Patient's cognitive ability adequate to safely complete daily activities?: Yes Is the patient deaf or have difficulty hearing?: Yes Does the patient have difficulty seeing, even when wearing glasses/contacts?: No Does the patient have difficulty concentrating, remembering, or making decisions?: No Patient able to express need for assistance with ADLs?: Yes Does the patient have difficulty dressing or  bathing?: Yes Independently performs ADLs?: No Communication: Independent Dressing (OT): Needs assistance Is this a change from baseline?: Pre-admission baseline Grooming: Needs assistance Is this a change from baseline?: Pre-admission baseline Feeding: Independent Is this a change from baseline?: Pre-admission baseline Bathing: Needs assistance Is this a change from baseline?: Pre-admission baseline Toileting: Needs assistance Is this a change from baseline?: Pre-admission baseline In/Out Bed: Needs assistance Is this a change from baseline?: Pre-admission baseline Walks in Home: Needs assistance Is this a change from baseline?: Pre-admission baseline Does the patient have difficulty walking or climbing stairs?: Yes Weakness of Legs: None Weakness of Arms/Hands: None  Permission Sought/Granted                  Emotional Assessment              Admission diagnosis:  Pneumonia due to infectious organism, unspecified laterality, unspecified part of lung [J18.9] Acute on chronic hypoxic respiratory failure (HCC) [J96.21] Patient Active Problem List   Diagnosis Date Noted   Acute on chronic hypoxic respiratory failure (HCC) 08/25/2022   CAP (community acquired pneumonia) 08/20/2022   Hypomagnesemia 08/20/2022   UTI (urinary tract infection) 06/15/2022   AF (paroxysmal atrial fibrillation) (HCC) 10/10/2021   History of radiation therapy 04/09/2021  Chronic pain 04/09/2021   Hypothyroidism 04/09/2021   Type 2 diabetes mellitus without complications (HCC) 04/09/2021   Vitamin D deficiency 04/09/2021   Malignant tumor of ascending colon (HCC) 04/09/2021   Chronic obstructive pulmonary disease (HCC) 04/09/2021   Malignant neoplasm of unspecified part of left bronchus or lung (HCC) 04/09/2021   Atherosclerotic heart disease of native coronary artery without angina pectoris 04/09/2021   Chronic anemia 04/09/2021   Other vitamin B12 deficiency anemias 04/09/2021    Chronic atrial fibrillation, unspecified (HCC) 10/04/2020   Carcinoma of ascending colon (HCC) 10/04/2020   Dyspnea 10/04/2020   Lung cancer, lower lobe (HCC) 09/24/2020   Lung nodule, solitary 08/16/2020   Status post thoracentesis    Schatzki's ring    Cancer of ascending colon (HCC)    Gastrointestinal hemorrhage    Cat allergies 07/26/2020   Symptomatic anemia 07/25/2020   Idiopathic peripheral neuropathy 02/23/2020   Benign paroxysmal positional vertigo due to bilateral vestibular disorder 02/09/2020   Cysts of both ovaries 11/24/2019   Other cirrhosis of liver (HCC) 11/24/2019   Early satiety 04/14/2019   Paroxysmal atrial fibrillation (HCC) 04/14/2019   Mild CAD 02/02/2019   Abnormal nuclear stress test    Iron deficiency anemia due to chronic blood loss 10/27/2018   Alcoholism (HCC) 10/21/2018   CKD (chronic kidney disease) stage 3, GFR 30-59 ml/min (HCC) 01/04/2018   Senile purpura (HCC) 01/04/2018   At high risk for falls 07/14/2017   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HCC) 07/14/2017   Gastro-esophageal reflux disease without esophagitis 07/14/2017   History of pulmonary embolus (PE) 07/14/2017   Malaise and fatigue 07/14/2017   Mild episode of recurrent major depressive disorder (HCC) 07/14/2017   Mixed hyperlipidemia 07/14/2017   Prediabetes 07/14/2017   Skin rash 07/14/2017   Vitamin B12 deficiency 07/14/2017   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   Multiple lung nodules on CT 05/16/2016   Osteoporosis 05/16/2016   Centrilobular emphysema (HCC) 05/16/2016   Age-related osteoporosis without current pathological fracture 05/16/2016   Allergic rhinitis 07/17/2015   Pulmonary embolism (HCC) 11/23/2014   Rheumatoid arthritis(714.0) 09/20/2010   Rheumatoid arthritis (HCC) 09/20/2010   COPD (chronic obstructive pulmonary disease) (HCC) 08/22/2010   Hypertension 08/22/2010   Essential hypertension 08/22/2010   PCP:  Patient, No Pcp Per Pharmacy:    [facility] [place], [place] - [address] [place] [address] Phone: [phone] Fax: [phone]  [place] [facility] [address] [place] [address] Phone: [phone] Fax: [phone]     Social Determinants of Health (SDOH) Social History: SDOH Screenings   Food Insecurity: No Food Insecurity (08/25/2022)  Housing: Low Risk  (08/25/2022)  Transportation Needs: No Transportation Needs (08/25/2022)  Utilities: Not At Risk (08/25/2022)  Financial Resource Strain: Low Risk  (10/26/2020)  Social Connections: Socially Isolated (10/26/2020)  Tobacco Use: Medium Risk (08/25/2022)   SDOH Interventions:     Readmission Risk Interventions    08/26/2022    9:53 AM 06/17/2022   12:54 PM  Readmission Risk Prevention Plan  Transportation Screening  Complete  PCP or Specialist Appt within 5-7 Days  Complete  Home Care Screening  Complete  Medication Review (RN Care Manager) Complete   HRI or Home Care Consult Complete   Palliative Care Screening Not Applicable   Skilled Nursing Facility Not Applicable

## 2022-08-27 DIAGNOSIS — J9621 Acute and chronic respiratory failure with hypoxia: Secondary | ICD-10-CM | POA: Diagnosis not present

## 2022-08-27 LAB — CULTURE, BLOOD (ROUTINE X 2)

## 2022-08-27 NOTE — TOC Progression Note (Addendum)
Transition of Care [facility]) - Progression Note    Patient Details  Name: Desiree [name] MRN: [id number] Date of Birth: 22-Jul-1945  Transition of Care [facility]) CM/SW Contact  Howell Rucks, RN Phone Number: 08/27/2022, 12:10 PM  Clinical Narrative: PT eval completed, recommendation for [facility] PT at ALF. [facility], states to call [facility] for [facility] PT. NCM call to [facility], sw Al[facility]ino [facility], initiated [facility] PT referral, reports she will call [facility] for pt's PCP as none listed, reports she has access to EPIC to review records, states [facility] PT order is required. NCM sent request to attending to enter [facility] PT order and place face to face. TOC will continue to follow.     -12:27pm Call received from [facility] with [facility], reports pt already on service with PT/OT, need order for resumption of services. Request sent to attending to enter [facility] PT/OT order.           Expected Discharge Plan and Services                                               Social Determinants of Health (SDOH) Interventions SDOH Screenings   Food Insecurity: No Food Insecurity (08/25/2022)  Housing: Low Risk  (08/25/2022)  Transportation Needs: No Transportation Needs (08/25/2022)  Utilities: Not At Risk (08/25/2022)  Financial Resource Strain: Low Risk  (10/26/2020)  Social Connections: Socially Isolated (10/26/2020)  Tobacco Use: Medium Risk (08/25/2022)    Readmission Risk Interventions    08/27/2022   12:09 PM 08/26/2022    9:53 AM 06/17/2022   12:54 PM  Readmission Risk Prevention Plan  Transportation Screening Complete  Complete  PCP or Specialist Appt within 5-7 Days   Complete  Home Care Screening   Complete  Medication Review (RN Care Manager) Complete Complete   PCP or Specialist appointment within 3-5 days of discharge Complete    HRI or Home Care Consult Complete Complete   SW Recovery Care/Counseling Consult Complete    Palliative Care Screening Not Applicable Not  Applicable   Skilled Nursing Facility Not Applicable Not Applicable

## 2022-08-27 NOTE — Progress Notes (Signed)
Notified MD that patient's current HR at the time of recheck was 115, but hanging around the 110's. MD told RN that if it remains elevated overnight, MD will more than likely have to increase the dose of coreg tomorrow. Will pass along to night shift RN.

## 2022-08-27 NOTE — Progress Notes (Signed)
Physical Therapy Treatment Patient Details Name: Desiree [name] MRN: [id number] DOB: [birth year] Today's Date: 08/27/2022   History of Present Illness 77 yo female whom presented to ED on 08/25/2022 due to progressive SOB, CXR revealed low lung volumes and slightly worsened bibasilar infiltrate.  Pt recently admitted to hospital 7/16-7/19/2024 due to generalized weakness and SOB and was found to have PNA. Pt d/c to ALF and concerns per continuation of ABX. Pt PMH includes but is not limited to:  emphysema, A -fib, CKD III,  PE, neuropathy, colon CA, lung CA, HTN, falls, COPD on 3 L/min supplemental O2 at baseline.    PT Comments   Pt admitted with above diagnosis.  Pt currently with functional limitations due to the deficits listed below (see PT Problem List). PT arrived and pt seated in recliner, daughter present. Daughter indicated that pt did not recall her being present t/o day 7/23 no PT intervention. Pt appeared to be confused and noted change in physical presentation with functional mobility tasks limited due to dyskinesia/ataxia and continued SOB. Pt 95% on 4 L/min at rest, stand 60s with RW and min A and cues for encouragement and anterior weight shift on 4 L/min 97% (115 PR), 3 L/min seated at rest and 95%, 3 L/min with gait 10 feet RW and min A O2 saturation 95% (113 PR) . Pt required increased time and therapeutic seated rest break between functional mobility tasks. At rest seated in recliner pt 93-95% on RA. PT and daughter provided coaching for pursed lip breathing t/o intervention and pt demonstrated difficulty following cues and perform return demonstration with pt electing to perform mouth breathing. Pt will require ongoing therapy services in next venue and reinforcement for breathing strategies. Pt left in recliner, all needs in place and daughter present. Pt will benefit from acute skilled PT to increase their independence and safety with mobility to allow discharge.       Assistance  Recommended at Discharge Intermittent Supervision/Assistance  If plan is discharge home, recommend the following:  Can travel by private vehicle    A little help with walking and/or transfers;Assistance with cooking/housework;A lot of help with bathing/dressing/bathroom;Direct supervision/assist for medications management;Assist for transportation;Help with stairs or ramp for entrance      Equipment Recommendations  None recommended by PT    Recommendations for Other Services       Precautions / Restrictions Precautions Precautions: Fall Restrictions Weight Bearing Restrictions: No     Mobility  Bed Mobility               General bed mobility comments: pt seated in recliner when PT arrived. pt and daughter indicate required A for bed mobility today    Transfers Overall transfer level: Needs assistance Equipment used: Rolling walker (2 wheels) Transfers: Sit to/from Stand Sit to Stand: Min assist           General transfer comment: cues for scooting anteriorly in recliner, push to stand and anterior weight shift with intital standing due to posterior lean pt limitig WB through anterior portion of B feet L>R and noted coordiation deficits today attibuted to apparent dyskinesia    Ambulation/Gait Ambulation/Gait assistance: Min assist Gait Distance (Feet): 10 Feet Assistive device: Rolling walker (2 wheels) Gait Pattern/deviations: Step-to pattern, Trunk flexed, Narrow base of support Gait velocity: decreased     General Gait Details: recliner close, amb assessed on  3 L/min with pt O2 saturation >/=95-97%%  PT noted change in gait presentation with dyskinesia/ataxia B LEs difficulty  with LE placement and advacement, pt required increased physical assist and cues. attempts for second amb bout and pt reported she just could not do it and that her legs were not doing what her brain was telling them   Stairs             Wheelchair Mobility     Tilt Bed     Modified Rankin (Stroke Patients Only)       Balance Overall balance assessment: Needs assistance, History of Falls Sitting-balance support: Feet supported Sitting balance-Leahy Scale: Fair     Standing balance support: Bilateral upper extremity supported, During functional activity, Reliant on assistive device for balance Standing balance-Leahy Scale: Poor Standing balance comment: posterior lean                            Cognition Arousal/Alertness: Awake/alert Behavior During Therapy: WFL for tasks assessed/performed Overall Cognitive Status: Within Functional Limits for tasks assessed                                          Exercises      General Comments        Pertinent Vitals/Pain      Home Living                          Prior Function            PT Goals (current goals can now be found in the care plan section) Acute Rehab PT Goals Patient Stated Goal: to get stronger and go home PT Goal Formulation: With patient Time For Goal Achievement: 09/16/22 Potential to Achieve Goals: Good Progress towards PT goals: Progressing toward goals    Frequency    Min 1X/week      PT Plan Current plan remains appropriate    Co-evaluation              AM-PAC PT "6 Clicks" Mobility   Outcome Measure  Help needed turning from your back to your side while in a flat bed without using bedrails?: A Little Help needed moving from lying on your back to sitting on the side of a flat bed without using bedrails?: A Little Help needed moving to and from a bed to a chair (including a wheelchair)?: A Little Help needed standing up from a chair using your arms (e.g., wheelchair or bedside chair)?: A Little Help needed to walk in hospital room?: A Little Help needed climbing 3-5 steps with a railing? : Total 6 Click Score: 16    End of Session Equipment Utilized During Treatment: Gait belt;Oxygen Activity Tolerance:  Patient limited by fatigue (SOB) Patient left: in chair;with call bell/phone within reach;with chair alarm set;with family/visitor present Nurse Communication: Mobility status PT Visit Diagnosis: Unsteadiness on feet (R26.81);Repeated falls (R29.6);Muscle weakness (generalized) (M62.81);Difficulty in walking, not elsewhere classified (R26.2)     Time: 1202-1229 PT Time Calculation (min) (ACUTE ONLY): 27 min  Charges:    $Gait Training: 8-22 mins $Therapeutic Activity: 8-22 mins PT General Charges $$ ACUTE PT VISIT: 1 Visit                     Johnny Bridge, PT Acute Rehab    Jacqualyn Posey 08/27/2022, 12:42 PM

## 2022-08-27 NOTE — Plan of Care (Signed)

## 2022-08-27 NOTE — Plan of Care (Signed)
  Problem: Clinical Measurements: Goal: Diagnostic test results will improve Outcome: Progressing Goal: Respiratory complications will improve Outcome: Progressing   Problem: Nutrition: Goal: Adequate nutrition will be maintained Outcome: Progressing   Problem: Pain Managment: Goal: General experience of comfort will improve Outcome: Progressing   Problem: Safety: Goal: Ability to remain free from injury will improve Outcome: Progressing   Problem: Skin Integrity: Goal: Risk for impaired skin integrity will decrease Outcome: Progressing

## 2022-08-27 NOTE — Progress Notes (Signed)
PROGRESS NOTE  Desiree [name]  DOB: 1945-06-01  PCP: Patient, No Pcp Per [id number]  DOA: 08/25/2022  LOS: 2 days  Hospital Day: 3  Brief narrative: Desiree [name] is a 77 y.o. 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  Per note, at the request of her ALF, antibiotics were sent to mail order pharmacy. Apparently, patient could not receive the antibiotics per days and hence started having worsening symptoms.  7/22, patient noted to have O2 sat low at 74% on 4 L.  EMS was sent and patient was sent back to ED. Rest virus panel unremarkable Chest x-ray showed worsening bilateral infiltrates started on IV antibiotics Admitted to [facility]  Subjective: Patient was seen and examined this morning.  Elderly Caucasian female.  Sitting up in recliner.  Not in distress.  On 3 to 4 L oxygen by nasal cannula.  Daughter at bedside. Chart reviewed Afebrile, tachycardic to 100s, blood pressure in 150s, on 3 L oxygen Most recent labs from 7/23 with hemoglobin low at 8.4, macrocytosis, procalcitonin less than 0.1  Assessment and plan: Incompletely treated CAP  Acute on chronic hypoxic respiratory failure Recently discharged on oral antibiotics for pneumonia but pharmacy could not fill the antibiotics and symptoms progressed leading to rehospitalization Currently on IV Rocephin and azithromycin At baseline on 3 L oxygen.  With EMS, O2 sat was in 70s on 4 L. Oxygenation improved, currently on 3 L  Acute exacerbation of COPD CTA chest 7/17 showed  emphysema and bronchomalacia with marked narrowing and near coaptation of the right bronchus intermedius and right mainstem bronchus On a tapering course of prednisone.  Currently on 40 mg daily.  Elevated troponin Mildly elevated troponin, downtrending.  Likely demand ischemia secondary to the stress of breathing. Recent Labs    08/25/22 1524 08/25/22 1850  TROPONINIHS 25* 19*   Squamous cell lung cancer (LLL and RLL nodule)  s/p recent radiation treatment just completed radiation treatment with rad onc; last seen 08/04/22 also follows with Dr. Tonia Brooms; no further bronch/biopsies recommended due to high risk under anesthesia  Rheumatoid arthritis on chronic prednisone 5 mg, leflunomide Plaquenil    Paroxysmal atrial fibrillation Currently on sinus rhythm on Coreg.   Not on anticoagulation due to history of bleeding and fall risk.      Chronic macrocytic anemia Chronic iron deficiency due to chronic blood loss GERD Hemoglobin at baseline close to 9.  Continue ferrous sulfate No active bleeding. Continue Protonix Recent Labs    08/20/22 0020 08/21/22 0757 08/22/22 0503 08/25/22 1524 08/26/22 0415  HGB 9.5* 10.2* 10.2* 9.1* 8.4*  MCV  --  100.0 100.9* 102.3* 102.2*   Type 2 diabetes mellitus without complications (HCC) A1c 4.9% on 08/20/22 diet control   Idiopathic peripheral neuropathy Restless legs Continue gabapentin, Requip, Remeron   Mild CAD/HLD Continue aspirin, statin, ARB   Hypertension Continue amlodipine, Coreg, irbesartan (for valsartan)  Impaired mobility Seen by PT.  To continue PT at ALF    Goals of care   Code Status: DNR    DVT prophylaxis:  Daughter  had concerns about drop in hemoglobin and wanted to pause heparin.   Antimicrobials: IV Rocephin, azithromycin Fluid: None Consultants: None Family Communication: Daughter at bedside  Status: Inpatient Level of care:  Telemetry   Patient from: ALF Anticipated d/c to: Back to ALF Needs to  continue in-hospital care:  Patient daughter do not feel comfortable for discharge today, mostly because she recently needed readmission.    Diet:  Diet Order             Diet regular Room service appropriate? Yes; Fluid consistency: Thin  Diet effective now                   Scheduled Meds:  amLODipine  5 mg Oral Daily   arformoterol  15 mcg Nebulization BID   aspirin EC  81 mg Oral Daily   atorvastatin  20 mg Oral QPM   budesonide (PULMICORT) nebulizer solution  0.25 mg Nebulization BID   carvedilol  3.125 mg Oral BID WC   feeding supplement  237 mL Oral BID BM   gabapentin  100 mg Oral BID   guaiFENesin  600 mg Oral BID   hydroxychloroquine  200 mg Oral Daily   irbesartan  300 mg Oral Daily   leflunomide  10 mg Oral Daily   montelukast  10 mg Oral QHS   pantoprazole  40 mg Oral Daily   predniSONE  40 mg Oral Q breakfast   sertraline  150 mg Oral Daily   sodium chloride flush  3 mL Intravenous Q12H    PRN meds: acetaminophen **OR** acetaminophen, ipratropium-albuterol, ondansetron **OR** ondansetron (ZOFRAN) IV, senna-docusate   Infusions:   azithromycin (ZITHROMAX) 500 mg in sodium chloride 0.9 % 250 mL IVPB 500 mg (08/26/22 2231)   cefTRIAXone (ROCEPHIN)  IV 1 g (08/27/22 0109)    Antimicrobials: Anti-infectives (From admission, onward)    Start     Dose/Rate Route Frequency Ordered Stop   08/26/22 1800  azithromycin (ZITHROMAX) 500 mg in sodium chloride 0.9 % 250 mL IVPB        500 mg 250 mL/hr over 60 Minutes Intravenous Every 24 hours 08/25/22 2005     08/26/22 1600  cefTRIAXone (ROCEPHIN) 1 g in sodium chloride 0.9 % 100 mL IVPB        1 g 200 mL/hr over 30 Minutes Intravenous Every 24 hours 08/25/22 2005     08/26/22 1000  hydroxychloroquine (PLAQUENIL) tablet 200 mg        200 mg Oral Daily 08/25/22 2054     08/25/22 1815  azithromycin (ZITHROMAX) 500 mg in sodium chloride 0.9 % 250 mL IVPB        500 mg 250 mL/hr over 60 Minutes Intravenous  Once  08/25/22 1808 08/25/22 2040   08/25/22 1630  cefTRIAXone (ROCEPHIN) 1 g in sodium chloride 0.9 % 100 mL IVPB        1 g 200 mL/hr over 30 Minutes Intravenous  Once 08/25/22 1627 08/25/22 1839       Nutritional status:  Body mass index is 19.68 kg/m.  Nutrition Problem: Moderate Malnutrition Etiology: chronic illness (squamous cell lung cancer) Signs/Symptoms: mild fat depletion, mild muscle depletion     Objective: Vitals:   08/27/22 0913 08/27/22 1232  BP: (!) 158/90 133/82  Pulse: (!) 110 (!) 105  Resp:  18  Temp:  97.6 F (36.4 C)  SpO2:  99%    Intake/Output Summary (Last 24 hours) at 08/27/2022 1656 Last data filed at 08/27/2022 1300  Gross per 24 hour  Intake 463 ml  Output 1100 ml  Net -637 ml   Filed Weights   08/25/22 1503 08/25/22 2046  Weight: 46.3 kg 45.7 kg   Weight change:  Body mass index is 19.68 kg/m.   Physical Exam: General exam: Pleasant, elderly Caucasian female.  Not in distress Skin: No rashes, lesions or ulcers. HEENT: Atraumatic, normocephalic, no obvious bleeding Lungs: Diminished entry both bases.  No wheezing CVS: Regular rate and rhythm, no murmur GI/Abd soft, nontender, nondistended, bowel sound present CNS: Alert, awake, oriented x 3 Psychiatry: Mood appropriate Extremities: No pedal edema, no calf tenderness  Data Review: I have personally reviewed the laboratory data and studies available.  F/u labs ordered Unresulted Labs (From admission, onward)     Start     Ordered   08/28/22 0500  CBC with Differential/Platelet  Tomorrow morning,   R        08/27/22 1656   08/28/22 0500  Basic metabolic panel  Tomorrow morning,   R        08/27/22 1656            Total time spent in review of labs and imaging, patient evaluation, formulation of plan, documentation and communication with family: 55 minutes  Signed, Lorin Glass, MD [facility] 08/27/2022

## 2022-08-27 NOTE — Progress Notes (Addendum)
   08/27/22 1736  Assess: MEWS Score  Temp 97.6 F (36.4 C)  BP 139/73  Pulse Rate (!) 118  ECG Heart Rate (!) 115  Resp 20  Level of Consciousness Alert  SpO2 97 %  O2 Device Nasal Cannula  O2 Flow Rate (L/min) 3 L/min  Assess: MEWS Score  MEWS Temp 0  MEWS Systolic 0  MEWS Pulse 2  MEWS RR 0  MEWS LOC 0  MEWS Score 2  MEWS Score Color Yellow  Assess: if the MEWS score is Yellow or Red  Were vital signs accurate and taken at a resting state? Yes  Does the patient meet 2 or more of the SIRS criteria? No  Does the patient have a confirmed or suspected source of infection? No  MEWS guidelines implemented  Yes, yellow  Treat  MEWS Interventions Considered administering scheduled or prn medications/treatments as ordered  Take Vital Signs  Increase Vital Sign Frequency  Yellow: Q2hr x1, continue Q4hrs until patient remains green for 12hrs  Escalate  MEWS: Escalate Yellow: Discuss with charge nurse and consider notifying provider and/or RRT  Notify: Charge Nurse/RN  Name of Charge Nurse/RN Notified Jiles Prows, RN  Provider Notification  Provider Name/Title Lorin Glass, MD  Date Provider Notified 08/27/22  Time Provider Notified 1748  Method of Notification Page  Notification Reason Other (Comment) (patient's increase in HR)  Provider response No new orders (none at this time)  Date of Provider Response 08/27/22  Time of Provider Response 1749  Assess: SIRS CRITERIA  SIRS Temperature  0  SIRS Pulse 1  SIRS Respirations  0  SIRS WBC 0  SIRS Score Sum  1   Will continue to monitor HR. If no improvement, will notify MD.

## 2022-08-28 ENCOUNTER — Inpatient Hospital Stay (HOSPITAL_COMMUNITY): Payer: Medicare PPO

## 2022-08-28 DIAGNOSIS — J9621 Acute and chronic respiratory failure with hypoxia: Secondary | ICD-10-CM | POA: Diagnosis not present

## 2022-08-28 DIAGNOSIS — R9431 Abnormal electrocardiogram [ECG] [EKG]: Secondary | ICD-10-CM

## 2022-08-28 LAB — CBC WITH DIFFERENTIAL/PLATELET
Abs Immature Granulocytes: 0.08 10*3/uL — ABNORMAL HIGH (ref 0.00–0.07)
Basophils Absolute: 0.1 10*3/uL (ref 0.0–0.1)
Basophils Relative: 1 %
Eosinophils Absolute: 0.6 10*3/uL — ABNORMAL HIGH (ref 0.0–0.5)
Eosinophils Relative: 6 %
HCT: 33 % — ABNORMAL LOW (ref 36.0–46.0)
Immature Granulocytes: 1 %
Lymphocytes Relative: 10 %
Lymphs Abs: 1 10*3/uL (ref 0.7–4.0)
MCHC: 31.2 g/dL (ref 30.0–36.0)
MCV: 99.4 fL (ref 80.0–100.0)
Monocytes Absolute: 1.3 10*3/uL — ABNORMAL HIGH (ref 0.1–1.0)
Monocytes Relative: 14 %
Platelets: 356 10*3/uL (ref 150–400)
RBC: 3.32 MIL/uL — ABNORMAL LOW (ref 3.87–5.11)
WBC: 10 10*3/uL (ref 4.0–10.5)
nRBC: 0 % (ref 0.0–0.2)

## 2022-08-28 LAB — BASIC METABOLIC PANEL
Anion gap: 9 (ref 5–15)
BUN: 22 mg/dL (ref 8–23)
CO2: 31 mmol/L (ref 22–32)
GFR, Estimated: >60 mL/min (ref >60)
Glucose, Bld: 109 mg/dL — ABNORMAL HIGH (ref 70–99)
Potassium: 4.4 mmol/L (ref 3.5–5.1)
Sodium: 139 mmol/L (ref 135–145)

## 2022-08-28 LAB — CULTURE, BLOOD (ROUTINE X 2)
Culture: NO GROWTH
Culture: NO GROWTH

## 2022-08-28 LAB — ECHOCARDIOGRAM COMPLETE
Height: 60 in
S' Lateral: 3.4 cm
Weight: 1612 oz

## 2022-08-28 MED ORDER — CARVEDILOL 6.25 MG PO TABS
6.2500 mg | ORAL_TABLET | Freq: Two times a day (BID) | ORAL | Status: DC
  Administered 2022-08-28 – 2022-08-29 (×3): 6.25 mg via ORAL
  Filled 2022-08-28 (×3): qty 1

## 2022-08-28 NOTE — Progress Notes (Signed)
PROGRESS NOTE  Desiree [name]  DOB: January 21, 1946  PCP: Patient, No Pcp Per [id number]  DOA: 08/25/2022  LOS: 3 days  Hospital Day: 4  Brief narrative: Desiree [name] is a 77 y.o. 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  Per note, at the request of her ALF, antibiotics were sent to mail order pharmacy. Apparently, patient could not receive the antibiotics per days and hence started having worsening symptoms.  7/22, patient noted to have O2 sat low at 74% on 4 L.  EMS was sent and patient was sent back to ED. Rest virus panel unremarkable Chest x-ray showed worsening bilateral infiltrates started on IV antibiotics Admitted to [facility]  Subjective: Patient was seen and examined this morning.  Propped up in bed.  Not in distress.  Breathing on 3 L oxygen.  Patient has been tachycardic in 110s since yesterday.  Coreg dose increased today.  Assessment and plan: Incompletely treated CAP  Acute on chronic hypoxic respiratory failure Recently discharged on oral antibiotics for pneumonia but pharmacy could not fill the antibiotics and symptoms progressed leading to rehospitalization Currently on IV Rocephin and azithromycin At baseline on 3 L oxygen.  With EMS, O2 sat was in 70s on 4 L. Oxygenation gradually improved, currently on 3 L  Acute exacerbation of COPD CTA chest 7/17 showed emphysema and bronchomalacia with marked narrowing and near coaptation of the right bronchus intermedius and right mainstem bronchus On a slow tapering course  of prednisone.  Currently on 40 mg daily.   Paroxysmal atrial fibrillation Currently on sinus tachycardia on Coreg.  Increase Coreg dose from 3.125 mg twice daily to 6.25 mg twice daily today. Not on anticoagulation due to history of bleeding and fall risk.    Elevated troponin Sinus tachycardia Mildly elevated troponin, downtrending.  Likely demand ischemia secondary to the stress of breathing. With sinus tachycardia today, will obtain echocardiogram. Recent Labs    08/25/22 1524 08/25/22 1850  TROPONINIHS 25* 19*   Squamous cell lung cancer (LLL and RLL nodule)  s/p recent radiation treatment just completed radiation treatment with rad onc; last seen 08/04/22 also follows with Dr. Tonia Brooms; no further bronch/biopsies recommended due to high risk under anesthesia  Rheumatoid arthritis on chronic prednisone 5 mg, leflunomide, Plaquenil     Chronic macrocytic anemia Chronic iron deficiency due to chronic blood loss GERD Hemoglobin at baseline close to 9.  Continue ferrous sulfate No active bleeding. Continue Protonix Recent Labs    08/21/22 0757 08/22/22 0503 08/25/22 1524 08/26/22 0415 08/28/22 0353  HGB 10.2* 10.2* 9.1* 8.4* 10.3*  MCV 100.0 100.9* 102.3* 102.2* 99.4   Type 2 diabetes mellitus without complications (HCC) A1c 4.9% on 08/20/22 diet control   Idiopathic peripheral neuropathy Restless legs Continue gabapentin, Requip, Remeron   Mild CAD/HLD Continue aspirin, statin, ARB   Hypertension Continue amlodipine, Coreg, irbesartan (for valsartan)  Impaired mobility Seen by PT.  To continue PT at ALF    Goals of care   Code Status: DNR    DVT prophylaxis:  Daughter had concerns about drop in  hemoglobin and wanted to pause heparin.   Antimicrobials: IV Rocephin, azithromycin Fluid: None Consultants: None Family Communication: Daughter at bedside  Status: Inpatient Level of care:  Telemetry   Patient from: ALF Anticipated d/c to: Back to  ALF Needs to continue in-hospital care:  Patient daughter do not feel comfortable for discharge today, mostly because she recently needed readmission.    Diet:  Diet Order             Diet regular Room service appropriate? Yes; Fluid consistency: Thin  Diet effective now                   Scheduled Meds:  amLODipine  5 mg Oral Daily   arformoterol  15 mcg Nebulization BID   aspirin EC  81 mg Oral Daily   atorvastatin  20 mg Oral QPM   budesonide (PULMICORT) nebulizer solution  0.25 mg Nebulization BID   carvedilol  6.25 mg Oral BID WC   feeding supplement  237 mL Oral BID BM   gabapentin  100 mg Oral BID   guaiFENesin  600 mg Oral BID   hydroxychloroquine  200 mg Oral Daily   irbesartan  300 mg Oral Daily   leflunomide  10 mg Oral Daily   montelukast  10 mg Oral QHS   pantoprazole  40 mg Oral Daily   predniSONE  40 mg Oral Q breakfast   sertraline  150 mg Oral Daily   sodium chloride flush  3 mL Intravenous Q12H    PRN meds: acetaminophen **OR** acetaminophen, ipratropium-albuterol, ondansetron **OR** ondansetron (ZOFRAN) IV, senna-docusate   Infusions:   azithromycin (ZITHROMAX) 500 mg in sodium chloride 0.9 % 250 mL IVPB 500 mg (08/27/22 2258)   cefTRIAXone (ROCEPHIN)  IV 1 g (08/27/22 2141)    Antimicrobials: Anti-infectives (From admission, onward)    Start     Dose/Rate Route Frequency Ordered Stop   08/26/22 1800  azithromycin (ZITHROMAX) 500 mg in sodium chloride 0.9 % 250 mL IVPB        500 mg 250 mL/hr over 60 Minutes Intravenous Every 24 hours 08/25/22 2005     08/26/22 1600  cefTRIAXone (ROCEPHIN) 1 g in sodium chloride 0.9 % 100 mL IVPB        1 g 200 mL/hr over 30 Minutes Intravenous Every 24 hours 08/25/22 2005     08/26/22 1000  hydroxychloroquine (PLAQUENIL) tablet 200 mg        200 mg Oral Daily 08/25/22 2054     08/25/22 1815  azithromycin (ZITHROMAX) 500 mg in sodium chloride 0.9 % 250 mL IVPB        500 mg 250 mL/hr over 60 Minutes  Intravenous  Once 08/25/22 1808 08/25/22 2040   08/25/22 1630  cefTRIAXone (ROCEPHIN) 1 g in sodium chloride 0.9 % 100 mL IVPB        1 g 200 mL/hr over 30 Minutes Intravenous  Once 08/25/22 1627 08/25/22 1839       Nutritional status:  Body mass index is 19.68 kg/m.  Nutrition Problem: Moderate Malnutrition Etiology: chronic illness (squamous cell lung cancer) Signs/Symptoms: mild fat depletion, mild muscle depletion     Objective: Vitals:   08/28/22 0818 08/28/22 0838  BP: (!) 165/81   Pulse: (!) 108   Resp: 18   Temp: 97.9 F (36.6 C)   SpO2: 98% 93%    Intake/Output Summary (Last 24 hours) at 08/28/2022 1115 Last data filed at 08/28/2022 0500 Gross per 24 hour  Intake  570 ml  Output --  Net 570 ml   Filed Weights   08/25/22 1503 08/25/22 2046  Weight: 46.3 kg 45.7 kg   Weight change:  Body mass index is 19.68 kg/m.   Physical Exam: General exam: Pleasant, elderly Caucasian female.  Not in distress Skin: No rashes, lesions or ulcers. HEENT: Atraumatic, normocephalic, no obvious bleeding Lungs: Diminished entry both bases.  No wheezing CVS: Tachycardic, regular rhythm  GI/Abd soft, nontender, nondistended, bowel sound present CNS: Alert, awake, oriented x 3 Psychiatry: Mood appropriate Extremities: No pedal edema, no calf tenderness  Data Review: I have personally reviewed the laboratory data and studies available.  F/u labs ordered Unresulted Labs (From admission, onward)    None       Total time spent in review of labs and imaging, patient evaluation, formulation of plan, documentation and communication with family: 45 minutes  Signed, Lorin Glass, MD [facility] 08/28/2022

## 2022-08-28 NOTE — Progress Notes (Signed)
   08/28/22 0305  Assess: MEWS Score  Temp 98.4 F (36.9 C)  BP (!) 158/88  MAP (mmHg) 107  Pulse Rate (!) 114  Resp 20  SpO2 99 %  O2 Device Nasal Cannula  Assess: MEWS Score  MEWS Temp 0  MEWS Systolic 0  MEWS Pulse 2  MEWS RR 0  MEWS LOC 0  MEWS Score 2  MEWS Score Color Yellow  Assess: if the MEWS score is Yellow or Red  Were vital signs accurate and taken at a resting state? Yes  Does the patient meet 2 or more of the SIRS criteria? No  Does the patient have a confirmed or suspected source of infection? No  MEWS guidelines implemented  No, previously yellow, continue vital signs every 4 hours  Notify: Charge Nurse/RN  Name of Charge Nurse/RN Notified Lauren, RN  Assess: SIRS CRITERIA  SIRS Temperature  0  SIRS Pulse 1  SIRS Respirations  0  SIRS WBC 0  SIRS Score Sum  1

## 2022-08-28 NOTE — Progress Notes (Signed)
  Echocardiogram 2D Echocardiogram has been performed.  Leda Roys RDCS 08/28/2022, 2:04 PM

## 2022-08-29 ENCOUNTER — Other Ambulatory Visit (HOSPITAL_COMMUNITY): Payer: Self-pay

## 2022-08-29 DIAGNOSIS — J9621 Acute and chronic respiratory failure with hypoxia: Secondary | ICD-10-CM | POA: Diagnosis not present

## 2022-08-29 MED ORDER — SACCHAROMYCES BOULARDII 250 MG PO CAPS: 250.0000 mg | ORAL_CAPSULE | Freq: Two times a day (BID) | ORAL | 0 refills | Status: AC

## 2022-08-29 MED ORDER — SACCHAROMYCES BOULARDII 250 MG PO CAPS
250.0000 mg | ORAL_CAPSULE | Freq: Two times a day (BID) | ORAL | 0 refills | Status: DC
  Filled 2022-08-29: qty 20, 10d supply, fill #0

## 2022-08-29 MED ORDER — CARVEDILOL 6.25 MG PO TABS
6.2500 mg | ORAL_TABLET | Freq: Two times a day (BID) | ORAL | 0 refills | Status: DC
  Filled 2022-08-29: qty 60, 30d supply, fill #0

## 2022-08-29 MED ORDER — GUAIFENESIN ER 600 MG PO TB12
600.0000 mg | ORAL_TABLET | Freq: Two times a day (BID) | ORAL | 0 refills | Status: DC
  Filled 2022-08-29: qty 28, 14d supply, fill #0

## 2022-08-29 MED ORDER — CEFDINIR 300 MG PO CAPS
300.0000 mg | ORAL_CAPSULE | Freq: Two times a day (BID) | ORAL | 0 refills | Status: AC
  Filled 2022-08-29: qty 10, 5d supply, fill #0

## 2022-08-29 MED ORDER — PREDNISONE 10 MG PO TABS
ORAL_TABLET | ORAL | 0 refills | Status: DC
  Filled 2022-08-29: qty 30, 12d supply, fill #0

## 2022-08-29 MED ORDER — SENNOSIDES-DOCUSATE SODIUM 8.6-50 MG PO TABS
1.0000 | ORAL_TABLET | Freq: Every day | ORAL | 0 refills | Status: DC
  Filled 2022-08-29: qty 30, 30d supply, fill #0

## 2022-08-29 MED ORDER — GUAIFENESIN ER 600 MG PO TB12: 600.0000 mg | ORAL_TABLET | Freq: Two times a day (BID) | ORAL | 0 refills | Status: AC

## 2022-08-29 MED ORDER — PREDNISONE 5 MG PO TABS: 5.0000 mg | ORAL_TABLET | Freq: Every day | ORAL | Status: DC

## 2022-08-29 NOTE — [place]2 (Signed)
[place] MEDICAID [id number] LEVEL OF CARE FORM     IDENTIFICATION  Patient Name: Desiree [name] Birthdate: Jun 10, 1945 Sex: female Admission Date (Current Location): 08/25/2022  [facility] and [place] Number:  Producer, television/film/video and Address:  [facility],  [address], [place] [address]      Provider Number: [id number]  Attending Physician Name and Address:  Lorin Glass, MD  Relative Name and Phone Number:  Neysa Bonito Collantes (dtr) [phone]    Current Level of Care: Hospital Recommended Level of Care: Assisted Living Facility (resides at [facility]) Prior Approval Number:    Date Approved/Denied:   PASRR Number:    Discharge Plan: Home ([facility])    Current Diagnoses: Patient Active Problem List   Diagnosis Date Noted   Malnutrition of moderate degree 08/26/2022   Acute on chronic hypoxic respiratory failure (HCC) 08/25/2022   CAP (community acquired pneumonia) 08/20/2022   Hypomagnesemia 08/20/2022   UTI (urinary tract infection) 06/15/2022   AF (paroxysmal atrial fibrillation) (HCC) 10/10/2021   History of radiation therapy 04/09/2021   Chronic pain 04/09/2021   Hypothyroidism 04/09/2021   Type 2 diabetes mellitus without complications (HCC) 04/09/2021   Vitamin D deficiency 04/09/2021   Malignant tumor of ascending colon (HCC) 04/09/2021   Chronic obstructive pulmonary disease (HCC) 04/09/2021   Malignant neoplasm of unspecified part of left bronchus or lung (HCC) 04/09/2021   Atherosclerotic heart disease of native coronary artery without angina pectoris 04/09/2021   Chronic anemia 04/09/2021   Other vitamin B12 deficiency anemias 04/09/2021   Chronic atrial fibrillation, unspecified (HCC) 10/04/2020   Carcinoma of ascending colon (HCC) 10/04/2020   Dyspnea 10/04/2020   Lung cancer, lower lobe (HCC) 09/24/2020   Lung nodule, solitary 08/16/2020   Status post thoracentesis    Schatzki's ring    Cancer of ascending colon (HCC)     Gastrointestinal hemorrhage    Cat allergies 07/26/2020   Symptomatic anemia 07/25/2020   Idiopathic peripheral neuropathy 02/23/2020   Benign paroxysmal positional vertigo due to bilateral vestibular disorder 02/09/2020   Cysts of both ovaries 11/24/2019   Other cirrhosis of liver (HCC) 11/24/2019   Early satiety 04/14/2019   Paroxysmal atrial fibrillation (HCC) 04/14/2019   Mild CAD 02/02/2019   Abnormal nuclear stress test    Iron deficiency anemia due to chronic blood loss 10/27/2018   Alcoholism (HCC) 10/21/2018   CKD (chronic kidney disease) stage 3, GFR 30-59 ml/min (HCC) 01/04/2018   Senile purpura (HCC) 01/04/2018   At high risk for falls 07/14/2017   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HCC) 07/14/2017   Gastro-esophageal reflux disease without esophagitis 07/14/2017   History of pulmonary embolus (PE) 07/14/2017   Malaise and fatigue 07/14/2017   Mild episode of recurrent major depressive disorder (HCC) 07/14/2017   Mixed hyperlipidemia 07/14/2017   Prediabetes 07/14/2017   Skin rash 07/14/2017   Vitamin B12 deficiency 07/14/2017   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   Multiple lung nodules on CT 05/16/2016   Osteoporosis 05/16/2016   Centrilobular emphysema (HCC) 05/16/2016   Age-related osteoporosis without current pathological fracture 05/16/2016   Allergic rhinitis 07/17/2015   Pulmonary embolism (HCC) 11/23/2014   Rheumatoid arthritis(714.0) 09/20/2010   Rheumatoid arthritis (HCC) 09/20/2010   COPD (chronic obstructive pulmonary disease) (HCC) 08/22/2010   Hypertension 08/22/2010   Essential hypertension 08/22/2010    Orientation RESPIRATION BLADDER Height & Weight     Self, Situation, Place  O2 Incontinent Weight: 45.7 kg Height:  5' (152.4  cm)  BEHAVIORAL SYMPTOMS/MOOD NEUROLOGICAL BOWEL NUTRITION STATUS      Incontinent Diet (regular diet)  AMBULATORY STATUS COMMUNICATION OF NEEDS Skin   Limited Assist Verbally PU Stage and Appropriate  Care (Bialteral buttocks) PU Stage 1 Dressing: No Dressing                     Personal Care Assistance Level of Assistance  Bathing, Feeding, Dressing Bathing Assistance: Limited assistance Feeding assistance: Limited assistance Dressing Assistance: Limited assistance     Functional Limitations Info  Sight, Hearing, Speech Sight Info: Impaired (eyeglasses) Hearing Info: Adequate Speech Info: Adequate    SPECIAL CARE FACTORS FREQUENCY                       Contractures Contractures Info: Not present    Additional Factors Info  Code Status, Allergies, Psychotropic Code Status Info: DNR Allergies Info: Other, Oxycodone Psychotropic Info: N/A         Current Medications (08/29/2022):  This is the current hospital active medication list Current Facility-Administered Medications  Medication Dose Route Frequency Provider Last Rate Last Admin   acetaminophen (TYLENOL) tablet 650 mg  650 mg Oral Q6H PRN Charlsie Quest, MD   650 mg at 08/26/22 2210   Or   acetaminophen (TYLENOL) suppository 650 mg  650 mg Rectal Q6H PRN Charlsie Quest, MD       amLODipine (NORVASC) tablet 5 mg  5 mg Oral Daily Darreld Mclean R, MD   5 mg at 08/29/22 0942   arformoterol (BROVANA) nebulizer solution 15 mcg  15 mcg Nebulization BID Charlsie Quest, MD   15 mcg at 08/29/22 0902   aspirin EC tablet 81 mg  81 mg Oral Daily Darreld Mclean R, MD   81 mg at 08/29/22 0943   atorvastatin (LIPITOR) tablet 20 mg  20 mg Oral QPM Darreld Mclean R, MD   20 mg at 08/28/22 1847   azithromycin (ZITHROMAX) 500 mg in sodium chloride 0.9 % 250 mL IVPB  500 mg Intravenous Q24H Darreld Mclean R, MD 250 mL/hr at 08/28/22 2138 500 mg at 08/28/22 2138   budesonide (PULMICORT) nebulizer solution 0.25 mg  0.25 mg Nebulization BID Darreld Mclean R, MD   0.25 mg at 08/29/22 5784   carvedilol (COREG) tablet 6.25 mg  6.25 mg Oral BID WC Dahal, Melina Schools, MD   6.25 mg at 08/29/22 0747   cefTRIAXone (ROCEPHIN) 1 g in sodium  chloride 0.9 % 100 mL IVPB  1 g Intravenous Q24H Darreld Mclean R, MD 200 mL/hr at 08/28/22 2042 1 g at 08/28/22 2042   feeding supplement (ENSURE ENLIVE / ENSURE PLUS) liquid 237 mL  237 mL Oral BID BM Darreld Mclean R, MD   237 mL at 08/29/22 1000   gabapentin (NEURONTIN) capsule 100 mg  100 mg Oral BID Darreld Mclean R, MD   100 mg at 08/29/22 0941   guaiFENesin (MUCINEX) 12 hr tablet 600 mg  600 mg Oral BID Darreld Mclean R, MD   600 mg at 08/29/22 0941   hydroxychloroquine (PLAQUENIL) tablet 200 mg  200 mg Oral Daily Darreld Mclean R, MD   200 mg at 08/29/22 0942   ipratropium-albuterol (DUONEB) 0.5-2.5 (3) MG/3ML nebulizer solution 3 mL  3 mL Nebulization Q6H PRN Darreld Mclean R, MD   3 mL at 08/28/22 0844   irbesartan (AVAPRO) tablet 300 mg  300 mg Oral Daily Darreld Mclean R, MD   300 mg at 08/29/22  8657   leflunomide (ARAVA) tablet 10 mg  10 mg Oral Daily Darreld Mclean R, MD   10 mg at 08/29/22 0941   montelukast (SINGULAIR) tablet 10 mg  10 mg Oral QHS Darreld Mclean R, MD   10 mg at 08/28/22 2043   ondansetron (ZOFRAN) tablet 4 mg  4 mg Oral Q6H PRN Charlsie Quest, MD       Or   ondansetron (ZOFRAN) injection 4 mg  4 mg Intravenous Q6H PRN Charlsie Quest, MD       pantoprazole (PROTONIX) EC tablet 40 mg  40 mg Oral Daily Darreld Mclean R, MD   40 mg at 08/29/22 0942   predniSONE (DELTASONE) tablet 40 mg  40 mg Oral Q breakfast Darreld Mclean R, MD   40 mg at 08/29/22 0746   senna-docusate (Senokot-S) tablet 1 tablet  1 tablet Oral QHS PRN Charlsie Quest, MD       sertraline (ZOLOFT) tablet 150 mg  150 mg Oral Daily Darreld Mclean R, MD   150 mg at 08/29/22 0942   sodium chloride flush (NS) 0.9 % injection 3 mL  3 mL Intravenous Q12H Charlsie Quest, MD   3 mL at 08/29/22 1004     Discharge Medications: Please see discharge summary for a list of discharge medications.  Relevant Imaging Results:  Relevant Lab Results:   Additional Information SSN: [id number]  Howell Rucks,  RN

## 2022-08-29 NOTE — Care Management Important Message (Signed)
Important Message  Patient Details IM Letter given. Name: Summerlin Dimery MRN: [id number] Date of Birth: 06-Mar-1945   Medicare Important Message Given:  Yes     Caren Macadam 08/29/2022, 12:19 PM

## 2022-08-29 NOTE — Progress Notes (Signed)
Notified [facility] of discharge instructions. Discussed with RN, Geoffery Spruce about medication changes within instructions. New medications (antibiotic, stool softener, carvedilol, and predisone taper). [facility] arrived to pick up patient. IV removed prior to. Discharge instructions also discussed with daughter, Cerina Moy.

## 2022-08-29 NOTE — Progress Notes (Signed)
Physical Therapy Treatment Patient Details Name: Desiree [name] MRN: [id number] DOB: 12-Apr-1945 Today's Date: 08/29/2022   History of Present Illness 77 yo female whom presented to ED on 08/25/2022 due to progressive SOB, CXR revealed low lung volumes and slightly worsened bibasilar infiltrate.  Pt recently admitted to hospital 7/16-7/19/2024 due to generalized weakness and SOB and was found to have PNA. Pt d/c to ALF and concerns per continuation of ABX. Pt PMH includes but is not limited to:  emphysema, A -fib, CKD III,  PE, neuropathy, colon CA, lung CA, HTN, falls, COPD on 3 L/min supplemental O2 at baseline.    PT Comments  Pt just finished with wash up with nursing, agreeable to therapy. Pt needing verbal cues for pursed lip breathing and relaxation cues to improve fear of falling reports. Pt with BLE shakiness in static standing, reports "jelly legs" at baseline, improves with standing. Pt ambulates 6 ft x2, short steps, decreased cadence, narrow BOS with BLE brushing against one another, min A to steady +2 for line management and pt comfort. Pt completes multiple STS reps, verbal cues for hand placement and BLE engagement, pt prefers to pull from braced RW to power up. Pt fatigues easily, once in chair begins to fall asleep; daughter at bedside and all needs in reach.    Assistance Recommended at Discharge Intermittent Supervision/Assistance  If plan is discharge home, recommend the following:  Can travel by private vehicle    A little help with walking and/or transfers;Assistance with cooking/housework;Direct supervision/assist for medications management;Assist for transportation;Help with stairs or ramp for entrance;A little help with bathing/dressing/bathroom      Equipment Recommendations  None recommended by PT    Recommendations for Other Services       Precautions / Restrictions Precautions Precautions: Fall Restrictions Weight Bearing Restrictions: No     Mobility   Bed Mobility               General bed mobility comments: seated EOB upon arrival    Transfers Overall transfer level: Needs assistance Equipment used: Rolling walker (2 wheels) Transfers: Sit to/from Stand Sit to Stand: Min assist           General transfer comment: min A for STS from EOB and recliner, verbal cues for hand placement but pt prefers to pull from braced RW to power up, narrow BOS And BLE braced against seated surface    Ambulation/Gait Ambulation/Gait assistance: Min assist, +2 safety/equipment Gait Distance (Feet): 6 Feet (x2) Assistive device: Rolling walker (2 wheels) Gait Pattern/deviations: Step-to pattern, Trunk flexed, Narrow base of support Gait velocity: decreased     General Gait Details: step to gait pattern with decreased BOS and feet occasionaly brushing against one another, BLE shakiness in standing that improves with time in weightbearing, pt verbalizes fear of falling and "jelly legs" that come and go at baseline, therapist managing O2 line   Stairs             Wheelchair Mobility     Tilt Bed    Modified Rankin (Stroke Patients Only)       Balance Overall balance assessment: Needs assistance, History of Falls Sitting-balance support: Feet supported Sitting balance-Leahy Scale: Fair     Standing balance support: Bilateral upper extremity supported, During functional activity, Reliant on assistive device for balance Standing balance-Leahy Scale: Poor  Cognition Arousal/Alertness: Awake/alert Behavior During Therapy: WFL for tasks assessed/performed Overall Cognitive Status: Within Functional Limits for tasks assessed                                          Exercises      General Comments        Pertinent Vitals/Pain Pain Assessment Pain Assessment: No/denies pain    Home Living                          Prior Function            PT  Goals (current goals can now be found in the care plan section) Acute Rehab PT Goals Patient Stated Goal: to get stronger and go home PT Goal Formulation: With patient Time For Goal Achievement: 09/16/22 Potential to Achieve Goals: Good Progress towards PT goals: Progressing toward goals    Frequency    Min 1X/week      PT Plan Current plan remains appropriate    Co-evaluation              AM-PAC PT "6 Clicks" Mobility   Outcome Measure  Help needed turning from your back to your side while in a flat bed without using bedrails?: A Little Help needed moving from lying on your back to sitting on the side of a flat bed without using bedrails?: A Little Help needed moving to and from a bed to a chair (including a wheelchair)?: A Little Help needed standing up from a chair using your arms (e.g., wheelchair or bedside chair)?: A Little Help needed to walk in hospital room?: A Little Help needed climbing 3-5 steps with a railing? : Total 6 Click Score: 16    End of Session Equipment Utilized During Treatment: Gait belt;Oxygen Activity Tolerance: Patient limited by fatigue Patient left: in chair;with call bell/phone within reach;with chair alarm set;with family/visitor present Nurse Communication: Mobility status PT Visit Diagnosis: Unsteadiness on feet (R26.81);Repeated falls (R29.6);Muscle weakness (generalized) (M62.81);Difficulty in walking, not elsewhere classified (R26.2)     Time: 5784-6962 PT Time Calculation (min) (ACUTE ONLY): 18 min  Charges:    $Therapeutic Activity: 8-22 mins PT General Charges $$ ACUTE PT VISIT: 1 Visit                     Tori Wyndham Santilli PT, DPT 08/29/22, 11:01 AM

## 2022-08-29 NOTE — Discharge Summary (Signed)
Physician Discharge Summary  Brittanie Saley QIO:[id number] DOB: [birth year] DOA: 08/25/2022  PCP: Patient, No Pcp Per  Admit date: 08/25/2022 Discharge date: 08/29/2022  Admitted From: [facility] facility Discharge disposition: Back to ALF  Recommendations at discharge:  Completed course of antibiotics as recommended You been started on slow tapering course of prednisone after completion of which you should resume prednisone 5 mg daily as before.   Brief narrative: Desiree [name] is a 77 y.o. 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  Per note, at the request of her ALF, antibiotics were sent to mail order pharmacy. Apparently, patient could not receive the antibiotics per days and hence started having worsening symptoms.  7/22, patient noted to have O2 sat low at 74% on 4 L.  EMS was sent and patient was sent back to ED. Rest virus panel unremarkable Chest x-ray showed worsening bilateral infiltrates started on IV antibiotics Admitted to [facility]  Subjective: Patient was seen and examined this morning.  Propped up in bed.  Not in distress.  Breathing on 3 L oxygen.  Tachycardia improving after Coreg dose was increased. Daughter at bedside.  Patient and daughter are both agreeable for discharge today.  Assessment and plan: Incompletely treated CAP  Acute on chronic hypoxic respiratory failure Recently discharged on oral antibiotics for pneumonia but pharmacy could not fill the antibiotics and symptoms progressed leading  to rehospitalization Currently on IV Rocephin and azithromycin.  Completed a course of azithromycin.  Will discharge on 5 more days of oral Omnicef with probiotics. At baseline on 3 L oxygen.  With EMS, O2 sat was in 70s on 4 L. Oxygenation gradually improved, currently on 3 L  Acute exacerbation of COPD CTA chest 7/17 showed emphysema and bronchomalacia with marked narrowing and near coaptation of the right bronchus intermedius and right mainstem bronchus Currently on prednisone 40 mg daily.  Discharge on a slow tapering course of prednisone.   Paroxysmal atrial fibrillation While in the hospital, patient had persistent sinus tachycardia which is gradually improving after Coreg dose was increased from 3.125 mg twice daily to 6.25 mg twice daily today. Not on anticoagulation due to history of bleeding and fall risk.    Elevated troponin Sinus tachycardia Mildly elevated troponin, downtrending.  Likely demand ischemia secondary to the stress of breathing. Echocardiogram without WMA  Squamous cell lung cancer (LLL and RLL nodule)  s/p recent radiation treatment just completed radiation treatment with rad onc; last seen 08/04/22 also follows with Dr. Tonia Brooms; no further bronch/biopsies recommended due to high risk under anesthesia  Rheumatoid arthritis on chronic prednisone 5 mg, leflunomide, Plaquenil     Chronic macrocytic anemia Chronic iron deficiency due to chronic blood loss GERD Hemoglobin at baseline close to 9.  Continue ferrous sulfate No active bleeding. Continue Protonix Recent Labs    08/21/22 0757 08/22/22 0503 08/25/22 1524 08/26/22 0415 08/28/22 0353  HGB 10.2* 10.2* 9.1* 8.4* 10.3*  MCV 100.0 100.9* 102.3* 102.2* 99.4   Type 2 diabetes mellitus without complications (HCC) A1c 4.9% on 08/20/22 diet control  Idiopathic peripheral neuropathy Restless legs Continue gabapentin, Requip, Remeron   Mild CAD/HLD Continue aspirin, statin, ARB    Hypertension Continue amlodipine, Coreg, irbesartan (for valsartan)  Impaired mobility Seen by PT.  To continue PT at ALF    Goals of care   Code Status: DNR   Wounds:  - Pressure Injury 08/27/22 Buttocks Bilateral Stage 1 -  Intact skin with non-blanchable redness of a localized area usually over a bony prominence. (Active)  Date First Assessed/Time First Assessed: 08/27/22 0920   Location: Buttocks  Location Orientation: Bilateral  Staging: Stage 1 -  Intact skin with non-blanchable redness of a localized area usually over a bony prominence.  Present on Admission: Yes    Assessments 08/27/2022  9:20 AM 08/28/2022  8:00 PM  Dressing Type Foam - Lift dressing to assess site every shift Foam - Lift dressing to assess site every shift  Dressing Intact --  Dressing Change Frequency PRN --  State of Healing Other (Comment) --  Site / Wound Assessment Red;Pink Dressing in place / Unable to assess  Peri-wound Assessment Pink;Erythema (non-blanchable) --  Margins Other (Comment) --  Drainage Amount None --  Treatment Cleansed;Other (Comment);Off loading --     No associated orders.    Discharge Exam:   Vitals:   08/28/22 1541 08/28/22 1934 08/28/22 1950 08/29/22 0516  BP: 105/76  (!) 149/75 (!) 152/83  Pulse: 99  (!) 107 (!) 106  Resp: 17  20   Temp: 98.1 F (36.7 C)  98.5 F (36.9 C) 97.8 F (36.6 C)  TempSrc:   Oral Oral  SpO2: 96% 97% 98% 97%  Weight:      Height:        Body mass index is 19.68 kg/m.   General exam: Pleasant, elderly Caucasian female.  Not in distress Skin: No rashes, lesions or ulcers. HEENT: Atraumatic, normocephalic, no obvious bleeding Lungs: Diminished entry both bases.  No crackles.  No wheezing CVS: Tachycardic, regular rhythm  GI/Abd soft, nontender, nondistended, bowel sound present CNS: Alert, awake, oriented x 3 Psychiatry: Mood appropriate Extremities: No pedal edema, no calf tenderness  Follow ups:    Follow-up Information      Connect with your PCP/Specialist as discussed. Schedule an appointment as soon as possible for a visit .   Contact information: [email or web address] Call our physician referral line at [phone].        [facility] Follow up.   Specialty: Home Health Services Why: Home Health Physical Therapy Contact information: [address] [place] [address] [phone]                 Discharge Instructions:   Discharge Instructions     Call MD for:  difficulty breathing, headache or visual disturbances   Complete by: As directed    Call MD for:  extreme fatigue   Complete by: As directed    Call MD for:  hives   Complete by: As directed    Call MD for:  persistant dizziness or light-headedness   Complete by: As directed    Call MD for:  persistant nausea and vomiting   Complete by: As directed    Call MD for:  severe uncontrolled pain   Complete by: As directed    Call MD for:  temperature >100.4   Complete by: As directed    Diet general   Complete by: As directed    Discharge instructions   Complete by: As directed    Recommendations  at discharge:   Completed course of antibiotics as recommended  You been started on slow tapering course of prednisone after completion of which you should resume prednisone 5 mg daily as before.  General discharge instructions: Follow with Primary MD Patient, No Pcp Per in 7 days  Please request your PCP  to go over your hospital tests, procedures, radiology results at the follow up. Please get your medicines reviewed and adjusted.  Your PCP may decide to repeat certain labs or tests as needed. Do not drive, operate heavy machinery, perform activities at heights, swimming or participation in water activities or provide baby sitting services if your were admitted for syncope or siezures until you have seen by Primary MD or a Neurologist and advised to do so again. [place] Controlled  Substance Reporting System database was reviewed. Do not drive, operate heavy machinery, perform activities at heights, swim, participate in water activities or provide baby-sitting services while on medications for pain, sleep and mood until your outpatient physician has reevaluated you and advised to do so again.  You are strongly recommended to comply with the dose, frequency and duration of prescribed medications. Activity: As tolerated with Full fall precautions use walker/cane & assistance as needed Avoid using any recreational substances like cigarette, tobacco, alcohol, or non-prescribed drug. If you experience worsening of your admission symptoms, develop shortness of breath, life threatening emergency, suicidal or homicidal thoughts you must seek medical attention immediately by calling 911 or calling your MD immediately  if symptoms less severe. You must read complete instructions/literature along with all the possible adverse reactions/side effects for all the medicines you take and that have been prescribed to you. Take any new medicine only after you have completely understood and accepted all the possible adverse reactions/side effects.  Wear Seat belts while driving. You were cared for by a hospitalist during your hospital stay. If you have any questions about your discharge medications or the care you received while you were in the hospital after you are discharged, you can call the unit and ask to speak with the hospitalist or the covering physician. Once you are discharged, your primary care physician will handle any further medical issues. Please note that NO REFILLS for any discharge medications will be authorized once you are discharged, as it is imperative that you return to your primary care physician (or establish a relationship with a primary care physician if you do not have one).   Discharge wound care:   Complete by: As directed    Increase activity slowly   Complete by: As  directed        Discharge Medications:   Allergies as of 08/29/2022       Reactions   Other Shortness Of Breath   Cats   Oxycodone Other (See Comments)   hallucinations        Medication List     STOP taking these medications    amoxicillin 500 MG capsule Commonly known as: AMOXIL   amoxicillin 875 MG tablet Commonly known as: AMOXIL   doxycycline 100 MG tablet Commonly known as: VIBRA-TABS       TAKE these medications    acetaminophen 325 MG tablet Commonly known as: TYLENOL Take 325 mg by mouth every 6 (six) hours as needed for moderate pain or headache.   albuterol 108 (90 Base) MCG/ACT inhaler Commonly known as: VENTOLIN HFA Inhale 2 puffs into the lungs every 4 (four) hours as needed for shortness of breath (shortness of breath (related  to COPD)).   amLODipine 5 MG tablet Commonly known as: NORVASC Take 1 tablet (5 mg total) by mouth daily. Patient needs appointment for further refills. 1 st attempt   Aspercreme Lidocaine 4 % Generic drug: lidocaine Place 1 patch onto the skin daily as needed (muscle pain).   aspirin EC 81 MG tablet Take 81 mg by mouth daily.   atorvastatin 20 MG tablet Commonly known as: LIPITOR Take 1 tablet (20 mg total) by mouth daily. Patient needs to keep appointment for further refills. 1 st attempt What changed: when to take this   Breztri Aerosphere 160-9-4.8 MCG/ACT Aero Generic drug: Budeson-Glycopyrrol-Formoterol Inhale 2 puffs into the lungs 2 (two) times daily.   carvedilol 6.25 MG tablet Commonly known as: COREG Take 1 tablet (6.25 mg total) by mouth 2 (two) times daily with a meal. What changed:  medication strength how much to take   cefdinir 300 MG capsule Commonly known as: OMNICEF Take 1 capsule (300 mg total) by mouth 2 (two) times daily for 5 days.   cyanocobalamin 1000 MCG tablet Commonly known as: VITAMIN B12 Take 1,000 mcg by mouth daily.   D3-1000 25 MCG (1000 UT) capsule Generic drug:  Cholecalciferol Take 1,000 Units by mouth daily.   fexofenadine 180 MG tablet Commonly known as: ALLEGRA Take 180 mg by mouth daily.   gabapentin 100 MG capsule Commonly known as: NEURONTIN Take 100 mg by mouth 2 (two) times daily.   guaiFENesin 600 MG 12 hr tablet Commonly known as: MUCINEX Take 1 tablet (600 mg total) by mouth 2 (two) times daily for 14 days.   hydroxychloroquine 200 MG tablet Commonly known as: PLAQUENIL Take 200 mg by mouth daily.   Iron 325 (65 Fe) MG Tabs Take 1 tablet by mouth daily.   leflunomide 10 MG tablet Commonly known as: ARAVA Take 10 mg by mouth daily.   loperamide 2 MG tablet Commonly known as: IMODIUM A-D Take 2 mg by mouth as needed for diarrhea or loose stools (MAX 10 PILLS/24 HRS.).   meclizine 12.5 MG tablet Commonly known as: ANTIVERT Take 12.5 mg by mouth 2 (two) times daily as needed for dizziness.   mirtazapine 7.5 MG tablet Commonly known as: REMERON Take 7.5 mg by mouth at bedtime.   montelukast 10 MG tablet Commonly known as: SINGULAIR Take 1 tablet (10 mg total) by mouth at bedtime.   omeprazole 20 MG capsule Commonly known as: PRILOSEC Take 1 capsule (20 mg total) by mouth daily.   ondansetron 4 MG disintegrating tablet Commonly known as: ZOFRAN-ODT Take 1 tablet (4 mg total) by mouth every 8 (eight) hours as needed for nausea or vomiting.   Oystercal-D 500-10 MG-MCG Tabs Generic drug: Calcium Carb-Cholecalciferol Take 1 tablet by mouth daily.   predniSONE 10 MG tablet Commonly known as: DELTASONE Take 4 tabs daily for 3 days, then take 3 tabs daily for 3 days, then take 2 tabs daily for 3 days, then take 1 tab daily for 3 days, then restart prednisone 5 mg daily as before. What changed: You were already taking a medication with the same name, and this prescription was added. Make sure you understand how and when to take each.   predniSONE 5 MG tablet Commonly known as: DELTASONE Take 1 tablet (5 mg total) by  mouth daily with breakfast. Start taking on: September 09, 2022 What changed: These instructions start on September 09, 2022. If you are unsure what to do until then, ask your doctor or other care  provider.   rOPINIRole 0.5 MG tablet Commonly known as: REQUIP Take 0.5 mg by mouth at bedtime.   saccharomyces boulardii 250 MG capsule Commonly known as: FLORASTOR Take 1 capsule (250 mg total) by mouth 2 (two) times daily for 5 days.   senna-docusate 8.6-50 MG tablet Commonly known as: Senokot-S Take 1 tablet by mouth at bedtime.   Sertraline HCl 150 MG Caps Take 1 capsule by mouth daily.   tiZANidine 2 MG tablet Commonly known as: ZANAFLEX Take 2 mg by mouth 2 (two) times daily.   traMADol 50 MG tablet Commonly known as: ULTRAM Take 1 tablet (50 mg total) by mouth 3 (three) times daily as needed for moderate pain (pain).   valsartan 320 MG tablet Commonly known as: DIOVAN Take 320 mg by mouth in the morning.               Discharge Care Instructions  (From admission, onward)           Start     Ordered   08/29/22 0000  Discharge wound care:        08/29/22 1042             The results of significant diagnostics from this hospitalization (including imaging, microbiology, ancillary and laboratory) are listed below for reference.    Procedures and Diagnostic Studies:   DG Chest Portable 1 View  Result Date: 08/25/2022 CLINICAL DATA:  Shortness of breath EXAM: PORTABLE CHEST 1 VIEW COMPARISON:  08/19/2022, CT 08/20/2022 FINDINGS: Slightly worsened bibasilar infiltrate. Low lung volumes. Stable cardiomediastinal silhouette with aortic atherosclerosis. No pneumothorax. Chronic right shoulder deformity. IMPRESSION: Low lung volumes with worsening bibasilar infiltrates. Electronically Signed   By: Jasmine Pang M.D.   On: 08/25/2022 17:01     Labs:   Basic Metabolic Panel: Recent Labs  Lab 08/25/22 1524 08/26/22 0415 08/28/22 0353  NA 137 137 139  K 4.2 4.2  4.4  CL 100 103 99  CO2 28 27 31   GLUCOSE 118* 113* 109*  BUN 30* 28* 22  CREATININE 1.04* 0.83 0.76  CALCIUM 8.7* 8.6* 9.0   GFR Estimated Creatinine Clearance: 41 mL/min (by C-G formula based on SCr of 0.76 mg/dL). Liver Function Tests: Recent Labs  Lab 08/25/22 1524  AST 15  ALT 11  ALKPHOS 89  BILITOT 0.5  PROT 6.6  ALBUMIN 3.0*   Recent Labs  Lab 08/25/22 1524  LIPASE 52*   No results for input(s): "AMMONIA" in the last 168 hours. Coagulation profile No results for input(s): "INR", "PROTIME" in the last 168 hours.  CBC: Recent Labs  Lab 08/25/22 1524 08/26/22 0415 08/28/22 0353  WBC 11.9* 7.1 10.0  NEUTROABS 10.1*  --  6.8  HGB 9.1* 8.4* 10.3*  HCT 31.0* 27.6* 33.0*  MCV 102.3* 102.2* 99.4  PLT 284 226 356   Cardiac Enzymes: No results for input(s): "CKTOTAL", "CKMB", "CKMBINDEX", "TROPONINI" in the last 168 hours. BNP: Invalid input(s): "POCBNP" CBG: Recent Labs  Lab 08/25/22 2049  GLUCAP 184*   D-Dimer No results for input(s): "DDIMER" in the last 72 hours. Hgb A1c No results for input(s): "HGBA1C" in the last 72 hours. Lipid Profile No results for input(s): "CHOL", "HDL", "LDLCALC", "TRIG", "CHOLHDL", "LDLDIRECT" in the last 72 hours. Thyroid function studies No results for input(s): "TSH", "T4TOTAL", "T3FREE", "THYROIDAB" in the last 72 hours.  Invalid input(s): "FREET3" Anemia work up No results for input(s): "VITAMINB12", "FOLATE", "FERRITIN", "TIBC", "IRON", "RETICCTPCT" in the last 72 hours. Microbiology Recent Results (from  the past 240 hour(s))  Resp panel by RT-PCR (RSV, Flu A&B, Covid) Anterior Nasal Swab     Status: None   Collection Time: 08/20/22  2:08 AM   Specimen: Anterior Nasal Swab  Result Value Ref Range Status   SARS Coronavirus 2 by RT PCR NEGATIVE NEGATIVE Final    Comment: (NOTE) SARS-CoV-2 target nucleic acids are NOT DETECTED.  The SARS-CoV-2 RNA is generally detectable in upper respiratory specimens during  the acute phase of infection. The lowest concentration of SARS-CoV-2 viral copies this assay can detect is 138 copies/mL. A negative result does not preclude SARS-Cov-2 infection and should not be used as the sole basis for treatment or other patient management decisions. A negative result may occur with  improper specimen collection/handling, submission of specimen other than nasopharyngeal swab, presence of viral mutation(s) within the areas targeted by this assay, and inadequate number of viral copies(<138 copies/mL). A negative result must be combined with clinical observations, patient history, and epidemiological information. The expected result is Negative.  Fact Sheet for Patients:  BloggerCourse.com  Fact Sheet for Healthcare Providers:  SeriousBroker.it  This test is no t yet approved or cleared by the [place] FDA and  has been authorized for detection and/or diagnosis of SARS-CoV-2 by FDA under an Emergency Use Authorization (EUA). This EUA will remain  in effect (meaning this test can be used) for the duration of the COVID-19 declaration under Section 564(b)(1) of the Act, 21 U.S.C.section 360bbb-3(b)(1), unless the authorization is terminated  or revoked sooner.       Influenza A by PCR NEGATIVE NEGATIVE Final   Influenza B by PCR NEGATIVE NEGATIVE Final    Comment: (NOTE) The Xpert Xpress SARS-CoV-2/FLU/RSV plus assay is intended as an aid in the diagnosis of influenza from Nasopharyngeal swab specimens and should not be used as a sole basis for treatment. Nasal washings and aspirates are unacceptable for Xpert Xpress SARS-CoV-2/FLU/RSV testing.  Fact Sheet for Patients: BloggerCourse.com  Fact Sheet for Healthcare Providers: SeriousBroker.it  This test is not yet approved or cleared by the [place] FDA and has been authorized for detection and/or  diagnosis of SARS-CoV-2 by FDA under an Emergency Use Authorization (EUA). This EUA will remain in effect (meaning this test can be used) for the duration of the COVID-19 declaration under Section 564(b)(1) of the Act, 21 U.S.C. section 360bbb-3(b)(1), unless the authorization is terminated or revoked.     Resp Syncytial Virus by PCR NEGATIVE NEGATIVE Final    Comment: (NOTE) Fact Sheet for Patients: BloggerCourse.com  Fact Sheet for Healthcare Providers: SeriousBroker.it  This test is not yet approved or cleared by the [place] FDA and has been authorized for detection and/or diagnosis of SARS-CoV-2 by FDA under an Emergency Use Authorization (EUA). This EUA will remain in effect (meaning this test can be used) for the duration of the COVID-19 declaration under Section 564(b)(1) of the Act, 21 U.S.C. section 360bbb-3(b)(1), unless the authorization is terminated or revoked.  Performed at [facility], [address] [place], [place] [address]   Blood culture (routine x 2)     Status: None   Collection Time: 08/20/22  3:24 AM   Specimen: BLOOD  Result Value Ref Range Status   Specimen Description   Final    BLOOD LEFT ANTECUBITAL Performed at [facility], [address], [place], [place] [address]    Special Requests   Final    BOTTLES DRAWN AEROBIC ONLY Blood Culture adequate volume  Performed at [facility], [address] [place], [place] [address]    Culture   Final    NO GROWTH 5 DAYS Performed at [facility], [address] [place], [place] [address]    Report Status 08/25/2022 FINAL  Final  Blood culture (routine x 2)     Status: None   Collection Time: 08/20/22  3:32 AM   Specimen: BLOOD RIGHT FOREARM  Result Value Ref Range Status   Specimen Description   Final    BLOOD RIGHT FOREARM Performed at [facility], [address],  [place], [place] [address]    Special Requests   Final    BOTTLES DRAWN AEROBIC AND ANAEROBIC Blood Culture adequate volume Performed at [facility], [address], [place], [place] [address]    Culture   Final    NO GROWTH 5 DAYS Performed at [facility], [address] [place], [place] [address]    Report Status 08/25/2022 FINAL  Final  MRSA Next Gen by PCR, Nasal     Status: None   Collection Time: 08/20/22  5:22 AM   Specimen: Nasal Mucosa; Nasal Swab  Result Value Ref Range Status   MRSA by PCR Next Gen NOT DETECTED NOT DETECTED Final    Comment: (NOTE) The GeneXpert MRSA Assay (FDA approved for NASAL specimens only), is one component of a comprehensive MRSA colonization surveillance program. It is not intended to diagnose MRSA infection nor to guide or monitor treatment for MRSA infections. Test performance is not FDA approved in patients less than 37 years old. Performed at [facility], [address] [place], [place] [address]   Resp panel by RT-PCR (RSV, Flu A&B, Covid) Anterior Nasal Swab     Status: None   Collection Time: 08/25/22  3:24 PM   Specimen: Anterior Nasal Swab  Result Value Ref Range Status   SARS Coronavirus 2 by RT PCR NEGATIVE NEGATIVE Final    Comment: (NOTE) SARS-CoV-2 target nucleic acids are NOT DETECTED.  The SARS-CoV-2 RNA is generally detectable in upper respiratory specimens during the acute phase of infection. The lowest concentration of SARS-CoV-2 viral copies this assay can detect is 138 copies/mL. A negative result does not preclude SARS-Cov-2 infection and should not be used as the sole basis for treatment or other patient management decisions. A negative result may occur with  improper specimen collection/handling, submission of specimen other than nasopharyngeal swab, presence of viral mutation(s) within the areas targeted by this assay, and inadequate number of viral copies(<138 copies/mL).  A negative result must be combined with clinical observations, patient history, and epidemiological information. The expected result is Negative.  Fact Sheet for Patients:  BloggerCourse.com  Fact Sheet for Healthcare Providers:  SeriousBroker.it  This test is no t yet approved or cleared by the [place] FDA and  has been authorized for detection and/or diagnosis of SARS-CoV-2 by FDA under an Emergency Use Authorization (EUA). This EUA will remain  in effect (meaning this test can be used) for the duration of the COVID-19 declaration under Section 564(b)(1) of the Act, 21 U.S.C.section 360bbb-3(b)(1), unless the authorization is terminated  or revoked sooner.       Influenza A by PCR NEGATIVE NEGATIVE Final   Influenza B by PCR NEGATIVE NEGATIVE Final    Comment: (NOTE) The Xpert Xpress SARS-CoV-2/FLU/RSV plus assay is intended as an aid in the diagnosis of influenza from Nasopharyngeal swab specimens and should not  be used as a sole basis for treatment. Nasal washings and aspirates are unacceptable for Xpert Xpress SARS-CoV-2/FLU/RSV testing.  Fact Sheet for Patients: BloggerCourse.com  Fact Sheet for Healthcare Providers: SeriousBroker.it  This test is not yet approved or cleared by the [place] FDA and has been authorized for detection and/or diagnosis of SARS-CoV-2 by FDA under an Emergency Use Authorization (EUA). This EUA will remain in effect (meaning this test can be used) for the duration of the COVID-19 declaration under Section 564(b)(1) of the Act, 21 U.S.C. section 360bbb-3(b)(1), unless the authorization is terminated or revoked.     Resp Syncytial Virus by PCR NEGATIVE NEGATIVE Final    Comment: (NOTE) Fact Sheet for Patients: BloggerCourse.com  Fact Sheet for Healthcare  Providers: SeriousBroker.it  This test is not yet approved or cleared by the [place] FDA and has been authorized for detection and/or diagnosis of SARS-CoV-2 by FDA under an Emergency Use Authorization (EUA). This EUA will remain in effect (meaning this test can be used) for the duration of the COVID-19 declaration under Section 564(b)(1) of the Act, 21 U.S.C. section 360bbb-3(b)(1), unless the authorization is terminated or revoked.  Performed at [facility], [address], [place], [place] [address]   Blood culture (routine x 2)     Status: None (Preliminary result)   Collection Time: 08/25/22  6:50 PM   Specimen: BLOOD  Result Value Ref Range Status   Specimen Description   Final    BLOOD SITE NOT SPECIFIED Performed at [facility], [address], [place], [place] [address]    Special Requests   Final    BOTTLES DRAWN AEROBIC ONLY Blood Culture results may not be optimal due to an inadequate volume of blood received in culture bottles Performed at [facility], [address], [place], [place] [address]    Culture   Final    NO GROWTH 4 DAYS Performed at [facility], [address] [place], [place] [address]    Report Status PENDING  Incomplete  Blood culture (routine x 2)     Status: None (Preliminary result)   Collection Time: 08/25/22  6:50 PM   Specimen: BLOOD  Result Value Ref Range Status   Specimen Description   Final    BLOOD SITE NOT SPECIFIED Performed at [facility], [address] [place], [place] [address]    Special Requests   Final    BOTTLES DRAWN AEROBIC ONLY Blood Culture results may not be optimal due to an inadequate volume of blood received in culture bottles Performed at [facility], [address], [place], [place] [address]    Culture   Final    NO GROWTH 4 DAYS Performed at [facility], [address], [place], [place] [address]    Report Status PENDING  Incomplete    Time coordinating discharge: 45 minutes  Signed: Rion Schnitzer  [facility] 08/29/2022, 10:42 AM

## 2022-08-29 NOTE — TOC Transition Note (Signed)
Transition of Care [facility]) - CM/SW Discharge Note   Patient Details  Name: Desiree [name] MRN: [id number] Date of Birth: [birth year]  Transition [facility]) CM/SW Contact:  Howell Rucks, RN Phone Number: 08/29/2022, 11:54 AM   Clinical Narrative:  DC order to return to [facility]. NCM called to facility, sw LeeAnn to inform of dc today, confirmed room available. RM 104, Nurse Call Report, main facility number [phone], ask for LeeAnn. LeeAnn confirmed able to send medications with patient, prefer blister packed but will accept in bottles secondary to pharmacy changes at facility. [facility] called. No further TOC needs identified.     Final next level of care: Home w Home Health Services Barriers to Discharge: Barriers Resolved   Patient Goals and CMS Choice      Discharge Placement                  Patient to be transferred to facility by: PT[facility] Name of family member notified: Neysa Bonito (dtr) Patient and family notified of of transfer: 08/29/22  Discharge Plan and Services Additional resources added to the After Visit Summary for                            [facility] Arranged: PT [facility] Agency: [facility] Date [facility] Agency Contacted: 08/29/22 Time HH Agency Contacted: [phone]    Social Determinants of Health (SDOH) Interventions SDOH Screenings   Food Insecurity: No Food Insecurity (08/25/2022)  Housing: Low Risk  (08/25/2022)  Transportation Needs: No Transportation Needs (08/25/2022)  Utilities: Not At Risk (08/25/2022)  Financial Resource Strain: Low Risk  (10/26/2020)  Social Connections: Socially Isolated (10/26/2020)  Tobacco Use: Medium Risk (08/25/2022)     Readmission Risk Interventions    08/27/2022   12:09 PM 08/26/2022    9:53 AM 06/17/2022   12:54 PM  Readmission Risk Prevention Plan  Transportation Screening Complete  Complete  PCP or Specialist Appt within 5-7 Days   Complete  Home Care Screening   Complete  Medication Review  (RN Care Manager) Complete Complete   PCP or Specialist appointment within 3-5 days of discharge Complete    HRI or Home Care Consult Complete Complete   SW Recovery Care/Counseling Consult Complete    Palliative Care Screening Not Applicable Not Applicable   Skilled Nursing Facility Not Applicable Not Applicable

## 2022-09-23 NOTE — Progress Notes (Signed)
[facility]  [address] [place],  [place]  [address] [phone]  Clinic Day:  09/24/2022  Referring physician: No ref. provider found  HISTORY OF PRESENT ILLNESS:  The patient is a 77 y.o. 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  However, recent scans showed hypermetabolic activity in his right lower lobe and a left lingula to where she underwent stereotactic radiation in May 2024.  Unfortunately, since that time, the patient been in the hospital for multiple bouts of pneumonia.  She is currently on antibiotics for another recent pneumonia.  Overall, the patient claims to be feeling much better despite her recent issues with respiratory infections.  She denies having hemoptysis or other symptoms which concern her for a progressive lung cancer being present.  With respect to her cancer history, the patient was diagnosed with stage IIIA (T2 N1 M0) colon cancer, status post a right hemicolectomy in June 2022.  Due to her poor baseline health, adjuvant chemotherapy was not entertained.  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  The patient completed stereotactic radiation for her lung cancer in October 2022.  She has chronic shortness of breath for which she uses 3 L of oxygen.   PHYSICAL EXAM:  Blood pressure (!) 142/62, pulse 86, temperature (!) 97.5 F (36.4 C), resp. rate 20, height 5' (1.524 m), SpO2 (!) 89%. Wt Readings from Last 3 Encounters:  08/25/22 100 lb 12 oz (45.7 kg)  08/19/22 102 lb (46.3 kg)  08/04/22 102 lb (46.3 kg)   Body mass index is 19.68 kg/m. Performance status (ECOG): 3 - Symptomatic, >50% confined to bed Physical Exam Constitutional:      Appearance: Normal appearance. She is ill-appearing.     Comments: Chronically-ill appearing woman who is in a wheelchair, wearing oxygen per nasal  canula.  She looks weaker versus previous visits  HENT:     Mouth/Throat:     Mouth: Mucous membranes are moist.     Pharynx: Oropharynx is clear. No oropharyngeal exudate or posterior oropharyngeal erythema.  Cardiovascular:     Rate and Rhythm: Normal rate and regular rhythm.     Heart sounds: No murmur heard.    No friction rub. No gallop.  Pulmonary:     Effort: Pulmonary effort is normal. No respiratory distress.     Breath sounds: Normal breath sounds. No wheezing, rhonchi or rales.     Comments: Diffusely decreased breath sounds bilaterally Abdominal:     General: Bowel sounds are normal. There is no distension.     Palpations: Abdomen is soft. There is no mass.     Tenderness: There is no abdominal tenderness.  Musculoskeletal:        General: No swelling.     Right lower leg: No edema.     Left lower leg: No edema.  Lymphadenopathy:     Cervical: No cervical adenopathy.     Upper Body:     Right upper body: No supraclavicular or axillary adenopathy.     Left upper body: No supraclavicular or axillary adenopathy.     Lower Body: No right inguinal adenopathy. No left inguinal adenopathy.  Skin:    General: Skin is warm.     Coloration: Skin is not jaundiced.     Findings: No lesion or rash.  Neurological:  General: No focal deficit present.     Mental Status: She is alert and oriented to person, place, and time. Mental status is at baseline.  Psychiatric:        Mood and Affect: Mood normal.        Behavior: Behavior normal.        Thought Content: Thought content normal.    SCANS:  A chest CT done in July 2024 revealed the following: FINDINGS: Cardiovascular: There is adequate opacification of the pulmonary arterial tree. No intraluminal filling defect identified to suggest acute pulmonary embolism. Central pulmonary arteries are of normal caliber. Moderate multi-vessel coronary artery calcification. Mild global cardiomegaly with left ventricular enlargement.  No pericardial effusion. Moderate atherosclerotic calcification within the thoracic aorta. No aortic aneurysm.  Mediastinum/Nodes: No enlarged mediastinal, hilar, or axillary lymph nodes. Thyroid gland, trachea, and esophagus demonstrate no significant findings.  Lungs/Pleura: Reticulonodular infiltrate is seen within the right upper lobe and left lower lobe and there is more extensive infiltrate as well as peripheral consolidation within the right lower lobe in keeping with changes of multifocal infection in the acute setting. Superimposed moderate centrilobular emphysema. Scarring is seen within the posterior basal left lower lobe, unchanged, with fiducial marker in place. Stable scarring within the basilar right middle lobe and lower lingula. No pneumothorax or pleural effusion. 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 There is extensive thickening of the peribronchovascular interstitium, likely related to airway inflammation.  Upper Abdomen: No acute abnormality.  Musculoskeletal: Numerous remote bilateral rib fractures noted. No acute bone abnormality. No lytic or blastic bone lesion  Review of the MIP images confirms the above findings.  IMPRESSION: 1. No pulmonary embolism. 2. Multifocal infiltrate within the right upper lobe and left lower lobe with more extensive infiltrate and peripheral consolidation within the right lower lobe in keeping with changes of multifocal infection in the acute setting. Superimposed airway inflammation. 3. Moderate multi-vessel coronary artery calcification. Mild global cardiomegaly with left ventricular enlargement. 4. Emphysema. 5. 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  ASSESSMENT & PLAN:  A 77 y.o. 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  She also has a history of stage IIIA (T2 N1 M0) colon cancer, status post a right hemicolectomy in June 2022.  The patient also had a right lower lobe lung lesion and left lingula lesion that were hypermetabolic in nature which recently underwent stereotactic radiation in May 2024.  Of note, her most recent chest CT was prominent for pneumonia that would involve both lobes.  As mentioned previously, she is on antibiotics right now to treat another upper respiratory infection.  Clinically, this patient's health remains substandard, primarily as a relates to her lung health.  She knows it will be important for her to work very diligently with pulmonology to ensure her lung health is kept up as much as possible.  Otherwise, I will see this patient back in 4 months for repeat clinical assessment.  A repeat chest CT will be done at that time for his continued radiographic lung cancer surveillance.  A CEA level will also be checked at that time to ensure there is no biochemical evidence of recurrent colon cancer.  The patient understands all the plans discussed today and is  in agreement with them.  Westlynn Fifer Kirby Funk, MD

## 2022-09-24 ENCOUNTER — Other Ambulatory Visit: Payer: Self-pay | Admitting: Oncology

## 2022-09-24 ENCOUNTER — Inpatient Hospital Stay: Payer: Medicare PPO | Attending: Oncology | Admitting: Oncology

## 2022-09-24 VITALS — BP 142/62 | HR 86 | Temp 97.5°F | Resp 20 | Ht 60.0 in

## 2022-09-24 DIAGNOSIS — C3432 Malignant neoplasm of lower lobe, left bronchus or lung: Secondary | ICD-10-CM

## 2022-09-24 DIAGNOSIS — C182 Malignant neoplasm of ascending colon: Secondary | ICD-10-CM | POA: Diagnosis not present

## 2022-09-29 ENCOUNTER — Ambulatory Visit: Payer: Medicare PPO | Admitting: Pulmonary Disease

## 2022-10-02 ENCOUNTER — Ambulatory Visit: Payer: Medicare PPO | Admitting: Pulmonary Disease

## 2022-10-02 NOTE — Progress Notes (Deleted)
Synopsis: Referred in September 2020 for establish care with new pulmonologist former patient of Dr. Kendrick Fries, PCP: No ref. provider found  Subjective:   PATIENT ID: Desiree [name] GENDER: female DOB: [birth year], MRN: [id number]  No chief complaint on file.   77 year old female past medical history of COPD, pulmonary embolism, rheumatoid arthritis, 5 mg prednisone plus Plaquenil, former patient of Dr. Kendrick Fries.  COPD currently managed with Trelegy.  Has had several images following lung nodules since 2017.  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  PET scan July 2019 showed FDG activity SUV 1.348 mm left lower lobe nodule.  IgE 158, Rast panel positive for cat dander, dog dander and Aspergillus fumigate [place].  Last evaluation included sputum cultures, fungus AFB.  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  Appears patient had exacerbation in February and was treated with Levaquin.  As for her RA she is currently on Plaquenil.  Overall she has seen a significant decline in her physical ability.  She states that she is really noticed a significant increase in her dyspnea over the past several months.  Otherwise her respiratory complaints related to her COPD have been stable.  She uses her Trelegy inhaler regularly.  She occasionally describes a central "abnormal feeling in her chest with exertion but most of the time this all dissipates with resting.  She states even the time yes tasks make her severely fatigued as come paired to previous.  Patient denies fevers chills weight loss hemoptysis.  OV 11/08/2018: (Via telephone visit).  Patient scheduled for telephone visit to review CT imaging.  Patient found to have a 2 x 1.3 cm nodule abutting the minor fissure discussed via telephone today.  OV 11/23/2018: Here today to review CT imaging in person.  Patient had pulmonary function test  completed today prior to today's office visit.  PFTs revealed a ratio of 62, FEV1 51% predicted..  Also to review her CT imaging.  Patient very anxious about all of this that is going on.  She recently changed from Trelegy to Spiriva plus Symbicort.  She is doing well with her new inhaler regimen.  She feels less dyspneic.  Patient denies fevers chills night sweats weight loss, cough or sputum production.  OV 08/16/2020: Here today for follow-up after recent hospitalization.  She has been followed throughout our clinic since 2020 via telemetry visits.  Please reference those separate encounters for separate follow-up.  She has a lung nodule that we have been following for some time.  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  CT imaging of the chest was completed on presentationWhich revealed enlargement of the lung nodule that we have been following.  This bilobed left lower lobe nodule had showed slow progression in size.  Since we have been following this for some time I suspect we are dealing with a separate malignancy in comparison to her recent colon cancer diagnosis.  Patient was taken to the operating room by general surgery and ultimately discharged on 08/05/2020.  Here today for follow-up.  Recently saw general surgery in follow-up.  She has some drainage from the incision site felt to be infected was started on antibiotics.  Respiratory standpoint she is stable.  On 2 L nasal cannula continuous.  Currently residing in an assisted living.  Has follow-up scheduled with medical oncology soon.  OV 04/21/2022: Here today for follow-up.Patient here today after having a CT scan of the chest completed in March for follow-up with medical oncology.  Patient was found to have a 2.1 x 1.5 cm right lower lobe paraspinal nodule that has been  progressive since in October 2023 CT imaging which originally measured 1.5 in largest cross-section concerning for metastatic disease or metachronous primary.  Also has a nodular opacity within the lingula consistent with more of an infectious or inflammatory etiology.  Other scattered tree-in-bud disease no evidence of other distant metastatic disease.  From baseline she has 3 L pulse.  Frail, down to a weight of 105.  Using her inhalers regularly.  Lives in a skilled facility.  OV 10/02/2022: Here today for follow-up.Patient was recently seen in the emergency department with CT scan of the chest on 08/20/2022 that shows a multifocal infiltrate in the right upper lobe and left lower lobe concerning for pneumonia.  Patient had follow-up chest x-ray in the emergency room on 08/25/2022 worsened bilateral infiltrates.  She was treated and discharged.  Presented back to the oncology office on 09/24/2022, office note Dr. Melvyn Neth reviewed. Patient has follow-up CT imaging scheduled by oncology.  They also checked a CEA level.    Past Medical History:  Diagnosis Date  . Abnormal nuclear stress test   . AF (paroxysmal atrial fibrillation) (HCC)   . Age-related osteoporosis without current pathological fracture 05/16/2016  . Alcoholism (HCC) 10/21/2018   Formatting of this note might be different from the original. 2 glasses a day.  . Allergic rhinitis 07/17/2015  . At high risk for falls 07/14/2017  . Atherosclerotic heart disease of native coronary artery without angina pectoris 04/09/2021  . 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HCC) 07/14/2017   2019. Wrist surgery  . Benign paroxysmal positional vertigo due to bilateral vestibular disorder 02/09/2020  . Cancer of ascending colon (HCC)   . Carcinoma of ascending colon (HCC)   . Cat allergies   . Centrilobular emphysema (HCC) 05/16/2016  . Chronic anemia 04/09/2021  . Chronic atrial fibrillation, unspecified (HCC) 10/04/2020  . Chronic obstructive pulmonary disease  (HCC) 04/09/2021  . Chronic pain 04/09/2021  . CKD (chronic kidney disease) stage 3, GFR 30-59 ml/min (HCC) 01/04/2018  . COPD (chronic obstructive pulmonary disease) (HCC)   . Cysts of both ovaries 11/24/2019   Formatting of this note might be different from the original. On CT 11/2019. Referred to GYN.  Marland Kitchen Dyspnea   . Early satiety 04/14/2019  . Essential hypertension 08/22/2010   Formatting of this note might be different from the original. Formatting of this note might be different from the original. D/C ACE 08/22/2010 due to concerns with freq aecopd/ pseudoasthma > resolved  Last Assessment & Plan:  Formatting of this note might be different from the original. Adequate control on present rx, reviewed - note she's no longer falling apart during her aecopd's which are less  . Gastro-esophageal reflux disease without esophagitis 07/14/2017  . Gastrointestinal hemorrhage   . History of pulmonary embolus (PE) 07/14/2017  . History of radiation therapy    Left lung- SBRT 11/13/20-11/27/20- Dr. Antony Blackbird  . History of radiation therapy    Right and Left Lung 06/16/22-06/27/22- Dr. Antony Blackbird  . Hypertension   . Hypothyroidism 04/09/2021  . Idiopathic peripheral neuropathy 02/23/2020  . Iron deficiency anemia due to chronic blood loss 10/27/2018  . 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 Lung cancer, lower lobe (HCC) 09/24/2020  . Lung nodule, solitary 08/16/2020   Added automatically from request for surgery [phone]  . Malaise and fatigue 07/14/2017  . Malignant neoplasm of unspecified part of left bronchus or lung (HCC) 04/09/2021  . Malignant tumor of ascending colon (HCC) 04/09/2021  . Mild CAD 02/02/2019  . Mild episode of recurrent major depressive disorder (HCC) 07/14/2017  . Mixed hyperlipidemia 07/14/2017  . Multiple lung nodules on CT 05/16/2016  . Osteoporosis   . Other cirrhosis of liver (HCC) 11/24/2019   Formatting of this note might be  different from the original. Possible???? See CT comments 11/2019.  Marland Kitchen Other vitamin B12 deficiency anemias 04/09/2021  . Paroxysmal atrial fibrillation (HCC) 04/14/2019   Formatting of this note might be different from the original. [facility].  . Prediabetes 07/14/2017  . Pulmonary embolism (HCC) 2017  . Rheumatoid arthritis (HCC) 09/20/2010   Dr Nickola Major in [place].  Marland Kitchen Rheumatoid arthritis(714.0)    Dr Azzie Roup follows  . Schatzki's ring   . Senile purpura (HCC) 01/04/2018  . Skin rash 07/14/2017   Bx'd Allergic. Likely meds.  . Status post thoracentesis   . Symptomatic anemia 07/25/2020  . Type 2 diabetes mellitus without complications (HCC) 04/09/2021  . Vitamin B12 deficiency 07/14/2017  . Vitamin D deficiency 04/09/2021     Family History  Problem Relation Age of Onset  . Heart disease Father   . Stroke Mother   . Clotting disorder Mother   . Heart disease Sister   . Sleep apnea Sister   . Emphysema Sister   . Breast cancer Maternal Aunt   . Emphysema Paternal Grandmother      Past Surgical History:  Procedure Laterality Date  . BIOPSY  07/27/2020   Procedure: BIOPSY;  Surgeon: Iva Boop, MD;  Location: [facility] ENDOSCOPY;  Service: Endoscopy;;  . BRONCHIAL BIOPSY  09/11/2020   Procedure: BRONCHIAL BIOPSIES;  Surgeon: Josephine Igo, DO;  Location: [facility] ENDOSCOPY;  Service: Pulmonary;;  . BRONCHIAL BRUSHINGS  09/11/2020   Procedure: BRONCHIAL BRUSHINGS;  Surgeon: Josephine Igo, DO;  Location: [facility] ENDOSCOPY;  Service: Pulmonary;;  . BRONCHIAL NEEDLE ASPIRATION BIOPSY  09/11/2020   Procedure: BRONCHIAL NEEDLE ASPIRATION BIOPSIES;  Surgeon: Josephine Igo, DO;  Location: [facility] ENDOSCOPY;  Service: Pulmonary;;  . BRONCHIAL WASHINGS  09/11/2020   Procedure: BRONCHIAL WASHINGS;  Surgeon: Josephine Igo, DO;  Location: [facility] ENDOSCOPY;  Service: Pulmonary;;  . COLONOSCOPY N/A 07/27/2020   Procedure: COLONOSCOPY;  Surgeon: Iva Boop, MD;  Location: [facility] ENDOSCOPY;   Service: Endoscopy;  Laterality: N/A;  . CYSTECTOMY Right    breast  . ESOPHAGOGASTRODUODENOSCOPY (EGD) WITH PROPOFOL N/A 07/27/2020   Procedure: ESOPHAGOGASTRODUODENOSCOPY (EGD) WITH PROPOFOL;  Surgeon: Iva Boop, MD;  Location: [facility] ENDOSCOPY;  Service: Endoscopy;  Laterality: N/A;  . FIDUCIAL MARKER PLACEMENT  09/11/2020   Procedure: FIDUCIAL MARKER PLACEMENT;  Surgeon: Josephine Igo, DO;  Location: [facility] ENDOSCOPY;  Service: Pulmonary;;  . LAPAROSCOPIC RIGHT HEMI COLECTOMY Right 07/31/2020   Procedure: LAPAROSCOPIC ASSISSTED  RIGHT HEMI COLECTOMY;  Surgeon: Manus Rudd, MD;  Location: [facility] OR;  Service: General;  Laterality: Right;  . LEFT HEART CATH AND CORONARY ANGIOGRAPHY N/A 01/05/2019   Procedure: LEFT HEART CATH AND CORONARY ANGIOGRAPHY;  Surgeon: Corky Crafts, MD;  Location: [facility] INVASIVE CV LAB;  Service: Cardiovascular;  Laterality: N/A;  . SUBMUCOSAL TATTOO INJECTION  07/27/2020   Procedure: SUBMUCOSAL TATTOO INJECTION;  Surgeon:  Iva Boop, MD;  Location: [facility] ENDOSCOPY;  Service: Endoscopy;;  . VIDEO BRONCHOSCOPY WITH ENDOBRONCHIAL NAVIGATION Left 09/11/2020   Procedure: VIDEO BRONCHOSCOPY WITH ENDOBRONCHIAL NAVIGATION;  Surgeon: Josephine Igo, DO;  Location: [facility] ENDOSCOPY;  Service: Pulmonary;  Laterality: Left;  ION  . VIDEO BRONCHOSCOPY WITH RADIAL ENDOBRONCHIAL ULTRASOUND  09/11/2020   Procedure: RADIAL ENDOBRONCHIAL ULTRASOUND;  Surgeon: Josephine Igo, DO;  Location: [facility] ENDOSCOPY;  Service: Pulmonary;;    Social History   Socioeconomic History  . Marital status: Widowed    Spouse name: Not on file  . Number of children: Not on file  . Years of education: Not on file  . Highest education level: Not on file  Occupational History  . Occupation: retired  Tobacco Use  . Smoking status: Former    Current packs/day: 0.00    Average packs/day: 1 pack/day for 40.0 years (40.0 ttl pk-yrs)    Types: Cigarettes    Start date: 02/03/1961    Quit date: 02/03/2001     Years since quitting: 21.6  . Smokeless tobacco: Never  Vaping Use  . Vaping status: Never Used  Substance and Sexual Activity  . Alcohol use: Yes    Alcohol/week: 2.0 standard drinks of alcohol    Types: 2 Standard drinks or equivalent per week    Comment: 2 drinks daily  . Drug use: No  . Sexual activity: Not on file  Other Topics Concern  . Not on file  Social History Narrative   Tobacco use, amount per day now: NONE   Past tobacco use, amount per day: 1 PACK PER DAY   How many years did you use tobacco:TOO MANY STOPPED IN 2003   Alcohol use (drinks per week): 14   Diet: GOOD   Do you drink/eat things with caffeine: YES   Marital status: WIDOW                What year were you married? 1967   Do you live in a house, apartment, assisted living, condo, trailer, etc.? HOUSE   Is it one or more stories? SPLIT LEVEL   How many persons live in your home? 1   Do you have pets in your home?( please list) NONE   Current or past profession: PAST RECORDS TECHNICIAN AT [employer]   Do you exercise?  NO                                Type and how often?   Do you have a living will? YES    Do you have a DNR form?  YES                                 If not, do you want to discuss one?   Do you have signed POA/HPOA forms?        ??              If so, please bring to you appointment      [facility] (05/16/16):   Originally from [place], [place]. 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 No pets currently. No bird, mold, or hot tub exposure.    Social Determinants of Health   Financial Resource Strain: Low Risk  (10/26/2020)   Overall Financial Resource Strain (CARDIA)   . Difficulty of Paying Living  Expenses: Not very hard  Food Insecurity: No Food Insecurity (08/25/2022)   Hunger Vital Sign   . Worried About Programme researcher, broadcasting/film/video in the Last Year: Never true   . Ran Out of Food in the Last Year: Never true  Transportation Needs: No  Transportation Needs (08/25/2022)   PRAPARE - Transportation   . Lack of Transportation (Medical): No   . Lack of Transportation (Non-Medical): No  Physical Activity: Not on file  Stress: Not on file  Social Connections: Socially Isolated (10/26/2020)   Social Connection and Isolation Panel [NHANES]   . Frequency of Communication with Friends and Family: Twice a week   . Frequency of Social Gatherings with Friends and Family: Never   . Attends Religious Services: Never   . Active Member of Clubs or Organizations: No   . Attends Banker Meetings: Never   . Marital Status: Widowed  Intimate Partner Violence: Not At Risk (08/25/2022)   Humiliation, Afraid, Rape, and Kick questionnaire   . Fear of Current or Ex-Partner: No   . Emotionally Abused: No   . Physically Abused: No   . Sexually Abused: No     Allergies  Allergen Reactions  . Other Shortness Of Breath    Cats  . Oxycodone Other (See Comments)    hallucinations     Outpatient Medications Prior to Visit  Medication Sig Dispense Refill  . acetaminophen (TYLENOL) 325 MG tablet Take 325 mg by mouth every 6 (six) hours as needed for moderate pain or headache.    . albuterol (VENTOLIN HFA) 108 (90 Base) MCG/ACT inhaler Inhale 2 puffs into the lungs every 4 (four) hours as needed for shortness of breath (shortness of breath (related to COPD)).    Marland Kitchen amLODipine (NORVASC) 5 MG tablet Take 1 tablet (5 mg total) by mouth daily. Patient needs appointment for further refills. 1 st attempt 30 tablet 0  . aspirin EC 81 MG tablet Take 81 mg by mouth daily.    Marland Kitchen atorvastatin (LIPITOR) 20 MG tablet Take 1 tablet (20 mg total) by mouth daily. Patient needs to keep appointment for further refills. 1 st attempt (Patient taking differently: Take 20 mg by mouth every evening. Patient needs to keep appointment for further refills. 1 st attempt) 30 tablet 0  . Budeson-Glycopyrrol-Formoterol (BREZTRI AEROSPHERE) 160-9-4.8 MCG/ACT AERO Inhale  2 puffs into the lungs 2 (two) times daily. 10.7 g 4  . carvedilol (COREG) 6.25 MG tablet Take 1 tablet (6.25 mg total) by mouth 2 (two) times daily with a meal. 60 tablet 0  . Cholecalciferol (D3-1000) 25 MCG (1000 UT) capsule Take 1,000 Units by mouth daily.    . Ferrous Sulfate (IRON) 325 (65 Fe) MG TABS Take 1 tablet by mouth daily.    . fexofenadine (ALLEGRA) 180 MG tablet Take 180 mg by mouth daily.    Marland Kitchen gabapentin (NEURONTIN) 100 MG capsule Take 100 mg by mouth 2 (two) times daily.    . hydroxychloroquine (PLAQUENIL) 200 MG tablet Take 200 mg by mouth daily.  3  . leflunomide (ARAVA) 10 MG tablet Take 10 mg by mouth daily.    Marland Kitchen lidocaine (ASPERCREME LIDOCAINE) 4 % Place 1 patch onto the skin daily as needed (muscle pain).    Marland Kitchen loperamide (IMODIUM A-D) 2 MG tablet Take 2 mg by mouth as needed for diarrhea or loose stools (MAX 10 PILLS/24 HRS.).    Marland Kitchen meclizine (ANTIVERT) 12.5 MG tablet Take 12.5 mg by mouth 2 (two) times daily  as needed for dizziness.    . mirtazapine (REMERON) 7.5 MG tablet Take 7.5 mg by mouth at bedtime.    . montelukast (SINGULAIR) 10 MG tablet Take 1 tablet (10 mg total) by mouth at bedtime. 90 tablet 1  . omeprazole (PRILOSEC) 20 MG capsule Take 1 capsule (20 mg total) by mouth daily. 90 capsule 1  . ondansetron (ZOFRAN-ODT) 4 MG disintegrating tablet Take 1 tablet (4 mg total) by mouth every 8 (eight) hours as needed for nausea or vomiting. 10 tablet 0  . OYSTERCAL-D 500-10 MG-MCG TABS Take 1 tablet by mouth daily.    . predniSONE (DELTASONE) 10 MG tablet Take 4 tabs daily for 3 days, then take 3 tabs daily for 3 days, then take 2 tabs daily for 3 days, then take 1 tab daily for 3 days, then restart prednisone 5 mg daily as before. 30 tablet 0  . predniSONE (DELTASONE) 5 MG tablet Take 1 tablet (5 mg total) by mouth daily with breakfast.    . rOPINIRole (REQUIP) 0.5 MG tablet Take 0.5 mg by mouth at bedtime.    . senna-docusate (SENOKOT-S) 8.6-50 MG tablet Take 1  tablet by mouth at bedtime. 30 tablet 0  . Sertraline HCl 150 MG CAPS Take 1 capsule by mouth daily.    Marland Kitchen tiZANidine (ZANAFLEX) 2 MG tablet Take 2 mg by mouth 2 (two) times daily.    . traMADol (ULTRAM) 50 MG tablet Take 1 tablet (50 mg total) by mouth 3 (three) times daily as needed for moderate pain (pain). 15 tablet 0  . valsartan (DIOVAN) 320 MG tablet Take 320 mg by mouth in the morning.    . vitamin B-12 (CYANOCOBALAMIN) 1000 MCG tablet Take 1,000 mcg by mouth daily.      No facility-administered medications prior to visit.    Review of Systems  Constitutional:  Positive for malaise/fatigue. Negative for chills, fever and weight loss.  HENT:  Negative for hearing loss, sore throat and tinnitus.   Eyes:  Negative for blurred vision and double vision.  Respiratory:  Positive for shortness of breath. Negative for cough, hemoptysis, sputum production, wheezing and stridor.   Cardiovascular:  Negative for chest pain, palpitations, orthopnea, leg swelling and PND.  Gastrointestinal:  Negative for abdominal pain, constipation, diarrhea, heartburn, nausea and vomiting.  Genitourinary:  Negative for dysuria, hematuria and urgency.  Musculoskeletal:  Negative for joint pain and myalgias.  Skin:  Negative for itching and rash.  Neurological:  Negative for dizziness, tingling, weakness and headaches.  Endo/Heme/Allergies:  Negative for environmental allergies. Does not bruise/bleed easily.  Psychiatric/Behavioral:  Negative for depression. The patient is not nervous/anxious and does not have insomnia.   All other systems reviewed and are negative.    Objective:  Physical Exam Vitals reviewed.  Constitutional:      General: She is not in acute distress.    Appearance: She is well-developed.  HENT:     Head: Normocephalic and atraumatic.     Mouth/Throat:     Pharynx: No oropharyngeal exudate.  Eyes:     Conjunctiva/sclera: Conjunctivae normal.     Pupils: Pupils are equal, round, and  reactive to light.  Neck:     Vascular: No JVD.     Trachea: No tracheal deviation.     Comments: Loss of supraclavicular fat Cardiovascular:     Rate and Rhythm: Normal rate and regular rhythm.     Heart sounds: S1 normal and S2 normal.     Comments: Distant heart  tones Pulmonary:     Effort: No tachypnea or accessory muscle usage.     Breath sounds: No stridor. Decreased breath sounds (throughout all lung fields) present. No wheezing, rhonchi or rales.     Comments: Severely diminished breath sounds bilaterally Abdominal:     General: Bowel sounds are normal. There is no distension.     Palpations: Abdomen is soft.     Tenderness: There is no abdominal tenderness.  Musculoskeletal:        General: Deformity (muscle wasting ) present.  Skin:    General: Skin is warm and dry.     Capillary Refill: Capillary refill takes less than 2 seconds.     Findings: No rash.  Neurological:     Mental Status: She is alert and oriented to person, place, and time.  Psychiatric:        Behavior: Behavior normal.     There were no vitals filed for this visit.    on RA BMI Readings from Last 3 Encounters:  09/24/22 19.68 kg/m  08/25/22 19.68 kg/m  08/19/22 19.92 kg/m   Wt Readings from Last 3 Encounters:  08/25/22 100 lb 12 oz (45.7 kg)  08/19/22 102 lb (46.3 kg)  08/04/22 102 lb (46.3 kg)     CBC    Component Value Date/Time   WBC 10.0 08/28/2022 0353   RBC 3.32 (L) 08/28/2022 0353   HGB 10.3 (L) 08/28/2022 0353   HGB 9.7 (L) 04/09/2022 1037   HGB 11.1 09/30/2019 1151   HCT 33.0 (L) 08/28/2022 0353   HCT 33.4 (L) 09/30/2019 1151   PLT 356 08/28/2022 0353   PLT 265 04/09/2022 1037   PLT 333 09/30/2019 1151   MCV 99.4 08/28/2022 0353   MCV 99 (H) 09/30/2019 1151   MCH 31.0 08/28/2022 0353   MCHC 31.2 08/28/2022 0353   RDW 13.1 08/28/2022 0353   RDW 12.3 09/30/2019 1151   LYMPHSABS 1.0 08/28/2022 0353   LYMPHSABS 0.6 (L) 10/03/2015 1512   MONOABS 1.3 (H) 08/28/2022  0353   EOSABS 0.6 (H) 08/28/2022 0353   EOSABS 0.2 10/03/2015 1512   BASOSABS 0.1 08/28/2022 0353   BASOSABS 0.0 10/03/2015 1512    Chest Imaging: CT chest 03/03/2017: Evidence of infectious bronchiolitis, 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  Moderate emphysema. The patient's images have been independently reviewed by me.    CT chest September 2020: Reviewed with patient today in the office.  Does have 2 cm right upper lobe nodule with area of inflammatory appearing groundglass around this. Also has evidence of MAI and cylindric bronchiectasis. The patient's images have been independently reviewed by me.    CT chest 07/28/2020: Small pleural effusion on the right side, slowly enlarging left lower lobe pulmonary nodule concerning for a primary bronchogenic carcinoma.  Has increased in size comparison to previous imaging. The patient's images have been independently reviewed by me.    CT chest September 2023: Paraspinal right lower lobe 1.4 cm nodule. The patient's images have been independently reviewed by me.    CT March 2024: Right paraspinal nodule increased in size now 2.5 cm. Other small nodules in the chest. The patient's images have been independently reviewed by me.    Pulmonary Functions Testing Results:    Latest Ref Rng & Units 11/23/2018   12:50 PM 09/05/2016   12:57 PM  PFT Results  FVC-Pre L 1.46  1.71   FVC-Predicted Pre % 59  67   FVC-Post  L 1.52  1.75   FVC-Predicted Post % 61  69   Pre FEV1/FVC % % 63  66   Post FEV1/FCV % % 62  69   FEV1-Pre L 0.91  1.13   FEV1-Predicted Pre % 49  59   FEV1-Post L 0.94  1.21   DLCO uncorrected ml/min/mmHg 9.72  13.43   DLCO UNC% % 56  66   DLCO corrected ml/min/mmHg  14.03   DLCO COR %Predicted %  69   DLVA Predicted % 72  86   TLC L 4.17  4.77   TLC % Predicted % 90  103   RV % Predicted % 113  128     FeNO: None   Pathology:  None  07/31/2020: SURGICAL PATHOLOGY  THIS IS AN ADDENDUM REPORT CASE: [id number]  PATIENT: Chinelo Hitchner  Surgical Pathology Report  ddendum   Reason for Addendum #1:  DNA Mismatch Repair IHC Results   Clinical History: right colon mass (cm)  FINAL MICROSCOPIC DIAGNOSIS:   A. COLON, RIGHT AND TERMINAL ILEUM, HEMI-COLECTOMY:  -  Adenocarcinoma, poorly differentiated, 3.4 cm  -  Metastatic carcinoma involving one of sixteen lymph nodes (1/16)  -  Margins uninvolved by carcinoma  -  Multiple tubular adenomas (0.8 cm; largest)  -  Benign appendix  -  See oncology table and comment below   Echocardiogram: None   Heart Catheterization: None     Assessment & Plan:   No diagnosis found.    Discussion:  This is a 77 year old female, moderate COPD by PFT with FEV1 of 51% predicted on previous, now on pulse 3 L with chronic hypoxemic respiratory failure.  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  Had an enlarging nodule in the lung was taken for bronchoscopy and biopsy diagnosed with adenocarcinoma of the lung.  She underwent SBRT treatments.  Now has a enlarging nodule on the right lower lobe.  She was referred for discussion today for consideration of repeat biopsy.  I think she is high risk for consideration of repeat biopsy due to her medical comorbidities.  And current functional status.  Plan: I will send her back to radiation oncology for consideration of empiric radiation treatments. I would prefer not to consider general anesthesia and repeat biopsy if possible. She would be high risk for any type of general anesthetic. I discussed these options with the patient as well as family present.  They are all in agreement of this plan. I will discuss case with radiation oncology.  I will let them decide if they think a repeat PET would be of utility. Thanks for the referral and follow-up.  Josephine Igo, DO [facility] 10/02/2022 1:20 PM

## 2022-10-03 ENCOUNTER — Telehealth: Payer: Self-pay | Admitting: Pulmonary Disease

## 2022-10-03 NOTE — Telephone Encounter (Signed)
Pt daughter called in bc her mother is not doing well says her levels has been bouncing from 70s-90s

## 2022-10-09 ENCOUNTER — Ambulatory Visit (INDEPENDENT_AMBULATORY_CARE_PROVIDER_SITE_OTHER): Payer: Medicare PPO

## 2022-10-09 ENCOUNTER — Ambulatory Visit: Payer: Medicare PPO | Admitting: Primary Care

## 2022-10-09 ENCOUNTER — Encounter: Payer: Self-pay | Admitting: Primary Care

## 2022-10-09 VITALS — BP 98/62 | HR 71 | Temp 97.5°F | Ht 60.0 in | Wt 103.2 lb

## 2022-10-09 DIAGNOSIS — J189 Pneumonia, unspecified organism: Secondary | ICD-10-CM | POA: Diagnosis not present

## 2022-10-09 DIAGNOSIS — J9611 Chronic respiratory failure with hypoxia: Secondary | ICD-10-CM

## 2022-10-09 MED ORDER — ALBUTEROL SULFATE (2.5 MG/3ML) 0.083% IN NEBU: 2.5000 mg | INHALATION_SOLUTION | Freq: Four times a day (QID) | RESPIRATORY_TRACT | 12 refills | Status: DC | PRN

## 2022-10-09 NOTE — Telephone Encounter (Signed)
Patient seen in office today. 

## 2022-10-09 NOTE — Progress Notes (Signed)
@Patient  ID: Desiree [name], female    DOB: 03-Oct-1945, 77 y.o.   MRN: [id number]  Chief Complaint  Patient presents with   Follow-up    PHOS-sob occass. Good and bad days, denies cough/wheeze    Referring provider: No ref. provider found  HPI:    10/09/2022 Patient presents today for hospital follow-up.  She was re-admitted from 08/25/2022 - 08/29/2022 for incomplete treatment of community-acquired pneumonia and acute on chronic respiratory failure.  Treated with IV Rocephin and azithromycin.  Discharged on 5-day course of Omnicef and slow prednisone taper.  She is on 3 L of oxygen at baseline.  CTA chest on 7/17 showed emphysema and bronchomalacia with marked narrowing and complete collapse of the right bronchus intermedius and right main stem bronchus.  She is doing well. Respiratory wise she has no active complaints. She had a spell of shortness of breath last week at her facility which lasted about an hour. Wearing 3L oxygen. She using Breztri twice daily. She uses Albuterol recue inhaler once a week on average.  She has a nebulizer machine but no treatments. Reports O2 can drop into 70s. She gets some activity, tells me she walks as much as she can. She uses a cane to get around. Denies active cough or wheezing symptoms. No fevers.    Allergies  Allergen Reactions   Other Shortness Of Breath    Cats   Oxycodone Other (See Comments)    hallucinations    Immunization History  Administered Date(s) Administered   Influenza Split 11/12/2017   Influenza Whole 11/22/2010   Influenza, High Dose Seasonal PF 11/08/2019   Influenza,inj,Quad PF,6+ Mos 11/12/2017, 10/21/2018   Influenza-Unspecified 12/04/2016, 11/12/2017   Moderna Sars-Covid-2 Vaccination 02/25/2019, 03/23/2019, 05/14/2020   Pneumococcal Conjugate-13 02/03/2013, 09/05/2016   Pneumococcal Polysaccharide-23 12/02/2010, 02/03/2014   Zoster Recombinant(Shingrix) 06/05/2017, 11/16/2017    Past Medical History:   Diagnosis Date   Abnormal nuclear stress test    AF (paroxysmal atrial fibrillation) (HCC)    Age-related osteoporosis without current pathological fracture 05/16/2016   Alcoholism (HCC) 10/21/2018   Formatting of this note might be different from the original. 2 glasses a day.   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 Wrist surgery   Benign paroxysmal positional vertigo due to bilateral vestibular disorder 02/09/2020   Cancer of ascending colon [facility])    Carcinoma of ascending colon (HCC)    Cat allergies    Centrilobular emphysema (HCC) 05/16/2016   Chronic anemia 04/09/2021   Chronic atrial fibrillation, unspecified (HCC) 10/04/2020   Chronic obstructive pulmonary disease (HCC) 04/09/2021   Chronic pain 04/09/2021   CKD (chronic kidney disease) stage 3, GFR 30-59 ml/min (HCC) 01/04/2018   COPD (chronic obstructive pulmonary disease) (HCC)    Cysts of both ovaries 11/24/2019   Formatting of this note might be different from the original. On CT 11/2019. Referred to GYN.   Dyspnea    Early satiety 04/14/2019   Essential hypertension 08/22/2010   Formatting of this note might be different from the original. Formatting of this note might be different from the original. D/C ACE 08/22/2010 due to concerns with freq aecopd/ pseudoasthma > resolved  Last Assessment & Plan:  Formatting of this note might be different from the original. Adequate control on present rx, reviewed - note she's no longer falling apart during her aecopd's  which are less   Gastro-esophageal reflux disease without esophagitis 07/14/2017   Gastrointestinal hemorrhage    History of pulmonary embolus (PE) 07/14/2017   History of radiation therapy    Left lung- SBRT 11/13/20-11/27/20- Dr. Antony Blackbird   History of radiation therapy    Right and Left Lung  06/16/22-06/27/22- Dr. 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 Possible???? See CT comments 11/2019.   Other vitamin B12 deficiency anemias 04/09/2021   Paroxysmal atrial fibrillation (HCC) 04/14/2019   Formatting of this note might be different from the original. [facility].   Prediabetes 07/14/2017   Pulmonary embolism (HCC) 2017   Rheumatoid arthritis (HCC) 09/20/2010   Dr Nickola Major in [place].   Rheumatoid arthritis(714.0)    Dr Azzie Roup follows   Schatzki's ring    Senile purpura (HCC) 01/04/2018   Skin rash 07/14/2017   Bx'd Allergic. Likely meds.   Status post thoracentesis    Symptomatic anemia 07/25/2020   Type 2 diabetes mellitus without complications (HCC) 04/09/2021   Vitamin B12 deficiency 07/14/2017   Vitamin D deficiency 04/09/2021    Tobacco History: Social History   Tobacco Use  Smoking Status Former   Current packs/day: 0.00   Average packs/day: 1 pack/day for 40.0 years (40.0 ttl pk-yrs)   Types: Cigarettes   Start date: 02/03/1961   Quit date: 02/03/2001   Years since quitting: 21.7  Smokeless Tobacco Never   Counseling given: Not Answered   Outpatient Medications  Prior to Visit  Medication Sig Dispense Refill   acetaminophen (TYLENOL) 325 MG tablet Take 325 mg by mouth every 6 (six) hours as needed for moderate pain or headache.     albuterol (VENTOLIN HFA) 108 (90 Base) MCG/ACT inhaler Inhale 2 puffs into the lungs every 4 (four) hours as needed for shortness of breath (shortness of breath (related to COPD)).     amLODipine (NORVASC) 5 MG tablet Take 1 tablet (5 mg total) by mouth daily. Patient needs appointment for further refills. 1 st attempt 30 tablet 0   aspirin EC 81 MG tablet Take 81 mg by mouth daily.     atorvastatin (LIPITOR) 20 MG tablet Take 1 tablet (20 mg total) by mouth daily. Patient needs to keep appointment for further refills. 1 st attempt (Patient taking differently: Take 20 mg by mouth every evening. Patient needs to keep appointment for further refills. 1 st attempt) 30 tablet 0   Budeson-Glycopyrrol-Formoterol (BREZTRI AEROSPHERE) 160-9-4.8 MCG/ACT AERO Inhale 2 puffs into the lungs 2 (two) times daily. 10.7 g 4   carvedilol (COREG) 6.25 MG tablet Take 1 tablet (6.25 mg total) by mouth 2 (two) times daily with a meal. 60 tablet 0   Ferrous  Sulfate (IRON) 325 (65 Fe) MG TABS Take 1 tablet by mouth daily.     fexofenadine (ALLEGRA) 180 MG tablet Take 180 mg by mouth daily.     gabapentin (NEURONTIN) 100 MG capsule Take 100 mg by mouth 2 (two) times daily.     hydroxychloroquine (PLAQUENIL) 200 MG tablet Take 200 mg by mouth daily.  3   lidocaine (ASPERCREME LIDOCAINE) 4 % Place 1 patch onto the skin daily as needed (muscle pain).     loperamide (IMODIUM A-D) 2 MG tablet Take 2 mg by mouth as needed for diarrhea or loose stools (MAX 10 PILLS/24 HRS.).     meclizine (ANTIVERT) 12.5 MG tablet Take 12.5 mg by mouth 2 (two) times daily as needed for dizziness.     mirtazapine (REMERON) 7.5 MG tablet Take 7.5 mg by mouth at bedtime.     montelukast (SINGULAIR) 10 MG tablet Take 1 tablet (10 mg total) by mouth at bedtime. 90 tablet 1    omeprazole (PRILOSEC) 20 MG capsule Take 1 capsule (20 mg total) by mouth daily. 90 capsule 1   ondansetron (ZOFRAN-ODT) 4 MG disintegrating tablet Take 1 tablet (4 mg total) by mouth every 8 (eight) hours as needed for nausea or vomiting. 10 tablet 0   OYSTERCAL-D 500-10 MG-MCG TABS Take 1 tablet by mouth daily.     predniSONE (DELTASONE) 5 MG tablet Take 1 tablet (5 mg total) by mouth daily with breakfast.     rOPINIRole (REQUIP) 0.5 MG tablet Take 0.5 mg by mouth at bedtime.     senna-docusate (SENOKOT-S) 8.6-50 MG tablet Take 1 tablet by mouth at bedtime. 30 tablet 0   Sertraline HCl 150 MG CAPS Take 1 capsule by mouth daily.     traMADol (ULTRAM) 50 MG tablet Take 1 tablet (50 mg total) by mouth 3 (three) times daily as needed for moderate pain (pain). 15 tablet 0   valsartan (DIOVAN) 320 MG tablet Take 320 mg by mouth in the morning.     vitamin B-12 (CYANOCOBALAMIN) 1000 MCG tablet Take 1,000 mcg by mouth daily.      Cholecalciferol (D3-1000) 25 MCG (1000 UT) capsule Take 1,000 Units by mouth daily.     leflunomide (ARAVA) 10 MG tablet Take 10 mg by mouth daily.     tiZANidine (ZANAFLEX) 2 MG tablet Take 2 mg by mouth 2 (two) times daily.     predniSONE (DELTASONE) 10 MG tablet Take 4 tabs daily for 3 days, then take 3 tabs daily for 3 days, then take 2 tabs daily for 3 days, then take 1 tab daily for 3 days, then restart prednisone 5 mg daily as before. (Patient not taking: Reported on 10/09/2022) 30 tablet 0   No facility-administered medications prior to visit.    Review of Systems  Review of Systems  Constitutional: Negative.   HENT: Negative.    Respiratory: Negative.     Physical Exam  BP 98/62 (BP Location: Right Arm, Cuff Size: Normal)   Pulse 71   Temp (!) 97.5 F (36.4 C) (Temporal)   Ht 5' (1.524 m)   Wt 103 lb 3.2 oz (46.8 kg)   SpO2 96%   BMI 20.15 kg/m  Physical Exam Constitutional:      Appearance: Normal appearance.  HENT:     Head: Normocephalic and  atraumatic.  Cardiovascular:     Rate and Rhythm: Normal rate.  Pulmonary:     Effort: Pulmonary effort is normal.     Breath sounds: No  wheezing.     Comments: Rhonchi right base; O2 97% 3L  Neurological:     Mental Status: She is alert.      Lab Results:  CBC    Component Value Date/Time   WBC 10.0 08/28/2022 0353   RBC 3.32 (L) 08/28/2022 0353   HGB 10.3 (L) 08/28/2022 0353   HGB 9.7 (L) 04/09/2022 1037   HGB 11.1 09/30/2019 1151   HCT 33.0 (L) 08/28/2022 0353   HCT 33.4 (L) 09/30/2019 1151   PLT 356 08/28/2022 0353   PLT 265 04/09/2022 1037   PLT 333 09/30/2019 1151   MCV 99.4 08/28/2022 0353   MCV 99 (H) 09/30/2019 1151   MCH 31.0 08/28/2022 0353   MCHC 31.2 08/28/2022 0353   RDW 13.1 08/28/2022 0353   RDW 12.3 09/30/2019 1151   LYMPHSABS 1.0 08/28/2022 0353   LYMPHSABS 0.6 (L) 10/03/2015 1512   MONOABS 1.3 (H) 08/28/2022 0353   EOSABS 0.6 (H) 08/28/2022 0353   EOSABS 0.2 10/03/2015 1512   BASOSABS 0.1 08/28/2022 0353   BASOSABS 0.0 10/03/2015 1512    BMET    Component Value Date/Time   NA 139 08/28/2022 0353   NA 132 (A) 11/06/2021 0000   K 4.4 08/28/2022 0353   CL 99 08/28/2022 0353   CO2 31 08/28/2022 0353   GLUCOSE 109 (H) 08/28/2022 0353   BUN 22 08/28/2022 0353   BUN 16 11/06/2021 0000   CREATININE 0.76 08/28/2022 0353   CREATININE 0.68 04/09/2022 1037   CALCIUM 9.0 08/28/2022 0353   GFRNONAA >60 08/28/2022 0353   GFRNONAA >60 04/09/2022 1037   GFRAA 75 12/24/2018 1214    BNP    Component Value Date/Time   BNP 212.4 (H) 08/25/2022 1524    ProBNP No results found for: "PROBNP"  Imaging: DG Chest 2 View  Result Date: 10/20/2022 CLINICAL DATA:  Follow-up pneumonia. EXAM: CHEST - 2 VIEW COMPARISON:  08/25/2022. FINDINGS: The heart size and mediastinal contours are within normal limits. There is atherosclerotic calcification of the aorta. Mild residual airspace disease is noted at the lung bases bilaterally. There is blunting of the  costophrenic angles bilaterally. No pneumothorax. No acute osseous abnormality. IMPRESSION: 1. Stable mild airspace disease at the lung bases. 2. Blunting of the costophrenic angles, possible small bilateral pleural effusions and/or atelectasis or infiltrate. Electronically Signed   By: Thornell Sartorius M.D.   On: 10/20/2022 04:30     Assessment & Plan:   COPD (chronic obstructive pulmonary disease) (HCC) - Stable; Continue Breztri Aerosphere two puffs twice daily, prn albuterol hfa and prednisone 5mg  daily   Chronic respiratory failure with hypoxia (HCC) - Patient reports O2 drops into 70s occasionally on 3L.Marland Kitchen She has no acute respiratory complaint. We will check CXR due to rhonchi hear on exam. O2 97% 3L today. Ok to increase oxygen to 4 or 5 L if O2 <88%    Recommendations: - Breztri 2 puffs twice daily - Continue 5 mg prednisone daily - Use albuterol HFA 2 puffs every 4-6 hours as needed for breakthrough shortness of  breath or wheezing OR nebulizer solution  - Continue 3L oxygen, ok to increase oxygen to 4 or 5 L if O2 <88%  - Call Dr. Sharol Roussel office to follow-up on testing done [phone]  Rx: -  Albuterol neb solution (PHARMACY- [place])  Orders: - CXR today   Follow-up: - 3 months with Dr. Tonia Brooms or sooner if needed  Glenford Bayley, NP 11/03/2022

## 2022-10-09 NOTE — Patient Instructions (Addendum)
Recommendations: - Breztri 2 puffs twice daily - Continue 5 mg prednisone daily - Use albuterol HFA 2 puffs every 4-6 hours as needed for breakthrough shortness of  breath or wheezing OR nebulizer solution  - Continue 3L oxygen, ok to increase oxygen to 4 or 5 L if O2 <88%  - Call Dr. Sharol Roussel office to follow-up on testing done [phone]  Rx: -  Albuterol neb solution (PHARMACY- [place])  Orders: - CXR today   Follow-up: - 3 months with Dr. Tonia Brooms or sooner if needed

## 2022-10-15 ENCOUNTER — Inpatient Hospital Stay: Payer: Medicare PPO | Admitting: Pulmonary Disease

## 2022-10-24 ENCOUNTER — Other Ambulatory Visit: Payer: Self-pay

## 2022-11-03 NOTE — Assessment & Plan Note (Addendum)
-   Patient reports O2 drops into 70s occasionally on 3L.Desiree [name] She has no acute respiratory complaint. We will check CXR due to rhonchi hear on exam. O2 97% 3L today. Ok to increase oxygen to 4 or 5 L if O2 <88%

## 2022-11-03 NOTE — Assessment & Plan Note (Signed)
-   Stable; Continue Breztri Aerosphere two puffs twice daily, prn albuterol hfa and prednisone 5mg  daily

## 2022-11-04 ENCOUNTER — Other Ambulatory Visit (HOSPITAL_COMMUNITY): Payer: Self-pay

## 2022-11-05 ENCOUNTER — Emergency Department (HOSPITAL_COMMUNITY): Payer: Medicare PPO

## 2022-11-05 ENCOUNTER — Encounter (HOSPITAL_COMMUNITY): Payer: Self-pay

## 2022-11-05 ENCOUNTER — Inpatient Hospital Stay (HOSPITAL_COMMUNITY)
Admission: EM | Admit: 2022-11-05 | Discharge: 2022-11-11 | DRG: 871 | Disposition: A | Discharge status: Skilled Nursing Facility | Payer: Medicare PPO | Attending: Internal Medicine | Admitting: Internal Medicine

## 2022-11-05 DIAGNOSIS — E1140 Type 2 diabetes mellitus with diabetic neuropathy, unspecified: Secondary | ICD-10-CM | POA: Diagnosis present

## 2022-11-05 DIAGNOSIS — G2581 Restless legs syndrome: Secondary | ICD-10-CM | POA: Diagnosis present

## 2022-11-05 DIAGNOSIS — Z85118 Personal history of other malignant neoplasm of bronchus and lung: Secondary | ICD-10-CM

## 2022-11-05 DIAGNOSIS — Z8701 Personal history of pneumonia (recurrent): Secondary | ICD-10-CM

## 2022-11-05 DIAGNOSIS — Z86711 Personal history of pulmonary embolism: Secondary | ICD-10-CM

## 2022-11-05 DIAGNOSIS — Z66 Do not resuscitate: Secondary | ICD-10-CM | POA: Diagnosis present

## 2022-11-05 DIAGNOSIS — I10 Essential (primary) hypertension: Secondary | ICD-10-CM | POA: Diagnosis present

## 2022-11-05 DIAGNOSIS — E1165 Type 2 diabetes mellitus with hyperglycemia: Secondary | ICD-10-CM | POA: Diagnosis present

## 2022-11-05 DIAGNOSIS — E1122 Type 2 diabetes mellitus with diabetic chronic kidney disease: Secondary | ICD-10-CM | POA: Diagnosis present

## 2022-11-05 DIAGNOSIS — J440 Chronic obstructive pulmonary disease with acute lower respiratory infection: Secondary | ICD-10-CM | POA: Diagnosis present

## 2022-11-05 DIAGNOSIS — I480 Paroxysmal atrial fibrillation: Secondary | ICD-10-CM | POA: Diagnosis present

## 2022-11-05 DIAGNOSIS — G8929 Other chronic pain: Secondary | ICD-10-CM | POA: Diagnosis present

## 2022-11-05 DIAGNOSIS — F1020 Alcohol dependence, uncomplicated: Secondary | ICD-10-CM | POA: Diagnosis present

## 2022-11-05 DIAGNOSIS — T380X5A Adverse effect of glucocorticoids and synthetic analogues, initial encounter: Secondary | ICD-10-CM | POA: Diagnosis present

## 2022-11-05 DIAGNOSIS — E039 Hypothyroidism, unspecified: Secondary | ICD-10-CM | POA: Diagnosis present

## 2022-11-05 DIAGNOSIS — A419 Sepsis, unspecified organism: Principal | ICD-10-CM | POA: Diagnosis present

## 2022-11-05 DIAGNOSIS — R54 Age-related physical debility: Secondary | ICD-10-CM | POA: Diagnosis present

## 2022-11-05 DIAGNOSIS — E782 Mixed hyperlipidemia: Secondary | ICD-10-CM | POA: Diagnosis present

## 2022-11-05 DIAGNOSIS — R6521 Severe sepsis with septic shock: Secondary | ICD-10-CM | POA: Diagnosis present

## 2022-11-05 DIAGNOSIS — N183 Chronic kidney disease, stage 3 unspecified: Secondary | ICD-10-CM | POA: Diagnosis present

## 2022-11-05 DIAGNOSIS — J441 Chronic obstructive pulmonary disease with (acute) exacerbation: Secondary | ICD-10-CM | POA: Diagnosis present

## 2022-11-05 DIAGNOSIS — Z7982 Long term (current) use of aspirin: Secondary | ICD-10-CM

## 2022-11-05 DIAGNOSIS — N170 Acute kidney failure with tubular necrosis: Secondary | ICD-10-CM | POA: Diagnosis present

## 2022-11-05 DIAGNOSIS — I482 Chronic atrial fibrillation, unspecified: Secondary | ICD-10-CM | POA: Diagnosis present

## 2022-11-05 DIAGNOSIS — K7469 Other cirrhosis of liver: Secondary | ICD-10-CM | POA: Diagnosis present

## 2022-11-05 DIAGNOSIS — E119 Type 2 diabetes mellitus without complications: Secondary | ICD-10-CM

## 2022-11-05 DIAGNOSIS — J9621 Acute and chronic respiratory failure with hypoxia: Secondary | ICD-10-CM | POA: Diagnosis present

## 2022-11-05 DIAGNOSIS — Z79899 Other long term (current) drug therapy: Secondary | ICD-10-CM

## 2022-11-05 DIAGNOSIS — I130 Hypertensive heart and chronic kidney disease with heart failure and stage 1 through stage 4 chronic kidney disease, or unspecified chronic kidney disease: Secondary | ICD-10-CM | POA: Diagnosis present

## 2022-11-05 DIAGNOSIS — Z823 Family history of stroke: Secondary | ICD-10-CM

## 2022-11-05 DIAGNOSIS — M069 Rheumatoid arthritis, unspecified: Secondary | ICD-10-CM | POA: Diagnosis present

## 2022-11-05 DIAGNOSIS — Z7952 Long term (current) use of systemic steroids: Secondary | ICD-10-CM

## 2022-11-05 DIAGNOSIS — Z885 Allergy status to narcotic agent status: Secondary | ICD-10-CM

## 2022-11-05 DIAGNOSIS — Z832 Family history of diseases of the blood and blood-forming organs and certain disorders involving the immune mechanism: Secondary | ICD-10-CM

## 2022-11-05 DIAGNOSIS — J449 Chronic obstructive pulmonary disease, unspecified: Secondary | ICD-10-CM | POA: Diagnosis present

## 2022-11-05 DIAGNOSIS — J432 Centrilobular emphysema: Secondary | ICD-10-CM | POA: Diagnosis present

## 2022-11-05 DIAGNOSIS — Z8249 Family history of ischemic heart disease and other diseases of the circulatory system: Secondary | ICD-10-CM

## 2022-11-05 DIAGNOSIS — C182 Malignant neoplasm of ascending colon: Secondary | ICD-10-CM | POA: Diagnosis present

## 2022-11-05 DIAGNOSIS — Z825 Family history of asthma and other chronic lower respiratory diseases: Secondary | ICD-10-CM

## 2022-11-05 DIAGNOSIS — L89616 Pressure-induced deep tissue damage of right heel: Secondary | ICD-10-CM | POA: Diagnosis present

## 2022-11-05 DIAGNOSIS — I5042 Chronic combined systolic (congestive) and diastolic (congestive) heart failure: Secondary | ICD-10-CM | POA: Diagnosis present

## 2022-11-05 DIAGNOSIS — Z85038 Personal history of other malignant neoplasm of large intestine: Secondary | ICD-10-CM

## 2022-11-05 DIAGNOSIS — R0603 Acute respiratory distress: Secondary | ICD-10-CM | POA: Diagnosis present

## 2022-11-05 DIAGNOSIS — C3430 Malignant neoplasm of lower lobe, unspecified bronchus or lung: Secondary | ICD-10-CM | POA: Diagnosis present

## 2022-11-05 DIAGNOSIS — Z1152 Encounter for screening for COVID-19: Secondary | ICD-10-CM

## 2022-11-05 DIAGNOSIS — J189 Pneumonia, unspecified organism: Secondary | ICD-10-CM | POA: Diagnosis present

## 2022-11-05 DIAGNOSIS — I2510 Atherosclerotic heart disease of native coronary artery without angina pectoris: Secondary | ICD-10-CM | POA: Diagnosis present

## 2022-11-05 DIAGNOSIS — Z923 Personal history of irradiation: Secondary | ICD-10-CM

## 2022-11-05 DIAGNOSIS — Z87891 Personal history of nicotine dependence: Secondary | ICD-10-CM

## 2022-11-05 HISTORY — DX: Sepsis, unspecified organism: A41.9

## 2022-11-05 LAB — I-STAT ARTERIAL BLOOD GAS, ED
Acid-Base Excess: 3 mmol/L — ABNORMAL HIGH (ref 0.0–2.0)
Bicarbonate: 30 mmol/L — ABNORMAL HIGH (ref 20.0–28.0)
Calcium, Ion: 1 mmol/L — ABNORMAL LOW (ref 1.15–1.40)
HCT: 27 % — ABNORMAL LOW (ref 36.0–46.0)
Hemoglobin: 9.2 g/dL — ABNORMAL LOW (ref 12.0–15.0)
O2 Saturation: 96 %
Patient temperature: 99.3
Potassium: 4 mmol/L (ref 3.5–5.1)
Sodium: 138 mmol/L (ref 135–145)
TCO2: 32 mmol/L (ref 22–32)
pCO2 arterial: 62 mm[Hg] — ABNORMAL HIGH (ref 32–48)
pH, Arterial: 7.294 — ABNORMAL LOW (ref 7.35–7.45)
pO2, Arterial: 93 mm[Hg] (ref 83–108)

## 2022-11-05 LAB — I-STAT CG4 LACTIC ACID, ED: Lactic Acid, Venous: 2.3 mmol/L (ref 0.5–1.9)

## 2022-11-05 LAB — URINALYSIS, W/ REFLEX TO CULTURE (INFECTION SUSPECTED)
Bilirubin Urine: NEGATIVE
Glucose, UA: NEGATIVE mg/dL
Hgb urine dipstick: NEGATIVE
Ketones, ur: NEGATIVE mg/dL
Leukocytes,Ua: NEGATIVE
Nitrite: NEGATIVE
Protein, ur: 100 mg/dL — AB
Specific Gravity, Urine: 1.012 (ref 1.005–1.030)
pH: 6 (ref 5.0–8.0)

## 2022-11-05 LAB — MRSA NEXT GEN BY PCR, NASAL: MRSA by PCR Next Gen: NOT DETECTED

## 2022-11-05 LAB — GLUCOSE, CAPILLARY
Glucose-Capillary: 104 mg/dL — ABNORMAL HIGH (ref 70–99)
Glucose-Capillary: 111 mg/dL — ABNORMAL HIGH (ref 70–99)
Glucose-Capillary: 122 mg/dL — ABNORMAL HIGH (ref 70–99)

## 2022-11-05 LAB — COMPREHENSIVE METABOLIC PANEL
ALT: 13 U/L (ref 0–44)
AST: 28 U/L (ref 15–41)
Albumin: 3.1 g/dL — ABNORMAL LOW (ref 3.5–5.0)
Alkaline Phosphatase: 107 U/L (ref 38–126)
Anion gap: 9 (ref 5–15)
BUN: 11 mg/dL (ref 8–23)
CO2: 29 mmol/L (ref 22–32)
Calcium: 9.2 mg/dL (ref 8.9–10.3)
Chloride: 100 mmol/L (ref 98–111)
Creatinine, Ser: 1.07 mg/dL — ABNORMAL HIGH (ref 0.44–1.00)
GFR, Estimated: 53 mL/min — ABNORMAL LOW (ref >60)
Glucose, Bld: 80 mg/dL (ref 70–99)
Potassium: 3.7 mmol/L (ref 3.5–5.1)
Sodium: 138 mmol/L (ref 135–145)
Total Bilirubin: 0.5 mg/dL (ref 0.3–1.2)
Total Protein: 6.7 g/dL (ref 6.5–8.1)

## 2022-11-05 LAB — CBC WITH DIFFERENTIAL/PLATELET
Abs Immature Granulocytes: 0.1 10*3/uL — ABNORMAL HIGH (ref 0.00–0.07)
Basophils Absolute: 0.1 10*3/uL (ref 0.0–0.1)
Basophils Relative: 1 %
Eosinophils Absolute: 0.2 10*3/uL (ref 0.0–0.5)
Eosinophils Relative: 2 %
HCT: 36.1 % (ref 36.0–46.0)
Hemoglobin: 10.9 g/dL — ABNORMAL LOW (ref 12.0–15.0)
Immature Granulocytes: 1 %
Lymphocytes Relative: 3 %
Lymphs Abs: 0.4 10*3/uL — ABNORMAL LOW (ref 0.7–4.0)
MCH: 31.3 pg (ref 26.0–34.0)
MCHC: 30.2 g/dL (ref 30.0–36.0)
MCV: 103.7 fL — ABNORMAL HIGH (ref 80.0–100.0)
Monocytes Absolute: 0.2 10*3/uL (ref 0.1–1.0)
Monocytes Relative: 2 %
Neutro Abs: 10.9 10*3/uL — ABNORMAL HIGH (ref 1.7–7.7)
Neutrophils Relative %: 91 %
Platelets: 333 10*3/uL (ref 150–400)
RBC: 3.48 MIL/uL — ABNORMAL LOW (ref 3.87–5.11)
RDW: 14.1 % (ref 11.5–15.5)
WBC: 11.9 10*3/uL — ABNORMAL HIGH (ref 4.0–10.5)
nRBC: 0 % (ref 0.0–0.2)

## 2022-11-05 LAB — RESPIRATORY PANEL BY PCR

## 2022-11-05 LAB — RESP PANEL BY RT-PCR (RSV, FLU A&B, COVID)  RVPGX2
Influenza A by PCR: NEGATIVE
Influenza B by PCR: NEGATIVE
Resp Syncytial Virus by PCR: NEGATIVE
SARS Coronavirus 2 by RT PCR: NEGATIVE

## 2022-11-05 LAB — PROCALCITONIN: Procalcitonin: 56.21 ng/mL

## 2022-11-05 LAB — PROTIME-INR
INR: 1.1 (ref 0.8–1.2)
Prothrombin Time: 14 s (ref 11.4–15.2)

## 2022-11-05 LAB — CBG MONITORING, ED: Glucose-Capillary: 86 mg/dL (ref 70–99)

## 2022-11-05 LAB — APTT: aPTT: 28 s (ref 24–36)

## 2022-11-05 LAB — LACTIC ACID, PLASMA
Lactic Acid, Venous: 1.3 mmol/L (ref 0.5–1.9)
Lactic Acid, Venous: 1.2 mmol/L (ref 0.5–1.9)

## 2022-11-05 LAB — BRAIN NATRIURETIC PEPTIDE: B Natriuretic Peptide: 724.4 pg/mL — ABNORMAL HIGH (ref 0.0–100.0)

## 2022-11-05 MED ORDER — METHYLPREDNISOLONE SODIUM SUCC 125 MG IJ SOLR
125.0000 mg | Freq: Once | INTRAMUSCULAR | Status: AC
  Administered 2022-11-05: 125 mg via INTRAVENOUS
  Filled 2022-11-05: qty 2

## 2022-11-05 MED ORDER — SODIUM CHLORIDE 0.9 % IV SOLN
250.0000 mL | INTRAVENOUS | Status: DC
  Administered 2022-11-05: 250 mL via INTRAVENOUS

## 2022-11-05 MED ORDER — CHLORHEXIDINE GLUCONATE CLOTH 2 % EX PADS
6.0000 | MEDICATED_PAD | Freq: Every day | CUTANEOUS | Status: DC
  Administered 2022-11-06 – 2022-11-09 (×4): 6 via TOPICAL

## 2022-11-05 MED ORDER — IPRATROPIUM-ALBUTEROL 0.5-2.5 (3) MG/3ML IN SOLN
3.0000 mL | Freq: Four times a day (QID) | RESPIRATORY_TRACT | Status: DC
  Administered 2022-11-05 – 2022-11-06 (×3): 3 mL via RESPIRATORY_TRACT
  Filled 2022-11-05 (×4): qty 3

## 2022-11-05 MED ORDER — SODIUM CHLORIDE 0.9 % IV SOLN
500.0000 mg | INTRAVENOUS | Status: DC
  Administered 2022-11-05 – 2022-11-08 (×4): 500 mg via INTRAVENOUS
  Filled 2022-11-05 (×5): qty 5

## 2022-11-05 MED ORDER — SODIUM CHLORIDE 0.9 % IV SOLN
2.0000 g | INTRAVENOUS | Status: DC
  Administered 2022-11-05 – 2022-11-08 (×4): 2 g via INTRAVENOUS
  Filled 2022-11-05 (×4): qty 20

## 2022-11-05 MED ORDER — METHYLPREDNISOLONE SODIUM SUCC 40 MG IJ SOLR
40.0000 mg | Freq: Two times a day (BID) | INTRAMUSCULAR | Status: DC
  Administered 2022-11-05 – 2022-11-09 (×8): 40 mg via INTRAVENOUS
  Filled 2022-11-05 (×8): qty 1

## 2022-11-05 MED ORDER — ACETAMINOPHEN 650 MG RE SUPP
650.0000 mg | Freq: Once | RECTAL | Status: AC
  Administered 2022-11-05: 650 mg via RECTAL
  Filled 2022-11-05: qty 1

## 2022-11-05 MED ORDER — ARFORMOTEROL TARTRATE 15 MCG/2ML IN NEBU
15.0000 ug | INHALATION_SOLUTION | Freq: Two times a day (BID) | RESPIRATORY_TRACT | Status: DC
  Administered 2022-11-05 – 2022-11-11 (×13): 15 ug via RESPIRATORY_TRACT
  Filled 2022-11-05 (×13): qty 2

## 2022-11-05 MED ORDER — LACTATED RINGERS IV BOLUS (SEPSIS)
1000.0000 mL | Freq: Once | INTRAVENOUS | Status: AC
  Administered 2022-11-05: 1000 mL via INTRAVENOUS

## 2022-11-05 MED ORDER — INSULIN ASPART 100 UNIT/ML IJ SOLN
0.0000 [IU] | INTRAMUSCULAR | Status: DC
  Administered 2022-11-05 – 2022-11-07 (×6): 1 [IU] via SUBCUTANEOUS
  Administered 2022-11-07: 2 [IU] via SUBCUTANEOUS

## 2022-11-05 MED ORDER — METHYLPREDNISOLONE SODIUM SUCC 125 MG IJ SOLR
125.0000 mg | Freq: Two times a day (BID) | INTRAMUSCULAR | Status: DC
Start: 2022-11-05 — End: 2022-11-05

## 2022-11-05 MED ORDER — REVEFENACIN 175 MCG/3ML IN SOLN
175.0000 ug | Freq: Every day | RESPIRATORY_TRACT | Status: DC
  Administered 2022-11-05 – 2022-11-11 (×7): 175 ug via RESPIRATORY_TRACT
  Filled 2022-11-05 (×7): qty 3

## 2022-11-05 MED ORDER — ORAL CARE MOUTH RINSE: 15.0000 mL | OROMUCOSAL | Status: DC | PRN

## 2022-11-05 MED ORDER — NOREPINEPHRINE 4 MG/250ML-% IV SOLN
0.0000 ug/min | INTRAVENOUS | Status: DC
  Administered 2022-11-05: 2 ug/min via INTRAVENOUS
  Filled 2022-11-05: qty 250

## 2022-11-05 MED ORDER — LACTATED RINGERS IV BOLUS (SEPSIS)
500.0000 mL | Freq: Once | INTRAVENOUS | Status: AC
  Administered 2022-11-05: 500 mL via INTRAVENOUS

## 2022-11-05 MED ORDER — HEPARIN SODIUM (PORCINE) 5000 UNIT/ML IJ SOLN
5000.0000 [IU] | Freq: Three times a day (TID) | INTRAMUSCULAR | Status: DC
  Administered 2022-11-05 – 2022-11-11 (×18): 5000 [IU] via SUBCUTANEOUS
  Filled 2022-11-05 (×17): qty 1

## 2022-11-05 MED ORDER — ORAL CARE MOUTH RINSE
15.0000 mL | OROMUCOSAL | Status: DC
  Administered 2022-11-05 – 2022-11-11 (×21): 15 mL via OROMUCOSAL

## 2022-11-05 MED ORDER — PANTOPRAZOLE SODIUM 40 MG IV SOLR
40.0000 mg | INTRAVENOUS | Status: DC
  Administered 2022-11-05 – 2022-11-07 (×3): 40 mg via INTRAVENOUS
  Filled 2022-11-05 (×3): qty 10

## 2022-11-05 MED ORDER — BUDESONIDE 0.25 MG/2ML IN SUSP
0.2500 mg | Freq: Two times a day (BID) | RESPIRATORY_TRACT | Status: DC
  Administered 2022-11-05 – 2022-11-11 (×13): 0.25 mg via RESPIRATORY_TRACT
  Filled 2022-11-05 (×13): qty 2

## 2022-11-05 MED ORDER — LACTATED RINGERS IV SOLN: INTRAVENOUS | Status: DC

## 2022-11-05 MED ORDER — IPRATROPIUM-ALBUTEROL 0.5-2.5 (3) MG/3ML IN SOLN: 3.0000 mL | Freq: Four times a day (QID) | RESPIRATORY_TRACT | Status: DC | PRN

## 2022-11-05 MED ORDER — NOREPINEPHRINE 4 MG/250ML-% IV SOLN: 2.0000 ug/min | INTRAVENOUS | Status: DC

## 2022-11-05 MED ORDER — POLYETHYLENE GLYCOL 3350 17 G PO PACK: 17.0000 g | PACK | Freq: Every day | ORAL | Status: DC | PRN

## 2022-11-05 MED ORDER — DOCUSATE SODIUM 100 MG PO CAPS
100.0000 mg | ORAL_CAPSULE | Freq: Two times a day (BID) | ORAL | Status: DC | PRN
  Filled 2022-11-05: qty 1

## 2022-11-05 NOTE — Progress Notes (Signed)
Afternoon rounds: Up to ICU. Off BiPAP, certainly more awake than the ED but still a little confused. She is conversational. Still on 4LNC satting well. On levo but looks to be weanable. Will continue to wean pressors. Con't antibiotics. BiPAP PRN and at bedtime. Family was called earlier and updated on status.

## 2022-11-05 NOTE — Progress Notes (Signed)
   11/05/22 2050  Vent Select  Invasive or Noninvasive Noninvasive  Adult Vent Y  Adult Ventilator Settings  Vent Type Servo i  Vent Mode BIPAP;PCV  Set Rate 18 bmp  FiO2 (%) 40 %  Pressure Control 13 cmH20  PEEP 5 cmH20  Adult Ventilator Measurements  Peak Airway Pressure 15 L/min  Resp Rate Spontaneous 2 br/min  Resp Rate Total 20 br/min  Exhaled Vt 376 mL  Measured Ve 8.1 L  I:E Ratio Measured 1:2.7  Auto PEEP 0 cmH20  Total PEEP 5 cmH20  Adult Ventilator Alarms  Alarms On Y  Ve High Alarm 23 L/min  Ve Low Alarm 5 L/min  Resp Rate High Alarm 43 br/min  Resp Rate Low Alarm 10  PEEP Low Alarm 3 cmH2O  Press High Alarm 25 cmH2O  VAP Prevention  HOB> 30 Degrees Y  Equipment wiped down Yes  Breath Sounds  Bilateral Breath Sounds Diminished;Expiratory wheezes   Placed pt on bipap

## 2022-11-05 NOTE — H&P (Signed)
NAME:  Desiree [name], MRN:  [id number], DOB:  [birth year], LOS: 0 ADMISSION DATE:  11/05/2022, CONSULTATION DATE:  11/05/2022  REFERRING MD:  Freida Busman, EDP, CHIEF COMPLAINT:  hypotension, sepsis   History of Present Illness:  77 year old female with past medical history of paroxysmal atrial fibrillation, alcoholism, CAD, colon cancer, emphysema, CKD III, HTN, lung cancer, RA, Type 2 DM who presents to the emergency department on 11/05/22 with complaint of shortness of breath. Has had several days of shortness of breath and arrived with O2 sat 80% on 5LNC. She uses 3LNC at baseline. Placed on CPAP with improvement. She was febrile to 103.3 with tachycardia. Labs with Cr 1.07, WBC 11.9, hgb 10.9, lactic 2.3. Blood cultures drawn. CXR showing bilateral interstitial opacities. Given 1.5L fluid resuscitation, rocephin/azithro for CAP coverage and started on low dose levophed for hypotension. Hospitalist was consulted and requested PCCM admit.   On my exam, patient on BiPAP, tired appearing. She does verbalize name, place, follows commands. She is unsure why she is here. Wheezing bilaterally. Crackles bilaterally.   Pertinent  Medical History  paroxysmal atrial fibrillation, alcoholism, CAD, colon cancer, emphysema, CKD III, HTN, lung cancer, RA, Type 2 DM  Significant Hospital Events: Including procedures, antibiotic start and stop dates in addition to other pertinent events   10/2: admitted to PCCM  Interim History / Subjective:  No complaints. Resting on BiPAP   Objective   Blood pressure (!) 71/58, pulse 99, temperature (!) 103.3 F (39.6 C), temperature source Rectal, resp. rate (!) 25, height 5' (1.524 m), weight 46.8 kg, SpO2 99%.    Vent Mode: BIPAP;PCV FiO2 (%):  [50 %] 50 % Set Rate:  [15 bmp] 15 bmp PEEP:  [5 cmH20] 5 cmH20  No intake or output data in the 24 hours ending 11/05/22 1244 Filed Weights   11/05/22 1036  Weight: 46.8 kg    Examination: General: elderly female, ill  appearing, on BiPAP HENT: mouth is dry, anicteric sclera, EOMI  Lungs: crackles and wheezing bilaterally, diminished sounds bilateral bases Cardiovascular: s1/s2 without murmur,rub, gallop Abdomen: rounded, soft, non-tender Extremities: no pitting edema, deconditioned  Neuro: opens eyes to voice, oriented to self, place.  GU: deferred   Resolved Hospital Problem list     Assessment & Plan:  Shock; likely septic shock 2/2 community acquired pneumonia;  CXR with bilateral infiltrates, increased O2 requirement, febrile. UA not completed yet. No intraabdominal complaints. IVF resuscitated with ~2L.  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 Can resume when stable - hold asa and statin while npo  T2DM - q4h BG and SSI if needed. Not on home antidiabetics   CKD stage III; baseline Cr ~0.8. today 1.07 - trend bmp  - avoid nephrotoxic agents   Paroxysmal afib  -not on ac due to hx of bleeding Plan: -tele monitoring -hold home BB w/ hypotension  RA  Plan: -steroids as above -hold home meds for now  Peripheral neuropathy RLS Plan: -hold home gabapentin, requip, remeron  Best Practice (right click and "Reselect all SmartList Selections" daily)   Diet/type: NPO DVT prophylaxis: prophylactic heparin  GI prophylaxis: PPI Lines: N/A Foley:  N/A Code Status:   limited Last date of multidisciplinary goals of care discussion [called family on 10/2 ]  Labs   CBC: Recent Labs  Lab 11/05/22 1059  WBC 11.9*  NEUTROABS 10.9*  HGB 10.9*  HCT 36.1  MCV 103.7*  PLT 333    Basic Metabolic Panel: Recent Labs  Lab 11/05/22 1059  NA 138  K 3.7  CL 100  CO2 29  GLUCOSE 80  BUN 11  CREATININE 1.07*  CALCIUM 9.2   GFR: Estimated Creatinine Clearance: 30.6 mL/min (A) (by C-G formula based on SCr of 1.07 mg/dL (H)). Recent Labs  Lab 11/05/22 1059 11/05/22 1108  WBC 11.9*  --   LATICACIDVEN  --  2.3*    Liver Function Tests: Recent Labs  Lab 11/05/22 1059  AST 28  ALT 13  ALKPHOS 107  BILITOT 0.5  PROT 6.7  ALBUMIN 3.1*   No results for input(s): "LIPASE", "AMYLASE" in the last 168 hours. No results for input(s): "AMMONIA" in the last 168 hours.  ABG    Component Value Date/Time   TCO2 33 (H) 08/20/2022 0020     Coagulation Profile: Recent Labs  Lab 11/05/22 1059  INR 1.1    Cardiac Enzymes: No results for input(s): "CKTOTAL", "CKMB", "CKMBINDEX", "TROPONINI" in the last 168 hours.  HbA1C: Hgb A1c MFr Bld  Date/Time Value Ref Range Status  08/20/2022 01:07 PM 4.9 4.8 - 5.6 % Final    Comment:    (NOTE)         Prediabetes: 5.7 - 6.4         Diabetes: >6.4         Glycemic control for adults with diabetes: <7.0     CBG: No results for input(s): "GLUCAP" in the last 168 hours.  Review of Systems:   As per HPI  Past Medical History:  She,  has a past medical history of Abnormal nuclear stress test, AF (paroxysmal atrial fibrillation) (HCC), Age-related osteoporosis without current pathological fracture (05/16/2016), Alcoholism (HCC) (10/21/2018), Allergic rhinitis (07/17/2015), At high risk for falls (07/14/2017), Atherosclerotic heart disease of native coronary artery without angina pectoris (04/09/2021), 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HCC) (07/14/2017), Benign paroxysmal positional vertigo due to bilateral vestibular  disorder (02/09/2020), Cancer of ascending colon (HCC), Carcinoma of ascending colon (HCC), Cat allergies, Centrilobular emphysema (HCC) (05/16/2016), Chronic anemia (04/09/2021), Chronic atrial fibrillation, unspecified (HCC) (10/04/2020), Chronic obstructive pulmonary disease (HCC) (04/09/2021), Chronic pain (04/09/2021), CKD (chronic kidney disease) stage 3, GFR 30-59 ml/min (HCC) (01/04/2018), COPD (chronic obstructive pulmonary disease) (HCC), Cysts of both ovaries (11/24/2019), Dyspnea, Early satiety (04/14/2019), Essential hypertension (08/22/2010), Gastro-esophageal reflux disease without esophagitis (07/14/2017), Gastrointestinal hemorrhage, History of pulmonary embolus (PE) (07/14/2017), History of radiation therapy, History of radiation therapy, Hypertension, Hypothyroidism (04/09/2021), Idiopathic peripheral neuropathy (02/23/2020), Iron deficiency anemia due to chronic blood loss (10/27/2018), 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 (02/27/2017), Lung cancer, lower lobe (HCC) (09/24/2020), Lung nodule, solitary (08/16/2020), Malaise and fatigue (07/14/2017), Malignant neoplasm of unspecified part of left bronchus or lung (HCC) (04/09/2021), Malignant tumor of ascending colon (HCC) (04/09/2021), Mild CAD (02/02/2019), Mild episode of recurrent major depressive disorder (HCC) (07/14/2017), Mixed hyperlipidemia (07/14/2017), Multiple lung nodules on CT (05/16/2016), Osteoporosis, Other cirrhosis of liver (HCC) (  11/24/2019), Other vitamin B12 deficiency anemias (04/09/2021), Paroxysmal atrial fibrillation (HCC) (04/14/2019), Prediabetes (07/14/2017), Pulmonary embolism (HCC) (2017), Rheumatoid arthritis (HCC) (09/20/2010), Rheumatoid arthritis(714.0), Schatzki's ring, Senile purpura (HCC) (01/04/2018), Skin rash (07/14/2017), Status post thoracentesis, Symptomatic anemia (07/25/2020), Type 2 diabetes mellitus without complications (HCC) (04/09/2021), Vitamin B12 deficiency (07/14/2017),  and Vitamin D deficiency (04/09/2021).   Surgical History:   Past Surgical History:  Procedure Laterality Date   BIOPSY  07/27/2020   Procedure: BIOPSY;  Surgeon: Iva Boop, MD;  Location: [facility] ENDOSCOPY;  Service: Endoscopy;;   BRONCHIAL BIOPSY  09/11/2020   Procedure: BRONCHIAL BIOPSIES;  Surgeon: Josephine Igo, DO;  Location: [facility] ENDOSCOPY;  Service: Pulmonary;;   BRONCHIAL BRUSHINGS  09/11/2020   Procedure: BRONCHIAL BRUSHINGS;  Surgeon: Josephine Igo, DO;  Location: [facility] ENDOSCOPY;  Service: Pulmonary;;   BRONCHIAL NEEDLE ASPIRATION BIOPSY  09/11/2020   Procedure: BRONCHIAL NEEDLE ASPIRATION BIOPSIES;  Surgeon: Josephine Igo, DO;  Location: [facility] ENDOSCOPY;  Service: Pulmonary;;   BRONCHIAL WASHINGS  09/11/2020   Procedure: BRONCHIAL WASHINGS;  Surgeon: Josephine Igo, DO;  Location: [facility] ENDOSCOPY;  Service: Pulmonary;;   COLONOSCOPY N/A 07/27/2020   Procedure: COLONOSCOPY;  Surgeon: Iva Boop, MD;  Location[facility] ENDOSCOPY;  Service: Endoscopy;  Laterality: N/A;   CYSTECTOMY Right    breast   ESOPHAGOGASTRODUODENOSCOPY (EGD) WITH PROPOFOL N/A 07/27/2020   Procedure: ESOPHAGOGASTRODUODENOSCOPY (EGD) WITH PROPOFOL;  Surgeon: Iva Boop, MD;  Location[facility] ENDOSCOPY;  Service: Endoscopy;  Laterality: N/A;   FIDUCIAL MARKER PLACEMENT  09/11/2020   Procedure: FIDUCIAL MARKER PLACEMENT;  Surgeon: Josephine Igo, DO;  Location: [facility] ENDOSCOPY;  Service: Pulmonary;;   LAPAROSCOPIC RIGHT HEMI COLECTOMY Right 07/31/2020   Procedure: LAPAROSCOPIC ASSISSTED  RIGHT HEMI COLECTOMY;  Surgeon: Manus Rudd, MD;  Location: [facility] OR;  Service: General;  Laterality: Right;   LEFT HEART CATH AND CORONARY ANGIOGRAPHY N/A 01/05/2019   Procedure: LEFT HEART CATH AND CORONARY ANGIOGRAPHY;  Surgeon: Corky Crafts, MD;  Location[facility] INVASIVE CV LAB;  Service: Cardiovascular;  Laterality: N/A;   SUBMUCOSAL TATTOO INJECTION  07/27/2020   Procedure: SUBMUCOSAL TATTOO INJECTION;  Surgeon: Iva Boop,  MD;  Location: [facility] ENDOSCOPY;  Service: Endoscopy;;   VIDEO BRONCHOSCOPY WITH ENDOBRONCHIAL NAVIGATION Left 09/11/2020   Procedure: VIDEO BRONCHOSCOPY WITH ENDOBRONCHIAL NAVIGATION;  Surgeon: Josephine Igo, DO;  Location: [facility] ENDOSCOPY;  Service: Pulmonary;  Laterality: Left;  ION   VIDEO BRONCHOSCOPY WITH RADIAL ENDOBRONCHIAL ULTRASOUND  09/11/2020   Procedure: RADIAL ENDOBRONCHIAL ULTRASOUND;  Surgeon: Josephine Igo, DO;  Location: [facility] ENDOSCOPY;  Service: Pulmonary;;     Social History:   reports that she quit smoking about 21 years ago. Her smoking use included cigarettes. She started smoking about 61 years ago. She has a 40 pack-year smoking history. She has never used smokeless tobacco. She reports current alcohol use of about 2.0 standard drinks of alcohol per week. She reports that she does not use drugs.   Family History:  Her family history includes Breast cancer in her maternal aunt; Clotting disorder in her mother; Emphysema in her paternal grandmother and sister; Heart disease in her father and sister; Sleep apnea in her sister; Stroke in her mother.   Allergies Allergies  Allergen Reactions   Other Shortness Of Breath    Cats   Oxycodone Other (See Comments)    hallucinations     Home Medications  Prior to Admission medications   Medication Sig Start Date End Date Taking? Authorizing Provider  acetaminophen (  TYLENOL) 325 MG tablet Take 325 mg by mouth every 6 (six) hours as needed for moderate pain or headache.   Yes [provider]  albuterol (VENTOLIN HFA) 108 (90 Base) MCG/ACT inhaler Inhale 2 puffs into the lungs every 4 (four) hours as needed for shortness of breath (shortness of breath (related to COPD)).   Yes [provider]  amLODipine (NORVASC) 5 MG tablet Take 1 tablet (5 mg total) by mouth daily. Patient needs appointment for further refills. 1 st attempt 10/02/20  Yes Tobb, Kardie, DO  aspirin EC 81 MG tablet Take 81 mg by mouth daily.   Yes  [provider]  atorvastatin (LIPITOR) 20 MG tablet Take 1 tablet (20 mg total) by mouth daily. Patient needs to keep appointment for further refills. 1 st attempt Patient taking differently: Take 20 mg by mouth every evening. Patient needs to keep appointment for further refills. 1 st attempt 10/05/20  Yes Tobb, Kardie, DO  Budeson-Glycopyrrol-Formoterol (BREZTRI AEROSPHERE) 160-9-4.8 MCG/ACT AERO Inhale 2 puffs into the lungs 2 (two) times daily. 01/19/20  Yes Icard, Bradley L, DO  carvedilol (COREG) 6.25 MG tablet Take 1 tablet (6.25 mg total) by mouth 2 (two) times daily with a meal. 08/29/22 11/05/22 Yes Dahal, Melina Schools, MD  cholecalciferol (VITAMIN D3) 25 MCG (1000 UNIT) tablet Take 1,000 Units by mouth in the morning and at bedtime.   Yes [provider]  Ferrous Sulfate (IRON) 325 (65 Fe) MG TABS Take 1 tablet by mouth daily. 10/08/21  Yes [provider]  fexofenadine (ALLEGRA) 180 MG tablet Take 180 mg by mouth daily.   Yes [provider]  gabapentin (NEURONTIN) 100 MG capsule Take 100 mg by mouth 2 (two) times daily. 10/01/21  Yes [provider]  hydroxychloroquine (PLAQUENIL) 200 MG tablet Take 200 mg by mouth daily. 09/17/15  Yes [provider]  leflunomide (ARAVA) 10 MG tablet Take 10 mg by mouth daily.   Yes [provider]  lidocaine (ASPERCREME LIDOCAINE) 4 % Place 1 patch onto the skin daily as needed (muscle pain).   Yes [provider]  loperamide (IMODIUM A-D) 2 MG tablet Take 4 mg by mouth as needed for diarrhea or loose stools (MAX 10 PILLS/24 HRS.).   Yes [provider]  meclizine (ANTIVERT) 12.5 MG tablet Take 12.5 mg by mouth 2 (two) times daily as needed for dizziness. 05/20/22  Yes [provider]  mirtazapine (REMERON) 7.5 MG tablet Take 7.5 mg by mouth at bedtime.   Yes [provider]  montelukast (SINGULAIR) 10 MG tablet Take 1 tablet (10 mg total) by mouth at bedtime. 10/21/20   Yes Icard, Rachel Bo, DO  omeprazole (PRILOSEC) 20 MG capsule Take 1 capsule (20 mg total) by mouth daily. 07/11/20  Yes Icard, Bradley L, DO  ondansetron (ZOFRAN-ODT) 4 MG disintegrating tablet Take 1 tablet (4 mg total) by mouth every 8 (eight) hours as needed for nausea or vomiting. 08/05/20  Yes Valetta Close, MD  OYSTERCAL-D 500-10 MG-MCG TABS Take 1 tablet by mouth daily. 03/06/22  Yes [provider]  predniSONE (DELTASONE) 5 MG tablet Take 1 tablet (5 mg total) by mouth daily with breakfast. 09/09/22  Yes Dahal, Melina Schools, MD  rOPINIRole (REQUIP) 0.5 MG tablet Take 0.5 mg by mouth at bedtime.   Yes [provider]  senna-docusate (SENOKOT-S) 8.6-50 MG tablet Take 1 tablet by mouth at bedtime. 08/29/22  Yes Dahal, Melina Schools, MD  Sertraline HCl 150 MG CAPS Take 1 capsule by  mouth daily. 04/09/22  Yes [provider]  tizanidine (ZANAFLEX) 2 MG capsule Take 2 mg by mouth 3 (three) times daily.   Yes [provider]  traMADol (ULTRAM) 50 MG tablet Take 1 tablet (50 mg total) by mouth 3 (three) times daily as needed for moderate pain (pain). Patient taking differently: Take 50 mg by mouth in the morning, at noon, and at bedtime. 06/18/22  Yes Burnadette Pop, MD  valsartan (DIOVAN) 320 MG tablet Take 320 mg by mouth in the morning.   Yes [provider]  vitamin B-12 (CYANOCOBALAMIN) 1000 MCG tablet Take 1,000 mcg by mouth daily.    Yes [provider]     Critical care time: 71    Karma Greaser [facility] 11/05/2022, 1:16 PM  Please see [email or web address] for pager details.  From 7A-7P if no response, please call [phone]. After hours, please call ELink [phone].

## 2022-11-05 NOTE — Sepsis Progress Note (Signed)
eLink is following this Code Sepsis. °

## 2022-11-05 NOTE — Plan of Care (Signed)
  Problem: Coping: Goal: Ability to adjust to condition or change in health will improve Outcome: Progressing   Problem: Metabolic: Goal: Ability to maintain appropriate glucose levels will improve Outcome: Progressing   Problem: Skin Integrity: Goal: Risk for impaired skin integrity will decrease Outcome: Progressing   Problem: Tissue Perfusion: Goal: Adequacy of tissue perfusion will improve Outcome: Progressing   Problem: Education: Goal: Knowledge of General Education information will improve Description: Including pain rating scale, medication(s)/side effects and non-pharmacologic comfort measures Outcome: Progressing   Problem: Clinical Measurements: Goal: Respiratory complications will improve Outcome: Progressing Goal: Cardiovascular complication will be avoided Outcome: Progressing   Problem: Coping: Goal: Level of anxiety will decrease Outcome: Progressing   Problem: Elimination: Goal: Will not experience complications related to bowel motility Outcome: Progressing Goal: Will not experience complications related to urinary retention Outcome: Progressing   Problem: Pain Managment: Goal: General experience of comfort will improve Outcome: Progressing   Problem: Safety: Goal: Ability to remain free from injury will improve Outcome: Progressing   Problem: Skin Integrity: Goal: Risk for impaired skin integrity will decrease Outcome: Progressing

## 2022-11-05 NOTE — ED Triage Notes (Signed)
Hx of lung cancer and 6 pneumonias this year. Last admission was 2 weeks ago for pneumonia. Lives in [place]. EMS reports 80% on 5LPM [place]. Uses 3LPM  at home. Switched to cpap and now feels better.   EMS VS:  Bp 144/90 HR 150 O2 99% on cpap

## 2022-11-05 NOTE — ED Notes (Signed)
Daughter is in [place] but phone number in chart is correct

## 2022-11-05 NOTE — ED Provider Notes (Signed)
[facility] AT [facility] Provider Note   CSN: [id number] Arrival date & time: 11/05/22  1032     History  Chief Complaint  Patient presents with   Respiratory Distress    Desiree [name] is a 77 y.o. female.  77 year old female presents from facility with shortness of breath.  According to EMS, who I spoke with, patient has been short of breath for several days.  Has a history of pneumonia multiple times.  Also has history of lung cancer.  When they arrived, patient's pulse ox was 80% on 5 L.  Patient uses 3 L of O2 at baseline.  Was placed on CPAP and her respiratory status greatly improved.  Patient has a valid out of facility DNR.  Patient found to be tachycardic.  Transported here for further management       Home Medications Prior to Admission medications   Medication Sig Start Date End Date Taking? Authorizing Provider  acetaminophen (TYLENOL) 325 MG tablet Take 325 mg by mouth every 6 (six) hours as needed for moderate pain or headache.    [provider]  albuterol (PROVENTIL) (2.5 MG/3ML) 0.083% nebulizer solution Take 3 mLs (2.5 mg total) by nebulization every 6 (six) hours as needed for wheezing or shortness of breath. 10/09/22   Glenford Bayley, NP  albuterol (VENTOLIN HFA) 108 (90 Base) MCG/ACT inhaler Inhale 2 puffs into the lungs every 4 (four) hours as needed for shortness of breath (shortness of breath (related to COPD)).    [provider]  amLODipine (NORVASC) 5 MG tablet Take 1 tablet (5 mg total) by mouth daily. Patient needs appointment for further refills. 1 st attempt 10/02/20   Tobb, Kardie, DO  aspirin EC 81 MG tablet Take 81 mg by mouth daily.    [provider]  atorvastatin (LIPITOR) 20 MG tablet Take 1 tablet (20 mg total) by mouth daily. Patient needs to keep appointment for further refills. 1 st attempt Patient taking differently: Take 20 mg by mouth every evening. Patient needs to keep  appointment for further refills. 1 st attempt 10/05/20   Tobb, Lavona Mound, DO  Budeson-Glycopyrrol-Formoterol (BREZTRI AEROSPHERE) 160-9-4.8 MCG/ACT AERO Inhale 2 puffs into the lungs 2 (two) times daily. 01/19/20   Icard, Rachel Bo, DO  calcium-vitamin D (OSCAL WITH D) 500-5 MG-MCG tablet Take 1 tablet by mouth.    [provider]  carvedilol (COREG) 6.25 MG tablet Take 1 tablet (6.25 mg total) by mouth 2 (two) times daily with a meal. 08/29/22 10/09/22  Dahal, Melina Schools, MD  cholecalciferol (VITAMIN D3) 10 MCG (400 UNIT) TABS tablet Take 400 Units by mouth.    [provider]  Ferrous Sulfate (IRON) 325 (65 Fe) MG TABS Take 1 tablet by mouth daily. 10/08/21   [provider]  fexofenadine (ALLEGRA) 180 MG tablet Take 180 mg by mouth daily.    [provider]  gabapentin (NEURONTIN) 100 MG capsule Take 100 mg by mouth 2 (two) times daily. 10/01/21   [provider]  hydroxychloroquine (PLAQUENIL) 200 MG tablet Take 200 mg by mouth daily. 09/17/15   [provider]  leflunomide (ARAVA) 10 MG tablet Take 10 mg by mouth daily.    [provider]  lidocaine (ASPERCREME LIDOCAINE) 4 % Place 1 patch onto the skin daily as needed (muscle pain).    [provider]  loperamide (IMODIUM A-D) 2 MG tablet Take 2 mg by mouth as needed for diarrhea or loose stools (MAX 10  PILLS/24 HRS.).    [provider]  meclizine (ANTIVERT) 12.5 MG tablet Take 12.5 mg by mouth 2 (two) times daily as needed for dizziness. 05/20/22   [provider]  mirtazapine (REMERON) 7.5 MG tablet Take 7.5 mg by mouth at bedtime.    [provider]  montelukast (SINGULAIR) 10 MG tablet Take 1 tablet (10 mg total) by mouth at bedtime. 10/21/20   Icard, Rachel Bo, DO  nitrofurantoin, macrocrystal-monohydrate, (MACROBID) 100 MG capsule Take 100 mg by mouth 2 (two) times daily. 10/23/22   [provider]  omeprazole (PRILOSEC) 20 MG capsule Take 1 capsule  (20 mg total) by mouth daily. 07/11/20   Icard, Rachel Bo, DO  ondansetron (ZOFRAN-ODT) 4 MG disintegrating tablet Take 1 tablet (4 mg total) by mouth every 8 (eight) hours as needed for nausea or vomiting. 08/05/20   Valetta Close, MD  OYSTERCAL-D 500-10 MG-MCG TABS Take 1 tablet by mouth daily. 03/06/22   [provider]  predniSONE (DELTASONE) 10 MG tablet Take 4 tabs daily for 3 days, then take 3 tabs daily for 3 days, then take 2 tabs daily for 3 days, then take 1 tab daily for 3 days, then restart prednisone 5 mg daily as before. Patient not taking: Reported on 10/09/2022 08/29/22   Lorin Glass, MD  predniSONE (DELTASONE) 5 MG tablet Take 1 tablet (5 mg total) by mouth daily with breakfast. 09/09/22   Dahal, Melina Schools, MD  rOPINIRole (REQUIP) 0.5 MG tablet Take 0.5 mg by mouth at bedtime.    [provider]  senna-docusate (SENOKOT-S) 8.6-50 MG tablet Take 1 tablet by mouth at bedtime. 08/29/22   Lorin Glass, MD  Sertraline HCl 150 MG CAPS Take 1 capsule by mouth daily. 04/09/22   [provider]  tizanidine (ZANAFLEX) 2 MG capsule Take 2 mg by mouth 3 (three) times daily.    [provider]  traMADol (ULTRAM) 50 MG tablet Take 1 tablet (50 mg total) by mouth 3 (three) times daily as needed for moderate pain (pain). 06/18/22   Burnadette Pop, MD  valsartan (DIOVAN) 320 MG tablet Take 320 mg by mouth in the morning.    [provider]  vitamin B-12 (CYANOCOBALAMIN) 1000 MCG tablet Take 1,000 mcg by mouth daily.     [provider]      Allergies    Other and Oxycodone    Review of Systems   Review of Systems  Unable to perform ROS: Acuity of condition    Physical Exam Updated Vital Signs BP 130/72   Pulse (!) 135   Temp (!) 103.3 F (39.6 C) (Rectal)   Resp 19   Ht 1.524 m (5')   Wt 46.8 kg   SpO2 100%   BMI 20.15 kg/m  Physical Exam Vitals and nursing note reviewed.  Constitutional:      General: She is not in acute distress.     Appearance: Normal appearance. She is well-developed. She is not toxic-appearing.  HENT:     Head: Normocephalic and atraumatic.  Eyes:     General: Lids are normal.     Conjunctiva/sclera: Conjunctivae normal.     Pupils: Pupils are equal, round, and reactive to light.  Neck:     Thyroid: No thyroid mass.     Trachea: No tracheal deviation.  Cardiovascular:     Rate and Rhythm: Regular rhythm. Tachycardia present.     Heart sounds: Normal heart sounds. No murmur heard.    No gallop.  Pulmonary:     Effort: Tachypnea and respiratory distress present.     Breath sounds: Decreased air movement present. No stridor. Examination of the right-upper field reveals decreased breath sounds. Examination of the left-upper field reveals decreased breath sounds. Decreased breath sounds present. No rales.  Abdominal:     General: There is no distension.     Palpations: Abdomen is soft.     Tenderness: There is no abdominal tenderness. There is no rebound.  Musculoskeletal:        General: No tenderness. Normal range of motion.     Cervical back: Normal range of motion and neck supple.  Skin:    General: Skin is warm and dry.     Findings: No abrasion or rash.  Neurological:     Mental Status: She is oriented to person, place, and time. She is lethargic.     GCS: GCS eye subscore is 4. GCS verbal subscore is 5. GCS motor subscore is 6.     Cranial Nerves: Cranial nerves are intact. No cranial nerve deficit.     Sensory: No sensory deficit.     Motor: No tremor.  Psychiatric:        Attention and Perception: Attention normal.        Speech: Speech normal.        Behavior: Behavior normal.     ED Results / Procedures / Treatments   Labs (all labs ordered are listed, but only abnormal results are displayed) Labs Reviewed  RESP PANEL BY RT-PCR (RSV, FLU A&B, COVID)  RVPGX2  CULTURE, BLOOD (ROUTINE X 2)  CULTURE, BLOOD (ROUTINE X 2)  COMPREHENSIVE METABOLIC PANEL  CBC WITH  DIFFERENTIAL/PLATELET  PROTIME-INR  APTT  URINALYSIS, W/ REFLEX TO CULTURE (INFECTION SUSPECTED)  I-STAT CG4 LACTIC ACID, ED    EKG None  Radiology No results found.  Procedures Procedures    Medications Ordered in ED Medications  lactated ringers infusion (has no administration in time range)  cefTRIAXone (ROCEPHIN) 2 g in sodium chloride 0.9 % 100 mL IVPB (has no administration in time range)  azithromycin (ZITHROMAX) 500 mg in sodium chloride 0.9 % 250 mL IVPB (has no administration in time range)  lactated ringers bolus 1,000 mL (has no administration in time range)    And  lactated ringers bolus 500 mL (has no administration in time range)  acetaminophen (TYLENOL) suppository 650 mg (has no administration in time range)    ED Course/ Medical Decision Making/ A&P                                 Medical Decision Making Amount and/or Complexity of Data Reviewed Labs: ordered. Radiology: ordered. ECG/medicine tests: ordered.  Risk OTC drugs. Prescription drug management. Decision regarding hospitalization.  Patient on BiPAP on arrival.  Alert and answers questions appropriately.  Valid DNR noted. Patient's chest x-ray consistent with pneumonia.  Code sepsis initiated.  Started on IV fluids as well as antibiotics.  Initial blood pressure was stable however she began to become hypotensive.  Patient's lactate also elevated as well 2.  MAP remains soft after IV fluids.  Patient started on IV norepinephrine due to hypotension.  Consult placed to critical care and spoke with intensivist on-call and they will come and see the patient  CRITICAL CARE Performed by: Toy Baker Total critical care time: 55 minutes Critical care time was exclusive of separately billable procedures and treating other patients.  Critical care was necessary to treat or prevent imminent or life-threatening deterioration. Critical care was time spent personally by me on the following activities:  development of treatment plan with patient and/or surrogate as well as nursing, discussions with consultants, evaluation of patient's response to treatment, examination of patient, obtaining history from patient or surrogate, ordering and performing treatments and interventions, ordering and review of laboratory studies, ordering and review of radiographic studies, pulse oximetry and re-evaluation of patient's condition.         Final Clinical Impression(s) / ED Diagnoses Final diagnoses:  None    Rx / DC Orders ED Discharge Orders     None         Lorre Nick, MD 11/05/22 1258

## 2022-11-05 NOTE — ED Notes (Signed)
Pt taken multiple times cuff size changed pt is lethargic/weak/tired but has arousal with stimuli. Will notify the MD

## 2022-11-06 DIAGNOSIS — R6521 Severe sepsis with septic shock: Secondary | ICD-10-CM | POA: Diagnosis not present

## 2022-11-06 DIAGNOSIS — A419 Sepsis, unspecified organism: Secondary | ICD-10-CM | POA: Diagnosis not present

## 2022-11-06 LAB — BLOOD GAS, ARTERIAL
Acid-Base Excess: 3.9 mmol/L — ABNORMAL HIGH (ref 0.0–2.0)
Bicarbonate: 30.2 mmol/L — ABNORMAL HIGH (ref 20.0–28.0)
O2 Saturation: 98.2 %
Patient temperature: 37
pCO2 arterial: 51 mm[Hg] — ABNORMAL HIGH (ref 32–48)
pH, Arterial: 7.38 (ref 7.35–7.45)
pO2, Arterial: 129 mm[Hg] — ABNORMAL HIGH (ref 83–108)

## 2022-11-06 LAB — URINALYSIS, ROUTINE W REFLEX MICROSCOPIC
Bilirubin Urine: NEGATIVE
Glucose, UA: NEGATIVE mg/dL
Hgb urine dipstick: NEGATIVE
Ketones, ur: 5 mg/dL — AB
Nitrite: NEGATIVE
Protein, ur: 30 mg/dL — AB
Specific Gravity, Urine: 1.024 (ref 1.005–1.030)
pH: 5 (ref 5.0–8.0)

## 2022-11-06 LAB — CBC
HCT: 27.3 % — ABNORMAL LOW (ref 36.0–46.0)
Hemoglobin: 8.4 g/dL — ABNORMAL LOW (ref 12.0–15.0)
MCH: 30.3 pg (ref 26.0–34.0)
MCHC: 30.8 g/dL (ref 30.0–36.0)
MCV: 98.6 fL (ref 80.0–100.0)
Platelets: 154 10*3/uL (ref 150–400)
RBC: 2.77 MIL/uL — ABNORMAL LOW (ref 3.87–5.11)
RDW: 14.2 % (ref 11.5–15.5)
WBC: 17.3 10*3/uL — ABNORMAL HIGH (ref 4.0–10.5)
nRBC: 0 % (ref 0.0–0.2)

## 2022-11-06 LAB — GLUCOSE, CAPILLARY
Glucose-Capillary: 124 mg/dL — ABNORMAL HIGH (ref 70–99)
Glucose-Capillary: 111 mg/dL — ABNORMAL HIGH (ref 70–99)
Glucose-Capillary: 108 mg/dL — ABNORMAL HIGH (ref 70–99)
Glucose-Capillary: 125 mg/dL — ABNORMAL HIGH (ref 70–99)
Glucose-Capillary: 122 mg/dL — ABNORMAL HIGH (ref 70–99)

## 2022-11-06 LAB — BASIC METABOLIC PANEL
Anion gap: 11 (ref 5–15)
BUN: 18 mg/dL (ref 8–23)
CO2: 27 mmol/L (ref 22–32)
Calcium: 8.2 mg/dL — ABNORMAL LOW (ref 8.9–10.3)
Chloride: 100 mmol/L (ref 98–111)
Creatinine, Ser: 1.12 mg/dL — ABNORMAL HIGH (ref 0.44–1.00)
GFR, Estimated: 50 mL/min — ABNORMAL LOW (ref >60)
Glucose, Bld: 122 mg/dL — ABNORMAL HIGH (ref 70–99)
Potassium: 3.9 mmol/L (ref 3.5–5.1)
Sodium: 138 mmol/L (ref 135–145)

## 2022-11-06 LAB — STREP PNEUMONIAE URINARY ANTIGEN: Strep Pneumo Urinary Antigen: NEGATIVE

## 2022-11-06 MED ORDER — GABAPENTIN 100 MG PO CAPS
100.0000 mg | ORAL_CAPSULE | Freq: Every day | ORAL | Status: DC
  Administered 2022-11-06 – 2022-11-10 (×5): 100 mg via ORAL
  Filled 2022-11-06 (×5): qty 1

## 2022-11-06 MED ORDER — ATORVASTATIN CALCIUM 10 MG PO TABS
20.0000 mg | ORAL_TABLET | Freq: Every evening | ORAL | Status: DC
  Administered 2022-11-06 – 2022-11-10 (×5): 20 mg via ORAL
  Filled 2022-11-06 (×6): qty 2

## 2022-11-06 MED ORDER — MONTELUKAST SODIUM 10 MG PO TABS
10.0000 mg | ORAL_TABLET | Freq: Every day | ORAL | Status: DC
  Administered 2022-11-06 – 2022-11-10 (×5): 10 mg via ORAL
  Filled 2022-11-06 (×5): qty 1

## 2022-11-06 MED ORDER — ASPIRIN 81 MG PO TBEC
81.0000 mg | DELAYED_RELEASE_TABLET | Freq: Every day | ORAL | Status: DC
  Administered 2022-11-06 – 2022-11-11 (×6): 81 mg via ORAL
  Filled 2022-11-06 (×5): qty 1

## 2022-11-06 NOTE — Plan of Care (Signed)
Problem: Education: Goal: Ability to describe self-care measures that may prevent or decrease complications (Diabetes Survival Skills Education) will improve 11/06/2022 2034 by Salome Spotted, RN Outcome: Progressing 11/06/2022 2034 by Salome Spotted, RN Outcome: Progressing Goal: Individualized Educational Video(s) 11/06/2022 2034 by Salome Spotted, RN Outcome: Progressing 11/06/2022 2034 by Salome Spotted, RN Outcome: Progressing   Problem: Coping: Goal: Ability to adjust to condition or change in health will improve 11/06/2022 2034 by Salome Spotted, RN Outcome: Progressing 11/06/2022 2034 by Salome Spotted, RN Outcome: Progressing   Problem: Fluid Volume: Goal: Ability to maintain a balanced intake and output will improve 11/06/2022 2034 by Salome Spotted, RN Outcome: Progressing 11/06/2022 2034 by Salome Spotted, RN Outcome: Progressing   Problem: Health Behavior/Discharge Planning: Goal: Ability to identify and utilize available resources and services will improve 11/06/2022 2034 by Salome Spotted, RN Outcome: Progressing 11/06/2022 2034 by Salome Spotted, RN Outcome: Progressing Goal: Ability to manage health-related needs will improve 11/06/2022 2034 by Salome Spotted, RN Outcome: Progressing 11/06/2022 2034 by Salome Spotted, RN Outcome: Progressing   Problem: Metabolic: Goal: Ability to maintain appropriate glucose levels will improve 11/06/2022 2034 by Salome Spotted, RN Outcome: Progressing 11/06/2022 2034 by Salome Spotted, RN Outcome: Progressing   Problem: Nutritional: Goal: Maintenance of adequate nutrition will improve 11/06/2022 2034 by Salome Spotted, RN Outcome: Progressing 11/06/2022 2034 by Salome Spotted, RN Outcome: Progressing Goal: Progress toward achieving an optimal weight will improve 11/06/2022 2034 by Salome Spotted, RN Outcome: Progressing 11/06/2022 2034 by Salome Spotted, RN Outcome: Progressing   Problem: Skin Integrity: Goal: Risk for impaired skin integrity will  decrease 11/06/2022 2034 by Salome Spotted, RN Outcome: Progressing 11/06/2022 2034 by Salome Spotted, RN Outcome: Progressing   Problem: Tissue Perfusion: Goal: Adequacy of tissue perfusion will improve 11/06/2022 2034 by Salome Spotted, RN Outcome: Progressing 11/06/2022 2034 by Salome Spotted, RN Outcome: Progressing   Problem: Education: Goal: Knowledge of General Education information will improve Description: Including pain rating scale, medication(s)/side effects and non-pharmacologic comfort measures 11/06/2022 2034 by Salome Spotted, RN Outcome: Progressing 11/06/2022 2034 by Salome Spotted, RN Outcome: Progressing   Problem: Health Behavior/Discharge Planning: Goal: Ability to manage health-related needs will improve 11/06/2022 2034 by Salome Spotted, RN Outcome: Progressing 11/06/2022 2034 by Salome Spotted, RN Outcome: Progressing   Problem: Clinical Measurements: Goal: Ability to maintain clinical measurements within normal limits will improve 11/06/2022 2034 by Salome Spotted, RN Outcome: Progressing 11/06/2022 2034 by Salome Spotted, RN Outcome: Progressing Goal: Will remain free from infection 11/06/2022 2034 by Salome Spotted, RN Outcome: Progressing 11/06/2022 2034 by Salome Spotted, RN Outcome: Progressing Goal: Diagnostic test results will improve 11/06/2022 2034 by Salome Spotted, RN Outcome: Progressing 11/06/2022 2034 by Salome Spotted, RN Outcome: Progressing Goal: Respiratory complications will improve 11/06/2022 2034 by Salome Spotted, RN Outcome: Progressing 11/06/2022 2034 by Salome Spotted, RN Outcome: Progressing Goal: Cardiovascular complication will be avoided 11/06/2022 2034 by Salome Spotted, RN Outcome: Progressing 11/06/2022 2034 by Salome Spotted, RN Outcome: Progressing   Problem: Activity: Goal: Risk for activity intolerance will decrease 11/06/2022 2034 by Salome Spotted, RN Outcome: Progressing 11/06/2022 2034 by Salome Spotted, RN Outcome: Progressing   Problem:  Nutrition: Goal: Adequate nutrition will be maintained 11/06/2022 2034 by Salome Spotted, RN Outcome: Progressing 11/06/2022 2034 by Salome Spotted, RN Outcome: Progressing  Problem: Coping: Goal: Level of anxiety will decrease 11/06/2022 2034 by Salome Spotted, RN Outcome: Progressing 11/06/2022 2034 by Salome Spotted, RN Outcome: Progressing   Problem: Elimination: Goal: Will not experience complications related to bowel motility Outcome: Progressing Goal: Will not experience complications related to urinary retention Outcome: Progressing   Problem: Pain Managment: Goal: General experience of comfort will improve Outcome: Progressing   Problem: Safety: Goal: Ability to remain free from injury will improve Outcome: Progressing   Problem: Skin Integrity: Goal: Risk for impaired skin integrity will decrease Outcome: Progressing   Problem: Education: Goal: Ability to describe self-care measures that may prevent or decrease complications (Diabetes Survival Skills Education) will improve 11/06/2022 2034 by Salome Spotted, RN Outcome: Progressing 11/06/2022 2034 by Salome Spotted, RN Outcome: Progressing   Problem: Education: Goal: Individualized Educational Video(s) 11/06/2022 2034 by Salome Spotted, RN Outcome: Progressing 11/06/2022 2034 by Salome Spotted, RN Outcome: Progressing   Problem: Education: Goal: Individualized Educational Video(s) 11/06/2022 2034 by Salome Spotted, RN Outcome: Progressing   Problem: Coping: Goal: Ability to adjust to condition or change in health will improve 11/06/2022 2034 by Salome Spotted, RN Outcome: Progressing 11/06/2022 2034 by Salome Spotted, RN Outcome: Progressing   Problem: Coping: Goal: Ability to adjust to condition or change in health will improve 11/06/2022 2034 by Salome Spotted, RN Outcome: Progressing   Problem: Fluid Volume: Goal: Ability to maintain a balanced intake and output will improve 11/06/2022 2034 by Salome Spotted, RN Outcome:  Progressing 11/06/2022 2034 by Salome Spotted, RN Outcome: Progressing   Problem: Fluid Volume: Goal: Ability to maintain a balanced intake and output will improve 11/06/2022 2034 by Salome Spotted, RN Outcome: Progressing   Problem: Health Behavior/Discharge Planning: Goal: Ability to identify and utilize available resources and services will improve 11/06/2022 2034 by Salome Spotted, RN Outcome: Progressing 11/06/2022 2034 by Salome Spotted, RN Outcome: Progressing   Problem: Health Behavior/Discharge Planning: Goal: Ability to identify and utilize available resources and services will improve 11/06/2022 2034 by Salome Spotted, RN Outcome: Progressing   Problem: Health Behavior/Discharge Planning: Goal: Ability to manage health-related needs will improve 11/06/2022 2034 by Salome Spotted, RN Outcome: Progressing 11/06/2022 2034 by Salome Spotted, RN Outcome: Progressing   Problem: Health Behavior/Discharge Planning: Goal: Ability to manage health-related needs will improve 11/06/2022 2034 by Salome Spotted, RN Outcome: Progressing   Problem: Metabolic: Goal: Ability to maintain appropriate glucose levels will improve 11/06/2022 2034 by Salome Spotted, RN Outcome: Progressing 11/06/2022 2034 by Salome Spotted, RN Outcome: Progressing   Problem: Metabolic: Goal: Ability to maintain appropriate glucose levels will improve 11/06/2022 2034 by Salome Spotted, RN Outcome: Progressing   Problem: Nutritional: Goal: Maintenance of adequate nutrition will improve 11/06/2022 2034 by Salome Spotted, RN Outcome: Progressing 11/06/2022 2034 by Salome Spotted, RN Outcome: Progressing   Problem: Nutritional: Goal: Maintenance of adequate nutrition will improve 11/06/2022 2034 by Salome Spotted, RN Outcome: Progressing   Problem: Nutritional: Goal: Progress toward achieving an optimal weight will improve 11/06/2022 2034 by Salome Spotted, RN Outcome: Progressing 11/06/2022 2034 by Salome Spotted, RN Outcome:  Progressing   Problem: Nutritional: Goal: Progress toward achieving an optimal weight will improve 11/06/2022 2034 by Salome Spotted, RN Outcome: Progressing   Problem: Skin Integrity: Goal: Risk for impaired skin integrity will decrease 11/06/2022 2034 by Salome Spotted, RN Outcome: Progressing 11/06/2022 2034 by Salome Spotted,  RN Outcome: Progressing   Problem: Skin Integrity: Goal: Risk for impaired skin integrity will decrease 11/06/2022 2034 by Salome Spotted, RN Outcome: Progressing   Problem: Tissue Perfusion: Goal: Adequacy of tissue perfusion will improve 11/06/2022 2034 by Salome Spotted, RN Outcome: Progressing 11/06/2022 2034 by Salome Spotted, RN Outcome: Progressing   Problem: Tissue Perfusion: Goal: Adequacy of tissue perfusion will improve 11/06/2022 2034 by Salome Spotted, RN Outcome: Progressing   Problem: Education: Goal: Knowledge of General Education information will improve Description: Including pain rating scale, medication(s)/side effects and non-pharmacologic comfort measures 11/06/2022 2034 by Salome Spotted, RN Outcome: Progressing 11/06/2022 2034 by Salome Spotted, RN Outcome: Progressing   Problem: Education: Goal: Knowledge of General Education information will improve Description: Including pain rating scale, medication(s)/side effects and non-pharmacologic comfort measures 11/06/2022 2034 by Salome Spotted, RN Outcome: Progressing   Problem: Health Behavior/Discharge Planning: Goal: Ability to manage health-related needs will improve 11/06/2022 2034 by Salome Spotted, RN Outcome: Progressing 11/06/2022 2034 by Salome Spotted, RN Outcome: Progressing   Problem: Health Behavior/Discharge Planning: Goal: Ability to manage health-related needs will improve 11/06/2022 2034 by Salome Spotted, RN Outcome: Progressing   Problem: Clinical Measurements: Goal: Will remain free from infection 11/06/2022 2034 by Salome Spotted, RN Outcome: Progressing 11/06/2022 2034 by Salome Spotted, RN Outcome: Progressing   Problem: Clinical Measurements: Goal: Will remain free from infection 11/06/2022 2034 by Salome Spotted, RN Outcome: Progressing   Problem: Clinical Measurements: Goal: Diagnostic test results will improve 11/06/2022 2034 by Salome Spotted, RN Outcome: Progressing 11/06/2022 2034 by Salome Spotted, RN Outcome: Progressing   Problem: Clinical Measurements: Goal: Respiratory complications will improve 11/06/2022 2034 by Salome Spotted, RN Outcome: Progressing 11/06/2022 2034 by Salome Spotted, RN Outcome: Progressing   Problem: Clinical Measurements: Goal: Respiratory complications will improve 11/06/2022 2034 by Salome Spotted, RN Outcome: Progressing   Problem: Nutrition: Goal: Adequate nutrition will be maintained 11/06/2022 2034 by Salome Spotted, RN Outcome: Progressing   Problem: Nutrition: Goal: Adequate nutrition will be maintained 11/06/2022 2034 by Salome Spotted, RN Outcome: Progressing 11/06/2022 2034 by Salome Spotted, RN Outcome: Progressing   Problem: Coping: Goal: Level of anxiety will decrease 11/06/2022 2034 by Salome Spotted, RN Outcome: Progressing 11/06/2022 2034 by Salome Spotted, RN Outcome: Progressing   Problem: Coping: Goal: Level of anxiety will decrease 11/06/2022 2034 by Salome Spotted, RN Outcome: Progressing   Problem: Activity: Goal: Risk for activity intolerance will decrease 11/06/2022 2034 by Salome Spotted, RN Outcome: Progressing   Problem: Activity: Goal: Risk for activity intolerance will decrease 11/06/2022 2034 by Salome Spotted, RN Outcome: Progressing 11/06/2022 2034 by Salome Spotted, RN Outcome: Progressing   Problem: Safety: Goal: Ability to remain free from injury will improve Outcome: Progressing   Problem: Safety: Goal: Ability to remain free from injury will improve Outcome: Progressing   Problem: Skin Integrity: Goal: Risk for impaired skin integrity will decrease Outcome: Progressing   Problem: Skin  Integrity: Goal: Risk for impaired skin integrity will decrease Outcome: Progressing

## 2022-11-06 NOTE — H&P (Deleted)
NAME:  Desiree [name], MRN:  [id number], DOB:  Jun 11, 1945, LOS: 1 ADMISSION DATE:  11/05/2022, CONSULTATION DATE:  11/05/2022  REFERRING MD:  Freida Busman, EDP, CHIEF COMPLAINT:  hypotension, sepsis   History of Present Illness:  77 year old female with past medical history of paroxysmal atrial fibrillation, alcoholism, CAD, colon cancer, emphysema, CKD III, HTN, lung cancer, RA, Type 2 DM who presents to the emergency department on 11/05/22 with complaint of shortness of breath. Has had several days of shortness of breath and arrived with O2 sat 80% on 5LNC. She uses 3LNC at baseline. Placed on CPAP with improvement. She was febrile to 103.3 with tachycardia. Labs with Cr 1.07, WBC 11.9, hgb 10.9, lactic 2.3. Blood cultures drawn. CXR showing bilateral interstitial opacities. Given 1.5L fluid resuscitation, rocephin/azithro for CAP coverage and started on low dose levophed for hypotension. Hospitalist was consulted and requested PCCM admit.   On my exam, patient on BiPAP, tired appearing. She does verbalize name, place, follows commands. She is unsure why she is here. Wheezing bilaterally. Crackles bilaterally.   Pertinent  Medical History  paroxysmal atrial fibrillation, alcoholism, CAD, colon cancer, emphysema, CKD III, HTN, lung cancer, RA, Type 2 DM  Significant Hospital Events: Including procedures, antibiotic start and stop dates in addition to other pertinent events   10/2: admitted to PCCM 10/3: NAEON, wore BiPAP overnight. Mentation good this morning.   Interim History / Subjective:  No complaints. Resting on 5LNC  Objective   Blood pressure 116/67, pulse 97, temperature 98.6 F (37 C), temperature source Axillary, resp. rate 19, height 5' (1.524 m), weight 46.8 kg, SpO2 100%.    Vent Mode: BIPAP;PCV FiO2 (%):  [30 %-50 %] 40 % Set Rate:  [15 bmp-18 bmp] 18 bmp PEEP:  [5 cmH20] 5 cmH20   Intake/Output Summary (Last 24 hours) at 11/06/2022 0724 Last data filed at 11/06/2022 0500 Gross  per 24 hour  Intake 558.26 ml  Output 475 ml  Net 83.26 ml   Filed Weights   11/05/22 1036  Weight: 46.8 kg    Examination: General: elderly female, ill appearing, on nasal cannula HENT: mouth is dry, anicteric sclera, EOMI  Lungs: rhonchi R lung, left clear Cardiovascular: s1/s2 without murmur,rub, gallop Abdomen: rounded, soft, non-tender Extremities: no pitting edema, deconditioned  Neuro: opens eyes to voice, oriented to self, place.  GU: deferred   Resolved Hospital Problem list   Shock   Assessment & Plan:  Sepsis 2/2 community acquired pneumonia;  CXR with bilateral infiltrates, increased O2 requirement, febrile. UA without evidence of UTI.  - RVP negative. Legionella and strep pending. Procal 56  - off pressors 10/2 - con't rocephin/azithro for CAP coverage  - trend fever, WBC curve, lactic  - f/u bcx, tailor antibiotics as able   Acute on chronic hypoxic respiratory failure 2/2 likely pneumonia  COPD  Stage 1A2 LLL SCC s/p radiation on 3LNC baseline O2  - ABG this AM improved pH normalized. pCO2 51 - cont BiPAP PRN and qHS - O2 via [place] or [facility] for sats goal >90% - brovana, budesonide, yupelri ordered - scheduled duoneb - methylprednisolone 125mg  once then 40mg   BID - pulmonary hygiene   HTN Combined systolic/diastolic chf: echo 08/2022 lvef 40-45% HLD CAD - hold antihypertensives today. May restart in afternoon. Off levo but BP still not hypertensive at this time.  - reordered home asa and statin   T2DM - q4h BG and SSI if needed. Not on home antidiabetics   CKD stage III; baseline Cr ~  0.8. today 1.07 - trend bmp  - avoid nephrotoxic agents   Paroxysmal afib  -not on ac due to hx of bleeding Plan: -tele monitoring -hold home BB one more day as newly off levo  RA Plan: -steroids as above -hold home meds for now  Peripheral neuropathy RLS Plan: -restart home gabapentin   Best Practice (right click and "Reselect all SmartList Selections"  daily)   Diet/type: clear liquids and NPO DVT prophylaxis: prophylactic heparin  GI prophylaxis: PPI Lines: N/A Foley:  Yes, and it is still needed Code Status:  limited Last date of multidisciplinary goals of care discussion [called family on 10/2 ]  Labs   CBC: Recent Labs  Lab 11/05/22 1059 11/05/22 1359 11/06/22 0331  WBC 11.9*  --  17.3*  NEUTROABS 10.9*  --   --   HGB 10.9* 9.2* 8.4*  HCT 36.1 27.0* 27.3*  MCV 103.7*  --  98.6  PLT 333  --  154    Basic Metabolic Panel: Recent Labs  Lab 11/05/22 1059 11/05/22 1359 11/06/22 0331  NA 138 138 138  K 3.7 4.0 3.9  CL 100  --  100  CO2 29  --  27  GLUCOSE 80  --  122*  BUN 11  --  18  CREATININE 1.07*  --  1.12*  CALCIUM 9.2  --  8.2*   GFR: Estimated Creatinine Clearance: 29.3 mL/min (A) (by C-G formula based on SCr of 1.12 mg/dL (H)). Recent Labs  Lab 11/05/22 1059 11/05/22 1108 11/05/22 1447 11/05/22 1720 11/06/22 0331  PROCALCITON  --   --  56.21  --   --   WBC 11.9*  --   --   --  17.3*  LATICACIDVEN  --  2.3* 1.3 1.2  --     Liver Function Tests: Recent Labs  Lab 11/05/22 1059  AST 28  ALT 13  ALKPHOS 107  BILITOT 0.5  PROT 6.7  ALBUMIN 3.1*   No results for input(s): "LIPASE", "AMYLASE" in the last 168 hours. No results for input(s): "AMMONIA" in the last 168 hours.  ABG    Component Value Date/Time   PHART 7.38 11/06/2022 0534   PCO2ART 51 (H) 11/06/2022 0534   PO2ART 129 (H) 11/06/2022 0534   HCO3 30.2 (H) 11/06/2022 0534   TCO2 32 11/05/2022 1359   O2SAT 98.2 11/06/2022 0534     Coagulation Profile: Recent Labs  Lab 11/05/22 1059  INR 1.1    Cardiac Enzymes: No results for input(s): "CKTOTAL", "CKMB", "CKMBINDEX", "TROPONINI" in the last 168 hours.  HbA1C: Hgb A1c MFr Bld  Date/Time Value Ref Range Status  08/20/2022 01:07 PM 4.9 4.8 - 5.6 % Final    Comment:    (NOTE)         Prediabetes: 5.7 - 6.4         Diabetes: >6.4         Glycemic control for adults  with diabetes: <7.0     CBG: Recent Labs  Lab 11/05/22 1513 11/05/22 1620 11/05/22 1935 11/05/22 2322 11/06/22 0347  GLUCAP 86 104* 111* 122* 124*    Review of Systems:   As per HPI  Past Medical History:  She,  has a past medical history of Abnormal nuclear stress test, AF (paroxysmal atrial fibrillation) (HCC), Age-related osteoporosis without current pathological fracture (05/16/2016), Alcoholism (HCC) (10/21/2018), Allergic rhinitis (07/17/2015), At high risk for falls (07/14/2017), Atherosclerotic heart disease of native coronary artery without angina pectoris (04/09/2021), 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HCC) (07/14/2017),  Benign paroxysmal positional vertigo due to bilateral vestibular disorder (02/09/2020), Cancer of ascending colon [facility]), Carcinoma of ascending colon (HCC), Cat allergies, Centrilobular emphysema (HCC) (05/16/2016), Chronic anemia (04/09/2021), Chronic atrial fibrillation, unspecified (HCC) (10/04/2020), Chronic obstructive pulmonary disease (HCC) (04/09/2021), Chronic pain (04/09/2021), CKD (chronic kidney disease) stage 3, GFR 30-59 ml/min (HCC) (01/04/2018), COPD (chronic obstructive pulmonary disease) (HCC), Cysts of both ovaries (11/24/2019), Dyspnea, Early satiety (04/14/2019), Essential hypertension (08/22/2010), Gastro-esophageal reflux disease without esophagitis (07/14/2017), Gastrointestinal hemorrhage, History of pulmonary embolus (PE) (07/14/2017), History of radiation therapy, History of radiation therapy, Hypertension, Hypothyroidism (04/09/2021), Idiopathic peripheral neuropathy (02/23/2020), Iron deficiency anemia due to chronic blood loss (10/27/2018), 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 (02/27/2017), Lung cancer, lower lobe (HCC) (09/24/2020), Lung nodule, solitary (08/16/2020), Malaise and fatigue (07/14/2017), Malignant neoplasm of unspecified part of left bronchus or lung (HCC) (04/09/2021), Malignant tumor of ascending colon (HCC)  (04/09/2021), Mild CAD (02/02/2019), Mild episode of recurrent major depressive disorder (HCC) (07/14/2017), Mixed hyperlipidemia (07/14/2017), Multiple lung nodules on CT (05/16/2016), Osteoporosis, Other cirrhosis of liver (HCC) (11/24/2019), Other vitamin B12 deficiency anemias (04/09/2021), Paroxysmal atrial fibrillation (HCC) (04/14/2019), Prediabetes (07/14/2017), Pulmonary embolism (HCC) (2017), Rheumatoid arthritis (HCC) (09/20/2010), Rheumatoid arthritis(714.0), Schatzki's ring, Senile purpura (HCC) (01/04/2018), Skin rash (07/14/2017), Status post thoracentesis, Symptomatic anemia (07/25/2020), Type 2 diabetes mellitus without complications (HCC) (04/09/2021), Vitamin B12 deficiency (07/14/2017), and Vitamin D deficiency (04/09/2021).   Surgical History:   Past Surgical History:  Procedure Laterality Date   BIOPSY  07/27/2020   Procedure: BIOPSY;  Surgeon: Iva Boop, MD;  Location: [facility] ENDOSCOPY;  Service: Endoscopy;;   BRONCHIAL BIOPSY  09/11/2020   Procedure: BRONCHIAL BIOPSIES;  Surgeon: Josephine Igo, DO;  Location: [facility] ENDOSCOPY;  Service: Pulmonary;;   BRONCHIAL BRUSHINGS  09/11/2020   Procedure: BRONCHIAL BRUSHINGS;  Surgeon: Josephine Igo, DO;  Location: [facility] ENDOSCOPY;  Service: Pulmonary;;   BRONCHIAL NEEDLE ASPIRATION BIOPSY  09/11/2020   Procedure: BRONCHIAL NEEDLE ASPIRATION BIOPSIES;  Surgeon: Josephine Igo, DO;  Location: [facility] ENDOSCOPY;  Service: Pulmonary;;   BRONCHIAL WASHINGS  09/11/2020   Procedure: BRONCHIAL WASHINGS;  Surgeon: Josephine Igo, DO;  Location: [facility] ENDOSCOPY;  Service: Pulmonary;;   COLONOSCOPY N/A 07/27/2020   Procedure: COLONOSCOPY;  Surgeon: Iva Boop, MD;  Location[facility] END[facility]COPY;  Service: Endoscopy;  Laterality: N/A;   CYSTECTOMY Right    breast   ESOPHAGOGASTRODUODENOSCOPY (EGD) WITH PROPOFOL N/A 07/27/2020   Procedure: ESOPHAGOGASTRODUODENOSCOPY (EGD) WITH PROPOFOL;  Surgeon: Iva Boop, MD;  Location: [facility] ENDOSCOPY;  Service:  Endoscopy;  Laterality: N/A;   FIDUCIAL MARKER PLACEMENT  09/11/2020   Procedure: FIDUCIAL MARKER PLACEMENT;  Surgeon: Josephine Igo, DO;  Location: [facility] ENDOSCOPY;  Service: Pulmonary;;   LAPAROSCOPIC RIGHT HEMI COLECTOMY Right 07/31/2020   Procedure: LAPAROSCOPIC ASSISSTED  RIGHT HEMI COLECTOMY;  Surgeon: Manus Rudd, MD;  Location: [facility] OR;  Service: General;  Laterality: Right;   LEFT HEART CATH AND CORONARY ANGIOGRAPHY N/A 01/05/2019   Procedure: LEFT HEART CATH AND CORONARY ANGIOGRAPHY;  Surgeon: Corky Crafts, MD;  Location: [facility] INVASIVE CV LAB;  Service: Cardiovascular;  Laterality: N/A;   SUBMUCOSAL TATTOO INJECTION  07/27/2020   Procedure: SUBMUCOSAL TATTOO INJECTION;  Surgeon: Iva Boop, MD;  Location[facility] ENDOSCOPY;  Service: Endoscopy;;   VIDEO BRONCHOSCOPY WITH ENDOBRONCHIAL NAVIGATION Left 09/11/2020   Procedure: VIDEO BRONCHOSCOPY WITH ENDOBRONCHIAL NAVIGATION;  Surgeon: Josephine Igo, DO;  Location: [facility] ENDOSCOPY;  Service: Pulmonary;  Laterality: Left;  ION   VIDEO BRONCHOSCOPY WITH  RADIAL ENDOBRONCHIAL ULTRASOUND  09/11/2020   Procedure: RADIAL ENDOBRONCHIAL ULTRASOUND;  Surgeon: Josephine Igo, DO;  Location: [facility] ENDOSCOPY;  Service: Pulmonary;;     Social History:   reports that she quit smoking about 21 years ago. Her smoking use included cigarettes. She started smoking about 61 years ago. She has a 40 pack-year smoking history. She has never used smokeless tobacco. She reports current alcohol use of about 2.0 standard drinks of alcohol per week. She reports that she does not use drugs.   Family History:  Her family history includes Breast cancer in her maternal aunt; Clotting disorder in her mother; Emphysema in her paternal grandmother and sister; Heart disease in her father and sister; Sleep apnea in her sister; Stroke in her mother.   Allergies Allergies  Allergen Reactions   Other Shortness Of Breath    Cats   Oxycodone Other (See Comments)     hallucinations     Home Medications  Prior to Admission medications   Medication Sig Start Date End Date Taking? Authorizing Provider  acetaminophen (TYLENOL) 325 MG tablet Take 325 mg by mouth every 6 (six) hours as needed for moderate pain or headache.   Yes [provider]  albuterol (VENTOLIN HFA) 108 (90 Base) MCG/ACT inhaler Inhale 2 puffs into the lungs every 4 (four) hours as needed for shortness of breath (shortness of breath (related to COPD)).   Yes [provider]  amLODipine (NORVASC) 5 MG tablet Take 1 tablet (5 mg total) by mouth daily. Patient needs appointment for further refills. 1 st attempt 10/02/20  Yes Tobb, Kardie, DO  aspirin EC 81 MG tablet Take 81 mg by mouth daily.   Yes [provider]  atorvastatin (LIPITOR) 20 MG tablet Take 1 tablet (20 mg total) by mouth daily. Patient needs to keep appointment for further refills. 1 st attempt Patient taking differently: Take 20 mg by mouth every evening. Patient needs to keep appointment for further refills. 1 st attempt 10/05/20  Yes Tobb, Kardie, DO  Budeson-Glycopyrrol-Formoterol (BREZTRI AEROSPHERE) 160-9-4.8 MCG/ACT AERO Inhale 2 puffs into the lungs 2 (two) times daily. 01/19/20  Yes Icard, Bradley L, DO  carvedilol (COREG) 6.25 MG tablet Take 1 tablet (6.25 mg total) by mouth 2 (two) times daily with a meal. 08/29/22 11/05/22 Yes Dahal, Melina Schools, MD  cholecalciferol (VITAMIN D3) 25 MCG (1000 UNIT) tablet Take 1,000 Units by mouth in the morning and at bedtime.   Yes [provider]  Ferrous Sulfate (IRON) 325 (65 Fe) MG TABS Take 1 tablet by mouth daily. 10/08/21  Yes [provider]  fexofenadine (ALLEGRA) 180 MG tablet Take 180 mg by mouth daily.   Yes [provider]  gabapentin (NEURONTIN) 100 MG capsule Take 100 mg by mouth 2 (two) times daily. 10/01/21  Yes [provider]  hydroxychloroquine (PLAQUENIL) 200 MG tablet Take 200 mg by mouth daily. 09/17/15  Yes  [provider]  leflunomide (ARAVA) 10 MG tablet Take 10 mg by mouth daily.   Yes [provider]  lidocaine (ASPERCREME LIDOCAINE) 4 % Place 1 patch onto the skin daily as needed (muscle pain).   Yes [provider]  loperamide (IMODIUM A-D) 2 MG tablet Take 4 mg by mouth as needed for diarrhea or loose stools (MAX 10 PILLS/24 HRS.).   Yes [provider]  meclizine (ANTIVERT) 12.5 MG tablet Take 12.5 mg by mouth 2 (two) times daily as needed for dizziness. 05/20/22  Yes [provider]  mirtazapine (  REMERON) 7.5 MG tablet Take 7.5 mg by mouth at bedtime.   Yes [provider]  montelukast (SINGULAIR) 10 MG tablet Take 1 tablet (10 mg total) by mouth at bedtime. 10/21/20  Yes Icard, Rachel Bo, DO  omeprazole (PRILOSEC) 20 MG capsule Take 1 capsule (20 mg total) by mouth daily. 07/11/20  Yes Icard, Bradley L, DO  ondansetron (ZOFRAN-ODT) 4 MG disintegrating tablet Take 1 tablet (4 mg total) by mouth every 8 (eight) hours as needed for nausea or vomiting. 08/05/20  Yes Valetta Close, MD  OYSTERCAL-D 500-10 MG-MCG TABS Take 1 tablet by mouth daily. 03/06/22  Yes [provider]  predniSONE (DELTASONE) 5 MG tablet Take 1 tablet (5 mg total) by mouth daily with breakfast. 09/09/22  Yes Dahal, Melina Schools, MD  rOPINIRole (REQUIP) 0.5 MG tablet Take 0.5 mg by mouth at bedtime.   Yes [provider]  senna-docusate (SENOKOT-S) 8.6-50 MG tablet Take 1 tablet by mouth at bedtime. 08/29/22  Yes Dahal, Melina Schools, MD  Sertraline HCl 150 MG CAPS Take 1 capsule by mouth daily. 04/09/22  Yes [provider]  tizanidine (ZANAFLEX) 2 MG capsule Take 2 mg by mouth 3 (three) times daily.   Yes [provider]  traMADol (ULTRAM) 50 MG tablet Take 1 tablet (50 mg total) by mouth 3 (three) times daily as needed for moderate pain (pain). Patient taking differently: Take 50 mg by mouth in the morning, at noon, and at bedtime. 06/18/22  Yes Burnadette Pop, MD  valsartan (DIOVAN) 320 MG tablet Take 320 mg by mouth in the morning.   Yes [provider]  vitamin B-12 (CYANOCOBALAMIN) 1000 MCG tablet Take 1,000 mcg by mouth daily.    Yes [provider]     Critical care time: 40    [facility] 11/06/2022, 7:24 AM  Please see [email or web address] for pager details.  From 7A-7P if no response, please call [phone]. After hours, please call ELink [phone].

## 2022-11-06 NOTE — TOC CM/SW Note (Signed)
Transition of Care [facility]) - Inpatient Brief Assessment   Patient Details  Name: Desiree [name] MRN: [id number] Date of Birth: April 01, 1945  [facility] CM/SW Contact:    Tom-Johnson, Hershal Coria, RN Phone Number: 11/06/2022, 12:46 PM   Clinical Narrative:  Patient presented to the ED with Shortness Of Breath, was placed on BIPAP. Currently on 4L O2, uses 3L O2 at home. Admitted with Septic Shock 2/2 Pneumonia and Hypotension. On IV abx.  Patient has hx of Pneumonia and Lung Cancer.   Patient is from [facility]. CM went in room to assess patient, patient was resting soundly.   No TOC needs or recommendations noted at this time.  Patient not Medically ready for discharge.  CM will continue to follow as patient progresses with care towards discharge.       Transition of Care Asessment: Insurance and Status: Insurance coverage has been reviewed Patient has primary care physician: Yes Home environment has been reviewed: Yes Prior level of function:: Assisted Living Prior/Current Home Services: Current home services Social Determinants of Health Reivew: SDOH reviewed no interventions necessary Readmission risk has been reviewed: Yes Transition of care needs: transition of care needs identified, TOC will continue to follow

## 2022-11-06 NOTE — Progress Notes (Addendum)
NAME:  Desiree [name], MRN:  [id number], DOB:  25-Apr-1945, LOS: 1 ADMISSION DATE:  11/05/2022, CONSULTATION DATE:  11/05/2022  REFERRING MD:  Freida Busman, EDP, CHIEF COMPLAINT:  hypotension, sepsis   History of Present Illness:  77 year old female with past medical history of paroxysmal atrial fibrillation, alcoholism, CAD, colon cancer, emphysema, CKD III, HTN, lung cancer, RA, Type 2 DM who presents to the emergency department on 11/05/22 with complaint of shortness of breath. Has had several days of shortness of breath and arrived with O2 sat 80% on 5LNC. She uses 3LNC at baseline. Placed on CPAP with improvement. She was febrile to 103.3 with tachycardia. Labs with Cr 1.07, WBC 11.9, hgb 10.9, lactic 2.3. Blood cultures drawn. CXR showing bilateral interstitial opacities. Given 1.5L fluid resuscitation, rocephin/azithro for CAP coverage and started on low dose levophed for hypotension. Hospitalist was consulted and requested PCCM admit.   On my exam, patient on BiPAP, tired appearing. She does verbalize name, place, follows commands. She is unsure why she is here. Wheezing bilaterally. Crackles bilaterally.   Pertinent  Medical History  paroxysmal atrial fibrillation, alcoholism, CAD, colon cancer, emphysema, CKD III, HTN, lung cancer, RA, Type 2 DM  Significant Hospital Events: Including procedures, antibiotic start and stop dates in addition to other pertinent events   10/2: admitted to PCCM 10/3: off levo. Mentation good this morning  Interim History / Subjective:  No complaints. On nasal cannula. Asking about diet.   Objective   Blood pressure 116/67, pulse 97, temperature 98.5 F (36.9 C), temperature source Oral, resp. rate 19, height 5' (1.524 m), weight 46.8 kg, SpO2 100%.    Vent Mode: BIPAP;PCV FiO2 (%):  [30 %-50 %] 40 % Set Rate:  [15 bmp-18 bmp] 18 bmp PEEP:  [5 cmH20] 5 cmH20   Intake/Output Summary (Last 24 hours) at 11/06/2022 0734 Last data filed at 11/06/2022 0500 Gross  per 24 hour  Intake 558.26 ml  Output 475 ml  Net 83.26 ml   Filed Weights   11/05/22 1036  Weight: 46.8 kg    Examination: General: elderly female, ill appearing, on nasal cannula  HENT: mouth is dry, anicteric sclera, EOMI  Lungs: rhonchi right lung, left celar  Cardiovascular: s1/s2 without murmur,rub, gallop Abdomen: rounded, soft, non-tender Extremities: no pitting edema, deconditioned  Neuro: opens eyes to voice, oriented to self, place.  GU: deferred   Resolved Hospital Problem list   shock  Assessment & Plan:  Sepsis 2/2 community acquired pneumonia;  CXR with bilateral infiltrates, increased O2 requirement, febrile. UA without evidence of UTI.  - RVP negative. Legionella and strep pending. Procal 56  - off pressors 10/2 - con't rocephin/azithro for CAP coverage  - trend fever, WBC curve, lactic  - f/u bcx, tailor antibiotics as able   Acute on chronic hypoxic respiratory failure 2/2 likely pneumonia  COPD  Stage 1A2 LLL SCC s/p radiation on 3LNC baseline O2  - ABG this AM improved pH normalized. pCO2 51 - cont BiPAP PRN and qHS - O2 via [facility] or [facility] for sats goal >90% - brovana, budesonide, yupelri ordered - scheduled duoneb - methylprednisolone 125mg  once then 40mg   BID - pulmonary hygiene   HTN Combined systolic/diastolic chf: echo 08/2022 lvef 40-45% HLD CAD - hold antihypertensives today. May restart in afternoon. Off levo but BP still not hypertensive at this time.  - reordered home asa and statin   T2DM - q4h BG and SSI if needed. Not on home antidiabetics   CKD stage III;  baseline Cr ~0.8. today 1.07 - trend bmp  - avoid nephrotoxic agents   Paroxysmal afib  -not on ac due to hx of bleeding Plan: -tele monitoring -hold home BB one more day as newly off levo  RA Plan: -steroids as above -hold home meds for now  Peripheral neuropathy RLS Plan: -restart home gabapentin   Best Practice (right click and "Reselect all SmartList  Selections" daily)   Diet/type: clear liquids DVT prophylaxis: prophylactic heparin  GI prophylaxis: PPI Lines: N/A Foley:  Yes, and it is still needed Code Status:  limited Last date of multidisciplinary goals of care discussion - updated son, Minerva Areola this morning 10/3 on status, plan of care   Labs   CBC: Recent Labs  Lab 11/05/22 1059 11/05/22 1359 11/06/22 0331  WBC 11.9*  --  17.3*  NEUTROABS 10.9*  --   --   HGB 10.9* 9.2* 8.4*  HCT 36.1 27.0* 27.3*  MCV 103.7*  --  98.6  PLT 333  --  154    Basic Metabolic Panel: Recent Labs  Lab 11/05/22 1059 11/05/22 1359 11/06/22 0331  NA 138 138 138  K 3.7 4.0 3.9  CL 100  --  100  CO2 29  --  27  GLUCOSE 80  --  122*  BUN 11  --  18  CREATININE 1.07*  --  1.12*  CALCIUM 9.2  --  8.2*   GFR: Estimated Creatinine Clearance: 29.3 mL/min (A) (by C-G formula based on SCr of 1.12 mg/dL (H)). Recent Labs  Lab 11/05/22 1059 11/05/22 1108 11/05/22 1447 11/05/22 1720 11/06/22 0331  PROCALCITON  --   --  56.21  --   --   WBC 11.9*  --   --   --  17.3*  LATICACIDVEN  --  2.3* 1.3 1.2  --     Liver Function Tests: Recent Labs  Lab 11/05/22 1059  AST 28  ALT 13  ALKPHOS 107  BILITOT 0.5  PROT 6.7  ALBUMIN 3.1*   No results for input(s): "LIPASE", "AMYLASE" in the last 168 hours. No results for input(s): "AMMONIA" in the last 168 hours.  ABG    Component Value Date/Time   PHART 7.38 11/06/2022 0534   PCO2ART 51 (H) 11/06/2022 0534   PO2ART 129 (H) 11/06/2022 0534   HCO3 30.2 (H) 11/06/2022 0534   TCO2 32 11/05/2022 1359   O2SAT 98.2 11/06/2022 0534     Coagulation Profile: Recent Labs  Lab 11/05/22 1059  INR 1.1    Cardiac Enzymes: No results for input(s): "CKTOTAL", "CKMB", "CKMBINDEX", "TROPONINI" in the last 168 hours.  HbA1C: Hgb A1c MFr Bld  Date/Time Value Ref Range Status  08/20/2022 01:07 PM 4.9 4.8 - 5.6 % Final    Comment:    (NOTE)         Prediabetes: 5.7 - 6.4         Diabetes:  >6.4         Glycemic control for adults with diabetes: <7.0     CBG: Recent Labs  Lab 11/05/22 1513 11/05/22 1620 11/05/22 1935 11/05/22 2322 11/06/22 0347  GLUCAP 86 104* 111* 122* 124*    Review of Systems:   As per HPI  Past Medical History:  She,  has a past medical history of Abnormal nuclear stress test, AF (paroxysmal atrial fibrillation) (HCC), Age-related osteoporosis without current pathological fracture (05/16/2016), Alcoholism (HCC) (10/21/2018), Allergic rhinitis (07/17/2015), At high risk for falls (07/14/2017), Atherosclerotic heart disease of native coronary artery  without angina pectoris (04/09/2021), 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HCC) (07/14/2017), Benign paroxysmal positional vertigo due to bilateral vestibular disorder (02/09/2020), Cancer of ascending colon [facility]), Carcinoma of ascending colon (HCC), Cat allergies, Centrilobular emphysema (HCC) (05/16/2016), Chronic anemia (04/09/2021), Chronic atrial fibrillation, unspecified (HCC) (10/04/2020), Chronic obstructive pulmonary disease (HCC) (04/09/2021), Chronic pain (04/09/2021), CKD (chronic kidney disease) stage 3, GFR 30-59 ml/min (HCC) (01/04/2018), COPD (chronic obstructive pulmonary disease) (HCC), Cysts of both ovaries (11/24/2019), Dyspnea, Early satiety (04/14/2019), Essential hypertension (08/22/2010), Gastro-esophageal reflux disease without esophagitis (07/14/2017), Gastrointestinal hemorrhage, History of pulmonary embolus (PE) (07/14/2017), History of radiation therapy, History of radiation therapy, Hypertension, Hypothyroidism (04/09/2021), Idiopathic peripheral neuropathy (02/23/2020), Iron deficiency anemia due to chronic blood loss (10/27/2018), 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 (02/27/2017), Lung cancer, lower lobe (HCC) (09/24/2020), Lung nodule, solitary (08/16/2020), Malaise and fatigue (07/14/2017), Malignant neoplasm of unspecified part of left bronchus or lung (HCC) (04/09/2021),  Malignant tumor of ascending colon (HCC) (04/09/2021), Mild CAD (02/02/2019), Mild episode of recurrent major depressive disorder (HCC) (07/14/2017), Mixed hyperlipidemia (07/14/2017), Multiple lung nodules on CT (05/16/2016), Osteoporosis, Other cirrhosis of liver (HCC) (11/24/2019), Other vitamin B12 deficiency anemias (04/09/2021), Paroxysmal atrial fibrillation (HCC) (04/14/2019), Prediabetes (07/14/2017), Pulmonary embolism (HCC) (2017), Rheumatoid arthritis (HCC) (09/20/2010), Rheumatoid arthritis(714.0), Schatzki's ring, Senile purpura (HCC) (01/04/2018), Skin rash (07/14/2017), Status post thoracentesis, Symptomatic anemia (07/25/2020), Type 2 diabetes mellitus without complications (HCC) (04/09/2021), Vitamin B12 deficiency (07/14/2017), and Vitamin D deficiency (04/09/2021).   Surgical History:   Past Surgical History:  Procedure Laterality Date   BIOPSY  07/27/2020   Procedure: BIOPSY;  Surgeon: Iva Boop, MD;  Location: [facility] ENDOSCOPY;  Service: Endoscopy;;   BRONCHIAL BIOPSY  09/11/2020   Procedure: BRONCHIAL BIOPSIES;  Surgeon: Josephine Igo, DO;  Location: [facility] ENDOSCOPY;  Service: Pulmonary;;   BRONCHIAL BRUSHINGS  09/11/2020   Procedure: BRONCHIAL BRUSHINGS;  Surgeon: Josephine Igo, DO;  Location: [facility] ENDOSCOPY;  Service: Pulmonary;;   BRONCHIAL NEEDLE ASPIRATION BIOPSY  09/11/2020   Procedure: BRONCHIAL NEEDLE ASPIRATION BIOPSIES;  Surgeon: Josephine Igo, DO;  Location: [facility] ENDOSCOPY;  Service: Pulmonary;;   BRONCHIAL WASHINGS  09/11/2020   Procedure: BRONCHIAL WASHINGS;  Surgeon: Josephine Igo, DO;  Location: [facility] ENDOSCOPY;  Service: Pulmonary;;   COLONOSCOPY N/A 07/27/2020   Procedure: COLONOSCOPY;  Surgeon: Iva Boop, MD;  Location: [facility] ENDOSCOPY;  Service: Endoscopy;  Laterality: N/A;   CYSTECTOMY Right    breast   ESOPHAGOGASTRODUODENOSCOPY (EGD) WITH PROPOFOL N/A 07/27/2020   Procedure: ESOPHAGOGASTRODUODENOSCOPY (EGD) WITH PROPOFOL;  Surgeon: Iva Boop, MD;   Location: [facility] ENDOSCOPY;  Service: Endoscopy;  Laterality: N/A;   FIDUCIAL MARKER PLACEMENT  09/11/2020   Procedure: FIDUCIAL MARKER PLACEMENT;  Surgeon: Josephine Igo, DO;  Location: [facility] ENDOSCOPY;  Service: Pulmonary;;   LAPAROSCOPIC RIGHT HEMI COLECTOMY Right 07/31/2020   Procedure: LAPAROSCOPIC ASSISSTED  RIGHT HEMI COLECTOMY;  Surgeon: Manus Rudd, MD;  Location: [facility] OR;  Service: General;  Laterality: Right;   LEFT HEART CATH AND CORONARY ANGIOGRAPHY N/A 01/05/2019   Procedure: LEFT HEART CATH AND CORONARY ANGIOGRAPHY;  Surgeon: Corky Crafts, MD;  Location: [facility] INVASIVE CV LAB;  Service: Cardiovascular;  Laterality: N/A;   SUBMUCOSAL TATTOO INJECTION  07/27/2020   Procedure: SUBMUCOSAL TATTOO INJECTION;  Surgeon: Iva Boop, MD;  Location: [facility] ENDOSCOPY;  Service: Endoscopy;;   VIDEO BRONCHOSCOPY WITH ENDOBRONCHIAL NAVIGATION Left 09/11/2020   Procedure: VIDEO BRONCHOSCOPY WITH ENDOBRONCHIAL NAVIGATION;  Surgeon: Josephine Igo, DO;  Location: [facility] ENDOSCOPY;  Service: Pulmonary;  Laterality:  Left;  ION   VIDEO BRONCHOSCOPY WITH RADIAL ENDOBRONCHIAL ULTRASOUND  09/11/2020   Procedure: RADIAL ENDOBRONCHIAL ULTRASOUND;  Surgeon: Josephine Igo, DO;  Location: [facility] ENDOSCOPY;  Service: Pulmonary;;     Social History:   reports that she quit smoking about 21 years ago. Her smoking use included cigarettes. She started smoking about 61 years ago. She has a 40 pack-year smoking history. She has never used smokeless tobacco. She reports current alcohol use of about 2.0 standard drinks of alcohol per week. She reports that she does not use drugs.   Family History:  Her family history includes Breast cancer in her maternal aunt; Clotting disorder in her mother; Emphysema in her paternal grandmother and sister; Heart disease in her father and sister; Sleep apnea in her sister; Stroke in her mother.   Allergies Allergies  Allergen Reactions   Other Shortness Of Breath    Cats   Oxycodone  Other (See Comments)    hallucinations     Home Medications  Prior to Admission medications   Medication Sig Start Date End Date Taking? Authorizing Provider  acetaminophen (TYLENOL) 325 MG tablet Take 325 mg by mouth every 6 (six) hours as needed for moderate pain or headache.   Yes [provider]  albuterol (VENTOLIN HFA) 108 (90 Base) MCG/ACT inhaler Inhale 2 puffs into the lungs every 4 (four) hours as needed for shortness of breath (shortness of breath (related to COPD)).   Yes [provider]  amLODipine (NORVASC) 5 MG tablet Take 1 tablet (5 mg total) by mouth daily. Patient needs appointment for further refills. 1 st attempt 10/02/20  Yes Tobb, Kardie, DO  aspirin EC 81 MG tablet Take 81 mg by mouth daily.   Yes [provider]  atorvastatin (LIPITOR) 20 MG tablet Take 1 tablet (20 mg total) by mouth daily. Patient needs to keep appointment for further refills. 1 st attempt Patient taking differently: Take 20 mg by mouth every evening. Patient needs to keep appointment for further refills. 1 st attempt 10/05/20  Yes Tobb, Kardie, DO  Budeson-Glycopyrrol-Formoterol (BREZTRI AEROSPHERE) 160-9-4.8 MCG/ACT AERO Inhale 2 puffs into the lungs 2 (two) times daily. 01/19/20  Yes Icard, Bradley L, DO  carvedilol (COREG) 6.25 MG tablet Take 1 tablet (6.25 mg total) by mouth 2 (two) times daily with a meal. 08/29/22 11/05/22 Yes Dahal, Melina Schools, MD  cholecalciferol (VITAMIN D3) 25 MCG (1000 UNIT) tablet Take 1,000 Units by mouth in the morning and at bedtime.   Yes [provider]  Ferrous Sulfate (IRON) 325 (65 Fe) MG TABS Take 1 tablet by mouth daily. 10/08/21  Yes [provider]  fexofenadine (ALLEGRA) 180 MG tablet Take 180 mg by mouth daily.   Yes [provider]  gabapentin (NEURONTIN) 100 MG capsule Take 100 mg by mouth 2 (two) times daily. 10/01/21  Yes [provider]  hydroxychloroquine (PLAQUENIL) 200 MG tablet Take 200 mg by mouth  daily. 09/17/15  Yes [provider]  leflunomide (ARAVA) 10 MG tablet Take 10 mg by mouth daily.   Yes [provider]  lidocaine (ASPERCREME LIDOCAINE) 4 % Place 1 patch onto the skin daily as needed (muscle pain).   Yes [provider]  loperamide (IMODIUM A-D) 2 MG tablet Take 4 mg by mouth as needed for diarrhea or loose stools (MAX 10 PILLS/24 HRS.).   Yes [provider]  meclizine (ANTIVERT) 12.5 MG tablet Take 12.5 mg by mouth 2 (two) times daily as needed for dizziness.  05/20/22  Yes [provider]  mirtazapine (REMERON) 7.5 MG tablet Take 7.5 mg by mouth at bedtime.   Yes [provider]  montelukast (SINGULAIR) 10 MG tablet Take 1 tablet (10 mg total) by mouth at bedtime. 10/21/20  Yes Icard, Rachel Bo, DO  omeprazole (PRILOSEC) 20 MG capsule Take 1 capsule (20 mg total) by mouth daily. 07/11/20  Yes Icard, Bradley L, DO  ondansetron (ZOFRAN-ODT) 4 MG disintegrating tablet Take 1 tablet (4 mg total) by mouth every 8 (eight) hours as needed for nausea or vomiting. 08/05/20  Yes Valetta Close, MD  OYSTERCAL-D 500-10 MG-MCG TABS Take 1 tablet by mouth daily. 03/06/22  Yes [provider]  predniSONE (DELTASONE) 5 MG tablet Take 1 tablet (5 mg total) by mouth daily with breakfast. 09/09/22  Yes Dahal, Melina Schools, MD  rOPINIRole (REQUIP) 0.5 MG tablet Take 0.5 mg by mouth at bedtime.   Yes [provider]  senna-docusate (SENOKOT-S) 8.6-50 MG tablet Take 1 tablet by mouth at bedtime. 08/29/22  Yes Dahal, Melina Schools, MD  Sertraline HCl 150 MG CAPS Take 1 capsule by mouth daily. 04/09/22  Yes [provider]  tizanidine (ZANAFLEX) 2 MG capsule Take 2 mg by mouth 3 (three) times daily.   Yes [provider]  traMADol (ULTRAM) 50 MG tablet Take 1 tablet (50 mg total) by mouth 3 (three) times daily as needed for moderate pain (pain). Patient taking differently: Take 50 mg by mouth in the morning, at noon, and at bedtime.  06/18/22  Yes Burnadette Pop, MD  valsartan (DIOVAN) 320 MG tablet Take 320 mg by mouth in the morning.   Yes [provider]  vitamin B-12 (CYANOCOBALAMIN) 1000 MCG tablet Take 1,000 mcg by mouth daily.    Yes [provider]     Critical care time: 75    [facility] 11/06/2022, 7:34 AM  Please see [email or web address] for pager details.  From 7A-7P if no response, please call [phone]. After hours, please call ELink [phone].

## 2022-11-07 DIAGNOSIS — A419 Sepsis, unspecified organism: Secondary | ICD-10-CM | POA: Diagnosis not present

## 2022-11-07 DIAGNOSIS — I480 Paroxysmal atrial fibrillation: Secondary | ICD-10-CM | POA: Diagnosis not present

## 2022-11-07 DIAGNOSIS — R6521 Severe sepsis with septic shock: Secondary | ICD-10-CM | POA: Diagnosis not present

## 2022-11-07 LAB — LEGIONELLA PNEUMOPHILA SEROGP 1 UR AG: L. pneumophila Serogp 1 Ur Ag: NEGATIVE

## 2022-11-07 LAB — GLUCOSE, CAPILLARY
Glucose-Capillary: 100 mg/dL — ABNORMAL HIGH (ref 70–99)
Glucose-Capillary: 121 mg/dL — ABNORMAL HIGH (ref 70–99)
Glucose-Capillary: 130 mg/dL — ABNORMAL HIGH (ref 70–99)
Glucose-Capillary: 143 mg/dL — ABNORMAL HIGH (ref 70–99)
Glucose-Capillary: 151 mg/dL — ABNORMAL HIGH (ref 70–99)
Glucose-Capillary: 93 mg/dL (ref 70–99)

## 2022-11-07 MED ORDER — HYDRALAZINE HCL 20 MG/ML IJ SOLN: 5.0000 mg | INTRAMUSCULAR | Status: DC | PRN

## 2022-11-07 MED ORDER — CARVEDILOL 6.25 MG PO TABS
6.2500 mg | ORAL_TABLET | Freq: Two times a day (BID) | ORAL | Status: DC
  Administered 2022-11-07 – 2022-11-11 (×8): 6.25 mg via ORAL
  Filled 2022-11-07 (×8): qty 1

## 2022-11-07 MED ORDER — INSULIN ASPART 100 UNIT/ML IJ SOLN: 0.0000 [IU] | Freq: Every day | INTRAMUSCULAR | Status: DC

## 2022-11-07 MED ORDER — MIRTAZAPINE 15 MG PO TABS
7.5000 mg | ORAL_TABLET | Freq: Every day | ORAL | Status: DC
  Administered 2022-11-07 – 2022-11-10 (×4): 7.5 mg via ORAL
  Filled 2022-11-07 (×4): qty 1

## 2022-11-07 MED ORDER — INSULIN ASPART 100 UNIT/ML IJ SOLN
0.0000 [IU] | Freq: Three times a day (TID) | INTRAMUSCULAR | Status: DC
  Administered 2022-11-07 – 2022-11-11 (×6): 2 [IU] via SUBCUTANEOUS

## 2022-11-07 MED ORDER — ROPINIROLE HCL 1 MG PO TABS
0.5000 mg | ORAL_TABLET | Freq: Every day | ORAL | Status: DC
  Administered 2022-11-07 – 2022-11-10 (×4): 0.5 mg via ORAL
  Filled 2022-11-07 (×4): qty 1

## 2022-11-07 NOTE — Plan of Care (Signed)
Problem: Education: Goal: Ability to describe self-care measures that may prevent or decrease complications (Diabetes Survival Skills Education) will improve Outcome: Progressing Goal: Individualized Educational Video(s) Outcome: Progressing   Problem: Coping: Goal: Ability to adjust to condition or change in health will improve Outcome: Progressing   Problem: Fluid Volume: Goal: Ability to maintain a balanced intake and output will improve Outcome: Progressing   Problem: Health Behavior/Discharge Planning: Goal: Ability to identify and utilize available resources and services will improve Outcome: Progressing Goal: Ability to manage health-related needs will improve Outcome: Progressing   Problem: Metabolic: Goal: Ability to maintain appropriate glucose levels will improve Outcome: Progressing   Problem: Nutritional: Goal: Maintenance of adequate nutrition will improve Outcome: Progressing Goal: Progress toward achieving an optimal weight will improve Outcome: Progressing   Problem: Skin Integrity: Goal: Risk for impaired skin integrity will decrease Outcome: Progressing   Problem: Tissue Perfusion: Goal: Adequacy of tissue perfusion will improve Outcome: Progressing   Problem: Education: Goal: Knowledge of General Education information will improve Description: Including pain rating scale, medication(s)/side effects and non-pharmacologic comfort measures Outcome: Progressing   Problem: Health Behavior/Discharge Planning: Goal: Ability to manage health-related needs will improve Outcome: Progressing   Problem: Clinical Measurements: Goal: Ability to maintain clinical measurements within normal limits will improve Outcome: Progressing Goal: Will remain free from infection Outcome: Progressing Goal: Diagnostic test results will improve Outcome: Progressing Goal: Respiratory complications will improve Outcome: Progressing Goal: Cardiovascular complication will  be avoided Outcome: Progressing   Problem: Activity: Goal: Risk for activity intolerance will decrease Outcome: Progressing   Problem: Nutrition: Goal: Adequate nutrition will be maintained Outcome: Progressing   Problem: Coping: Goal: Level of anxiety will decrease Outcome: Progressing   Problem: Elimination: Goal: Will not experience complications related to bowel motility Outcome: Progressing Goal: Will not experience complications related to urinary retention Outcome: Progressing   Problem: Pain Managment: Goal: General experience of comfort will improve Outcome: Progressing   Problem: Safety: Goal: Ability to remain free from injury will improve Outcome: Progressing   Problem: Skin Integrity: Goal: Risk for impaired skin integrity will decrease Outcome: Progressing   Problem: Education: Goal: Ability to describe self-care measures that may prevent or decrease complications (Diabetes Survival Skills Education) will improve Outcome: Progressing   Problem: Education: Goal: Individualized Educational Video(s) Outcome: Progressing   Problem: Coping: Goal: Ability to adjust to condition or change in health will improve Outcome: Progressing   Problem: Fluid Volume: Goal: Ability to maintain a balanced intake and output will improve Outcome: Progressing   Problem: Health Behavior/Discharge Planning: Goal: Ability to identify and utilize available resources and services will improve Outcome: Progressing   Problem: Health Behavior/Discharge Planning: Goal: Ability to manage health-related needs will improve Outcome: Progressing   Problem: Metabolic: Goal: Ability to maintain appropriate glucose levels will improve Outcome: Progressing   Problem: Nutritional: Goal: Maintenance of adequate nutrition will improve Outcome: Progressing   Problem: Nutritional: Goal: Maintenance of adequate nutrition will improve Outcome: Progressing   Problem:  Nutritional: Goal: Progress toward achieving an optimal weight will improve Outcome: Progressing   Problem: Skin Integrity: Goal: Risk for impaired skin integrity will decrease Outcome: Progressing   Problem: Tissue Perfusion: Goal: Adequacy of tissue perfusion will improve Outcome: Progressing   Problem: Education: Goal: Knowledge of General Education information will improve Description: Including pain rating scale, medication(s)/side effects and non-pharmacologic comfort measures Outcome: Progressing   Problem: Education: Goal: Knowledge of General Education information will improve Description: Including pain rating scale, medication(s)/side effects and non-pharmacologic comfort measures  Outcome: Progressing   Problem: Health Behavior/Discharge Planning: Goal: Ability to manage health-related needs will improve Outcome: Progressing   Problem: Health Behavior/Discharge Planning: Goal: Ability to manage health-related needs will improve Outcome: Progressing   Problem: Clinical Measurements: Goal: Ability to maintain clinical measurements within normal limits will improve Outcome: Progressing   Problem: Clinical Measurements: Goal: Will remain free from infection Outcome: Progressing   Problem: Clinical Measurements: Goal: Diagnostic test results will improve Outcome: Progressing   Problem: Clinical Measurements: Goal: Respiratory complications will improve Outcome: Progressing   Problem: Clinical Measurements: Goal: Cardiovascular complication will be avoided Outcome: Progressing   Problem: Activity: Goal: Risk for activity intolerance will decrease Outcome: Progressing   Problem: Activity: Goal: Risk for activity intolerance will decrease Outcome: Progressing   Problem: Nutrition: Goal: Adequate nutrition will be maintained Outcome: Progressing   Problem: Nutrition: Goal: Adequate nutrition will be maintained Outcome: Progressing   Problem:  Coping: Goal: Level of anxiety will decrease Outcome: Progressing   Problem: Coping: Goal: Level of anxiety will decrease Outcome: Progressing   Problem: Elimination: Goal: Will not experience complications related to bowel motility Outcome: Progressing   Problem: Elimination: Goal: Will not experience complications related to urinary retention Outcome: Progressing   Problem: Pain Managment: Goal: General experience of comfort will improve Outcome: Progressing   Problem: Skin Integrity: Goal: Risk for impaired skin integrity will decrease Outcome: Progressing   Problem: Safety: Goal: Ability to remain free from injury will improve Outcome: Progressing   Problem: Safety: Goal: Ability to remain free from injury will improve Outcome: Progressing

## 2022-11-07 NOTE — Evaluation (Signed)
Occupational Therapy Evaluation Patient Details Name: Desiree [name] MRN: [id number] DOB: 26-Dec-1945 Today's Date: 11/07/2022   History of Present Illness Patient is a 77 year old female with acute on chronic respiratory failure due to new bilateral community-acquired pneumonia, associated septic shock. History of  non-small cell lung cancer, rheumatoid arthritis, A-fib, diabetes, chronic hypoxemic respiratory failure on 3-4 L/min.   Clinical Impression   Pt admitted for above, she is very sweet and jocile. Presents with impaired balance and needs Mod A for pivot transfers as she has a hard time gathering her feet underneath her to safely produce steps. Pt also noted to have an elevated HR with mobility, unsure of her normal range. She needs Mod to Max A for LB ADLs, and setup to min A for UB ADLs. Pt would benefit from continued acute skilled OT services to address deficits and help transition to next level of care. Patient would benefit from post acute skilled rehab facility with <3 hours of therapy and 24/7 support        If plan is discharge home, recommend the following: A lot of help with walking and/or transfers;A lot of help with bathing/dressing/bathroom;Assistance with cooking/housework    Functional Status Assessment  Patient has had a recent decline in their functional status and demonstrates the ability to make significant improvements in function in a reasonable and predictable amount of time.  Equipment Recommendations  None recommended by OT (TBD at next level of care)    Recommendations for Other Services       Precautions / Restrictions Precautions Precautions: Fall Precaution Comments: fragile Restrictions Weight Bearing Restrictions: No      Mobility Bed Mobility Overal bed mobility: Needs Assistance Bed Mobility: Supine to Sit, Sit to Supine     Supine to sit: Mod assist Sit to supine: Max assist   General bed mobility comments: Pt noted to have a  posterior lean before returning to supine. OT repositioned pt in bed to prevent pt from sliding off    Transfers Overall transfer level: Needs assistance Equipment used: 1 person hand held assist Transfers: Sit to/from Stand, Bed to chair/wheelchair/BSC Sit to Stand: Mod assist Stand pivot transfers: Mod assist         General transfer comment: attempted RW but it was such poor use that HHA was preferred      Balance Overall balance assessment: Needs assistance Sitting-balance support: Feet supported Sitting balance-Leahy Scale: Fair   Postural control: Right lateral lean Standing balance support: Bilateral upper extremity supported, During functional activity Standing balance-Leahy Scale: Poor                             ADL either performed or assessed with clinical judgement   ADL Overall ADL's : Needs assistance/impaired Eating/Feeding: Independent;Sitting   Grooming: Bed level;Set up   Upper Body Bathing: Set up;Sitting   Lower Body Bathing: Maximal assistance;Sitting/lateral leans   Upper Body Dressing : Minimal assistance;Sitting   Lower Body Dressing: Maximal assistance;Sitting/lateral leans;Sit to/from stand   Toilet Transfer: Moderate assistance;Stand-pivot;BSC/3in1 Statistician Details (indicate cue type and reason): better with HHA Toileting- Clothing Manipulation and Hygiene: Maximal assistance;Sit to/from stand               Vision         Perception         Praxis         Pertinent Vitals/Pain Pain Assessment Pain Assessment: Faces Faces Pain  Scale: Hurts little more Pain Location: back when having to tug on gait belt Pain Descriptors / Indicators: Aching, Discomfort Pain Intervention(s): Limited activity within patient's tolerance, Repositioned, Monitored during session     Extremity/Trunk Assessment Upper Extremity Assessment Upper Extremity Assessment: Generalized weakness   Lower Extremity Assessment Lower  Extremity Assessment: Generalized weakness   Cervical / Trunk Assessment Cervical / Trunk Assessment: Kyphotic   Communication Communication Communication: No apparent difficulties Cueing Techniques: Verbal cues   Cognition Arousal: Alert Behavior During Therapy: WFL for tasks assessed/performed Overall Cognitive Status: No family/caregiver present to determine baseline cognitive functioning                                       General Comments  HR up to 137 with transfers, resting in the 114-121 bpm range at rest. Upon entry the tele monitored was noted to say "sleep apnea" with HR in the 120s.    Exercises     Shoulder Instructions      Home Living Family/patient expects to be discharged to:: Assisted living                             Home Equipment: Rollator (4 wheels);Shower seat   Additional Comments: 3-4 L 02 at baseline. reports 1.5 falls in the last year due to tripping over the feet.      Prior Functioning/Environment Prior Level of Function : Needs assist;History of Falls (last six months)             Mobility Comments: patient reports Mod I with mobility using rollator for ambulation, short distance ambulation ADLs Comments: patient reports she can bath and dress herself but can have assistance if needed at her facility. medication and meal assistance        OT Problem List: Impaired balance (sitting and/or standing);Decreased activity tolerance;Decreased strength      OT Treatment/Interventions: Self-care/ADL training;Balance training;Therapeutic exercise;Therapeutic activities;Patient/family education    OT Goals(Current goals can be found in the care plan section) Acute Rehab OT Goals Patient Stated Goal: To feel better OT Goal Formulation: With patient Time For Goal Achievement: 11/21/22 Potential to Achieve Goals: Good ADL Goals Pt Will Perform Grooming: with set-up;sitting Pt Will Perform Lower Body Dressing: with  set-up;with supervision;sitting/lateral leans Pt Will Transfer to Toilet: bedside commode;stand pivot transfer;with contact guard assist Pt/caregiver will Perform Home Exercise Program: Increased strength;With written HEP provided;With Supervision  OT Frequency: Min 1X/week    Co-evaluation              AM-PAC OT "6 Clicks" Daily Activity     Outcome Measure Help from another person eating meals?: None Help from another person taking care of personal grooming?: A Little Help from another person toileting, which includes using toliet, bedpan, or urinal?: A Lot Help from another person bathing (including washing, rinsing, drying)?: A Lot Help from another person to put on and taking off regular upper body clothing?: A Little Help from another person to put on and taking off regular lower body clothing?: A Lot 6 Click Score: 16   End of Session Equipment Utilized During Treatment: Gait belt Nurse Communication: Mobility status  Activity Tolerance: Patient tolerated treatment well Patient left: in bed;with call bell/phone within reach;with bed alarm set;with nursing/sitter in room  OT Visit Diagnosis: Unsteadiness on feet (R26.81);Other abnormalities of gait and mobility (R26.89);Muscle weakness (  generalized) (M62.81)                Time: 1610-9604 OT Time Calculation (min): 32 min Charges:  OT General Charges $OT Visit: 1 Visit OT Evaluation $OT Eval Moderate Complexity: 1 Mod OT Treatments $Self Care/Home Management : 8-22 mins  11/07/2022  AB, OTR/L  Acute Rehabilitation Services  Office: [phone]   Tristan Schroeder 11/07/2022, 4:35 PM

## 2022-11-07 NOTE — TOC Initial Note (Signed)
Transition of Care [facility]) - Initial/Assessment Note    Patient Details  Name: Desiree [name] MRN: [id number] Date of Birth: [birth year]  [facility] CM/SW Contact:    Desiree Coots, Desiree [name] Phone Number: 11/07/2022, 4:56 PM  Clinical Narrative:                  Spoke with Health Power of Attorney/Daughter Desiree [name] regarding SNF placement upon discharge. Desiree [name] expressed interest in SNF, specifically [facility], but consented for this MSW to research other SNF locations in [place] area. Discussed insurance process with Desiree [name], as well.  Expected Discharge Plan: Skilled Nursing Facility Barriers to Discharge: Continued Medical Work up, SNF Pending bed offer, Insurance Authorization   Patient Goals and CMS Choice Patient states their goals for this hospitalization and ongoing recovery are:: SNF/[place] CMS Medicare.gov Compare Post Acute Care list provided to:: Patient Represenative (must comment) Choice offered to / list presented to : Adult Children [place] ownership interest in [facility].provided to:: Adult Children    Expected Discharge Plan and Services In-house Referral: Clinical Social Work Discharge Planning Services: CM Consult Post Acute Care Choice: Skilled Nursing Facility Living arrangements for the past 2 months: Assisted Living Facility                                      Prior Living Arrangements/Services Living arrangements for the past 2 months: Assisted Living Facility Lives with:: Facility Resident Patient language and need for interpreter reviewed:: No Do you feel safe going back to the place where you live?: Yes      Need for Family Participation in Patient Care: Yes (Comment) Chartered loss adjuster (Daughter) Health Power of [place]) Care giver support system in place?: Yes (comment)   Criminal Activity/Legal Involvement Pertinent to Current Situation/Hospitalization: No - Comment as needed  Activities  of Daily Living      Permission Sought/Granted Permission sought to share information with : Facility Industrial/product designer granted to share information with : Yes, Verbal Permission Granted  Share Information with NAME: Desiree [name]; [phone]  Permission granted to share info w AGENCY: SNF  Permission granted to share info w Relationship: Daughter  Permission granted to share info w Contact Information: [phone]  Emotional Assessment Appearance:: Developmentally appropriate, Appears stated age   Affect (typically observed): Appropriate, Pleasant Orientation: : Oriented to Self, Oriented to Place Alcohol / Substance Use: Not Applicable Psych Involvement: No (comment)  Admission diagnosis:  Respiratory distress [R06.03] Septic shock (HCC) [A41.9, R65.21] Patient Active Problem List   Diagnosis Date Noted   Septic shock (HCC) 11/05/2022   Malnutrition of moderate degree 08/26/2022   Chronic respiratory failure with hypoxia (HCC) 08/25/2022   CAP (community acquired pneumonia) 08/20/2022   Hypomagnesemia 08/20/2022   UTI (urinary tract infection) 06/15/2022   AF (paroxysmal atrial fibrillation) (HCC) 10/10/2021   History of radiation therapy 04/09/2021   Chronic pain 04/09/2021   Hypothyroidism 04/09/2021   Type 2 diabetes mellitus without complications (HCC) 04/09/2021   Vitamin D deficiency 04/09/2021   Malignant tumor of ascending colon (HCC) 04/09/2021   Chronic obstructive pulmonary disease (HCC) 04/09/2021   Malignant neoplasm of unspecified part of left bronchus or lung (HCC) 04/09/2021   Atherosclerotic heart disease of native coronary artery without angina pectoris 04/09/2021   Chronic anemia 04/09/2021   Other vitamin B12 deficiency anemias 04/09/2021   Chronic atrial fibrillation, unspecified (HCC) 10/04/2020  Carcinoma of ascending colon (HCC) 10/04/2020   Dyspnea 10/04/2020   Lung cancer, lower lobe (HCC) 09/24/2020   Lung nodule, solitary  08/16/2020   Status post thoracentesis    Schatzki's ring    Cancer of ascending colon (HCC)    Gastrointestinal hemorrhage    Cat allergies 07/26/2020   Symptomatic anemia 07/25/2020   Idiopathic peripheral neuropathy 02/23/2020   Benign paroxysmal positional vertigo due to bilateral vestibular disorder 02/09/2020   Cysts of both ovaries 11/24/2019   Other cirrhosis of liver (HCC) 11/24/2019   Early satiety 04/14/2019   Paroxysmal atrial fibrillation (HCC) 04/14/2019   Mild CAD 02/02/2019   Abnormal nuclear stress test    Iron deficiency anemia due to chronic blood loss 10/27/2018   Alcoholism (HCC) 10/21/2018   CKD (chronic kidney disease) stage 3, GFR 30-59 ml/min (HCC) 01/04/2018   Senile purpura (HCC) 01/04/2018   At high risk for falls 07/14/2017   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HCC) 07/14/2017   Gastro-esophageal reflux disease without esophagitis 07/14/2017   History of pulmonary embolus (PE) 07/14/2017   Malaise and fatigue 07/14/2017   Mild episode of recurrent major depressive disorder (HCC) 07/14/2017   Mixed hyperlipidemia 07/14/2017   Prediabetes 07/14/2017   Skin rash 07/14/2017   Vitamin B12 deficiency 07/14/2017   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   Multiple lung nodules on CT 05/16/2016   Osteoporosis 05/16/2016   Centrilobular emphysema (HCC) 05/16/2016   Age-related osteoporosis without current pathological fracture 05/16/2016   Allergic rhinitis 07/17/2015   Pulmonary embolism (HCC) 11/23/2014   Rheumatoid arthritis (HCC) 09/20/2010   Rheumatoid arthritis (HCC) 09/20/2010   COPD (chronic obstructive pulmonary disease) (HCC) 08/22/2010   Hypertension 08/22/2010   Essential hypertension 08/22/2010   PCP:  Patient, No Pcp Per Pharmacy:   [facility] [place], [place] - [address] [facility] [address] [place] [address] Phone: [phone] Fax: [phone]  [place] [facility]  Pharmacy [address] [place] [address] Phone: [phone] Fax: [phone]  [id number] [place], [place] - [address] AT [facility] OF [address] [place] [address] Phone: [phone] Fax: [phone]     Social Determinants of Health (SDOH) Social History: SDOH Screenings   Food Insecurity: No Food Insecurity (08/25/2022)  Housing: Low Risk  (08/25/2022)  Transportation Needs: No Transportation Needs (08/25/2022)  Utilities: Not At Risk (08/25/2022)  Financial Resource Strain: Low Risk  (10/26/2020)  Social Connections: Socially Isolated (10/26/2020)  Tobacco Use: Medium Risk (11/05/2022)   SDOH Interventions:     Readmission Risk Interventions    11/06/2022   12:45 PM 08/27/2022   12:09 PM 08/26/2022    9:53 AM  Readmission Risk Prevention Plan  Transportation Screening Complete Complete   Medication Review (RN Care Manager) Referral to Pharmacy Complete Complete  PCP or Specialist appointment within 3-5 days of discharge Complete Complete   HRI or Home Care Consult Complete Complete Complete  SW Recovery Care/Counseling Consult Complete Complete   Palliative Care Screening Not Applicable Not Applicable Not Applicable  Skilled Nursing Facility Not Applicable Not Applicable Not Applicable

## 2022-11-07 NOTE — Plan of Care (Signed)

## 2022-11-07 NOTE — [place] FL2 (Signed)
[place] MEDICAID [id number] LEVEL OF CARE FORM     IDENTIFICATION  Patient Name: Desiree [name] Birthdate: December 14, 1945 Sex: female Admission Date (Current Location): 11/05/2022  [place] and [place] Number:  [employer] and Address:  [facility] [place], [address] [place], [place] [address]      Provider Number: [id number]  Attending Physician Name and Address:  Elgergawy, Leana Roe, MD  Relative Name and Phone Number:  Aneshia Vilas;  [phone]    Current Level of Care: Hospital Recommended Level of Care: Skilled Nursing Facility Prior Approval Number:    Date Approved/Denied:   PASRR Number: [id number]  Discharge Plan: SNF    Current Diagnoses: Patient Active Problem List   Diagnosis Date Noted   Septic shock (HCC) 11/05/2022   Malnutrition of moderate degree 08/26/2022   Chronic respiratory failure with hypoxia (HCC) 08/25/2022   CAP (community acquired pneumonia) 08/20/2022   Hypomagnesemia 08/20/2022   UTI (urinary tract infection) 06/15/2022   AF (paroxysmal atrial fibrillation) (HCC) 10/10/2021   History of radiation therapy 04/09/2021   Chronic pain 04/09/2021   Hypothyroidism 04/09/2021   Type 2 diabetes mellitus without complications (HCC) 04/09/2021   Vitamin D deficiency 04/09/2021   Malignant tumor of ascending colon (HCC) 04/09/2021   Chronic obstructive pulmonary disease (HCC) 04/09/2021   Malignant neoplasm of unspecified part of left bronchus or lung (HCC) 04/09/2021   Atherosclerotic heart disease of native coronary artery without angina pectoris 04/09/2021   Chronic anemia 04/09/2021   Other vitamin B12 deficiency anemias 04/09/2021   Chronic atrial fibrillation, unspecified (HCC) 10/04/2020   Carcinoma of ascending colon (HCC) 10/04/2020   Dyspnea 10/04/2020   Lung cancer, lower lobe (HCC) 09/24/2020   Lung nodule, solitary 08/16/2020   Status post thoracentesis    Schatzki's ring    Cancer of ascending colon  (HCC)    Gastrointestinal hemorrhage    Cat allergies 07/26/2020   Symptomatic anemia 07/25/2020   Idiopathic peripheral neuropathy 02/23/2020   Benign paroxysmal positional vertigo due to bilateral vestibular disorder 02/09/2020   Cysts of both ovaries 11/24/2019   Other cirrhosis of liver (HCC) 11/24/2019   Early satiety 04/14/2019   Paroxysmal atrial fibrillation (HCC) 04/14/2019   Mild CAD 02/02/2019   Abnormal nuclear stress test    Iron deficiency anemia due to chronic blood loss 10/27/2018   Alcoholism (HCC) 10/21/2018   CKD (chronic kidney disease) stage 3, GFR 30-59 ml/min (HCC) 01/04/2018   Senile purpura (HCC) 01/04/2018   At high risk for falls 07/14/2017   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HCC) 07/14/2017   Gastro-esophageal reflux disease without esophagitis 07/14/2017   History of pulmonary embolus (PE) 07/14/2017   Malaise and fatigue 07/14/2017   Mild episode of recurrent major depressive disorder (HCC) 07/14/2017   Mixed hyperlipidemia 07/14/2017   Prediabetes 07/14/2017   Skin rash 07/14/2017   Vitamin B12 deficiency 07/14/2017   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   Multiple lung nodules on CT 05/16/2016   Osteoporosis 05/16/2016   Centrilobular emphysema (HCC) 05/16/2016   Age-related osteoporosis without current pathological fracture 05/16/2016   Allergic rhinitis 07/17/2015   Pulmonary embolism (HCC) 11/23/2014   Rheumatoid arthritis (HCC) 09/20/2010   Rheumatoid arthritis (HCC) 09/20/2010   COPD (chronic obstructive pulmonary disease) (HCC) 08/22/2010   Hypertension 08/22/2010   Essential hypertension 08/22/2010    Orientation RESPIRATION BLADDER Height & Weight     Place, Self  O2 (nasal cannula; 3-4L) Incontinent, External catheter Weight:  105 lb 2.6 oz (47.7 kg) Height:  5' (152.4 cm)  BEHAVIORAL SYMPTOMS/MOOD NEUROLOGICAL BOWEL NUTRITION STATUS      Continent Diet (See dc summary)  AMBULATORY STATUS COMMUNICATION OF NEEDS  Skin   Extensive Assist Verbally PU Stage and Appropriate Care (Buttocks Bilateral Stage 1; Right Heel Posterior Deep Tissue)                       Personal Care Assistance Level of Assistance  Bathing, Feeding, Dressing Bathing Assistance: Maximum assistance Feeding assistance: Independent Dressing Assistance: Maximum assistance     Functional Limitations Info  Sight Sight Info: Impaired (Eyeglasses)        SPECIAL CARE FACTORS FREQUENCY  PT (By licensed PT), OT (By licensed OT)     PT Frequency: 5x OT Frequency: 5x            Contractures Contractures Info: Not present    Additional Factors Info  Code Status, Allergies Code Status Info: DNR- Limited Allergies Info: Oxycodone; Cats           Current Medications (11/07/2022):  This is the current hospital active medication list Current Facility-Administered Medications  Medication Dose Route Frequency Provider Last Rate Last Admin   0.9 %  sodium chloride infusion  250 mL Intravenous Continuous Cristopher Peru, PA-C 10 mL/hr at 11/06/22 0800 Infusion Verify at 11/06/22 0800   arformoterol (BROVANA) nebulizer solution 15 mcg  15 mcg Nebulization BID Cristopher Peru, PA-C   15 mcg at 11/07/22 8841   aspirin EC tablet 81 mg  81 mg Oral Daily Cristopher Peru, PA-C   81 mg at 11/07/22 0944   atorvastatin (LIPITOR) tablet 20 mg  20 mg Oral QPM Mertha Baars E, PA-C   20 mg at 11/06/22 1818   azithromycin (ZITHROMAX) 500 mg in sodium chloride 0.9 % 250 mL IVPB  500 mg Intravenous Q24H Lorre Nick, MD 250 mL/hr at 11/07/22 1232 500 mg at 11/07/22 1232   budesonide (PULMICORT) nebulizer solution 0.25 mg  0.25 mg Nebulization BID Mertha Baars E, PA-C   0.25 mg at 11/07/22 0735   carvedilol (COREG) tablet 6.25 mg  6.25 mg Oral BID WC Elgergawy, Leana Roe, MD       cefTRIAXone (ROCEPHIN) 2 g in sodium chloride 0.9 % 100 mL IVPB  2 g Intravenous Q24H Lorre Nick, MD 200 mL/hr at 11/07/22 1437 2 g at 11/07/22 1437    Chlorhexidine Gluconate Cloth 2 % PADS 6 each  6 each Topical [id number] Cheri Fowler, MD   6 each at 11/07/22 0452   docusate sodium (COLACE) capsule 100 mg  100 mg Oral BID PRN Cristopher Peru, PA-C       gabapentin (NEURONTIN) capsule 100 mg  100 mg Oral QHS Cristopher Peru, PA-C   100 mg at 11/06/22 2216   heparin injection 5,000 Units  5,000 Units Subcutaneous Q8H Cristopher Peru, PA-C   5,000 Units at 11/07/22 1522   hydrALAZINE (APRESOLINE) injection 5 mg  5 mg Intravenous Q4H PRN Elgergawy, Leana Roe, MD       insulin aspart (novoLOG) injection 0-15 Units  0-15 Units Subcutaneous TID WC Elgergawy, Leana Roe, MD       insulin aspart (novoLOG) injection 0-5 Units  0-5 Units Subcutaneous QHS Elgergawy, Dawood S, MD       ipratropium-albuterol (DUONEB) 0.5-2.5 (3) MG/3ML nebulizer solution 3 mL  3 mL Nebulization Q6H PRN Cristopher Peru, PA-C  methylPREDNISolone sodium succinate (SOLU-MEDROL) 40 mg/mL injection 40 mg  40 mg Intravenous Q12H Lidia Collum, PA-C   40 mg at 11/07/22 0944   mirtazapine (REMERON) tablet 7.5 mg  7.5 mg Oral QHS Elgergawy, Leana Roe, MD       montelukast (SINGULAIR) tablet 10 mg  10 mg Oral QHS Pia Mau D, PA-C   10 mg at 11/06/22 2216   Oral care mouth rinse  15 mL Mouth Rinse 4 times per day Cheri Fowler, MD   15 mL at 11/07/22 1250   Oral care mouth rinse  15 mL Mouth Rinse PRN Cheri Fowler, MD       pantoprazole (PROTONIX) injection 40 mg  40 mg Intravenous Q24H Pia Mau D, PA-C   40 mg at 11/07/22 1518   polyethylene glycol (MIRALAX / GLYCOLAX) packet 17 g  17 g Oral Daily PRN Cristopher Peru, PA-C       revefenacin (YUPELRI) nebulizer solution 175 mcg  175 mcg Nebulization Daily Cristopher Peru, PA-C   175 mcg at 11/07/22 0735   rOPINIRole (REQUIP) tablet 0.5 mg  0.5 mg Oral QHS Elgergawy, Leana Roe, MD         Discharge Medications: Please see discharge summary for a list of discharge medications.  Relevant Imaging Results:  Relevant Lab  Results:   Additional Information SSN: [id number]  Marliss Coots, LCSW

## 2022-11-07 NOTE — Evaluation (Signed)
Physical Therapy Evaluation Patient Details Name: Desiree [name] MRN: [id number] DOB: 1945-04-14 Today's Date: 11/07/2022  History of Present Illness  Patient is a 77 year old female with acute on chronic respiratory failure due to new bilateral community-acquired pneumonia, associated septic shock. History of  non-small cell lung cancer, rheumatoid arthritis, A-fib, diabetes, chronic hypoxemic respiratory failure on 3-4 L/min.  Clinical Impression  Patient is agreeable to PT evaluation. She reports she lives in ALF and is modified independent with mobility using her 4 wheeled walker for ambulation. She wears 3-4L02 at baseline.  Today, the patient reports she feels generalized weakness. She required maximal assistance for pericare after being incontinent of bowel prior to PT arrival to room with total linen and gown change required. Assistance required for bed mobility and stand pivot transfer from bed to chair. Fair sitting balance, however patient has a right lean preference. Patient is fatigued with activity. Sp02 remained in the 90's with all mobility efforts. She reports feeling more comfortable sitting in the chair and is usually out of bed for the day at baseline. The patient does not appear to be at her baseline level of functional independence. Recommend PT follow up to maximize independence and facilitate return to prior level of function. Anticipate the patient will require rehabilitation after this hospital stay, <3 hours/day.       If plan is discharge home, recommend the following: A lot of help with walking and/or transfers;A lot of help with bathing/dressing/bathroom;Assistance with cooking/housework;Assist for transportation   Can travel by private vehicle   No    Equipment Recommendations None recommended by PT  Recommendations for Other Services       Functional Status Assessment Patient has had a recent decline in their functional status and demonstrates the ability to  make significant improvements in function in a reasonable and predictable amount of time.     Precautions / Restrictions Precautions Precautions: Fall Restrictions Weight Bearing Restrictions: No      Mobility  Bed Mobility Overal bed mobility: Needs Assistance Bed Mobility: Rolling, Supine to Sit, Sit to Supine Rolling: Mod assist   Supine to sit: Mod assist     General bed mobility comments: patient had been incintinent of bowel prior to arrival to room. assistance for rolling to left and right with maximal assistance for pericare. verbal cues for technique for bed mobility. increased time and effort required    Transfers Overall transfer level: Needs assistance Equipment used: 1 person hand held assist Transfers: Bed to chair/wheelchair/BSC   Stand pivot transfers: Max assist         General transfer comment: patient performed stand pivot from bed to recliner chair with assistance. verbal cues for technique and sequencing. patient declined attempting to use rolling walker with transfer secondary to generalized weakness    Ambulation/Gait               General Gait Details: not attempted due to poor standing tolerance  Stairs            Wheelchair Mobility     Tilt Bed    Modified Rankin (Stroke Patients Only)       Balance Overall balance assessment: Needs assistance Sitting-balance support: Feet supported Sitting balance-Leahy Scale: Fair Sitting balance - Comments: no loss of balance, however patient has a preferred right lateral lean while sitting, even with trunk supported in chair Postural control: Right lateral lean Standing balance support: Bilateral upper extremity supported Standing balance-Leahy Scale: Zero Standing balance comment: posterior  lean with standing with facilation for anterior weight shifting. maximal assistance required with standing tolerance no more than a few seconds                              Pertinent Vitals/Pain Pain Assessment Pain Assessment: No/denies pain    Home Living Family/patient expects to be discharged to:: Assisted living                 Home Equipment: Rollator (4 wheels);Shower seat Additional Comments: 3-4 L 02 at baseline    Prior Function Prior Level of Function : Needs assist             Mobility Comments: patient reports Mod I with mobility using rollator for ambulation, short distance ambulation ADLs Comments: patient reports she can bath and dress herself but can have assistance if needed at her facility. medication and meal assistance     Extremity/Trunk Assessment   Upper Extremity Assessment Upper Extremity Assessment: Generalized weakness    Lower Extremity Assessment Lower Extremity Assessment: Generalized weakness    Cervical / Trunk Assessment Cervical / Trunk Assessment: Kyphotic  Communication   Communication Communication: No apparent difficulties  Cognition Arousal: Alert Behavior During Therapy: WFL for tasks assessed/performed Overall Cognitive Status: No family/caregiver present to determine baseline cognitive functioning                                 General Comments: patient is disoriented to time. she is able to follow single step commands with increased time. increased time for processing        General Comments General comments (skin integrity, edema, etc.): heart rate up briefly to 137bpm with activity, decreasing to 110's with rest break. Sp02 in the 90's throughout session on 4L 02    Exercises     Assessment/Plan    PT Assessment Patient needs continued PT services  PT Problem List Decreased strength;Decreased activity tolerance;Decreased balance;Decreased mobility;Cardiopulmonary status limiting activity       PT Treatment Interventions DME instruction;Gait training;Stair training;Functional mobility training;Therapeutic activities;Therapeutic exercise;Balance  training;Neuromuscular re-education;Cognitive remediation;Patient/family education    PT Goals (Current goals can be found in the Care Plan section)  Acute Rehab PT Goals Patient Stated Goal: to feel better PT Goal Formulation: With patient Time For Goal Achievement: 11/14/22 Potential to Achieve Goals: Good    Frequency Min 1X/week     Co-evaluation               AM-PAC PT "6 Clicks" Mobility  Outcome Measure Help needed turning from your back to your side while in a flat bed without using bedrails?: A Lot Help needed moving from lying on your back to sitting on the side of a flat bed without using bedrails?: A Lot Help needed moving to and from a bed to a chair (including a wheelchair)?: A Lot Help needed standing up from a chair using your arms (e.g., wheelchair or bedside chair)?: A Lot Help needed to walk in hospital room?: A Lot Help needed climbing 3-5 steps with a railing? : Total 6 Click Score: 11    End of Session Equipment Utilized During Treatment: Oxygen Activity Tolerance: Patient limited by fatigue Patient left: in chair;with call bell/phone within reach;with chair alarm set Nurse Communication: Mobility status PT Visit Diagnosis: Unsteadiness on feet (R26.81);Muscle weakness (generalized) (M62.81)    Time: 2956-2130 PT Time Calculation (min) (  ACUTE ONLY): 31 min   Charges:   PT Evaluation $PT Eval Moderate Complexity: 1 Mod   PT General Charges $$ ACUTE PT VISIT: 1 Visit         Donna Bernard, PT, MPT   Ina Homes 11/07/2022, 9:47 AM

## 2022-11-07 NOTE — Progress Notes (Signed)
PROGRESS NOTE    Desiree [name]  WUJ:[id number] DOB: [birth year] DOA: 11/05/2022 PCP: Patient, No Pcp Per   Chief Complaint  Patient presents with   Respiratory Distress    Brief Narrative:   77 year old female with past medical history of paroxysmal atrial fibrillation, alcoholism, CAD, colon cancer, emphysema, CKD III, HTN, lung cancer, RA, Type 2 DM who presents to the emergency department on 11/05/22 with complaint of shortness of breath. Has had several days of shortness of breath and arrived with O2 sat 80% on 5LNC. She uses 3LNC at baseline. Placed on CPAP with improvement. She was febrile to 103.3 with tachycardia. Labs with Cr 1.07, WBC 11.9, hgb 10.9, lactic 2.3. Blood cultures drawn. CXR showing bilateral interstitial opacities. Given 1.5L fluid resuscitation, rocephin/azithro for CAP coverage and started on low dose levophed for hypotension.  Patient was admitted to ICU for pressors requirement, she did require BiPAP initially, she has improved, came off BiPAP, she was transferred to progressive care 11/07/2022.    Assessment & Plan:   Principal Problem:   Septic shock (HCC) Active Problems:   Lung cancer, lower lobe (HCC)   CKD (chronic kidney disease) stage 3, GFR 30-59 ml/min (HCC)   History of pulmonary embolus (PE)   Rheumatoid arthritis (HCC)   Alcoholism (HCC)   Other cirrhosis of liver (HCC)   Cancer of ascending colon (HCC)   Essential hypertension   Chronic pain   Hypothyroidism   Type 2 diabetes mellitus without complications (HCC)   Chronic obstructive pulmonary disease (HCC)   AF (paroxysmal atrial fibrillation) (HCC)   Septic shock 2/2 community acquired pneumonia;  - CXR with bilateral infiltrates, increased O2 requirement, febrile.  -UA without evidence of UTI.  - RVP negative. Legionella is pending and strep is negative. 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  Mains as needed on Advil -Wean oxygen as tolerated - brovana, budesonide, yupelri ordered - scheduled duoneb - methylprednisolone 125mg  once then 40mg   BID - pulmonary hygiene    HTN Combined systolic/diastolic chf: echo 08/2022 lvef 40-45% HLD CAD -Pressure started to increase, she is hypertensive as well, but remains labile, so I will start now on Coreg especially in the setting of sinus tachycardia  -Will add as needed hydralazine -Continue with aspirin and statin.    T2DM -Will keep on insulin sliding scale now she is on steroids.   CKD stage III; baseline Cr ~0.8. today 1.07 - trend bmp  - avoid nephrotoxic agents    Paroxysmal afib  -not on ac due to hx of bleeding -tele monitoring -Resume on Coreg given she is tachycardic   RA Plan: -steroids as above, resume home dose prednisone as of IV steroids -hold home meds for now laying Plaquenil and Arava   Peripheral neuropathy RLS -Resume Requip  Deconditioning. -PT/OT consulted  DVT prophylaxis: Heparin Code Status: DNR Family Communication: None at bedside Disposition:   Status is: Inpatient    Consultants:  PCCM (from ICU)   Subjective: Patient reports she is feeling better, reports cough, congestion  Objective: Vitals:   11/06/22 2356 11/07/22 0400 11/07/22 0438 11/07/22 0802  BP: (!) 155/87 (!) 162/91  (!) 154/91  Pulse: Marland Kitchen)  106 (!) 117  (!) 118  Resp: 20 (!) 25  19  Temp: 98.1 F (36.7 C) 98.5 F (36.9 C)  98 F (36.7 C)  TempSrc: Oral Oral  Oral  SpO2: 97% 97%  93%  Weight:   47.7 kg   Height:        Intake/Output Summary (Last 24 hours) at 11/07/2022 1230 Last data filed at 11/07/2022 0933 Gross per 24 hour  Intake 240 ml  Output 400 ml  Net -160 ml   Filed Weights   11/05/22  1036 11/06/22 0708 11/07/22 0438  Weight: 46.8 kg 51.7 kg 47.7 kg    Examination:  Awake Alert, pleasant, conversant, answering questions appropriately, extremely frail, deconditioned Symmetrical Chest wall movement, Good air movement bilaterally, he is with scattered wheezing bilaterally, but no use of accessory muscles RRR,No Gallops,Rubs or new Murmurs, No Parasternal Heave +ve B.Sounds, Abd Soft, No tenderness, No rebound - guarding or rigidity. No Cyanosis, Clubbing or edema, No new Rash or bruise      Data Reviewed: I have personally reviewed following labs and imaging studies  CBC: Recent Labs  Lab 11/05/22 1059 11/05/22 1359 11/06/22 0331  WBC 11.9*  --  17.3*  NEUTROABS 10.9*  --   --   HGB 10.9* 9.2* 8.4*  HCT 36.1 27.0* 27.3*  MCV 103.7*  --  98.6  PLT 333  --  154    Basic Metabolic Panel: Recent Labs  Lab 11/05/22 1059 11/05/22 1359 11/06/22 0331  NA 138 138 138  K 3.7 4.0 3.9  CL 100  --  100  CO2 29  --  27  GLUCOSE 80  --  122*  BUN 11  --  18  CREATININE 1.07*  --  1.12*  CALCIUM 9.2  --  8.2*    GFR: Estimated Creatinine Clearance: 29.3 mL/min (A) (by C-G formula based on SCr of 1.12 mg/dL (H)).  Liver Function Tests: Recent Labs  Lab 11/05/22 1059  AST 28  ALT 13  ALKPHOS 107  BILITOT 0.5  PROT 6.7  ALBUMIN 3.1*    CBG: Recent Labs  Lab 11/06/22 1534 11/06/22 2048 11/07/22 0043 11/07/22 0437 11/07/22 0808  GLUCAP 125* 122* 121* 130* 151*     Recent Results (from the past 240 hour(s))  Blood Culture (routine x 2)     Status: None (Preliminary result)   Collection Time: 11/05/22 10:46 AM   Specimen: BLOOD  Result Value Ref Range Status   Specimen Description BLOOD SITE NOT SPECIFIED  Final   Special Requests   Final    BOTTLES DRAWN AEROBIC AND ANAEROBIC Blood Culture adequate volume   Culture   Final    NO GROWTH 2 DAYS Performed at [facility], [address], [place], [place] [address]    Report Status  PENDING  Incomplete  Blood Culture (routine x 2)     Status: None (Preliminary result)   Collection Time: 11/05/22 10:51 AM   Specimen: BLOOD  Result Value Ref Range Status   Specimen Description BLOOD SITE NOT SPECIFIED  Final   Special Requests   Final    BOTTLES DRAWN AEROBIC AND ANAEROBIC Blood Culture adequate volume   Culture   Final    NO GROWTH 2 DAYS Performed at [facility], [address], [place], [place] [address]    Report Status PENDING  Incomplete  Resp panel by RT-PCR (RSV, Flu A&B, Covid) Anterior Nasal Swab     Status: None  Collection Time: 11/05/22 10:59 AM   Specimen: Anterior Nasal Swab  Result Value Ref Range Status   SARS Coronavirus 2 by RT PCR NEGATIVE NEGATIVE Final   Influenza A by PCR NEGATIVE NEGATIVE Final   Influenza B by PCR NEGATIVE NEGATIVE Final    Comment: (NOTE) The Xpert Xpress SARS-CoV-2/FLU/RSV plus assay is intended as an aid in the diagnosis of influenza from Nasopharyngeal swab specimens and should not be used as a sole basis for treatment. Nasal washings and aspirates are unacceptable for Xpert Xpress SARS-CoV-2/FLU/RSV testing.  Fact Sheet for Patients: BloggerCourse.com  Fact Sheet for Healthcare Providers: SeriousBroker.it  This test is not yet approved or cleared by the [place] FDA and has been authorized for detection and/or diagnosis of SARS-CoV-2 by FDA under an Emergency Use Authorization (EUA). This EUA will remain in effect (meaning this test can be used) for the duration of the COVID-19 declaration under Section 564(b)(1) of the Act, 21 U.S.C. section 360bbb-3(b)(1), unless the authorization is terminated or revoked.     Resp Syncytial Virus by PCR NEGATIVE NEGATIVE Final    Comment: (NOTE) Fact Sheet for Patients: BloggerCourse.com  Fact Sheet for Healthcare Providers: SeriousBroker.it  This test is  not yet approved or cleared by the [place] FDA and has been authorized for detection and/or diagnosis of SARS-CoV-2 by FDA under an Emergency Use Authorization (EUA). This EUA will remain in effect (meaning this test can be used) for the duration of the COVID-19 declaration under Section 564(b)(1) of the Act, 21 U.S.C. section 360bbb-3(b)(1), unless the authorization is terminated or revoked.  Performed at [facility], [address] [place], [place] [address]   Respiratory (~20 pathogens) panel by PCR     Status: None   Collection Time: 11/05/22  1:17 PM   Specimen: Nasopharyngeal Swab; Respiratory  Result Value Ref Range Status   Adenovirus NOT DETECTED NOT DETECTED Final   Coronavirus 229E NOT DETECTED NOT DETECTED Final    Comment: (NOTE) The Coronavirus on the Respiratory Panel, DOES NOT test for the novel  Coronavirus (2019 nCoV)    Coronavirus HKU1 NOT DETECTED NOT DETECTED Final   Coronavirus NL63 NOT DETECTED NOT DETECTED Final   Coronavirus OC43 NOT DETECTED NOT DETECTED Final   Metapneumovirus NOT DETECTED NOT DETECTED Final   Rhinovirus / Enterovirus NOT DETECTED NOT DETECTED Final   Influenza A NOT DETECTED NOT DETECTED Final   Influenza B NOT DETECTED NOT DETECTED Final   Parainfluenza Virus 1 NOT DETECTED NOT DETECTED Final   Parainfluenza Virus 2 NOT DETECTED NOT DETECTED Final   Parainfluenza Virus 3 NOT DETECTED NOT DETECTED Final   Parainfluenza Virus 4 NOT DETECTED NOT DETECTED Final   Respiratory Syncytial Virus NOT DETECTED NOT DETECTED Final   Bordetella pertussis NOT DETECTED NOT DETECTED Final   Bordetella Parapertussis NOT DETECTED NOT DETECTED Final   Chlamydophila pneumoniae NOT DETECTED NOT DETECTED Final   Mycoplasma pneumoniae NOT DETECTED NOT DETECTED Final    Comment: Performed at [facility], [address], [place], [place] [address]  MRSA Next Gen by PCR, Nasal     Status: None   Collection Time: 11/05/22  4:13 PM    Specimen: Nasal Mucosa; Nasal Swab  Result Value Ref Range Status   MRSA by PCR Next Gen NOT DETECTED NOT DETECTED Final    Comment: (NOTE) The GeneXpert MRSA Assay (FDA approved for NASAL specimens only), is one component of a comprehensive MRSA colonization surveillance program. It is not  intended to diagnose MRSA infection nor to guide or monitor treatment for MRSA infections. Test performance is not FDA approved in patients less than 29 years old. Performed at [facility], [address], [place], [place] [address]          Radiology Studies: No results found.      Scheduled Meds:  arformoterol  15 mcg Nebulization BID   aspirin EC  81 mg Oral Daily   atorvastatin  20 mg Oral QPM   budesonide (PULMICORT) nebulizer solution  0.25 mg Nebulization BID   Chlorhexidine Gluconate Cloth  6 each Topical Q0600   gabapentin  100 mg Oral QHS   heparin  5,000 Units Subcutaneous Q8H   insulin aspart  0-9 Units Subcutaneous Q4H   methylPREDNISolone (SOLU-MEDROL) injection  40 mg Intravenous Q12H   montelukast  10 mg Oral QHS   mouth rinse  15 mL Mouth Rinse 4 times per day   pantoprazole (PROTONIX) IV  40 mg Intravenous Q24H   revefenacin  175 mcg Nebulization Daily   Continuous Infusions:  sodium chloride 10 mL/hr at 11/06/22 0800   azithromycin 500 mg (11/06/22 1138)   cefTRIAXone (ROCEPHIN)  IV 2 g (11/06/22 1043)     LOS: 2 days       Huey Bienenstock, MD [facility]   To contact the attending provider between 7A-7P or the covering provider during after hours 7P-7A, please log into the web site [email or web address] and access using universal [place] password for that web site. If you do not have the password, please call the hospital operator.  11/07/2022, 12:30 PM

## 2022-11-08 DIAGNOSIS — R6521 Severe sepsis with septic shock: Secondary | ICD-10-CM | POA: Diagnosis not present

## 2022-11-08 DIAGNOSIS — A419 Sepsis, unspecified organism: Secondary | ICD-10-CM | POA: Diagnosis not present

## 2022-11-08 LAB — GLUCOSE, CAPILLARY
Glucose-Capillary: 142 mg/dL — ABNORMAL HIGH (ref 70–99)
Glucose-Capillary: 118 mg/dL — ABNORMAL HIGH (ref 70–99)
Glucose-Capillary: 124 mg/dL — ABNORMAL HIGH (ref 70–99)
Glucose-Capillary: 124 mg/dL — ABNORMAL HIGH (ref 70–99)
Glucose-Capillary: 158 mg/dL — ABNORMAL HIGH (ref 70–99)

## 2022-11-08 MED ORDER — PANTOPRAZOLE SODIUM 40 MG PO TBEC
40.0000 mg | DELAYED_RELEASE_TABLET | Freq: Every day | ORAL | Status: DC
  Administered 2022-11-08 – 2022-11-10 (×3): 40 mg via ORAL
  Filled 2022-11-08 (×3): qty 1

## 2022-11-08 MED ORDER — AZITHROMYCIN 500 MG PO TABS
500.0000 mg | ORAL_TABLET | Freq: Every day | ORAL | Status: AC
  Administered 2022-11-09: 500 mg via ORAL
  Filled 2022-11-08: qty 1

## 2022-11-08 MED ORDER — LOPERAMIDE HCL 2 MG PO CAPS
2.0000 mg | ORAL_CAPSULE | Freq: Four times a day (QID) | ORAL | Status: DC | PRN
  Administered 2022-11-08 – 2022-11-10 (×4): 2 mg via ORAL
  Filled 2022-11-08 (×4): qty 1

## 2022-11-08 MED ORDER — AMOXICILLIN-POT CLAVULANATE 500-125 MG PO TABS
1.0000 | ORAL_TABLET | Freq: Two times a day (BID) | ORAL | Status: AC
  Administered 2022-11-09 (×2): 1 via ORAL
  Filled 2022-11-08 (×3): qty 1

## 2022-11-08 NOTE — Plan of Care (Signed)

## 2022-11-08 NOTE — Progress Notes (Signed)
PROGRESS NOTE    Desiree [name]  ZOX:[id number] DOB: 1945/03/23 DOA: 11/05/2022 PCP: Patient, No Pcp Per   Chief Complaint  Patient presents with   Respiratory Distress    Brief Narrative:   77 year old female with past medical history of paroxysmal atrial fibrillation, alcoholism, CAD, colon cancer, emphysema, CKD III, HTN, lung cancer, RA, Type 2 DM who presents to the emergency department on 11/05/22 with complaint of shortness of breath. Has had several days of shortness of breath and arrived with O2 sat 80% on 5LNC. She uses 3LNC at baseline. Placed on CPAP with improvement. She was febrile to 103.3 with tachycardia. Labs with Cr 1.07, WBC 11.9, hgb 10.9, lactic 2.3. Blood cultures drawn. CXR showing bilateral interstitial opacities. Given 1.5L fluid resuscitation, rocephin/azithro for CAP coverage and started on low dose levophed for hypotension.  Patient was admitted to ICU for pressors requirement, she did require BiPAP initially, she has improved, came off BiPAP, she was transferred to progressive care 11/07/2022.    Assessment & Plan:   Principal Problem:   Septic shock (HCC) Active Problems:   Lung cancer, lower lobe (HCC)   CKD (chronic kidney disease) stage 3, GFR 30-59 ml/min (HCC)   History of pulmonary embolus (PE)   Rheumatoid arthritis (HCC)   Alcoholism (HCC)   Other cirrhosis of liver (HCC)   Cancer of ascending colon (HCC)   Essential hypertension   Chronic pain   Hypothyroidism   Type 2 diabetes mellitus without complications (HCC)   Chronic obstructive pulmonary disease (HCC)   AF (paroxysmal atrial fibrillation) (HCC)   Septic shock 2/2 community acquired pneumonia;  - CXR with bilateral infiltrates, increased O2 requirement, febrile.  -UA without evidence of UTI.  - RVP negative. Legionella  and strep are negative. Procal 56  - off pressors 10/2 - con't rocephin/azithro for CAP coverage, can transition to p.o. regimen from tomorrow. 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  Mains as needed on Advil -Wean oxygen as tolerated - brovana, budesonide, yupelri ordered - scheduled duoneb - methylprednisolone 125mg  once then 40mg   BID - pulmonary hygiene    HTN Combined systolic/diastolic chf: echo 08/2022 lvef 40-45% HLD CAD -Pressure started to increase, she is hypertensive as well, but remains labile, so I will start now on Coreg especially in the setting of sinus tachycardia  -Will add as needed hydralazine -Continue with aspirin and statin.    T2DM -Will keep on insulin sliding scale now she is on steroids.   CKD stage III; baseline Cr ~0.8. today 1.07 - trend bmp  - avoid nephrotoxic agents    Paroxysmal afib  -not on ac due to hx of bleeding -tele monitoring -Resume on Coreg given she is tachycardic   RA Plan: -steroids as above, resume home dose prednisone as of IV steroids -hold home meds for now laying Plaquenil and Arava   Peripheral neuropathy RLS -Resume Requip  Deconditioning. -PT/OT consulted  DVT prophylaxis: Heparin Code Status: DNR Family Communication: None at bedside Disposition:   Status is: Inpatient    Consultants:  PCCM (from ICU)   Subjective: Patient reports dyspnea and cough much improved.  Objective: Vitals:   11/08/22 0731 11/08/22 0732 11/08/22 0800 11/08/22 1215  BP:   (!) 147/84 (!) 140/88  Pulse: (!) 106  (!) 102   Resp: 20     Temp:   98.7 F (37.1 C) 98.2 F (36.8 C)  TempSrc:   Oral Oral  SpO2: 100% 100% 100%   Weight:      Height:        Intake/Output Summary (Last 24 hours) at 11/08/2022 1443 Last data filed at 11/08/2022 1156 Gross per 24 hour  Intake --  Output 650 ml  Net -650 ml   Filed Weights   11/05/22 1036 11/06/22 0708 11/07/22 0438   Weight: 46.8 kg 51.7 kg 47.7 kg    Examination:  Awake Alert, pleasant, conversant, answering questions appropriately, extremely frail, deconditioned Symmetrical Chest wall movement, is at the bases, scattered Rales, but no use of accessory muscles or tachypnea RRR,No Gallops,Rubs or new Murmurs, No Parasternal Heave +ve B.Sounds, Abd Soft, No tenderness, No rebound - guarding or rigidity. No Cyanosis, Clubbing or edema, No new Rash or bruise       Data Reviewed: I have personally reviewed following labs and imaging studies  CBC: Recent Labs  Lab 11/05/22 1059 11/05/22 1359 11/06/22 0331  WBC 11.9*  --  17.3*  NEUTROABS 10.9*  --   --   HGB 10.9* 9.2* 8.4*  HCT 36.1 27.0* 27.3*  MCV 103.7*  --  98.6  PLT 333  --  154    Basic Metabolic Panel: Recent Labs  Lab 11/05/22 1059 11/05/22 1359 11/06/22 0331  NA 138 138 138  K 3.7 4.0 3.9  CL 100  --  100  CO2 29  --  27  GLUCOSE 80  --  122*  BUN 11  --  18  CREATININE 1.07*  --  1.12*  CALCIUM 9.2  --  8.2*    GFR: Estimated Creatinine Clearance: 29.3 mL/min (A) (by C-G formula based on SCr of 1.12 mg/dL (H)).  Liver Function Tests: Recent Labs  Lab 11/05/22 1059  AST 28  ALT 13  ALKPHOS 107  BILITOT 0.5  PROT 6.7  ALBUMIN 3.1*    CBG: Recent Labs  Lab 11/07/22 1232 11/07/22 1716 11/07/22 2108 11/08/22 0837 11/08/22 1319  GLUCAP 100* 143* 93 142* 118*     Recent Results (from the past 240 hour(s))  Blood Culture (routine x 2)     Status: None (Preliminary result)   Collection Time: 11/05/22 10:46 AM   Specimen: BLOOD  Result Value Ref Range Status   Specimen Description BLOOD SITE NOT SPECIFIED  Final   Special Requests   Final    BOTTLES DRAWN AEROBIC AND ANAEROBIC Blood Culture adequate volume   Culture   Final    NO GROWTH 3 DAYS Performed at [facility] Lab, [address] [place], [place] [address]    Report Status PENDING  Incomplete  Blood Culture (routine x 2)     Status:  None (Preliminary result)   Collection Time: 11/05/22 10:51 AM   Specimen: BLOOD  Result Value Ref Range Status   Specimen Description BLOOD SITE NOT SPECIFIED  Final   Special Requests   Final    BOTTLES DRAWN AEROBIC AND ANAEROBIC Blood Culture adequate volume   Culture   Final    NO GROWTH 3 DAYS Performed at [facility], [address], [place], [place] [address]    Report Status PENDING  Incomplete  Resp panel by RT-PCR (RSV, Flu A&B, Covid) Anterior Nasal Swab     Status: None   Collection Time: 11/05/22 10:59 AM  Specimen: Anterior Nasal Swab  Result Value Ref Range Status   SARS Coronavirus 2 by RT PCR NEGATIVE NEGATIVE Final   Influenza A by PCR NEGATIVE NEGATIVE Final   Influenza B by PCR NEGATIVE NEGATIVE Final    Comment: (NOTE) The Xpert Xpress SARS-CoV-2/FLU/RSV plus assay is intended as an aid in the diagnosis of influenza from Nasopharyngeal swab specimens and should not be used as a sole basis for treatment. Nasal washings and aspirates are unacceptable for Xpert Xpress SARS-CoV-2/FLU/RSV testing.  Fact Sheet for Patients: BloggerCourse.com  Fact Sheet for Healthcare Providers: SeriousBroker.it  This test is not yet approved or cleared by the [place] FDA and has been authorized for detection and/or diagnosis of SARS-CoV-2 by FDA under an Emergency Use Authorization (EUA). This EUA will remain in effect (meaning this test can be used) for the duration of the COVID-19 declaration under Section 564(b)(1) of the Act, 21 U.S.C. section 360bbb-3(b)(1), unless the authorization is terminated or revoked.     Resp Syncytial Virus by PCR NEGATIVE NEGATIVE Final    Comment: (NOTE) Fact Sheet for Patients: BloggerCourse.com  Fact Sheet for Healthcare Providers: SeriousBroker.it  This test is not yet approved or cleared by the [place] FDA and has  been authorized for detection and/or diagnosis of SARS-CoV-2 by FDA under an Emergency Use Authorization (EUA). This EUA will remain in effect (meaning this test can be used) for the duration of the COVID-19 declaration under Section 564(b)(1) of the Act, 21 U.S.C. section 360bbb-3(b)(1), unless the authorization is terminated or revoked.  Performed at [facility], [address] [place], [place] [address]   Respiratory (~20 pathogens) panel by PCR     Status: None   Collection Time: 11/05/22  1:17 PM   Specimen: Nasopharyngeal Swab; Respiratory  Result Value Ref Range Status   Adenovirus NOT DETECTED NOT DETECTED Final   Coronavirus 229E NOT DETECTED NOT DETECTED Final    Comment: (NOTE) The Coronavirus on the Respiratory Panel, DOES NOT test for the novel  Coronavirus (2019 nCoV)    Coronavirus HKU1 NOT DETECTED NOT DETECTED Final   Coronavirus NL63 NOT DETECTED NOT DETECTED Final   Coronavirus OC43 NOT DETECTED NOT DETECTED Final   Metapneumovirus NOT DETECTED NOT DETECTED Final   Rhinovirus / Enterovirus NOT DETECTED NOT DETECTED Final   Influenza A NOT DETECTED NOT DETECTED Final   Influenza B NOT DETECTED NOT DETECTED Final   Parainfluenza Virus 1 NOT DETECTED NOT DETECTED Final   Parainfluenza Virus 2 NOT DETECTED NOT DETECTED Final   Parainfluenza Virus 3 NOT DETECTED NOT DETECTED Final   Parainfluenza Virus 4 NOT DETECTED NOT DETECTED Final   Respiratory Syncytial Virus NOT DETECTED NOT DETECTED Final   Bordetella pertussis NOT DETECTED NOT DETECTED Final   Bordetella Parapertussis NOT DETECTED NOT DETECTED Final   Chlamydophila pneumoniae NOT DETECTED NOT DETECTED Final   Mycoplasma pneumoniae NOT DETECTED NOT DETECTED Final    Comment: Performed at [facility], [address], [place], [place] [address]  MRSA Next Gen by PCR, Nasal     Status: None   Collection Time: 11/05/22  4:13 PM   Specimen: Nasal Mucosa; Nasal Swab  Result Value Ref Range Status    MRSA by PCR Next Gen NOT DETECTED NOT DETECTED Final    Comment: (NOTE) The GeneXpert MRSA Assay (FDA approved for NASAL specimens only), is one component of a comprehensive MRSA colonization surveillance program. It is not intended to diagnose MRSA infection nor to  guide or monitor treatment for MRSA infections. Test performance is not FDA approved in patients less than 54 years old. Performed at [facility], [address] [place], [place] [address]          Radiology Studies: No results found.      Scheduled Meds:  arformoterol  15 mcg Nebulization BID   aspirin EC  81 mg Oral Daily   atorvastatin  20 mg Oral QPM   [START ON 11/09/2022] azithromycin  500 mg Oral Daily   budesonide (PULMICORT) nebulizer solution  0.25 mg Nebulization BID   carvedilol  6.25 mg Oral BID WC   Chlorhexidine Gluconate Cloth  6 each Topical Q0600   gabapentin  100 mg Oral QHS   heparin  5,000 Units Subcutaneous Q8H   insulin aspart  0-15 Units Subcutaneous TID WC   insulin aspart  0-5 Units Subcutaneous QHS   methylPREDNISolone (SOLU-MEDROL) injection  40 mg Intravenous Q12H   mirtazapine  7.5 mg Oral QHS   montelukast  10 mg Oral QHS   mouth rinse  15 mL Mouth Rinse 4 times per day   pantoprazole  40 mg Oral QHS   revefenacin  175 mcg Nebulization Daily   rOPINIRole  0.5 mg Oral QHS   Continuous Infusions:  sodium chloride 10 mL/hr at 11/06/22 0800   cefTRIAXone (ROCEPHIN)  IV 2 g (11/08/22 1033)     LOS: 3 days       Huey Bienenstock, MD [facility]   To contact the attending provider between 7A-7P or the covering provider during after hours 7P-7A, please log into the web site [email or web address] and access using universal [place] password for that web site. If you do not have the password, please call the hospital operator.  11/08/2022, 2:43 PM

## 2022-11-08 NOTE — Progress Notes (Signed)
RT note. Patient not requiring bipap at this time, currently on home settings 4 L [place] sat 100% with no labored breathing noted at this time. RT will continue to monitor and place patient on bipap if needed later.    11/08/22 0731  Therapy Vitals  Pulse Rate (!) 106  Resp 20  MEWS Score/Color  MEWS Score 1  MEWS Score Color Green  Respiratory Assessment  Assessment Type Mid-treatment  Respiratory Pattern Regular;Unlabored  Chest Assessment Chest expansion symmetrical  Bilateral Breath Sounds Clear;Diminished  Oxygen Therapy/Pulse Ox  O2 Device Nasal Cannula  O2 Therapy Oxygen  O2 Flow Rate (L/min) 4 L/min  SpO2 100 %

## 2022-11-08 NOTE — Progress Notes (Signed)
IV team to bedside for IV start, to return upon notification that pt is back in bed.

## 2022-11-08 NOTE — Progress Notes (Signed)
Per Dr. Randol Kern it is ok for patient to not have iv access.

## 2022-11-09 DIAGNOSIS — J441 Chronic obstructive pulmonary disease with (acute) exacerbation: Secondary | ICD-10-CM | POA: Diagnosis not present

## 2022-11-09 DIAGNOSIS — A419 Sepsis, unspecified organism: Secondary | ICD-10-CM | POA: Diagnosis not present

## 2022-11-09 DIAGNOSIS — I480 Paroxysmal atrial fibrillation: Secondary | ICD-10-CM | POA: Diagnosis not present

## 2022-11-09 DIAGNOSIS — R6521 Severe sepsis with septic shock: Secondary | ICD-10-CM | POA: Diagnosis not present

## 2022-11-09 LAB — GLUCOSE, CAPILLARY
Glucose-Capillary: 115 mg/dL — ABNORMAL HIGH (ref 70–99)
Glucose-Capillary: 131 mg/dL — ABNORMAL HIGH (ref 70–99)
Glucose-Capillary: 134 mg/dL — ABNORMAL HIGH (ref 70–99)
Glucose-Capillary: 138 mg/dL — ABNORMAL HIGH (ref 70–99)
Glucose-Capillary: 139 mg/dL — ABNORMAL HIGH (ref 70–99)
Glucose-Capillary: 151 mg/dL — ABNORMAL HIGH (ref 70–99)

## 2022-11-09 LAB — MAGNESIUM: Magnesium: 1.5 mg/dL — ABNORMAL LOW (ref 1.7–2.4)

## 2022-11-09 LAB — BASIC METABOLIC PANEL
Anion gap: 10 (ref 5–15)
BUN: 19 mg/dL (ref 8–23)
CO2: 29 mmol/L (ref 22–32)
Calcium: 8.2 mg/dL — ABNORMAL LOW (ref 8.9–10.3)
Chloride: 99 mmol/L (ref 98–111)
Creatinine, Ser: 0.93 mg/dL (ref 0.44–1.00)
GFR, Estimated: >60 mL/min (ref >60)
Glucose, Bld: 129 mg/dL — ABNORMAL HIGH (ref 70–99)
Potassium: 3.4 mmol/L — ABNORMAL LOW (ref 3.5–5.1)
Sodium: 138 mmol/L (ref 135–145)

## 2022-11-09 LAB — CBC
HCT: 34.7 % — ABNORMAL LOW (ref 36.0–46.0)
Hemoglobin: 10.8 g/dL — ABNORMAL LOW (ref 12.0–15.0)
MCH: 31.1 pg (ref 26.0–34.0)
MCHC: 31.1 g/dL (ref 30.0–36.0)
MCV: 100 fL (ref 80.0–100.0)
Platelets: 224 10*3/uL (ref 150–400)
RBC: 3.47 MIL/uL — ABNORMAL LOW (ref 3.87–5.11)
RDW: 14.2 % (ref 11.5–15.5)
WBC: 8.4 10*3/uL (ref 4.0–10.5)
nRBC: 0 % (ref 0.0–0.2)

## 2022-11-09 LAB — PHOSPHORUS: Phosphorus: 3.1 mg/dL (ref 2.5–4.6)

## 2022-11-09 MED ORDER — PREDNISONE 20 MG PO TABS
40.0000 mg | ORAL_TABLET | Freq: Every day | ORAL | Status: DC
  Administered 2022-11-10: 40 mg via ORAL
  Filled 2022-11-09: qty 2

## 2022-11-09 MED ORDER — POTASSIUM CHLORIDE CRYS ER 20 MEQ PO TBCR
40.0000 meq | EXTENDED_RELEASE_TABLET | Freq: Once | ORAL | Status: AC
  Administered 2022-11-09: 40 meq via ORAL
  Filled 2022-11-09: qty 2

## 2022-11-09 NOTE — Progress Notes (Signed)
PROGRESS NOTE    Desiree [name]  LOV:[id number] DOB: [birth year] DOA: 11/05/2022 PCP: Patient, No Pcp Per   Chief Complaint  Patient presents with   Respiratory Distress    Brief Narrative:   77 year old female with past medical history of paroxysmal atrial fibrillation, alcoholism, CAD, colon cancer, emphysema, CKD III, HTN, lung cancer, RA, Type 2 DM who presents to the emergency department on 11/05/22 with complaint of shortness of breath. Has had several days of shortness of breath and arrived with O2 sat 80% on 5LNC. She uses 3LNC at baseline. Placed on CPAP with improvement. She was febrile to 103.3 with tachycardia. Labs with Cr 1.07, WBC 11.9, hgb 10.9, lactic 2.3. Blood cultures drawn. CXR showing bilateral interstitial opacities. Given 1.5L fluid resuscitation, rocephin/azithro for CAP coverage and started on low dose levophed for hypotension.  Patient was admitted to ICU for pressors requirement, she did require BiPAP initially, she has improved, came off BiPAP, she was transferred to progressive care 11/07/2022.    Assessment & Plan:   Principal Problem:   Septic shock (HCC) Active Problems:   Lung cancer, lower lobe (HCC)   CKD (chronic kidney disease) stage 3, GFR 30-59 ml/min (HCC)   History of pulmonary embolus (PE)   Rheumatoid arthritis (HCC)   Alcoholism (HCC)   Other cirrhosis of liver (HCC)   Cancer of ascending colon (HCC)   Essential hypertension   Chronic pain   Hypothyroidism   Type 2 diabetes mellitus without complications (HCC)   Chronic obstructive pulmonary disease (HCC)   AF (paroxysmal atrial fibrillation) (HCC)   Septic shock 2/2 community acquired pneumonia;  - CXR with bilateral infiltrates, increased O2 requirement, febrile.  -UA without evidence of UTI.  - RVP negative. Legionella  and strep are negative. Procal 56  - off pressors 10/2 - con't rocephin/azithro for CAP coverage, transition to p.o. Augmentin and azithromycin today. 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   -Wean oxygen as tolerated - brovana, budesonide, yupelri ordered - scheduled duoneb -On IV Solu-Medrol initially, currently transitioned to p.o. prednisone from tomorrow(she is on p.o. prednisone 5 mg oral daily at baseline, this is need to be resumed when of high-dose steroids) - pulmonary hygiene, she was encouraged use incentive spirometer and flutter valve.   HTN Combined systolic/diastolic chf: echo 08/2022 lvef 40-45% HLD CAD -Pressure started to increase, she is hypertensive as well, but remains labile, so I will start now on Coreg especially in the setting of sinus tachycardia  -Will add as needed hydralazine -Continue with aspirin and statin.    T2DM -Will keep on insulin sliding scale now she is on steroids.   CKD stage III; baseline Cr ~0.8. today 1.07 - trend bmp  - avoid nephrotoxic agents    Paroxysmal afib  -not on ac due to hx of bleeding -tele monitoring -Resume on Coreg given she is tachycardic   RA Plan: -steroids as above, resume home dose prednisone as of IV steroids -hold home meds for now laying Plaquenil and Arava(right now she is on high-dose steroids, once taper she can be resumed on her home dose Plaquenil and Arava)   Peripheral neuropathy RLS -Resume Requip  Deconditioning. -PT/OT consulted  DVT prophylaxis: Heparin Code Status: DNR Family Communication: None at bedside Disposition:   Status is: Inpatient    Consultants:  PCCM (from ICU)   Subjective: Reports she is feeling much better today, dyspnea and cough has resolved.  Objective: Vitals:   11/09/22 0400 11/09/22 0800 11/09/22 0821 11/09/22 1240  BP: 120/73 (!) 139/99 (!) 147/97 127/72  Pulse: 78 (!) 101 90 100  Resp: 17 18 19 20   Temp: 97.8 F (36.6 C) 98.2 F (36.8 C)  98.2 F (36.8 C) 98.2 F (36.8 C)  TempSrc: Axillary Oral Oral Oral  SpO2: 97% 99% 100% 99%  Weight:      Height:       No intake or output data in the 24 hours ending 11/09/22 1258  Filed Weights   11/05/22 1036 11/06/22 0708 11/07/22 0438  Weight: 46.8 kg 51.7 kg 47.7 kg    Examination:    Awake Alert, pleasant, conversant, in no apparent distress, appears frail and deconditioned Symmetrical Chest wall movement, Good air movement bilaterally, wheezing and rales much improved, no tachypnea, no use of accessory muscles RRR,No Gallops,Rubs or new Murmurs, No Parasternal Heave +ve B.Sounds, Abd Soft, No tenderness, No rebound - guarding or rigidity. No Cyanosis, Clubbing or edema, No new Rash or bruise      Data Reviewed: I have personally reviewed following labs and imaging studies  CBC: Recent Labs  Lab 11/05/22 1059 11/05/22 1359 11/06/22 0331 11/09/22 0501  WBC 11.9*  --  17.3* 8.4  NEUTROABS 10.9*  --   --   --   HGB 10.9* 9.2* 8.4* 10.8*  HCT 36.1 27.0* 27.3* 34.7*  MCV 103.7*  --  98.6 100.0  PLT 333  --  154 224    Basic Metabolic Panel: Recent Labs  Lab 11/05/22 1059 11/05/22 1359 11/06/22 0331 11/09/22 0501  NA 138 138 138 138  K 3.7 4.0 3.9 3.4*  CL 100  --  100 99  CO2 29  --  27 29  GLUCOSE 80  --  122* 129*  BUN 11  --  18 19  CREATININE 1.07*  --  1.12* 0.93  CALCIUM 9.2  --  8.2* 8.2*  MG  --   --   --  1.5*  PHOS  --   --   --  3.1    GFR: Estimated Creatinine Clearance: 35.2 mL/min (by C-G formula based on SCr of 0.93 mg/dL).  Liver Function Tests: Recent Labs  Lab 11/05/22 1059  AST 28  ALT 13  ALKPHOS 107  BILITOT 0.5  PROT 6.7  ALBUMIN 3.1*    CBG: Recent Labs  Lab 11/08/22 2144 11/09/22 0022 11/09/22 0420 11/09/22 0824 11/09/22 1209  GLUCAP 158* 131* 139* 115* 134*     Recent Results (from the past 240 hour(s))  Blood Culture (routine x 2)     Status: None (Preliminary result)   Collection Time: 11/05/22  10:46 AM   Specimen: BLOOD  Result Value Ref Range Status   Specimen Description BLOOD SITE NOT SPECIFIED  Final   Special Requests   Final    BOTTLES DRAWN AEROBIC AND ANAEROBIC Blood Culture adequate volume   Culture   Final    NO GROWTH 4 DAYS Performed at [facility], [address] [place], [place] [address]    Report Status PENDING  Incomplete  Blood Culture (routine x 2)     Status: None (Preliminary result)   Collection Time: 11/05/22 10:51 AM   Specimen: BLOOD  Result Value Ref Range Status   Specimen Description BLOOD SITE NOT SPECIFIED  Final   Special Requests  Final    BOTTLES DRAWN AEROBIC AND ANAEROBIC Blood Culture adequate volume   Culture   Final    NO GROWTH 4 DAYS Performed at [facility], [address], [place], [place] [address]    Report Status PENDING  Incomplete  Resp panel by RT-PCR (RSV, Flu A&B, Covid) Anterior Nasal Swab     Status: None   Collection Time: 11/05/22 10:59 AM   Specimen: Anterior Nasal Swab  Result Value Ref Range Status   SARS Coronavirus 2 by RT PCR NEGATIVE NEGATIVE Final   Influenza A by PCR NEGATIVE NEGATIVE Final   Influenza B by PCR NEGATIVE NEGATIVE Final    Comment: (NOTE) The Xpert Xpress SARS-CoV-2/FLU/RSV plus assay is intended as an aid in the diagnosis of influenza from Nasopharyngeal swab specimens and should not be used as a sole basis for treatment. Nasal washings and aspirates are unacceptable for Xpert Xpress SARS-CoV-2/FLU/RSV testing.  Fact Sheet for Patients: BloggerCourse.com  Fact Sheet for Healthcare Providers: SeriousBroker.it  This test is not yet approved or cleared by the [place] FDA and has been authorized for detection and/or diagnosis of SARS-CoV-2 by FDA under an Emergency Use Authorization (EUA). This EUA will remain in effect (meaning this test can be used) for the duration of the COVID-19 declaration under Section  564(b)(1) of the Act, 21 U.S.C. section 360bbb-3(b)(1), unless the authorization is terminated or revoked.     Resp Syncytial Virus by PCR NEGATIVE NEGATIVE Final    Comment: (NOTE) Fact Sheet for Patients: BloggerCourse.com  Fact Sheet for Healthcare Providers: SeriousBroker.it  This test is not yet approved or cleared by the [place] FDA and has been authorized for detection and/or diagnosis of SARS-CoV-2 by FDA under an Emergency Use Authorization (EUA). This EUA will remain in effect (meaning this test can be used) for the duration of the COVID-19 declaration under Section 564(b)(1) of the Act, 21 U.S.C. section 360bbb-3(b)(1), unless the authorization is terminated or revoked.  Performed at [facility], [address], [place], [place] [address]   Respiratory (~20 pathogens) panel by PCR     Status: None   Collection Time: 11/05/22  1:17 PM   Specimen: Nasopharyngeal Swab; Respiratory  Result Value Ref Range Status   Adenovirus NOT DETECTED NOT DETECTED Final   Coronavirus 229E NOT DETECTED NOT DETECTED Final    Comment: (NOTE) The Coronavirus on the Respiratory Panel, DOES NOT test for the novel  Coronavirus (2019 nCoV)    Coronavirus HKU1 NOT DETECTED NOT DETECTED Final   Coronavirus NL63 NOT DETECTED NOT DETECTED Final   Coronavirus OC43 NOT DETECTED NOT DETECTED Final   Metapneumovirus NOT DETECTED NOT DETECTED Final   Rhinovirus / Enterovirus NOT DETECTED NOT DETECTED Final   Influenza A NOT DETECTED NOT DETECTED Final   Influenza B NOT DETECTED NOT DETECTED Final   Parainfluenza Virus 1 NOT DETECTED NOT DETECTED Final   Parainfluenza Virus 2 NOT DETECTED NOT DETECTED Final   Parainfluenza Virus 3 NOT DETECTED NOT DETECTED Final   Parainfluenza Virus 4 NOT DETECTED NOT DETECTED Final   Respiratory Syncytial Virus NOT DETECTED NOT DETECTED Final   Bordetella pertussis NOT DETECTED NOT DETECTED Final    Bordetella Parapertussis NOT DETECTED NOT DETECTED Final   Chlamydophila pneumoniae NOT DETECTED NOT DETECTED Final   Mycoplasma pneumoniae NOT DETECTED NOT DETECTED Final    Comment: Performed at [facility], [address], [place], [place] [address]  MRSA Next Gen by PCR, Nasal  Status: None   Collection Time: 11/05/22  4:13 PM   Specimen: Nasal Mucosa; Nasal Swab  Result Value Ref Range Status   MRSA by PCR Next Gen NOT DETECTED NOT DETECTED Final    Comment: (NOTE) The GeneXpert MRSA Assay (FDA approved for NASAL specimens only), is one component of a comprehensive MRSA colonization surveillance program. It is not intended to diagnose MRSA infection nor to guide or monitor treatment for MRSA infections. Test performance is not FDA approved in patients less than 41 years old. Performed at [facility], [address] [place], [place] [address]          Radiology Studies: No results found.      Scheduled Meds:  amoxicillin-clavulanate  1 tablet Oral BID   arformoterol  15 mcg Nebulization BID   aspirin EC  81 mg Oral Daily   atorvastatin  20 mg Oral QPM   budesonide (PULMICORT) nebulizer solution  0.25 mg Nebulization BID   carvedilol  6.25 mg Oral BID WC   Chlorhexidine Gluconate Cloth  6 each Topical Q0600   gabapentin  100 mg Oral QHS   heparin  5,000 Units Subcutaneous Q8H   insulin aspart  0-15 Units Subcutaneous TID WC   insulin aspart  0-5 Units Subcutaneous QHS   methylPREDNISolone (SOLU-MEDROL) injection  40 mg Intravenous Q12H   mirtazapine  7.5 mg Oral QHS   montelukast  10 mg Oral QHS   mouth rinse  15 mL Mouth Rinse 4 times per day   pantoprazole  40 mg Oral QHS   revefenacin  175 mcg Nebulization Daily   rOPINIRole  0.5 mg Oral QHS   Continuous Infusions:  sodium chloride Stopped (11/09/22 1237)     LOS: 4 days       Huey Bienenstock, MD [facility]   To contact the attending provider between 7A-7P or the  covering provider during after hours 7P-7A, please log into the web site [email or web address] and access using universal [place] password for that web site. If you do not have the password, please call the hospital operator.  11/09/2022, 12:58 PM

## 2022-11-09 NOTE — Plan of Care (Signed)
  Problem: Education: Goal: Ability to describe self-care measures that may prevent or decrease complications (Diabetes Survival Skills Education) will improve Outcome: Progressing Goal: Individualized Educational Video(s) Outcome: Progressing   Problem: Coping: Goal: Ability to adjust to condition or change in health will improve Outcome: Progressing   

## 2022-11-09 NOTE — Plan of Care (Signed)

## 2022-11-09 NOTE — Plan of Care (Signed)
  Problem: Coping: Goal: Ability to adjust to condition or change in health will improve Outcome: Progressing   Problem: Fluid Volume: Goal: Ability to maintain a balanced intake and output will improve Outcome: Progressing   Problem: Metabolic: Goal: Ability to maintain appropriate glucose levels will improve Outcome: Progressing   Problem: Nutritional: Goal: Progress toward achieving an optimal weight will improve Outcome: Progressing   Problem: Skin Integrity: Goal: Risk for impaired skin integrity will decrease Outcome: Progressing   Problem: Tissue Perfusion: Goal: Adequacy of tissue perfusion will improve Outcome: Progressing   Problem: Education: Goal: Knowledge of General Education information will improve Description: Including pain rating scale, medication(s)/side effects and non-pharmacologic comfort measures Outcome: Progressing   Problem: Clinical Measurements: Goal: Will remain free from infection Outcome: Progressing Goal: Diagnostic test results will improve Outcome: Progressing   Problem: Activity: Goal: Risk for activity intolerance will decrease Outcome: Progressing   Problem: Coping: Goal: Level of anxiety will decrease Outcome: Progressing

## 2022-11-10 DIAGNOSIS — A419 Sepsis, unspecified organism: Secondary | ICD-10-CM | POA: Diagnosis not present

## 2022-11-10 DIAGNOSIS — R6521 Severe sepsis with septic shock: Secondary | ICD-10-CM | POA: Diagnosis not present

## 2022-11-10 LAB — CULTURE, BLOOD (ROUTINE X 2)
Culture: NO GROWTH
Culture: NO GROWTH
Special Requests: ADEQUATE
Special Requests: ADEQUATE

## 2022-11-10 LAB — GLUCOSE, CAPILLARY
Glucose-Capillary: 93 mg/dL (ref 70–99)
Glucose-Capillary: 113 mg/dL — ABNORMAL HIGH (ref 70–99)
Glucose-Capillary: 129 mg/dL — ABNORMAL HIGH (ref 70–99)
Glucose-Capillary: 136 mg/dL — ABNORMAL HIGH (ref 70–99)

## 2022-11-10 MED ORDER — MAGNESIUM SULFATE 2 GM/50ML IV SOLN
2.0000 g | Freq: Once | INTRAVENOUS | Status: AC
  Administered 2022-11-10: 2 g via INTRAVENOUS
  Filled 2022-11-10: qty 50

## 2022-11-10 MED ORDER — PREDNISONE 5 MG PO TABS
10.0000 mg | ORAL_TABLET | Freq: Every day | ORAL | Status: DC
  Administered 2022-11-11: 10 mg via ORAL
  Filled 2022-11-10: qty 2

## 2022-11-10 MED ORDER — GERHARDT'S BUTT CREAM
TOPICAL_CREAM | Freq: Three times a day (TID) | CUTANEOUS | Status: DC
  Filled 2022-11-10: qty 1

## 2022-11-10 NOTE — Progress Notes (Signed)
Physical Therapy Treatment Patient Details Name: Desiree [name] MRN: [id number] DOB: [birth year] Today's Date: 11/10/2022   History of Present Illness Patient is a 77 year old female with acute on chronic respiratory failure due to new bilateral community-acquired pneumonia, associated septic shock. History of  non-small cell lung cancer, rheumatoid arthritis, A-fib, diabetes, chronic hypoxemic respiratory failure on 3-4 L/min.    PT Comments  Patient is agreeable to PT session. She continues to require assistance for bed mobility and transfers. Increased activity tolerance for standing this session. Three standing bouts performed with rest break between bouts of standing. Patient was able to take several side steps along edge of bed with standing tolerance of 30 seconds. Sp02 remained around 92% with activity. The patient is not back to her baseline level of functional independence. Rehabilitation <3 hours/day recommended after this hospital stay.     If plan is discharge home, recommend the following: A lot of help with walking and/or transfers;A lot of help with bathing/dressing/bathroom;Assistance with cooking/housework;Assist for transportation   Can travel by private vehicle     No  Equipment Recommendations  None recommended by PT    Recommendations for Other Services       Precautions / Restrictions Precautions Precautions: Fall Restrictions Weight Bearing Restrictions: No     Mobility  Bed Mobility Overal bed mobility: Needs Assistance Bed Mobility: Supine to Sit, Sit to Supine     Supine to sit: Mod assist, HOB elevated, Used rails Sit to supine: Min assist, Used rails   General bed mobility comments: verbal cues for technique. increased time and effort required    Transfers Overall transfer level: Needs assistance Equipment used: Rolling walker (2 wheels) Transfers: Sit to/from Stand Sit to Stand: Mod assist           General transfer comment: lifting  and lowering assistance provided. cues for technique and safe hand placement. 3 standing bouts performed    Ambulation/Gait             Pre-gait activities: patient is able to take 4 small side steps to the right with Min A for seadying using rolling walker, Min cues for technique. standing tolerance limited for ambulation away from the bed.     Stairs             Wheelchair Mobility     Tilt Bed    Modified Rankin (Stroke Patients Only)       Balance Overall balance assessment: Needs assistance Sitting-balance support: Feet supported Sitting balance-Leahy Scale: Fair     Standing balance support: Bilateral upper extremity supported, During functional activity Standing balance-Leahy Scale: Poor Standing balance comment: reliant on rolling walker for support. CGA- Min A provided from therapist. standing tolerance 3 bouts of 30 seconds                            Cognition Arousal: Alert Behavior During Therapy: WFL for tasks assessed/performed Overall Cognitive Status: Within Functional Limits for tasks assessed                                          Exercises      General Comments General comments (skin integrity, edema, etc.): Sp02 92% with activity. energy conservation strategies discussed with importance for taking rest breaks between bouts of activity. patient is fatigued with mobility.  Pertinent Vitals/Pain Pain Assessment Pain Assessment: Faces Faces Pain Scale: Hurts a little bit Pain Location: perineal area Pain Descriptors / Indicators: Discomfort Pain Intervention(s): Limited activity within patient's tolerance, Monitored during session, Repositioned    Home Living                          Prior Function            PT Goals (current goals can now be found in the care plan section) Acute Rehab PT Goals Patient Stated Goal: to feel better PT Goal Formulation: With patient Time For Goal  Achievement: 11/14/22 Potential to Achieve Goals: Good Progress towards PT goals: Progressing toward goals    Frequency    Min 1X/week      PT Plan      Co-evaluation              AM-PAC PT "6 Clicks" Mobility   Outcome Measure  Help needed turning from your back to your side while in a flat bed without using bedrails?: A Lot Help needed moving from lying on your back to sitting on the side of a flat bed without using bedrails?: A Lot Help needed moving to and from a bed to a chair (including a wheelchair)?: A Lot Help needed standing up from a chair using your arms (e.g., wheelchair or bedside chair)?: A Lot Help needed to walk in hospital room?: A Lot Help needed climbing 3-5 steps with a railing? : Total 6 Click Score: 11    End of Session   Activity Tolerance: Patient limited by fatigue;Patient tolerated treatment well Patient left: in bed;with call bell/phone within reach;with bed alarm set;with nursing/sitter in room (nurse tech in the room)   PT Visit Diagnosis: Unsteadiness on feet (R26.81);Muscle weakness (generalized) (M62.81)     Time: 5409-8119 PT Time Calculation (min) (ACUTE ONLY): 22 min  Charges:    $Therapeutic Activity: 8-22 mins PT General Charges $$ ACUTE PT VISIT: 1 Visit                     Donna Bernard, PT, MPT    Ina Homes 11/10/2022, 1:34 PM

## 2022-11-10 NOTE — Progress Notes (Signed)
PROGRESS NOTE    Desiree [name]  WUJ:[id number] DOB: 23-Oct-1945 DOA: 11/05/2022 PCP: Patient, No Pcp Per   Chief Complaint  Patient presents with   Respiratory Distress    Brief Narrative:   77 year old female with past medical history of paroxysmal atrial fibrillation, alcoholism, CAD, colon cancer, emphysema, CKD III, HTN, lung cancer, RA, Type 2 DM who presents to the emergency department on 11/05/22 with complaint of shortness of breath. Has had several days of shortness of breath and arrived with O2 sat 80% on 5LNC. She uses 3LNC at baseline. Placed on CPAP with improvement. She was febrile to 103.3 with tachycardia. Labs with Cr 1.07, WBC 11.9, hgb 10.9, lactic 2.3. Blood cultures drawn. CXR showing bilateral interstitial opacities. Given 1.5L fluid resuscitation, rocephin/azithro for CAP coverage and started on low dose levophed for hypotension.  Patient was admitted to ICU for pressors requirement, she did require BiPAP initially, she has improved, came off BiPAP, she was transferred to progressive care 11/07/2022.    Assessment & Plan:   Principal Problem:   Septic shock (HCC) Active Problems:   Lung cancer, lower lobe (HCC)   CKD (chronic kidney disease) stage 3, GFR 30-59 ml/min (HCC)   History of pulmonary embolus (PE)   Rheumatoid arthritis (HCC)   Alcoholism (HCC)   Other cirrhosis of liver (HCC)   Cancer of ascending colon (HCC)   Essential hypertension   Chronic pain   Hypothyroidism   Type 2 diabetes mellitus without complications (HCC)   Chronic obstructive pulmonary disease (HCC)   AF (paroxysmal atrial fibrillation) (HCC)   Septic shock 2/2 community acquired pneumonia;  - CXR with bilateral infiltrates, increased O2 requirement, febrile.  -UA without evidence of UTI.  - RVP negative. Legionella  and strep are negative. Procal 56  - off pressors 10/2 - con't rocephin/azithro for CAP coverage, transition to p.o. Augmentin and azithromycin today. 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   -Wean oxygen as tolerated - brovana, budesonide, yupelri ordered - scheduled duoneb -On IV Solu-Medrol initially, currently transitioned to p.o. prednisone from tomorrow(she is on p.o. prednisone 5 mg oral daily at baseline, this is need to be resumed when of high-dose steroids) - pulmonary hygiene, she was encouraged use incentive spirometer and flutter valve.   HTN Combined systolic/diastolic chf: echo 08/2022 lvef 40-45% HLD CAD -Pressure started to increase, she is hypertensive as well, but remains labile, so I will start now on Coreg especially in the setting of sinus tachycardia  -Will add as needed hydralazine -Continue with aspirin and statin.    T2DM -Will keep on insulin sliding scale now she is on steroids.   CKD stage III; baseline Cr ~0.8. today 1.07 - trend bmp  - avoid nephrotoxic agents    Paroxysmal afib  -not on ac due to hx of bleeding -tele monitoring -Resume on Coreg given she is tachycardic   RA Plan: -steroids as above, resume home dose prednisone as of IV steroids -hold home meds for now laying Plaquenil and Arava(right now she is on high-dose steroids, once taper she can be resumed on her home dose Plaquenil and Arava)   Peripheral neuropathy RLS -Resume Requip  Deconditioning. -PT/OT consulted  DVT prophylaxis: Heparin Code Status: DNR Family Communication: None at bedside Disposition: Will need SNF placement, she is medically cleared for  discharge when bed is available.  Status is: Inpatient    Consultants:  PCCM (from ICU)   Subjective:  Significant events overnight, she denies fever, cough or dyspnea, she had some diarrhea.  Objective: Vitals:   11/10/22 0400 11/10/22 0738 11/10/22 0800 11/10/22 1207  BP: (!) 157/75  (!) 147/76  132/76  Pulse: 88  93 87  Resp: (!) 23  18 18   Temp: 97.9 F (36.6 C)  98.6 F (37 C) (!) 97.4 F (36.3 C)  TempSrc: Axillary  Oral Oral  SpO2: 99% 100% 93% 94%  Weight:      Height:        Intake/Output Summary (Last 24 hours) at 11/10/2022 1434 Last data filed at 11/10/2022 0904 Gross per 24 hour  Intake 290 ml  Output --  Net 290 ml    Filed Weights   11/05/22 1036 11/06/22 0708 11/07/22 0438  Weight: 46.8 kg 51.7 kg 47.7 kg    Examination:  Awake Alert, does not, conversant, no apparent distress Symmetrical Chest wall movement, mildly diminished air entry bilaterally, wheezing has resolved RRR,No Gallops,Rubs or new Murmurs, No Parasternal Heave +ve B.Sounds, Abd Soft, No tenderness, No rebound - guarding or rigidity. No Cyanosis, Clubbing or edema, No new Rash or bruise     Data Reviewed: I have personally reviewed following labs and imaging studies  CBC: Recent Labs  Lab 11/05/22 1059 11/05/22 1359 11/06/22 0331 11/09/22 0501  WBC 11.9*  --  17.3* 8.4  NEUTROABS 10.9*  --   --   --   HGB 10.9* 9.2* 8.4* 10.8*  HCT 36.1 27.0* 27.3* 34.7*  MCV 103.7*  --  98.6 100.0  PLT 333  --  154 224    Basic Metabolic Panel: Recent Labs  Lab 11/05/22 1059 11/05/22 1359 11/06/22 0331 11/09/22 0501  NA 138 138 138 138  K 3.7 4.0 3.9 3.4*  CL 100  --  100 99  CO2 29  --  27 29  GLUCOSE 80  --  122* 129*  BUN 11  --  18 19  CREATININE 1.07*  --  1.12* 0.93  CALCIUM 9.2  --  8.2* 8.2*  MG  --   --   --  1.5*  PHOS  --   --   --  3.1    GFR: Estimated Creatinine Clearance: 35.2 mL/min (by C-G formula based on SCr of 0.93 mg/dL).  Liver Function Tests: Recent Labs  Lab 11/05/22 1059  AST 28  ALT 13  ALKPHOS 107  BILITOT 0.5  PROT 6.7  ALBUMIN 3.1*    CBG: Recent Labs  Lab 11/09/22 1209 11/09/22 1617 11/09/22 2202 11/10/22 0744 11/10/22 1137  GLUCAP 134* 138* 151* 93 113*     Recent Results (from the past 240 hour(s))  Blood Culture  (routine x 2)     Status: None   Collection Time: 11/05/22 10:46 AM   Specimen: BLOOD  Result Value Ref Range Status   Specimen Description BLOOD SITE NOT SPECIFIED  Final   Special Requests   Final    BOTTLES DRAWN AEROBIC AND ANAEROBIC Blood Culture adequate volume   Culture   Final    NO GROWTH 5 DAYS Performed at [facility], [address] [place], [place] [address]    Report Status 11/10/2022 FINAL  Final  Blood Culture (routine x 2)     Status: None   Collection Time: 11/05/22 10:51 AM   Specimen: BLOOD  Result Value Ref Range  Status   Specimen Description BLOOD SITE NOT SPECIFIED  Final   Special Requests   Final    BOTTLES DRAWN AEROBIC AND ANAEROBIC Blood Culture adequate volume   Culture   Final    NO GROWTH 5 DAYS Performed at [facility], [address] [place], [place] [address]    Report Status 11/10/2022 FINAL  Final  Resp panel by RT-PCR (RSV, Flu A&B, Covid) Anterior Nasal Swab     Status: None   Collection Time: 11/05/22 10:59 AM   Specimen: Anterior Nasal Swab  Result Value Ref Range Status   SARS Coronavirus 2 by RT PCR NEGATIVE NEGATIVE Final   Influenza A by PCR NEGATIVE NEGATIVE Final   Influenza B by PCR NEGATIVE NEGATIVE Final    Comment: (NOTE) The Xpert Xpress SARS-CoV-2/FLU/RSV plus assay is intended as an aid in the diagnosis of influenza from Nasopharyngeal swab specimens and should not be used as a sole basis for treatment. Nasal washings and aspirates are unacceptable for Xpert Xpress SARS-CoV-2/FLU/RSV testing.  Fact Sheet for Patients: BloggerCourse.com  Fact Sheet for Healthcare Providers: SeriousBroker.it  This test is not yet approved or cleared by the [place] FDA and has been authorized for detection and/or diagnosis of SARS-CoV-2 by FDA under an Emergency Use Authorization (EUA). This EUA will remain in effect (meaning this test can be used) for the duration of  the COVID-19 declaration under Section 564(b)(1) of the Act, 21 U.S.C. section 360bbb-3(b)(1), unless the authorization is terminated or revoked.     Resp Syncytial Virus by PCR NEGATIVE NEGATIVE Final    Comment: (NOTE) Fact Sheet for Patients: BloggerCourse.com  Fact Sheet for Healthcare Providers: SeriousBroker.it  This test is not yet approved or cleared by the [place] FDA and has been authorized for detection and/or diagnosis of SARS-CoV-2 by FDA under an Emergency Use Authorization (EUA). This EUA will remain in effect (meaning this test can be used) for the duration of the COVID-19 declaration under Section 564(b)(1) of the Act, 21 U.S.C. section 360bbb-3(b)(1), unless the authorization is terminated or revoked.  Performed at [facility], [address] [place], [place] [address]   Respiratory (~20 pathogens) panel by PCR     Status: None   Collection Time: 11/05/22  1:17 PM   Specimen: Nasopharyngeal Swab; Respiratory  Result Value Ref Range Status   Adenovirus NOT DETECTED NOT DETECTED Final   Coronavirus 229E NOT DETECTED NOT DETECTED Final    Comment: (NOTE) The Coronavirus on the Respiratory Panel, DOES NOT test for the novel  Coronavirus (2019 nCoV)    Coronavirus HKU1 NOT DETECTED NOT DETECTED Final   Coronavirus NL63 NOT DETECTED NOT DETECTED Final   Coronavirus OC43 NOT DETECTED NOT DETECTED Final   Metapneumovirus NOT DETECTED NOT DETECTED Final   Rhinovirus / Enterovirus NOT DETECTED NOT DETECTED Final   Influenza A NOT DETECTED NOT DETECTED Final   Influenza B NOT DETECTED NOT DETECTED Final   Parainfluenza Virus 1 NOT DETECTED NOT DETECTED Final   Parainfluenza Virus 2 NOT DETECTED NOT DETECTED Final   Parainfluenza Virus 3 NOT DETECTED NOT DETECTED Final   Parainfluenza Virus 4 NOT DETECTED NOT DETECTED Final   Respiratory Syncytial Virus NOT DETECTED NOT DETECTED Final   Bordetella  pertussis NOT DETECTED NOT DETECTED Final   Bordetella Parapertussis NOT DETECTED NOT DETECTED Final   Chlamydophila pneumoniae NOT DETECTED NOT DETECTED Final   Mycoplasma pneumoniae NOT DETECTED NOT DETECTED Final    Comment: Performed at  [facility], [address] [place], [place] [address]  MRSA Next Gen by PCR, Nasal     Status: None   Collection Time: 11/05/22  4:13 PM   Specimen: Nasal Mucosa; Nasal Swab  Result Value Ref Range Status   MRSA by PCR Next Gen NOT DETECTED NOT DETECTED Final    Comment: (NOTE) The GeneXpert MRSA Assay (FDA approved for NASAL specimens only), is one component of a comprehensive MRSA colonization surveillance program. It is not intended to diagnose MRSA infection nor to guide or monitor treatment for MRSA infections. Test performance is not FDA approved in patients less than 68 years old. Performed at [facility], [address] [place], [place] [address]          Radiology Studies: No results found.      Scheduled Meds:  arformoterol  15 mcg Nebulization BID   aspirin EC  81 mg Oral Daily   atorvastatin  20 mg Oral QPM   budesonide (PULMICORT) nebulizer solution  0.25 mg Nebulization BID   carvedilol  6.25 mg Oral BID WC   Chlorhexidine Gluconate Cloth  6 each Topical Q0600   gabapentin  100 mg Oral QHS   heparin  5,000 Units Subcutaneous Q8H   insulin aspart  0-15 Units Subcutaneous TID WC   insulin aspart  0-5 Units Subcutaneous QHS   mirtazapine  7.5 mg Oral QHS   montelukast  10 mg Oral QHS   mouth rinse  15 mL Mouth Rinse 4 times per day   pantoprazole  40 mg Oral QHS   predniSONE  40 mg Oral Q breakfast   revefenacin  175 mcg Nebulization Daily   rOPINIRole  0.5 mg Oral QHS   Continuous Infusions:     LOS: 5 days       Huey Bienenstock, MD [facility]   To contact the attending provider between 7A-7P or the covering provider during after hours 7P-7A, please log into the web site  [email or web address] and access using universal [place] password for that web site. If you do not have the password, please call the hospital operator.  11/10/2022, 2:34 PM

## 2022-11-10 NOTE — TOC Progression Note (Signed)
Transition of Care [facility]) - Progression Note    Patient Details  Name: Desiree [name] MRN: [id number] Date of Birth: [birth year]  [facility]) CM/SW Contact  Marliss Coots, LCSW Phone Number: 11/10/2022, 2:02 PM  Clinical Narrative:     This MSW called the patients daughter, Ginnette Gates, to relay [employer] approval for rehab upon discharge. Christy expressed concerns of health relapse once Bryanne transitions back to Assisted Living. Christy requested to speak with medical team for further discussion, however, verbally consented to insurance authorization for SNF.    Expected Discharge Plan: Skilled Nursing Facility Barriers to Discharge: SNF Pending bed offer, Continued Medical Work up, English as a second language teacher  Expected Discharge Plan and Services In-house Referral: Clinical Social Work Discharge Planning Services: [employer] Consult Post Acute Care Choice: Skilled Nursing Facility Living arrangements for the past 2 months: Assisted Living Facility                   DME Agency: NA                   Social Determinants of Health (SDOH) Interventions SDOH Screenings   Food Insecurity: No Food Insecurity (08/25/2022)  Housing: Low Risk  (08/25/2022)  Transportation Needs: No Transportation Needs (08/25/2022)  Utilities: Not At Risk (08/25/2022)  Financial Resource Strain: Low Risk  (10/26/2020)  Social Connections: Socially Isolated (10/26/2020)  Tobacco Use: Medium Risk (11/05/2022)    Readmission Risk Interventions    11/06/2022   12:45 PM 08/27/2022   12:09 PM 08/26/2022    9:53 AM  Readmission Risk Prevention Plan  Transportation Screening Complete Complete   Medication Review (RN Care Manager) Referral to Pharmacy Complete Complete  PCP or Specialist appointment within 3-5 days of discharge Complete Complete   HRI or Home Care Consult Complete Complete Complete  SW Recovery Care/Counseling Consult Complete Complete   Palliative Care Screening Not  Applicable Not Applicable Not Applicable  Skilled Nursing Facility Not Applicable Not Applicable Not Applicable

## 2022-11-10 NOTE — Care Management Important Message (Signed)
Important Message  Patient Details  Name: Desiree [name] MRN: [id number] Date of Birth: [birth year]   Important Message Given:  Yes - Medicare IM     Dorena Bodo 11/10/2022, 2:34 PM

## 2022-11-10 NOTE — TOC Progression Note (Addendum)
Transition of Care [facility]) - Progression Note    Patient Details  Name: Desiree [name] MRN: [id number] Date of Birth: 23-Jul-1945  [facility]) CM/SW Contact  Mearl Latin, LCSW Phone Number: 11/10/2022, 12:30 PM  Clinical Narrative:    [facility] able to accept patient tomorrow pending insurance approval. CSW will initiate authorization process and update daughter.   Update: Insurance authorization started by [employer], Ref# [id number]. CSW uploaded the additional clinicals requested.    Expected Discharge Plan: Skilled Nursing Facility Barriers to Discharge: Continued Medical Work up, SNF Pending bed offer, English as a second language teacher  Expected Discharge Plan and Services In-house Referral: Clinical Social Work Discharge Planning Services: CM Consult Post Acute Care Choice: Skilled Nursing Facility Living arrangements for the past 2 months: Assisted Living Facility                                       Social Determinants of Health (SDOH) Interventions SDOH Screenings   Food Insecurity: No Food Insecurity (08/25/2022)  Housing: Low Risk  (08/25/2022)  Transportation Needs: No Transportation Needs (08/25/2022)  Utilities: Not At Risk (08/25/2022)  Financial Resource Strain: Low Risk  (10/26/2020)  Social Connections: Socially Isolated (10/26/2020)  Tobacco Use: Medium Risk (11/05/2022)    Readmission Risk Interventions    11/06/2022   12:45 PM 08/27/2022   12:09 PM 08/26/2022    9:53 AM  Readmission Risk Prevention Plan  Transportation Screening Complete Complete   Medication Review (RN Care Manager) Referral to Pharmacy Complete Complete  PCP or Specialist appointment within 3-5 days of discharge Complete Complete   HRI or Home Care Consult Complete Complete Complete  SW Recovery Care/Counseling Consult Complete Complete   Palliative Care Screening Not Applicable Not Applicable Not Applicable  Skilled Nursing Facility Not Applicable Not Applicable Not  Applicable

## 2022-11-11 DIAGNOSIS — J441 Chronic obstructive pulmonary disease with (acute) exacerbation: Secondary | ICD-10-CM | POA: Diagnosis not present

## 2022-11-11 DIAGNOSIS — R6521 Severe sepsis with septic shock: Secondary | ICD-10-CM | POA: Diagnosis not present

## 2022-11-11 DIAGNOSIS — I480 Paroxysmal atrial fibrillation: Secondary | ICD-10-CM | POA: Diagnosis not present

## 2022-11-11 DIAGNOSIS — A419 Sepsis, unspecified organism: Secondary | ICD-10-CM | POA: Diagnosis not present

## 2022-11-11 LAB — GLUCOSE, CAPILLARY
Glucose-Capillary: 88 mg/dL (ref 70–99)
Glucose-Capillary: 144 mg/dL — ABNORMAL HIGH (ref 70–99)

## 2022-11-11 MED ORDER — AMOXICILLIN-POT CLAVULANATE 500-125 MG PO TABS
1.0000 | ORAL_TABLET | Freq: Two times a day (BID) | ORAL | Status: DC
  Administered 2022-11-11: 1 via ORAL
  Filled 2022-11-11 (×2): qty 1

## 2022-11-11 MED ORDER — TRAMADOL HCL 50 MG PO TABS: 50.0000 mg | ORAL_TABLET | Freq: Three times a day (TID) | ORAL | 0 refills | Status: DC | PRN

## 2022-11-11 MED ORDER — PREDNISONE 5 MG PO TABS: 5.0000 mg | ORAL_TABLET | Freq: Every day | ORAL | Status: DC

## 2022-11-11 MED ORDER — AMOXICILLIN-POT CLAVULANATE 500-125 MG PO TABS: 1.0000 | ORAL_TABLET | Freq: Two times a day (BID) | ORAL | Status: AC

## 2022-11-11 MED ORDER — PREDNISONE 10 MG PO TABS: 20.0000 mg | ORAL_TABLET | Freq: Every day | ORAL | Status: AC

## 2022-11-11 NOTE — Discharge Instructions (Signed)
Follow with Primary MD /SNF physician ? ?Get CBC, CMP, 2 view Chest X ray checked  by Primary MD next visit.  ? ? ?Activity: As tolerated with Full fall precautions use walker/cane & assistance as needed ? ? ?Disposition SNF ? ? ?Diet: Heart Healthy  ? ? ?On your next visit with your primary care physician please Get Medicines reviewed and adjusted. ? ? ?Please request your Prim.MD to go over all Hospital Tests and Procedure/Radiological results at the follow up, please get all Hospital records sent to your Prim MD by signing hospital release before you go home. ? ? ?If you experience worsening of your admission symptoms, develop shortness of breath, life threatening emergency, suicidal or homicidal thoughts you must seek medical attention immediately by calling 911 or calling your MD immediately  if symptoms less severe. ? ?You Must read complete instructions/literature along with all the possible adverse reactions/side effects for all the Medicines you take and that have been prescribed to you. Take any new Medicines after you have completely understood and accpet all the possible adverse reactions/side effects.  ? ?Do not drive, operating heavy machinery, perform activities at heights, swimming or participation in water activities or provide baby sitting services if your were admitted for syncope or siezures until you have seen by Primary MD or a Neurologist and advised to do so again. ? ?Do not drive when taking Pain medications.  ? ? ?Do not take more than prescribed Pain, Sleep and Anxiety Medications ? ?Special Instructions: If you have smoked or chewed Tobacco  in the last 2 yrs please stop smoking, stop any regular Alcohol  and or any Recreational drug use. ? ?Wear Seat belts while driving. ? ? ?Please note ? ?You were cared for by a hospitalist during your hospital stay. If you have any questions about your discharge medications or the care you received while you were in the hospital after you are  discharged, you can call the unit and asked to speak with the hospitalist on call if the hospitalist that took care of you is not available. Once you are discharged, your primary care physician will handle any further medical issues. Please note that NO REFILLS for any discharge medications will be authorized once you are discharged, as it is imperative that you return to your primary care physician (or establish a relationship with a primary care physician if you do not have one) for your aftercare needs so that they can reassess your need for medications and monitor your lab values.  ?

## 2022-11-11 NOTE — TOC Progression Note (Addendum)
Transition of Care [facility]) - Progression Note    Patient Details  Name: Lessie Funderburke MRN: [id number] Date of Birth: [birth year]  [facility]) CM/SW Contact  Mearl Latin, LCSW Phone Number: 11/11/2022, 9:06 AM  Clinical Narrative:    Insurance approval received for [facility] SNF, Ref# [id number], Auth ID# [id number], effective 11/10/2022-11/12/2022.  CSW spoke with patient's daughter and provided update. She requested [facility] for ambulance.    Expected Discharge Plan: Skilled Nursing Facility Barriers to Discharge: Barriers Resolved  Expected Discharge Plan and Services In-house Referral: Clinical Social Work Discharge Planning Services: CM Consult Post Acute Care Choice: Skilled Nursing Facility Living arrangements for the past 2 months: Assisted Living Facility                   DME Agency: NA                   Social Determinants of Health (SDOH) Interventions SDOH Screenings   Food Insecurity: No Food Insecurity (08/25/2022)  Housing: Low Risk  (08/25/2022)  Transportation Needs: No Transportation Needs (08/25/2022)  Utilities: Not At Risk (08/25/2022)  Financial Resource Strain: Low Risk  (10/26/2020)  Social Connections: Socially Isolated (10/26/2020)  Tobacco Use: Medium Risk (11/05/2022)    Readmission Risk Interventions    11/06/2022   12:45 PM 08/27/2022   12:09 PM 08/26/2022    9:53 AM  Readmission Risk Prevention Plan  Transportation Screening Complete Complete   Medication Review (RN Care Manager) Referral to Pharmacy Complete Complete  PCP or Specialist appointment within 3-5 days of discharge Complete Complete   HRI or Home Care Consult Complete Complete Complete  SW Recovery Care/Counseling Consult Complete Complete   Palliative Care Screening Not Applicable Not Applicable Not Applicable  Skilled Nursing Facility Not Applicable Not Applicable Not Applicable

## 2022-11-11 NOTE — TOC Transition Note (Addendum)
Transition of Care [facility]) - CM/SW Discharge Note   Patient Details  Name: Desiree [name] MRN: [id number] Date of Birth: 1945/08/05  Transition of Care [facility]) CM/SW Contact:  Mearl Latin, LCSW Phone Number: 11/11/2022, 12:06 PM   Clinical Narrative:    Patient will DC to: [facility] [place] [facility] Anticipated DC date: 11/11/22 Family notified: Daughter, [employer] fast streamer by: Sharin Mons   Per MD patient ready for DC to [facility]. RN to call report prior to discharge [phone] room [phone]). RN, patient, patient's family, and facility notified of DC. Discharge Summary and FL2 sent to facility. DC packet on chart including signed DNR and script. Ambulance transport requested for patient. Family to drop off clothing this afternoon at [facility].  CSW will sign off for now as social work intervention is no longer needed. Please consult [place] again if new needs arise.     Final next level of care: Skilled Nursing Facility Barriers to Discharge: Barriers Resolved   Patient Goals and CMS Choice CMS Medicare.gov Compare Post Acute Care list provided to:: Patient Represenative (must comment) Choice offered to / list presented to : Adult Children  Discharge Placement     Existing PASRR number confirmed : 11/11/22          Patient chooses bed at: [facility] [place] Patient to be transferred to facility by: [facility] Name of family member notified: Daughter Patient and family notified of of transfer: 11/11/22  Discharge Plan and Services Additional resources added to the After Visit Summary for   In-house Referral: Clinical Social Work Discharge Planning Services: CM Consult Post Acute Care Choice: Skilled Nursing Facility            DME Agency: NA                  Social Determinants of Health (SDOH) Interventions SDOH Screenings   Food Insecurity: No Food Insecurity (08/25/2022)  Housing: Low Risk  (08/25/2022)  Transportation Needs: No Transportation Needs (08/25/2022)  Utilities: Not  At Risk (08/25/2022)  Financial Resource Strain: Low Risk  (10/26/2020)  Social Connections: Socially Isolated (10/26/2020)  Tobacco Use: Medium Risk (11/05/2022)     Readmission Risk Interventions    11/06/2022   12:45 PM 08/27/2022   12:09 PM 08/26/2022    9:53 AM  Readmission Risk Prevention Plan  Transportation Screening Complete Complete   Medication Review (RN Care Manager) Referral to Pharmacy Complete Complete  PCP or Specialist appointment within 3-5 days of discharge Complete Complete   HRI or Home Care Consult Complete Complete Complete  SW Recovery Care/Counseling Consult Complete Complete   Palliative Care Screening Not Applicable Not Applicable Not Applicable  Skilled Nursing Facility Not Applicable Not Applicable Not Applicable

## 2022-11-11 NOTE — Plan of Care (Signed)
  Problem: Education: Goal: Ability to describe self-care measures that may prevent or decrease complications (Diabetes Survival Skills Education) will improve Outcome: Progressing   Problem: Coping: Goal: Ability to adjust to condition or change in health will improve Outcome: Progressing   Problem: Fluid Volume: Goal: Ability to maintain a balanced intake and output will improve Outcome: Progressing   Problem: Health Behavior/Discharge Planning: Goal: Ability to manage health-related needs will improve Outcome: Progressing   Problem: Nutritional: Goal: Maintenance of adequate nutrition will improve Outcome: Progressing   Problem: Skin Integrity: Goal: Risk for impaired skin integrity will decrease Outcome: Progressing   Problem: Education: Goal: Knowledge of General Education information will improve Description: Including pain rating scale, medication(s)/side effects and non-pharmacologic comfort measures Outcome: Progressing   Problem: Clinical Measurements: Goal: Will remain free from infection Outcome: Progressing Goal: Diagnostic test results will improve Outcome: Progressing   Problem: Activity: Goal: Risk for activity intolerance will decrease Outcome: Progressing   Problem: Nutrition: Goal: Adequate nutrition will be maintained Outcome: Progressing   Problem: Coping: Goal: Level of anxiety will decrease Outcome: Progressing

## 2022-11-11 NOTE — Discharge Summary (Signed)
Physician Discharge Summary  Desiree [name] VQQ:[id number] DOB: 1945-08-14 DOA: 11/05/2022  PCP: Patient, No Pcp Per  Admit date: 11/05/2022 Discharge date: 11/11/2022  Admitted From: ( ALF) Disposition:  (SNF)  Recommendations for Outpatient Follow-up:  Please check CBC, BMP in 72 hours Please keep encouraging to use incentive spirometry and flutter valve   Diet recommendation: Heart Healthy / Carb Modified   Code status: DNR, confirmed by daughter them of discharge  Brief/Interim Summary:  77 year old female with past medical history of paroxysmal atrial fibrillation, alcoholism, CAD, colon cancer, emphysema, CKD III, HTN, lung cancer, RA, Type 2 DM who presents to the emergency department on 11/05/22 with complaint of shortness of breath. Has had several days of shortness of breath and arrived with O2 sat 80% on 5LNC. She uses 3LNC at baseline. Placed on CPAP with improvement. She was febrile to 103.3 with tachycardia. Labs with Cr 1.07, WBC 11.9, hgb 10.9, lactic 2.3. Blood cultures drawn. CXR showing bilateral interstitial opacities. Given 1.5L fluid resuscitation, rocephin/azithro for CAP coverage and started on low dose levophed for hypotension. Patient was admitted to ICU for pressors requirement, she did require BiPAP initially, she has improved, came off BiPAP, she was transferred to progressive care 11/07/2022 , she finished her antibiotic treatment, IV steroids, she is transition to prednisone taper, and she is stable for discharge.     Septic shock 2/2 community acquired pneumonia;  - CXR with bilateral infiltrates, increased O2 requirement, febrile.  Admitted to ICU, requiring pressor support, she was treated with IV Rocephin and azithromycin for community-acquired pneumonia coverage, she was transitioned to p.o. regimen, given her immunocompromise status, will continue Augmentin for total of 7 days, another 2 days as an outpatient. -UA without evidence of UTI.  - RVP negative.  Legionella  and strep are negative. 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 -Resume home medications, continue with as needed nebs -Would be discharged on prednisone 20 mg p.o. daily x 3 days then she can go back to her baseline prednisone 5 mg oral daily - pulmonary hygiene, she was encouraged use incentive spirometer and flutter valve.   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   -Continue with aspirin and statin.   T2DM -She is not on any home medications, she was kept on SSI During hospital stay given some hyperglycemia related to steroids use.    CKD stage III Renal function at baseline   Paroxysmal afib  -not on ac due to hx of bleeding -Continue with Coreg for heart rate control  RA -steroids as above, resume home dose prednisone 5 mg oral daily in 3 days, -Arava and Plaquenil held during hospital stay given infection, sepsis has resolved at time of discharge, so these meds can be resumed.    Peripheral neuropathy RLS -Resume Requip   Deconditioning. -PT/OT consulted, recommendation for SNF placement  Pressure injury: Pressure Injury 11/05/22 Heel Posterior;Right Deep Tissue Pressure Injury - Purple or maroon localized area of discolored intact skin or blood-filled blister due to damage of underlying soft tissue  from pressure and/or shear. (Active)  11/05/22 1615  Location: Heel  Location Orientation: Posterior;Right  Staging: Deep Tissue Pressure Injury - Purple or maroon  localized area of discolored intact skin or blood-filled blister due to damage of underlying soft tissue from pressure and/or shear.  Wound Description (Comments):   Present on Admission: Yes      Discharge Diagnoses:  Principal Problem:   Septic shock (HCC) Active Problems:   Lung cancer, lower lobe (HCC)   CKD (chronic kidney disease) stage 3, GFR 30-59 ml/min (HCC)   History of pulmonary embolus (PE)   Rheumatoid arthritis (HCC)   Alcoholism (HCC)   Other cirrhosis of liver (HCC)   Cancer of ascending colon (HCC)   Essential hypertension   Chronic pain   Hypothyroidism   Type 2 diabetes mellitus without complications (HCC)   Chronic obstructive pulmonary disease (HCC)   AF (paroxysmal atrial fibrillation) (HCC)    Discharge Instructions  Discharge Instructions     Diet - low sodium heart healthy   Complete by: As directed    Discharge instructions   Complete by: As directed    Follow with Primary MD/SNF physician  Get CBC, CMP, 2 view Chest X ray checked  by Primary MD next visit.    Activity: As tolerated with Full fall precautions use walker/cane & assistance as needed   Disposition SNF   Diet: Heart Healthy    On your next visit with your primary care physician please Get Medicines reviewed and adjusted.   Please request your Prim.MD to go over all Hospital Tests and Procedure/Radiological results at the follow up, please get all Hospital records sent to your Prim MD by signing hospital release before you go home.   If you experience worsening of your admission symptoms, develop shortness of breath, life threatening emergency, suicidal or homicidal thoughts you must seek medical attention immediately by calling 911 or calling your MD immediately  if symptoms less severe.  You Must read complete instructions/literature along with all the possible adverse reactions/side effects for all the Medicines you take and that have been prescribed to you. Take any  new Medicines after you have completely understood and accpet all the possible adverse reactions/side effects.   Do not drive, operating heavy machinery, perform activities at heights, swimming or participation in water activities or provide baby sitting services if your were admitted for syncope or siezures until you have seen by Primary MD or a Neurologist and advised to do so again.  Do not drive when taking Pain medications.    Do not take more than prescribed Pain, Sleep and Anxiety Medications  Special Instructions: If you have smoked or chewed Tobacco  in the last 2 yrs please stop smoking, stop any regular Alcohol  and or any Recreational drug use.  Wear Seat belts while driving.   Please note  You were cared for by a hospitalist during your hospital stay. If you have any questions about your discharge medications or the care you received while you were in the hospital after you are discharged, you can call the unit and asked to speak with the hospitalist on call if the hospitalist that took care of you is not available. Once you are discharged, your primary care physician will handle any further medical issues. Please note that NO REFILLS for any discharge medications will be authorized once you are discharged, as it is imperative that you return to your primary care physician (or establish a relationship with a primary care  physician if you do not have one) for your aftercare needs so that they can reassess your need for medications and monitor your lab values.   Increase activity slowly   Complete by: As directed    No wound care   Complete by: As directed       Allergies as of 11/11/2022       Reactions   Other Shortness Of Breath   Cats   Oxycodone Other (See Comments)   hallucinations        Medication List     TAKE these medications    acetaminophen 325 MG tablet Commonly known as: TYLENOL Take 325 mg by mouth every 6 (six) hours as needed for moderate pain or  headache.   albuterol 108 (90 Base) MCG/ACT inhaler Commonly known as: VENTOLIN HFA Inhale 2 puffs into the lungs every 4 (four) hours as needed for shortness of breath (shortness of breath (related to COPD)).   amLODipine 5 MG tablet Commonly known as: NORVASC Take 1 tablet (5 mg total) by mouth daily. Patient needs appointment for further refills. 1 st attempt   Aspercreme Lidocaine 4 % Generic drug: lidocaine Place 1 patch onto the skin daily as needed (muscle pain).   aspirin EC 81 MG tablet Take 81 mg by mouth daily.   atorvastatin 20 MG tablet Commonly known as: LIPITOR Take 1 tablet (20 mg total) by mouth daily. Patient needs to keep appointment for further refills. 1 st attempt What changed: when to take this   Breztri Aerosphere 160-9-4.8 MCG/ACT Aero Generic drug: Budeson-Glycopyrrol-Formoterol Inhale 2 puffs into the lungs 2 (two) times daily.   carvedilol 6.25 MG tablet Commonly known as: COREG Take 1 tablet (6.25 mg total) by mouth 2 (two) times daily with a meal.   cholecalciferol 25 MCG (1000 UNIT) tablet Commonly known as: VITAMIN D3 Take 1,000 Units by mouth in the morning and at bedtime.   cyanocobalamin 1000 MCG tablet Commonly known as: VITAMIN B12 Take 1,000 mcg by mouth daily.   fexofenadine 180 MG tablet Commonly known as: ALLEGRA Take 180 mg by mouth daily.   gabapentin 100 MG capsule Commonly known as: NEURONTIN Take 100 mg by mouth 2 (two) times daily.   hydroxychloroquine 200 MG tablet Commonly known as: PLAQUENIL Take 200 mg by mouth daily.   Iron 325 (65 Fe) MG Tabs Take 1 tablet by mouth daily.   leflunomide 10 MG tablet Commonly known as: ARAVA Take 10 mg by mouth daily.   loperamide 2 MG tablet Commonly known as: IMODIUM A-D Take 4 mg by mouth as needed for diarrhea or loose stools (MAX 10 PILLS/24 HRS.).   meclizine 12.5 MG tablet Commonly known as: ANTIVERT Take 12.5 mg by mouth 2 (two) times daily as needed for  dizziness.   mirtazapine 7.5 MG tablet Commonly known as: REMERON Take 7.5 mg by mouth at bedtime.   montelukast 10 MG tablet Commonly known as: SINGULAIR Take 1 tablet (10 mg total) by mouth at bedtime.   omeprazole 20 MG capsule Commonly known as: PRILOSEC Take 1 capsule (20 mg total) by mouth daily.   ondansetron 4 MG disintegrating tablet Commonly known as: ZOFRAN-ODT Take 1 tablet (4 mg total) by mouth every 8 (eight) hours as needed for nausea or vomiting.   Oystercal-D 500-10 MG-MCG Tabs Generic drug: Calcium Carb-Cholecalciferol Take 1 tablet by mouth daily.   predniSONE 10 MG tablet Commonly known as: DELTASONE Take 2 tablets (20 mg total) by mouth daily with breakfast for  2 days. What changed: You were already taking a medication with the same name, and this prescription was added. Make sure you understand how and when to take each.   predniSONE 5 MG tablet Commonly known as: DELTASONE Take 1 tablet (5 mg total) by mouth daily with breakfast. Start taking on: November 14, 2022 What changed: These instructions start on November 14, 2022. If you are unsure what to do until then, ask your doctor or other care provider.   rOPINIRole 0.5 MG tablet Commonly known as: REQUIP Take 0.5 mg by mouth at bedtime.   Sertraline HCl 150 MG Caps Take 1 capsule by mouth daily.   Stool Softener/Laxative 50-8.6 MG tablet Generic drug: senna-docusate Take 1 tablet by mouth at bedtime.   tizanidine 2 MG capsule Commonly known as: ZANAFLEX Take 2 mg by mouth 3 (three) times daily.   traMADol 50 MG tablet Commonly known as: ULTRAM Take 1 tablet (50 mg total) by mouth 3 (three) times daily as needed for moderate pain (pain). What changed:  when to take this reasons to take this   valsartan 320 MG tablet Commonly known as: DIOVAN Take 320 mg by mouth in the morning.        Contact information for after-discharge care     Destination     [facility] Preferred SNF .   Service: Skilled Nursing Contact information: [address] [place] [address] [phone]                    Allergies  Allergen Reactions   Other Shortness Of Breath    Cats   Oxycodone Other (See Comments)    hallucinations    Consultations: PCCM /ICU admission  Procedures/Studies: DG Chest Port 1 View  Result Date: 11/05/2022 CLINICAL DATA:  Respiratory distress EXAM: PORTABLE CHEST 1 VIEW COMPARISON:  Chest radiograph dated 10/09/2022 FINDINGS: Patient is rotated slightly to the right. Mildly low lung volumes. Increased bilateral interstitial and bibasilar patchy opacities. No pleural effusion or pneumothorax. The heart size and mediastinal contours are within normal limits. Similar appearance of the right shoulder, likely related to sequela of prior injury. IMPRESSION: Increased bilateral interstitial and bibasilar patchy opacities, which may represent edema and/or superimposed infection. Electronically Signed   By: Agustin Cree M.D.   On: 11/05/2022 11:49      Subjective:  No significant events overnight, she denies any complaints today   Discharge Exam: Vitals:   11/11/22 0830 11/11/22 0855  BP: (!) 152/82   Pulse: (!) 105 (!) 105  Resp: (!) 21 20  Temp:    SpO2: 95% 96%   Vitals:   11/10/22 2000 11/11/22 0000 11/11/22 0830 11/11/22 0855  BP: (!) 142/85 130/65 (!) 152/82   Pulse: 84 81 (!) 105 (!) 105  Resp: 20 16 (!) 21 20  Temp: 98.4 F (36.9 C) 97.8 F (36.6 C)    TempSrc: Oral Oral    SpO2: 97% 94% 95% 96%  Weight:      Height:        General: Pt is alert, awake, not in acute distress, frail, deconditioned Cardiovascular: RRR, S1/S2 +, no rubs, no gallops Respiratory: Air entry today, no wheezing Abdominal: Soft, NT, ND, bowel sounds + Extremities: no edema, no cyanosis    The results of significant diagnostics from this hospitalization (including imaging, microbiology, ancillary and  laboratory) are listed below for reference.     Microbiology: Recent Results (from the past 240  hour(s))  Blood Culture (routine x 2)     Status: None   Collection Time: 11/05/22 10:46 AM   Specimen: BLOOD  Result Value Ref Range Status   Specimen Description BLOOD SITE NOT SPECIFIED  Final   Special Requests   Final    BOTTLES DRAWN AEROBIC AND ANAEROBIC Blood Culture adequate volume   Culture   Final    NO GROWTH 5 DAYS Performed at [facility], [address], [place], [place] [address]    Report Status 11/10/2022 FINAL  Final  Blood Culture (routine x 2)     Status: None   Collection Time: 11/05/22 10:51 AM   Specimen: BLOOD  Result Value Ref Range Status   Specimen Description BLOOD SITE NOT SPECIFIED  Final   Special Requests   Final    BOTTLES DRAWN AEROBIC AND ANAEROBIC Blood Culture adequate volume   Culture   Final    NO GROWTH 5 DAYS Performed at [facility], [address], [place], [place] [address]    Report Status 11/10/2022 FINAL  Final  Resp panel by RT-PCR (RSV, Flu A&B, Covid) Anterior Nasal Swab     Status: None   Collection Time: 11/05/22 10:59 AM   Specimen: Anterior Nasal Swab  Result Value Ref Range Status   SARS Coronavirus 2 by RT PCR NEGATIVE NEGATIVE Final   Influenza A by PCR NEGATIVE NEGATIVE Final   Influenza B by PCR NEGATIVE NEGATIVE Final    Comment: (NOTE) The Xpert Xpress SARS-CoV-2/FLU/RSV plus assay is intended as an aid in the diagnosis of influenza from Nasopharyngeal swab specimens and should not be used as a sole basis for treatment. Nasal washings and aspirates are unacceptable for Xpert Xpress SARS-CoV-2/FLU/RSV testing.  Fact Sheet for Patients: BloggerCourse.com  Fact Sheet for Healthcare Providers: SeriousBroker.it  This test is not yet approved or cleared by the [place] FDA and has been authorized for detection and/or diagnosis of SARS-CoV-2 by FDA  under an Emergency Use Authorization (EUA). This EUA will remain in effect (meaning this test can be used) for the duration of the COVID-19 declaration under Section 564(b)(1) of the Act, 21 U.S.C. section 360bbb-3(b)(1), unless the authorization is terminated or revoked.     Resp Syncytial Virus by PCR NEGATIVE NEGATIVE Final    Comment: (NOTE) Fact Sheet for Patients: BloggerCourse.com  Fact Sheet for Healthcare Providers: SeriousBroker.it  This test is not yet approved or cleared by the [place] FDA and has been authorized for detection and/or diagnosis of SARS-CoV-2 by FDA under an Emergency Use Authorization (EUA). This EUA will remain in effect (meaning this test can be used) for the duration of the COVID-19 declaration under Section 564(b)(1) of the Act, 21 U.S.C. section 360bbb-3(b)(1), unless the authorization is terminated or revoked.  Performed at [facility], [address], [place], [place] [address]   Respiratory (~20 pathogens) panel by PCR     Status: None   Collection Time: 11/05/22  1:17 PM   Specimen: Nasopharyngeal Swab; Respiratory  Result Value Ref Range Status   Adenovirus NOT DETECTED NOT DETECTED Final   Coronavirus 229E NOT DETECTED NOT DETECTED Final    Comment: (NOTE) The Coronavirus on the Respiratory Panel, DOES NOT test for the novel  Coronavirus (2019 nCoV)    Coronavirus HKU1 NOT DETECTED NOT DETECTED Final   Coronavirus NL63 NOT DETECTED NOT DETECTED Final   Coronavirus OC43 NOT DETECTED NOT DETECTED Final   Metapneumovirus NOT DETECTED NOT DETECTED  Final   Rhinovirus / Enterovirus NOT DETECTED NOT DETECTED Final   Influenza A NOT DETECTED NOT DETECTED Final   Influenza B NOT DETECTED NOT DETECTED Final   Parainfluenza Virus 1 NOT DETECTED NOT DETECTED Final   Parainfluenza Virus 2 NOT DETECTED NOT DETECTED Final   Parainfluenza Virus 3 NOT DETECTED NOT DETECTED Final    Parainfluenza Virus 4 NOT DETECTED NOT DETECTED Final   Respiratory Syncytial Virus NOT DETECTED NOT DETECTED Final   Bordetella pertussis NOT DETECTED NOT DETECTED Final   Bordetella Parapertussis NOT DETECTED NOT DETECTED Final   Chlamydophila pneumoniae NOT DETECTED NOT DETECTED Final   Mycoplasma pneumoniae NOT DETECTED NOT DETECTED Final    Comment: Performed at [facility], [address], [place], [place] [address]  MRSA Next Gen by PCR, Nasal     Status: None   Collection Time: 11/05/22  4:13 PM   Specimen: Nasal Mucosa; Nasal Swab  Result Value Ref Range Status   MRSA by PCR Next Gen NOT DETECTED NOT DETECTED Final    Comment: (NOTE) The GeneXpert MRSA Assay (FDA approved for NASAL specimens only), is one component of a comprehensive MRSA colonization surveillance program. It is not intended to diagnose MRSA infection nor to guide or monitor treatment for MRSA infections. Test performance is not FDA approved in patients less than 1 years old. Performed at [facility], [address], [place], [place] [address]      Labs: BNP (last 3 results) Recent Labs    08/19/22 2250 08/25/22 1524 11/05/22 1447  BNP 159.4* 212.4* 724.4*   Basic Metabolic Panel: Recent Labs  Lab 11/05/22 1059 11/05/22 1359 11/06/22 0331 11/09/22 0501  NA 138 138 138 138  K 3.7 4.0 3.9 3.4*  CL 100  --  100 99  CO2 29  --  27 29  GLUCOSE 80  --  122* 129*  BUN 11  --  18 19  CREATININE 1.07*  --  1.12* 0.93  CALCIUM 9.2  --  8.2* 8.2*  MG  --   --   --  1.5*  PHOS  --   --   --  3.1   Liver Function Tests: Recent Labs  Lab 11/05/22 1059  AST 28  ALT 13  ALKPHOS 107  BILITOT 0.5  PROT 6.7  ALBUMIN 3.1*   No results for input(s): "LIPASE", "AMYLASE" in the last 168 hours. No results for input(s): "AMMONIA" in the last 168 hours. CBC: Recent Labs  Lab 11/05/22 1059 11/05/22 1359 11/06/22 0331 11/09/22 0501  WBC 11.9*  --  17.3* 8.4  NEUTROABS 10.9*  --   --    --   HGB 10.9* 9.2* 8.4* 10.8*  HCT 36.1 27.0* 27.3* 34.7*  MCV 103.7*  --  98.6 100.0  PLT 333  --  154 224   Cardiac Enzymes: No results for input(s): "CKTOTAL", "CKMB", "CKMBINDEX", "TROPONINI" in the last 168 hours. BNP: Invalid input(s): "POCBNP" CBG: Recent Labs  Lab 11/10/22 0744 11/10/22 1137 11/10/22 1645 11/10/22 2120 11/11/22 0754  GLUCAP 93 113* 129* 136* 88   D-Dimer No results for input(s): "DDIMER" in the last 72 hours. Hgb A1c No results for input(s): "HGBA1C" in the last 72 hours. Lipid Profile No results for input(s): "CHOL", "HDL", "LDLCALC", "TRIG", "CHOLHDL", "LDLDIRECT" in the last 72 hours. Thyroid function studies No results for input(s): "TSH", "T4TOTAL", "T3FREE", "THYROIDAB" in the last 72 hours.  Invalid input(s): "FREET3" Anemia work up No results for input(s): "  VITAMINB12", "FOLATE", "FERRITIN", "TIBC", "IRON", "RETICCTPCT" in the last 72 hours. Urinalysis    Component Value Date/Time   COLORURINE YELLOW 11/06/2022 0824   APPEARANCEUR HAZY (A) 11/06/2022 0824   LABSPEC 1.024 11/06/2022 0824   PHURINE 5.0 11/06/2022 0824   GLUCOSEU NEGATIVE 11/06/2022 0824   HGBUR NEGATIVE 11/06/2022 0824   BILIRUBINUR NEGATIVE 11/06/2022 0824   KETONESUR 5 (A) 11/06/2022 0824   PROTEINUR 30 (A) 11/06/2022 0824   UROBILINOGEN 0.2 11/23/2014 1558   NITRITE NEGATIVE 11/06/2022 0824   LEUKOCYTESUR TRACE (A) 11/06/2022 0824   Sepsis Labs Recent Labs  Lab 11/05/22 1059 11/06/22 0331 11/09/22 0501  WBC 11.9* 17.3* 8.4   Microbiology Recent Results (from the past 240 hour(s))  Blood Culture (routine x 2)     Status: None   Collection Time: 11/05/22 10:46 AM   Specimen: BLOOD  Result Value Ref Range Status   Specimen Description BLOOD SITE NOT SPECIFIED  Final   Special Requests   Final    BOTTLES DRAWN AEROBIC AND ANAEROBIC Blood Culture adequate volume   Culture   Final    NO GROWTH 5 DAYS Performed at [facility], [address]  [place], [place] [address]    Report Status 11/10/2022 FINAL  Final  Blood Culture (routine x 2)     Status: None   Collection Time: 11/05/22 10:51 AM   Specimen: BLOOD  Result Value Ref Range Status   Specimen Description BLOOD SITE NOT SPECIFIED  Final   Special Requests   Final    BOTTLES DRAWN AEROBIC AND ANAEROBIC Blood Culture adequate volume   Culture   Final    NO GROWTH 5 DAYS Performed at [facility], [address], [place], [place] [address]    Report Status 11/10/2022 FINAL  Final  Resp panel by RT-PCR (RSV, Flu A&B, Covid) Anterior Nasal Swab     Status: None   Collection Time: 11/05/22 10:59 AM   Specimen: Anterior Nasal Swab  Result Value Ref Range Status   SARS Coronavirus 2 by RT PCR NEGATIVE NEGATIVE Final   Influenza A by PCR NEGATIVE NEGATIVE Final   Influenza B by PCR NEGATIVE NEGATIVE Final    Comment: (NOTE) The Xpert Xpress SARS-CoV-2/FLU/RSV plus assay is intended as an aid in the diagnosis of influenza from Nasopharyngeal swab specimens and should not be used as a sole basis for treatment. Nasal washings and aspirates are unacceptable for Xpert Xpress SARS-CoV-2/FLU/RSV testing.  Fact Sheet for Patients: BloggerCourse.com  Fact Sheet for Healthcare Providers: SeriousBroker.it  This test is not yet approved or cleared by the [place] FDA and has been authorized for detection and/or diagnosis of SARS-CoV-2 by FDA under an Emergency Use Authorization (EUA). This EUA will remain in effect (meaning this test can be used) for the duration of the COVID-19 declaration under Section 564(b)(1) of the Act, 21 U.S.C. section 360bbb-3(b)(1), unless the authorization is terminated or revoked.     Resp Syncytial Virus by PCR NEGATIVE NEGATIVE Final    Comment: (NOTE) Fact Sheet for Patients: BloggerCourse.com  Fact Sheet for Healthcare  Providers: SeriousBroker.it  This test is not yet approved or cleared by the [place] FDA and has been authorized for detection and/or diagnosis of SARS-CoV-2 by FDA under an Emergency Use Authorization (EUA). This EUA will remain in effect (meaning this test can be used) for the duration of the COVID-19 declaration under Section 564(b)(1) of the Act, 21 U.S.C. section 360bbb-3(b)(1), unless the authorization is terminated  or revoked.  Performed at [facility], [address] [place], [place] [address]   Respiratory (~20 pathogens) panel by PCR     Status: None   Collection Time: 11/05/22  1:17 PM   Specimen: Nasopharyngeal Swab; Respiratory  Result Value Ref Range Status   Adenovirus NOT DETECTED NOT DETECTED Final   Coronavirus 229E NOT DETECTED NOT DETECTED Final    Comment: (NOTE) The Coronavirus on the Respiratory Panel, DOES NOT test for the novel  Coronavirus (2019 nCoV)    Coronavirus HKU1 NOT DETECTED NOT DETECTED Final   Coronavirus NL63 NOT DETECTED NOT DETECTED Final   Coronavirus OC43 NOT DETECTED NOT DETECTED Final   Metapneumovirus NOT DETECTED NOT DETECTED Final   Rhinovirus / Enterovirus NOT DETECTED NOT DETECTED Final   Influenza A NOT DETECTED NOT DETECTED Final   Influenza B NOT DETECTED NOT DETECTED Final   Parainfluenza Virus 1 NOT DETECTED NOT DETECTED Final   Parainfluenza Virus 2 NOT DETECTED NOT DETECTED Final   Parainfluenza Virus 3 NOT DETECTED NOT DETECTED Final   Parainfluenza Virus 4 NOT DETECTED NOT DETECTED Final   Respiratory Syncytial Virus NOT DETECTED NOT DETECTED Final   Bordetella pertussis NOT DETECTED NOT DETECTED Final   Bordetella Parapertussis NOT DETECTED NOT DETECTED Final   Chlamydophila pneumoniae NOT DETECTED NOT DETECTED Final   Mycoplasma pneumoniae NOT DETECTED NOT DETECTED Final    Comment: Performed at [facility], [address] [place], [place] [address]  MRSA Next Gen by  PCR, Nasal     Status: None   Collection Time: 11/05/22  4:13 PM   Specimen: Nasal Mucosa; Nasal Swab  Result Value Ref Range Status   MRSA by PCR Next Gen NOT DETECTED NOT DETECTED Final    Comment: (NOTE) The GeneXpert MRSA Assay (FDA approved for NASAL specimens only), is one component of a comprehensive MRSA colonization surveillance program. It is not intended to diagnose MRSA infection nor to guide or monitor treatment for MRSA infections. Test performance is not FDA approved in patients less than 83 years old. Performed at [facility], [address], [place], [place] [address]      Time coordinating discharge: Over 30 minutes  SIGNED:   Huey Bienenstock, MD  [facility] 11/11/2022, 9:23 AM Pager   If 7PM-7AM, please contact night-coverage [email or web address] Password [email or web address]

## 2023-01-09 ENCOUNTER — Telehealth: Payer: Self-pay | Admitting: Oncology

## 2023-01-09 NOTE — Telephone Encounter (Signed)
CT Chest has been scheduled for 01/22/23 @ 11; Check in @ 10:30 am   LVM notifying Debbie in [place] of date,time and instructions.

## 2023-01-13 ENCOUNTER — Ambulatory Visit: Payer: Medicare PPO | Admitting: Pulmonary Disease

## 2023-01-16 IMAGING — DX DG ABDOMEN 1V
3 series · 3 of 3 positions shown · non-contrast
Comparison: 07/28/2020 CT

CLINICAL DATA: Nausea and vomiting

EXAM:
ABDOMEN - 1 VIEW

[abdomen kub (1 of 3)]
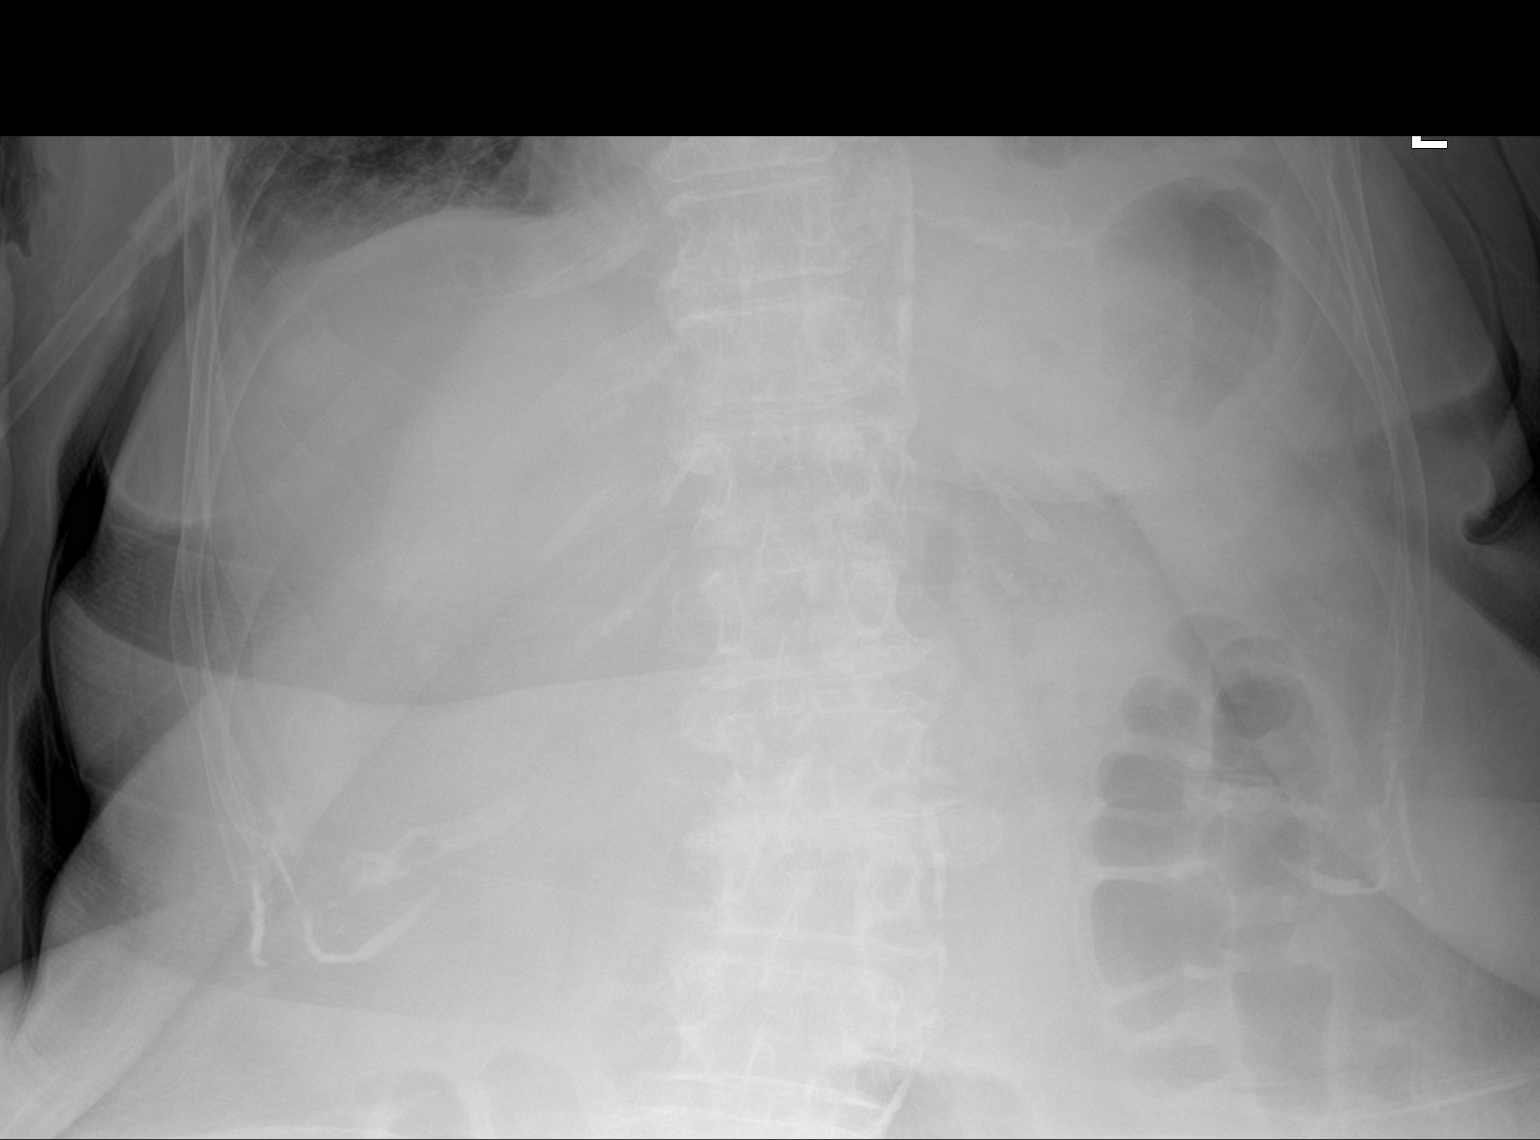

[abdomen kub (2 of 3)]
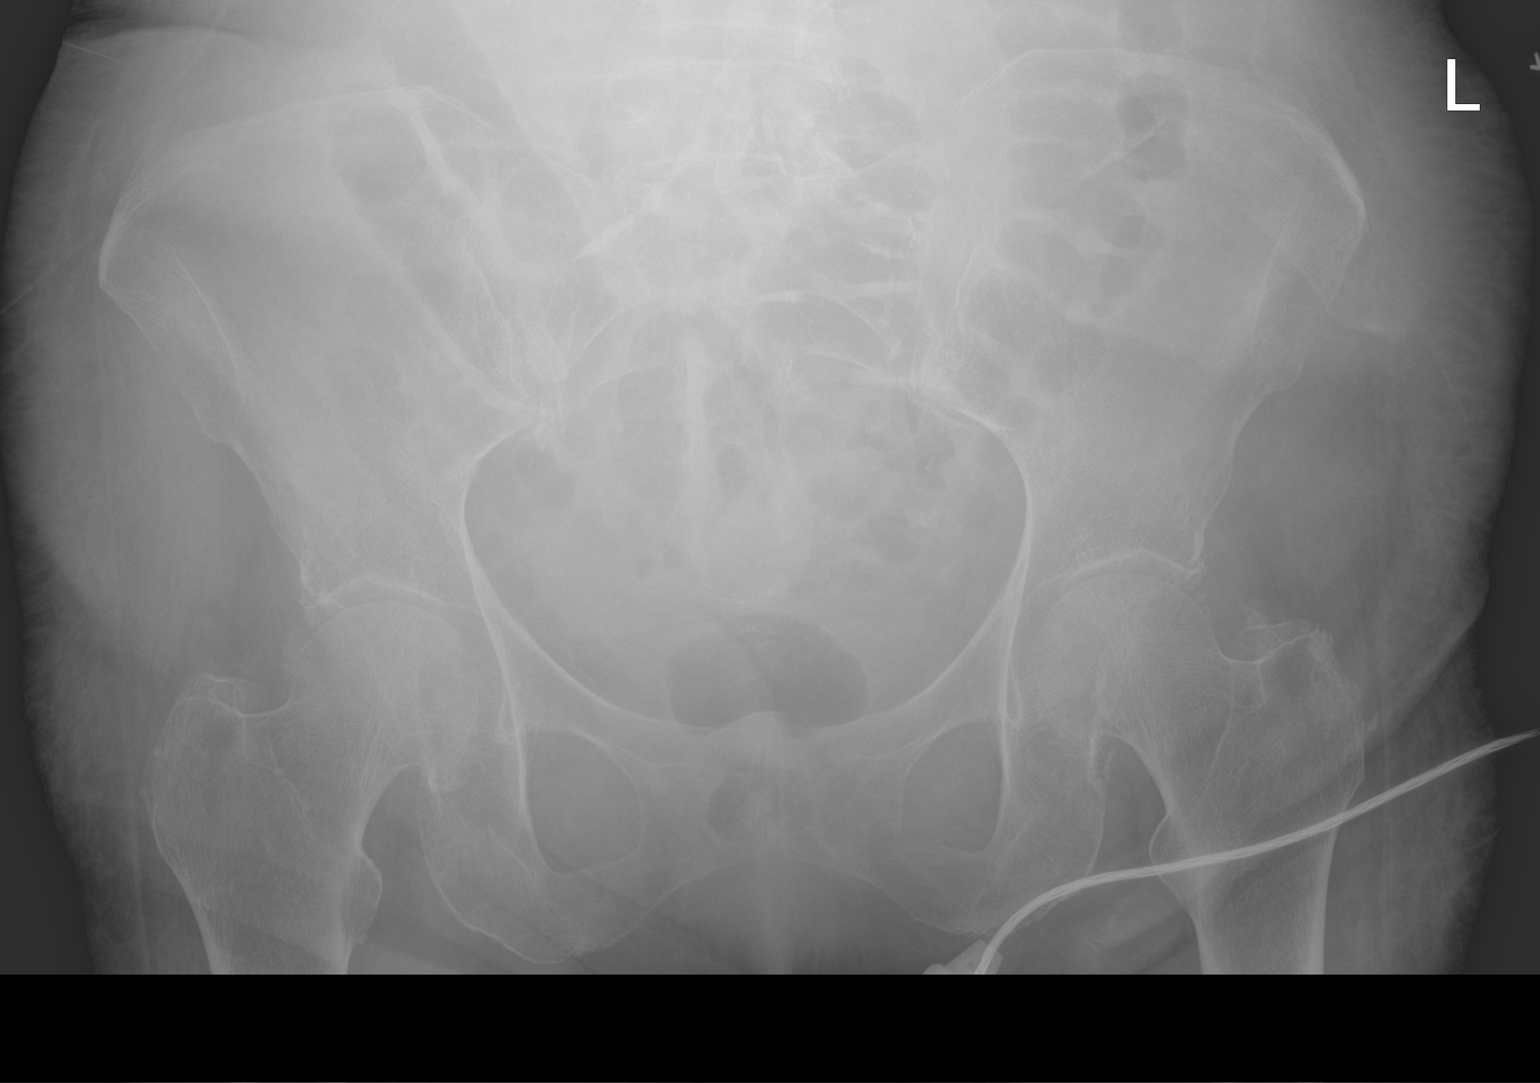

[abdomen kub (3 of 3)]
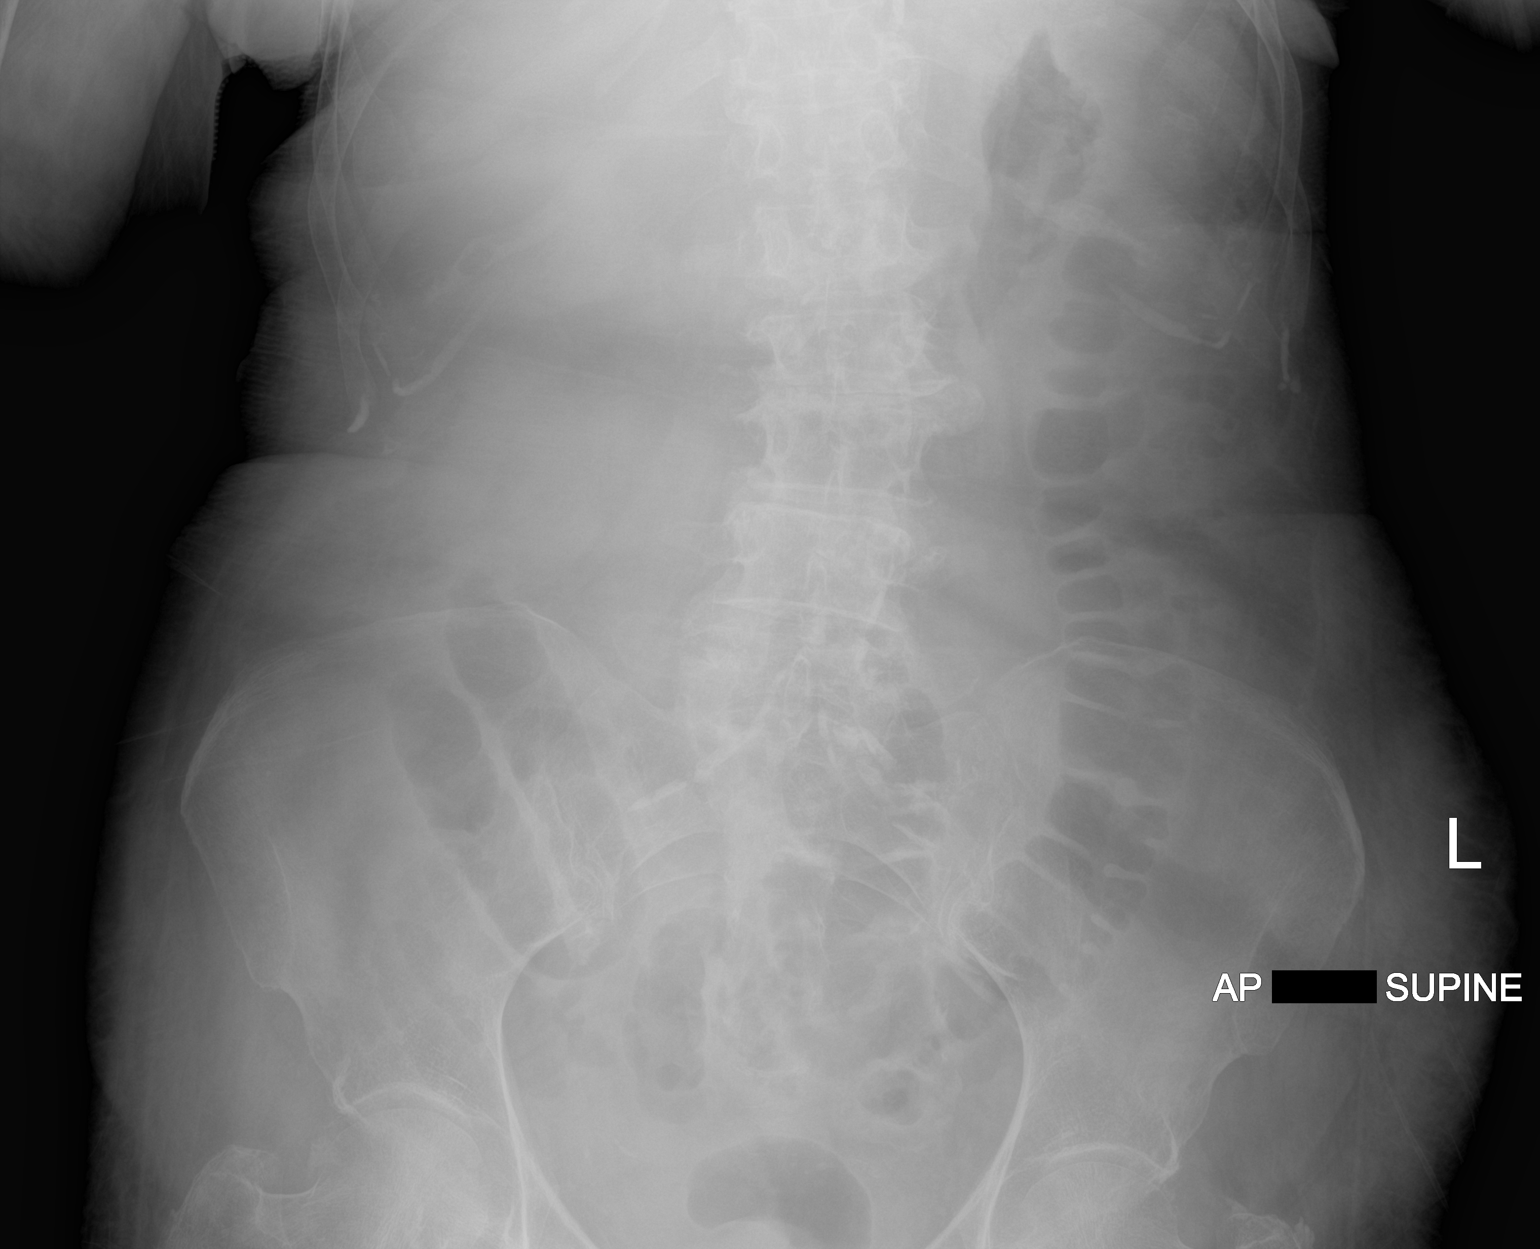

[3 of 3 positions shown; findings below may reference images not displayed]

FINDINGS: Scattered large and small bowel gas is noted. No obstructive changes
or free air is identified. No abnormal mass or abnormal
calcifications are seen. Bony structures demonstrate degenerative
change of the lumbar spine.
IMPRESSION: No acute abnormality noted.

## 2023-01-22 ENCOUNTER — Ambulatory Visit: Payer: Medicare PPO | Admitting: Oncology

## 2023-01-22 LAB — LAB REPORT - SCANNED: EGFR: 65

## 2023-01-22 NOTE — Progress Notes (Signed)
[facility]  [address] [place],  [place]  [address] [phone]  Clinic Day:  01/23/2023  Referring physician: No ref. provider found  HISTORY OF PRESENT ILLNESS:  The patient is a 77 y.o. 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  This patient also had an enlarging right lower lobe lung lesion that was highly suspicious for another lung cancer.  She underwent stereotactic radiation in May 2024 to address both lesions.  She comes in today to go over her chest CT images to ensure there remains no evidence of disease recurrence.  Since her last visit, the patient has been doing fairly well.  She has chronic shortness of breath for which she is on oxygen.  However, she denies having hemoptysis or other new respiratory symptoms which concern her for disease recurrence.  With respect to her cancer history, the patient was diagnosed with stage IIIA (T2 N1 M0) colon cancer, status post a right hemicolectomy in June 2022.  Due to her poor baseline health, adjuvant chemotherapy was not entertained.  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  The patient completed stereotactic radiation for her lung cancer in October 2022.  She has chronic shortness of breath for which she uses 3 L of oxygen.   PHYSICAL EXAM:  Blood pressure 123/63, pulse 89, temperature 98.9 F (37.2 C), temperature source Oral, resp. rate 14, height 5' (1.524 m), SpO2 97%. Wt Readings from Last 3 Encounters:  11/07/22 105 lb 2.6 oz (47.7 kg)  10/09/22 103 lb 3.2 oz (46.8 kg)  08/25/22 100 lb 12 oz (45.7 kg)   Body mass index is 20.54 kg/m. Performance status (ECOG): 3 - Symptomatic, >50% confined to bed Physical Exam Constitutional:      Appearance: Normal appearance. She is ill-appearing.     Comments: Chronically-ill appearing woman who is in a  wheelchair, wearing oxygen per nasal canula.  She looks weaker versus previous visits  HENT:     Mouth/Throat:     Mouth: Mucous membranes are moist.     Pharynx: Oropharynx is clear. No oropharyngeal exudate or posterior oropharyngeal erythema.  Cardiovascular:     Rate and Rhythm: Normal rate and regular rhythm.     Heart sounds: No murmur heard.    No friction rub. No gallop.  Pulmonary:     Effort: Pulmonary effort is normal. No respiratory distress.     Breath sounds: Normal breath sounds. No wheezing, rhonchi or rales.     Comments: Diffusely decreased breath sounds bilaterally Abdominal:     General: Bowel sounds are normal. There is no distension.     Palpations: Abdomen is soft. There is no mass.     Tenderness: There is no abdominal tenderness.  Musculoskeletal:        General: No swelling.     Right lower leg: No edema.     Left lower leg: No edema.  Lymphadenopathy:     Cervical: No cervical adenopathy.     Upper Body:     Right upper body: No supraclavicular or axillary adenopathy.     Left upper body: No supraclavicular or axillary adenopathy.     Lower Body: No right inguinal adenopathy. No left inguinal adenopathy.  Skin:    General: Skin is warm.     Coloration: Skin is not jaundiced.  Findings: No lesion or rash.  Neurological:     General: No focal deficit present.     Mental Status: She is alert and oriented to person, place, and time. Mental status is at baseline.  Psychiatric:        Mood and Affect: Mood normal.        Behavior: Behavior normal.        Thought Content: Thought content normal.    SCANS:  A chest CT done yesterday showed the following:  FINDINGS: Cardiovascular: Borderline cardiomegaly. No pericardial effusion. Mild coronary artery calcification. No central pulmonary embolus.  Aorta: Moderate/severe aortic plaque without aneurysm or dissection.  Mediastinum: There is no mediastinal, hilar, or axillary lymphadenopathy.  Lungs:  Mild pulmonary emphysema diffuse micro nodularity with tree-in-bud configuration indicating bronchiolitis. Superimposed patchy pneumonia in both lower lobes. Small left pleural effusion.  Pleura: Small left pleural effusion. No pneumothorax.  Chest wall: No masses identified.  Upper abdomen: A few scattered hepatic cysts measuring 6 mm or less.  Osseous: Bones are demineralized. No vertebral fractures. Extensive degenerative changes of the lower cervical and upper thoracic spine. Remote appearing right rib fractures. Right glenohumeral dislocation, chronic appearing with large right glenohumeral joint effusion and some capsular calcifications. Abnormal remodeled appearance of the right glenoid. No osseous metastatic disease is seen.  IMPRESSION: Diffuse bronchiolitis with patchy bibasilar pneumonia. No discrete mass is identified. A follow-up CT chest upon resolution of this acute process is recommended as any underlying pulmonary nodules related to patient's history of lung cancer could be obscured. 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  ASSESSMENT & PLAN:  A 77 y.o. 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  She also has a history of stage IIIA (T2 N1 M0) colon cancer, status post a right hemicolectomy in June 2022.  The patient also had a right lower lobe lung lesion and left lingula lesion that were hypermetabolic in nature which recently underwent stereotactic radiation in May 2024.  In clinic today, I went over her chest CT images with her, for which chronic lung changes could be seen.  However, there remained no obvious evidence of recurrent lung cancer being present.  Understandably, the patient was pleased with her images.  I will repeat another chest CT in 4 months for her continued lung cancer surveillance, and will review the images with her the following day.  The  patient understands all the plans discussed today and is in agreement with them.  Laverle Pillard Kirby Funk, MD

## 2023-01-23 ENCOUNTER — Other Ambulatory Visit: Payer: Self-pay | Admitting: Oncology

## 2023-01-23 ENCOUNTER — Inpatient Hospital Stay: Payer: Medicare PPO | Attending: Oncology | Admitting: Oncology

## 2023-01-23 VITALS — BP 123/63 | HR 89 | Temp 98.9°F | Resp 14 | Ht 60.0 in

## 2023-01-23 DIAGNOSIS — C3492 Malignant neoplasm of unspecified part of left bronchus or lung: Secondary | ICD-10-CM

## 2023-01-23 DIAGNOSIS — Z923 Personal history of irradiation: Secondary | ICD-10-CM | POA: Diagnosis not present

## 2023-01-23 DIAGNOSIS — Z85038 Personal history of other malignant neoplasm of large intestine: Secondary | ICD-10-CM | POA: Insufficient documentation

## 2023-01-23 DIAGNOSIS — C3432 Malignant neoplasm of lower lobe, left bronchus or lung: Secondary | ICD-10-CM

## 2023-01-23 DIAGNOSIS — Z85118 Personal history of other malignant neoplasm of bronchus and lung: Secondary | ICD-10-CM | POA: Diagnosis present

## 2023-01-24 ENCOUNTER — Other Ambulatory Visit: Payer: Self-pay | Admitting: Oncology

## 2023-01-24 DIAGNOSIS — C3432 Malignant neoplasm of lower lobe, left bronchus or lung: Secondary | ICD-10-CM

## 2023-02-06 ENCOUNTER — Encounter: Payer: Self-pay | Admitting: Oncology

## 2023-02-09 ENCOUNTER — Encounter: Payer: Self-pay | Admitting: Oncology

## 2023-02-19 ENCOUNTER — Other Ambulatory Visit: Payer: Self-pay

## 2023-05-15 ENCOUNTER — Other Ambulatory Visit: Payer: Self-pay

## 2023-05-20 ENCOUNTER — Encounter: Payer: Self-pay | Admitting: Cardiology

## 2023-05-20 ENCOUNTER — Ambulatory Visit: Attending: Cardiology | Admitting: Cardiology

## 2023-05-20 VITALS — BP 112/58 | HR 80 | Ht 60.0 in | Wt 109.8 lb

## 2023-05-20 DIAGNOSIS — J431 Panlobular emphysema: Secondary | ICD-10-CM

## 2023-05-20 DIAGNOSIS — I480 Paroxysmal atrial fibrillation: Secondary | ICD-10-CM | POA: Diagnosis not present

## 2023-05-20 NOTE — Progress Notes (Signed)
 Cardiology Office Note:    Date:  05/20/2023   ID:  Desiree [name], DOB July 12, 1945, MRN [id number]  PCP:  Patient, No Pcp Per  Cardiologist:  Nelia Balzarine, MD   Referring MD: No ref. provider found    ASSESSMENT:    1. Paroxysmal atrial fibrillation (HCC)   2. Panlobular emphysema (HCC)    PLAN:    In order of problems listed above:  Coronary artery disease: Secondary prevention stressed with the patient.  Importance of compliance with diet medication stressed and patient verbalized standing. Paroxysmal atrial fibrillation: Rhythm is well-controlled.  Not on anticoagulation because of history of colon cancer and also unstable gait.  She is essentially in a wheelchair and whenever she tries to ambulate she says she is not steady.  Risks of anticoagulation explained and she is not keen on it. History of COPD: Stable and managed by primary Mixed dyslipidemia: On lipid-lowering medications followed by primary care.  Goal LDL less than 60. Essential hypertension: Stable blood pressure and diet emphasized. Patient will be seen in follow-up appointment in 6 months or earlier if the patient has any concerns.    Medication Adjustments/Labs and Tests Ordered: Current medicines are reviewed at length with the patient today.  Concerns regarding medicines are outlined above.  No orders of the defined types were placed in this encounter.  No orders of the defined types were placed in this encounter.    No chief complaint on file.    History of Present Illness:    Desiree [name] is a 78 y.o. female.  Patient has past medical history of paroxysmal atrial fibrillation, atherosclerotic coronary artery disease, essential hypertension and this time she is in a wheelchair brought in by her nursing home attendant.  She denies any chest pain orthopnea or PND.  At the time of my evaluation, the patient is alert awake oriented and in no distress.  No history of palpitations.  Past Medical  History:  Diagnosis Date   Abnormal nuclear stress test    AF (paroxysmal atrial fibrillation) (HCC)    Age-related osteoporosis without current pathological fracture 05/16/2016   Alcoholism (HCC) 10/21/2018   Formatting of this note might be different from the original. 2 glasses a day.   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 Wrist surgery   Benign paroxysmal positional vertigo due to bilateral vestibular disorder 02/09/2020   Cancer of ascending colon (HCC)    CAP (community acquired pneumonia) 08/20/2022   Carcinoma of ascending colon (HCC)    Cat allergies    Centrilobular emphysema (HCC) 05/16/2016   Chronic anemia 04/09/2021   Chronic atrial fibrillation, unspecified (HCC) 10/04/2020   Chronic obstructive pulmonary disease (HCC) 04/09/2021   Chronic pain 04/09/2021   Chronic respiratory failure with hypoxia (HCC) 08/25/2022   CKD (chronic kidney disease) stage 3, GFR 30-59 ml/min (HCC) 01/04/2018   COPD (chronic obstructive pulmonary disease) (HCC)    Cysts of both ovaries 11/24/2019   Formatting of this note might be different from the original. On CT 11/2019. Referred to GYN.   Dyspnea    Early satiety 04/14/2019   Essential hypertension 08/22/2010   Formatting of this note might be different from the original. Formatting of this note might be different from the original. D/C ACE 08/22/2010 due to concerns with freq aecopd/ pseudoasthma > resolved  Last  Assessment & Plan:  Formatting of this note might be different from the original. Adequate control on present rx, reviewed - note she's no longer falling apart during her aecopd's which are less   Gastro-esophageal reflux disease without esophagitis 07/14/2017   Gastrointestinal hemorrhage    History of pulmonary embolus (PE) 07/14/2017   History of radiation  therapy    Left lung- SBRT 11/13/20-11/27/20- Dr. Antony Blackbird   History of radiation therapy    Right and Left Lung 06/16/22-06/27/22- Dr. 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   Lung cancer, lower lobe (HCC) 09/24/2020   Lung nodule, solitary 08/16/2020   Added automatically from request for surgery [id number]   [facility] and fatigue 07/14/2017   Malignant neoplasm of unspecified part of left bronchus or lung (HCC) 04/09/2021   Malignant tumor of ascending colon (HCC) 04/09/2021   Malnutrition of moderate degree 08/26/2022   Mild CAD 02/02/2019   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 Possible???? See CT comments 11/2019.   Other vitamin B12 deficiency anemias 04/09/2021   Paroxysmal atrial fibrillation (HCC) 04/14/2019   Formatting of this note might be different from the original. [facility].   Prediabetes 07/14/2017   Pulmonary embolism (HCC) 2017   Rheumatoid arthritis (HCC) 09/20/2010   Dr Nickola Major in [place].   Schatzki's ring    Senile purpura (HCC) 01/04/2018   Septic shock (HCC) 11/05/2022   Skin rash 07/14/2017   Bx'd Allergic. Likely meds.   Status post thoracentesis    Symptomatic anemia 07/25/2020   Type 2 diabetes mellitus without complications (HCC) 04/09/2021   UTI (urinary tract infection) 06/15/2022   Vitamin B12 deficiency 07/14/2017   Vitamin D deficiency 04/09/2021    Past Surgical History:  Procedure Laterality Date   BIOPSY  07/27/2020   Procedure: BIOPSY;  Surgeon: Iva Boop, MD;  Location: [facility]  ENDOSCOPY;  Service: Endoscopy;;   BRONCHIAL BIOPSY  09/11/2020   Procedure: BRONCHIAL BIOPSIES;  Surgeon: Josephine Igo, DO;  Location: [facility] ENDOSCOPY;  Service: Pulmonary;;   BRONCHIAL BRUSHINGS  09/11/2020   Procedure: BRONCHIAL BRUSHINGS;  Surgeon: Josephine Igo, DO;  Location: [facility] ENDOSCOPY;  Service: Pulmonary;;   BRONCHIAL NEEDLE ASPIRATION BIOPSY  09/11/2020   Procedure: BRONCHIAL NEEDLE ASPIRATION BIOPSIES;  Surgeon: Josephine Igo, DO;  Location: [facility] ENDOSCOPY;  Service: Pulmonary;;   BRONCHIAL WASHINGS  09/11/2020   Procedure: BRONCHIAL WASHINGS;  Surgeon: Josephine Igo, DO;  Location: [facility] ENDOSCOPY;  Service: Pulmonary;;   COLONOSCOPY N/A 07/27/2020   Procedure: COLONOSCOPY;  Surgeon: Iva Boop, MD;  Location[facility] ENDOSCOPY;  Service: Endoscopy;  Laterality: N/A;   CYSTECTOMY Right    breast   ESOPHAGOGASTRODUODENOSCOPY (EGD) WITH PROPOFOL N/A 07/27/2020   Procedure: ESOPHAGOGASTRODUODENOSCOPY (EGD) WITH PROPOFOL;  Surgeon: Iva Boop, MD;  Location: [facility] ENDOSCOPY;  Service: Endoscopy;  Laterality: N/A;   FIDUCIAL MARKER PLACEMENT  09/11/2020   Procedure: FIDUCIAL MARKER PLACEMENT;  Surgeon: Josephine Igo, DO;  Location: [facility] ENDOSCOPY;  Service: Pulmonary;;   LAPAROSCOPIC RIGHT HEMI COLECTOMY Right 07/31/2020   Procedure:  LAPAROSCOPIC ASSISSTED  RIGHT HEMI COLECTOMY;  Surgeon: Dareen Ebbing, MD;  Location: [facility] OR;  Service: General;  Laterality: Right;   LEFT HEART CATH AND CORONARY ANGIOGRAPHY N/A 01/05/2019   Procedure: LEFT HEART CATH AND CORONARY ANGIOGRAPHY;  Surgeon: Lucendia Rusk, MD;  Location: [facility] INVASIVE CV LAB;  Service: Cardiovascular;  Laterality: N/A;   SUBMUCOSAL TATTOO INJECTION  07/27/2020   Procedure: SUBMUCOSAL TATTOO INJECTION;  Surgeon: Kenney Peacemaker, MD;  Location: [facility] ENDOSCOPY;  Service: Endoscopy;;   VIDEO BRONCHOSCOPY WITH ENDOBRONCHIAL NAVIGATION Left 09/11/2020   Procedure: VIDEO BRONCHOSCOPY WITH ENDOBRONCHIAL NAVIGATION;  Surgeon: Prudy Brownie, DO;  Location: [facility] ENDOSCOPY;  Service: Pulmonary;  Laterality: Left;  ION   VIDEO BRONCHOSCOPY WITH RADIAL ENDOBRONCHIAL ULTRASOUND  09/11/2020   Procedure: RADIAL ENDOBRONCHIAL ULTRASOUND;  Surgeon: Prudy Brownie, DO;  Location: [facility] ENDOSCOPY;  Service: Pulmonary;;    Current Medications: Current Meds  Medication Sig   alendronate (FOSAMAX) 70 MG tablet Take 70 mg by mouth once a week.   amLODipine (NORVASC) 5 MG tablet Take 1 tablet (5 mg total) by mouth daily. Patient needs appointment for further refills. 1 st attempt   aspirin EC 81 MG tablet Take 81 mg by mouth daily.   atorvastatin (LIPITOR) 20 MG tablet Take 1 tablet (20 mg total) by mouth daily. Patient needs to keep appointment for further refills. 1 st attempt   Budeson-Glycopyrrol-Formoterol (BREZTRI AEROSPHERE) 160-9-4.8 MCG/ACT AERO Inhale 2 puffs into the lungs 2 (two) times daily.   carvedilol (COREG) 6.25 MG tablet Take 6.25 mg by mouth 2 (two) times daily with a meal.   cholecalciferol (VITAMIN D3) 25 MCG (1000 UNIT) tablet Take 1,000 Units by mouth in the morning and at bedtime.   ferrous sulfate 325 (65 FE) MG tablet Take 325 mg by mouth daily with breakfast.   fexofenadine (ALLEGRA) 180 MG tablet Take 180 mg by mouth daily.   furosemide (LASIX) 20 MG tablet Take 20 mg by mouth daily.   gabapentin (NEURONTIN) 100 MG capsule Take 100 mg by mouth 2 (two) times daily.   guaiFENesin (ROBITUSSIN) 100 MG/5ML liquid Take 5 mLs by mouth every 4 (four) hours as needed for cough or to loosen phlegm.   hydroxychloroquine (PLAQUENIL) 200 MG tablet Take 200 mg by mouth daily.   ipratropium-albuterol (DUONEB) 0.5-2.5 (3) MG/3ML SOLN Take 3 mLs by nebulization.   leflunomide (ARAVA) 10 MG tablet Take 10 mg by mouth daily.   mirtazapine (REMERON) 7.5 MG tablet Take 7.5 mg by mouth at bedtime.   montelukast (SINGULAIR) 10 MG tablet Take 1 tablet (10 mg total) by mouth at bedtime.   omeprazole (PRILOSEC) 20 MG capsule Take 1  capsule (20 mg total) by mouth daily.   OYSTERCAL-D 500-10 MG-MCG TABS Take 1 tablet by mouth daily.   predniSONE (DELTASONE) 5 MG tablet Take 1 tablet (5 mg total) by mouth daily with breakfast.   rOPINIRole (REQUIP) 0.5 MG tablet Take 0.5 mg by mouth at bedtime.   senna-docusate (SENOKOT-S) 8.6-50 MG tablet Take 1 tablet by mouth at bedtime.   Sertraline HCl 150 MG CAPS Take 1 capsule by mouth daily.   vitamin B-12 (CYANOCOBALAMIN) 1000 MCG tablet Take 1,000 mcg by mouth daily.      Allergies:   Other and Oxycodone   Social History   Socioeconomic History   Marital status: Widowed    Spouse name: Not on file   Number of children: Not on file   Years of education: Not on file  Highest education level: Not on file  Occupational History   Occupation: retired  Tobacco Use   Smoking status: Former    Current packs/day: 0.00    Average packs/day: 1 pack/day for 40.0 years (40.0 ttl pk-yrs)    Types: Cigarettes    Start date: 02/03/1961    Quit date: 02/03/2001    Years since quitting: 22.3   Smokeless tobacco: Never  Vaping Use   Vaping status: Never Used  Substance and Sexual Activity   Alcohol use: Yes    Alcohol/week: 2.0 standard drinks of alcohol    Types: 2 Standard drinks or equivalent per week    Comment: 2 drinks daily   Drug use: No   Sexual activity: Not on file  Other Topics Concern   Not on file  Social History Narrative   Tobacco use, amount per day now: NONE   Past tobacco use, amount per day: 1 PACK PER DAY   How many years did you use tobacco:TOO MANY STOPPED IN 2003   Alcohol use (drinks per week): 14   Diet: GOOD   Do you drink/eat things with caffeine: YES   Marital status: WIDOW                What year were you married? 1967   Do you live in a house, apartment, assisted living, condo, trailer, etc.? HOUSE   Is it one or more stories? SPLIT LEVEL   How many persons live in your home? 1   Do you have pets in your home?( please list) NONE   Current or  past profession: PAST RECORDS TECHNICIAN AT [employer]   Do you exercise?  NO                                Type and how often?   Do you have a living will? YES    Do you have a DNR form?  YES                                 If not, do you want to discuss one?   Do you have signed POA/HPOA forms?        ??              If so, please bring to you appointment      [facility] (05/16/16):   Originally from [place], [place]. 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 No pets currently. No bird, mold, or hot tub exposure.    Social Drivers of Corporate investment banker Strain: Low Risk  (10/26/2020)   Overall Financial Resource Strain (CARDIA)    Difficulty of Paying Living Expenses: Not very hard  Food Insecurity: Low Risk  (04/18/2023)   Received from [facility]   Hunger Vital Sign    Worried About Running Out of Food in the Last Year: Never true    Ran Out of Food in the Last Year: Never true  Transportation Needs: No Transportation Needs (04/18/2023)   Received from [facility]    In the past 12 months, has lack of reliable transportation kept you from medical appointments, meetings, work or from getting things needed for daily living? : No  Physical Activity: Not on file  Stress: Not on file  Social Connections: Socially Isolated (10/26/2020)  Social Connection and Isolation Panel [NHANES]    Frequency of Communication with Friends and Family: Twice a week    Frequency of Social Gatherings with Friends and Family: Never    Attends Religious Services: Never    Database administrator or Organizations: No    Attends Banker Meetings: Never    Marital Status: Widowed     Family History: The patient's family history includes Breast cancer in her maternal aunt; Clotting disorder in her mother; Emphysema in her paternal grandmother and sister; Heart disease in her father and sister; Sleep apnea in her sister;  Stroke in her mother.  ROS:   Please see the history of present illness.    All other systems reviewed and are negative.  EKGs/Labs/Other Studies Reviewed:    The following studies were reviewed today: I discussed my findings with the patient at length   Recent Labs: 11/05/2022: ALT 13; B Natriuretic Peptide 724.4 11/09/2022: [name] 19; Creatinine, Ser 0.93; Hemoglobin 10.8; Magnesium 1.5; Platelets 224; Potassium 3.4; Sodium 138  Recent Lipid Panel No results found for: "CHOL", "TRIG", "HDL", "CHOLHDL", "VLDL", "LDLCALC", "LDLDIRECT"  Physical Exam:    VS:  BP (!) 112/58   Pulse 80   Ht 5' (1.524 m)   Wt 109 lb 12.8 oz (49.8 kg)   SpO2 95%   BMI 21.44 kg/m     Wt Readings from Last 3 Encounters:  05/20/23 109 lb 12.8 oz (49.8 kg)  11/07/22 105 lb 2.6 oz (47.7 kg)  10/09/22 103 lb 3.2 oz (46.8 kg)     GEN: Patient is in no acute distress HEENT: Normal NECK: No JVD; No carotid bruits LYMPHATICS: No lymphadenopathy CARDIAC: Hear sounds regular, 2/6 systolic murmur at the apex. RESPIRATORY:  Clear to auscultation without rales, wheezing or rhonchi  ABDOMEN: Soft, non-tender, non-distended MUSCULOSKELETAL:  No edema; No deformity  SKIN: Warm and dry NEUROLOGIC:  Alert and oriented x 3 PSYCHIATRIC:  Normal affect   Signed, Nelia Balzarine, MD  05/20/2023 11:48 AM    [facility]

## 2023-05-20 NOTE — Patient Instructions (Signed)
 Medication Instructions:  Your physician recommends that you continue on your current medications as directed. Please refer to the Current Medication list given to you today.  *If you need a refill on your cardiac medications before your next appointment, please call your pharmacy*   Lab Work: None Ordered If you have labs (blood work) drawn today and your tests are completely normal, you will receive your results only by: MyChart Message (if you have MyChart) OR A paper copy in the mail If you have any lab test that is abnormal or we need to change your treatment, we will call you to review the results.   Testing/Procedures: None Ordered   Follow-Up: At [facility], you and your health needs are our priority.  As part of our continuing mission to provide you with exceptional heart care, we have created designated Provider Care Teams.  These Care Teams include your primary Cardiologist (physician) and Advanced Practice Providers (APPs -  Physician Assistants and Nurse Practitioners) who all work together to provide you with the care you need, when you need it.  We recommend signing up for the patient portal called "MyChart".  Sign up information is provided on this After Visit Summary.  MyChart is used to connect with patients for Virtual Visits (Telemedicine).  Patients are able to view lab/test results, encounter notes, upcoming appointments, etc.  Non-urgent messages can be sent to your provider as well.   To learn more about what you can do with MyChart, go to [email or web address].    Your next appointment:   12 month follow up  NA

## 2023-05-21 ENCOUNTER — Ambulatory Visit (HOSPITAL_BASED_OUTPATIENT_CLINIC_OR_DEPARTMENT_OTHER)
Admission: RE | Admit: 2023-05-21 | Discharge: 2023-05-21 | Disposition: A | Discharge status: Home / Self Care | Source: Ambulatory Visit | Attending: Oncology | Admitting: Oncology

## 2023-05-21 DIAGNOSIS — C3432 Malignant neoplasm of lower lobe, left bronchus or lung: Secondary | ICD-10-CM | POA: Diagnosis not present

## 2023-05-21 MED ORDER — IOHEXOL 300 MG/ML  SOLN
100.0000 mL | Freq: Once | INTRAMUSCULAR | Status: AC | PRN
  Administered 2023-05-21: 80 mL via INTRAVENOUS

## 2023-05-24 NOTE — Progress Notes (Signed)
 [facility]  [address] [place],  [place]  [address] [phone]  Clinic Day:  01/23/2023  Referring physician: No ref. provider found  HISTORY OF PRESENT ILLNESS:  The patient is a 78 y.o. 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  This patient also had an enlarging right lower lobe lung lesion that was highly suspicious for another lung cancer.  She underwent stereotactic radiation in May 2024 to address both lesions.  She comes in today to go over her chest CT images to ensure there remains no evidence of disease recurrence.  Since her last visit, the patient has been doing fairly well.  She has chronic shortness of breath for which she is on oxygen .  She also has been getting intermittent pneumonia.  However, she denies having other symptoms or findings which concern her for recurrent lung cancer being present.  With respect to her cancer history, the patient was diagnosed with stage IIIA (T2 N1 M0) colon cancer, status post a right hemicolectomy in June 2022.  Due to her poor baseline health, adjuvant chemotherapy was not entertained.  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  The patient completed stereotactic radiation for her lung cancer in October 2022.  She has chronic shortness of breath for which she uses 3 L of oxygen .   PHYSICAL EXAM:  Blood pressure (!) 148/55, pulse 87, temperature 98.7 F (37.1 C), temperature source Oral, resp. rate 16, height 5' (1.524 m), SpO2 90%. Wt Readings from Last 3 Encounters:  05/20/23 109 lb 12.8 oz (49.8 kg)  11/07/22 105 lb 2.6 oz (47.7 kg)  10/09/22 103 lb 3.2 oz (46.8 kg)   Body mass index is 21.44 kg/m. Performance status (ECOG): 3 - Symptomatic, >50% confined to bed Physical Exam Constitutional:      Appearance: Normal appearance. She is ill-appearing.      Comments: Chronically-ill appearing woman who is in a wheelchair, wearing oxygen  per nasal canula.    HENT:     Mouth/Throat:     Mouth: Mucous membranes are moist.     Pharynx: Oropharynx is clear. No oropharyngeal exudate or posterior oropharyngeal erythema.  Cardiovascular:     Rate and Rhythm: Normal rate and regular rhythm.     Heart sounds: No murmur heard.    No friction rub. No gallop.  Pulmonary:     Effort: Pulmonary effort is normal. No respiratory distress.     Breath sounds: Normal breath sounds. No wheezing, rhonchi or rales.     Comments: Diffusely decreased breath sounds bilaterally Abdominal:     General: Bowel sounds are normal. There is no distension.     Palpations: Abdomen is soft. There is no mass.     Tenderness: There is no abdominal tenderness.  Musculoskeletal:        General: No swelling.     Right lower leg: No edema.     Left lower leg: No edema.  Lymphadenopathy:     Cervical: No cervical adenopathy.     Upper Body:     Right upper body: No supraclavicular or axillary adenopathy.     Left upper body: No supraclavicular or axillary adenopathy.     Lower Body: No right inguinal adenopathy. No left inguinal adenopathy.  Skin:    General: Skin is warm.     Coloration:  Skin is not jaundiced.     Findings: No lesion or rash.  Neurological:     General: No focal deficit present.     Mental Status: She is alert and oriented to person, place, and time. Mental status is at baseline.  Psychiatric:        Mood and Affect: Mood normal.        Behavior: Behavior normal.        Thought Content: Thought content normal.    SCANS:  Her chest CT done on 05-21-23 revealed the following: FINDINGS: Cardiovascular: Borderline cardiomegaly. No pericardial effusion. Mild coronary artery calcification. No central pulmonary embolus.  Aorta: Moderate/severe aortic plaque without aneurysm or dissection.  Mediastinum: There is no mediastinal, hilar, or axillary  lymphadenopathy.  Lungs: Mild pulmonary emphysema diffuse micro nodularity with tree-in-bud configuration indicating bronchiolitis. Superimposed patchy pneumonia in both lower lobes. Small left pleural effusion.  Pleura: Small left pleural effusion. No pneumothorax.  Chest wall: No masses identified.  Upper abdomen: A few scattered hepatic cysts measuring 6 mm or less.  Osseous: Bones are demineralized. No vertebral fractures. Extensive degenerative changes of the lower cervical and upper thoracic spine. Remote appearing right rib fractures. Right glenohumeral dislocation, chronic appearing with large right glenohumeral joint effusion and some capsular calcifications. Abnormal remodeled appearance of the right glenoid. No osseous metastatic disease is seen.  IMPRESSION: Diffuse bronchiolitis with patchy bibasilar pneumonia. No discrete mass is identified. A follow-up CT chest upon resolution of this acute process is recommended as any underlying pulmonary nodules related to patient's history of lung cancer could be obscured. 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  ASSESSMENT & PLAN:  A 78 y.o. 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  She also has a history of stage IIIA (T2 N1 M0) colon cancer, status post a right hemicolectomy in June 2022.  The patient also had a right lower lobe lung lesion and left lingula lesion that were hypermetabolic in nature which underwent stereotactic radiation in May 2024.  In clinic today, I went over her chest CT images with her, for which chronic lung changes could be seen.  I was particular concerned with the air-space disease in her right lung base that appears to be consistent with pneumonia.  Based upon this finding, I will place her on Levaquin  500 milligrams x 7 days.  However, there remains no obvious evidence of recurrent lung cancer  being present.  However, I will repeat another chest CT in 4 months for her continued radiographic lung cancer surveillance, and will review the images with her the following day in August 2025.  The patient understands all the plans discussed today and is in agreement with them.  Severa Jeremiah Felicia Horde, MD

## 2023-05-25 ENCOUNTER — Other Ambulatory Visit: Payer: Self-pay | Admitting: Oncology

## 2023-05-25 ENCOUNTER — Inpatient Hospital Stay: Payer: Medicare PPO | Attending: Oncology | Admitting: Oncology

## 2023-05-25 VITALS — BP 148/55 | HR 87 | Temp 98.7°F | Resp 16 | Ht 60.0 in

## 2023-05-25 DIAGNOSIS — Z85118 Personal history of other malignant neoplasm of bronchus and lung: Secondary | ICD-10-CM | POA: Diagnosis not present

## 2023-05-25 DIAGNOSIS — C3432 Malignant neoplasm of lower lobe, left bronchus or lung: Secondary | ICD-10-CM

## 2023-05-25 DIAGNOSIS — C182 Malignant neoplasm of ascending colon: Secondary | ICD-10-CM | POA: Diagnosis not present

## 2023-05-25 DIAGNOSIS — R918 Other nonspecific abnormal finding of lung field: Secondary | ICD-10-CM | POA: Diagnosis present

## 2023-05-25 DIAGNOSIS — Z923 Personal history of irradiation: Secondary | ICD-10-CM | POA: Diagnosis not present

## 2023-06-03 ENCOUNTER — Encounter: Admitting: Pulmonary Disease

## 2023-08-18 ENCOUNTER — Encounter: Admitting: Pulmonary Disease

## 2023-08-19 DIAGNOSIS — R0602 Shortness of breath: Secondary | ICD-10-CM | POA: Diagnosis not present

## 2023-08-27 DIAGNOSIS — I4891 Unspecified atrial fibrillation: Secondary | ICD-10-CM | POA: Diagnosis not present

## 2023-09-04 DEATH — deceased

## 2023-09-11 ENCOUNTER — Encounter: Payer: Self-pay | Admitting: Internal Medicine

## 2023-09-21 IMAGING — CT CT CHEST W/O CM
2 of 4 series · 15 of 36 positions shown, 18 images · non-contrast
Comparison: 09/04/2020 chest CT and PET-CT.

CLINICAL DATA: History of stage IA2 left lower lobe squamous cell
lung cancer and stage IIIA ascending colon cancer. Interval
radiation therapy to the left lower lobe pulmonary nodule completed
11/27/2020. Restaging.



[Series 2: thorax · axial · 0.57mm/px · z∈[-283,-41]mm · 12 of 144 slices shown, 15 images]
[im 12/144  mediastinal]
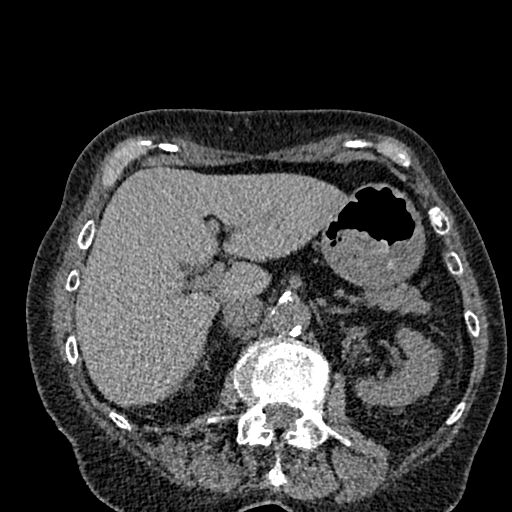
[im 12/144  lung]
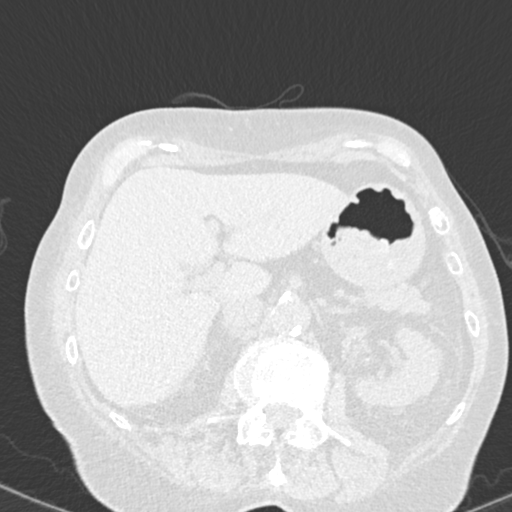
[im 23/144  lung]
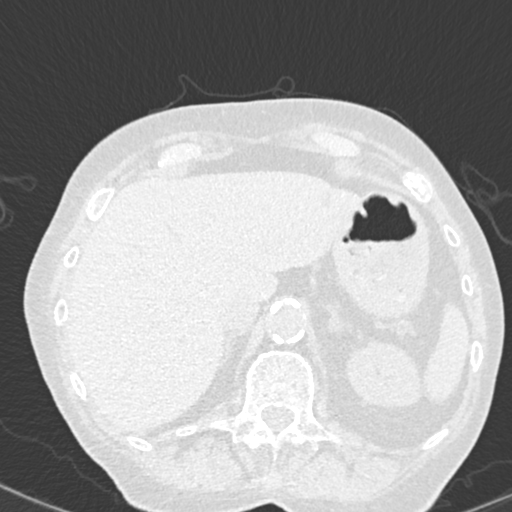
[im 34/144  lung]
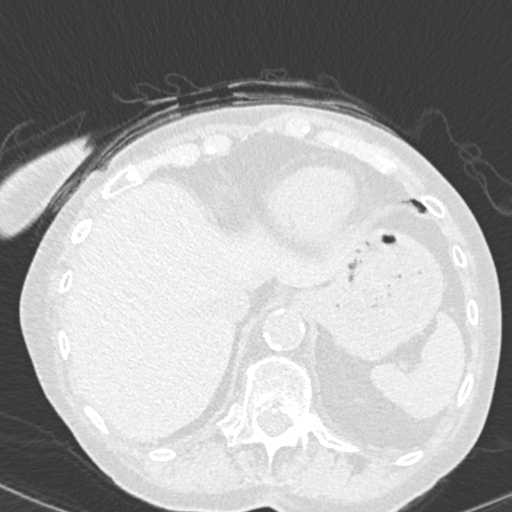
[im 45/144  lung]
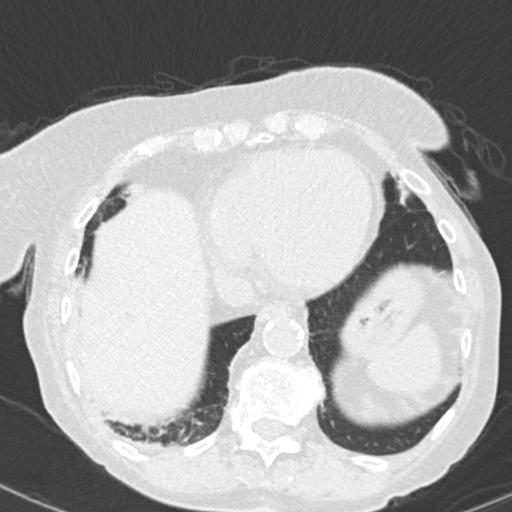
[im 56/144  mediastinal]
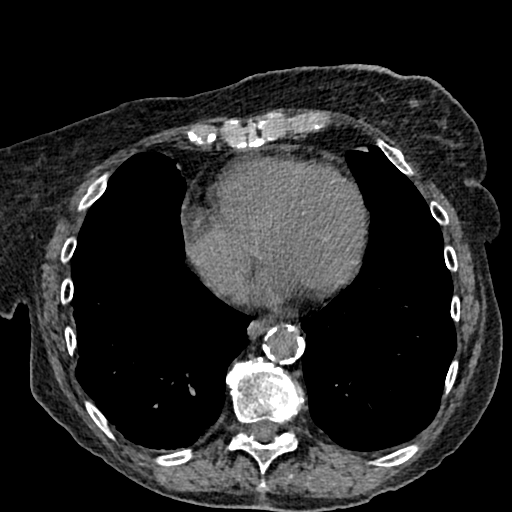
[im 56/144  lung]
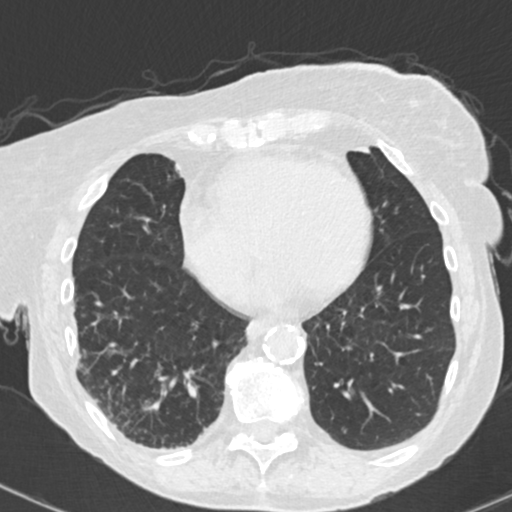
[im 67/144  lung]
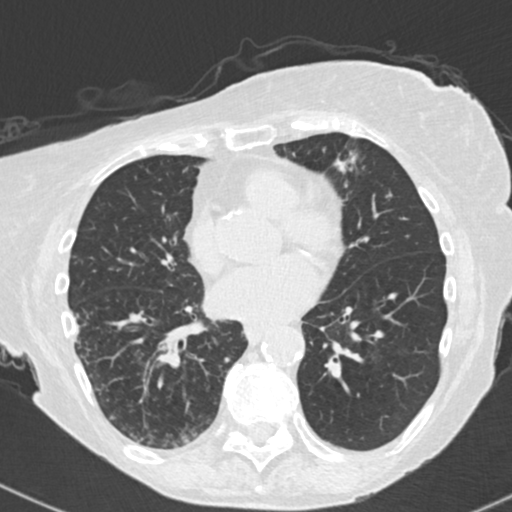
[im 78/144  lung]
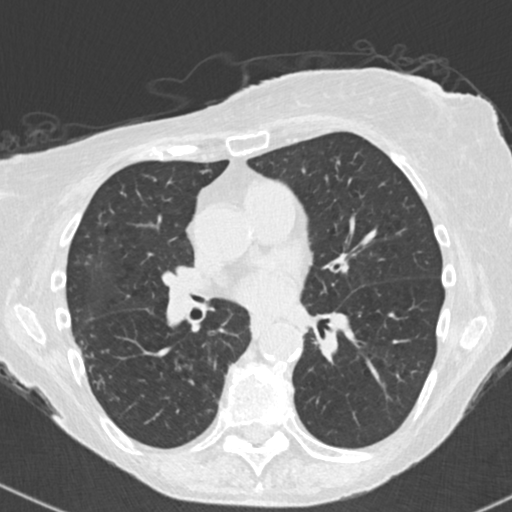
[im 89/144  lung]
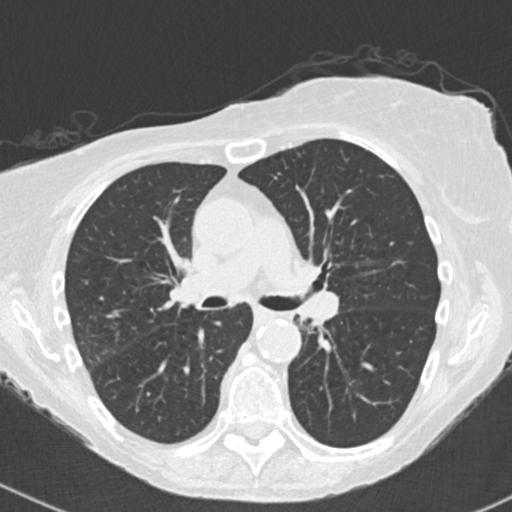
[im 100/144  mediastinal]
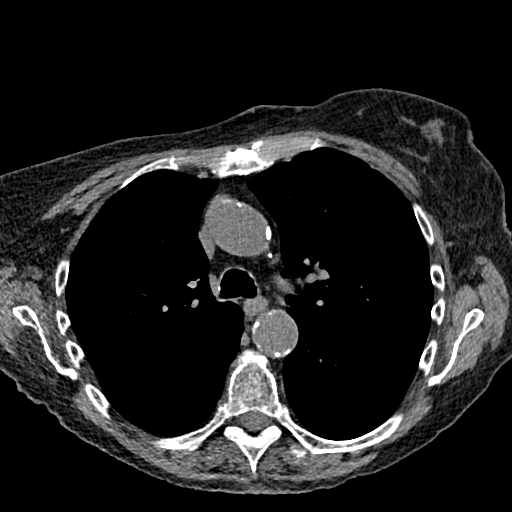
[im 100/144  lung]
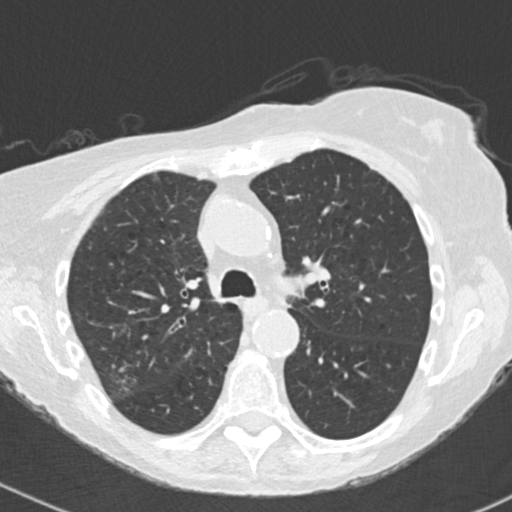
[im 111/144  lung]
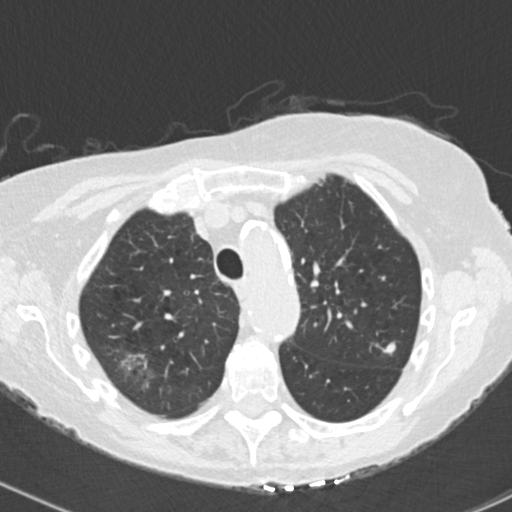
[im 122/144  lung]
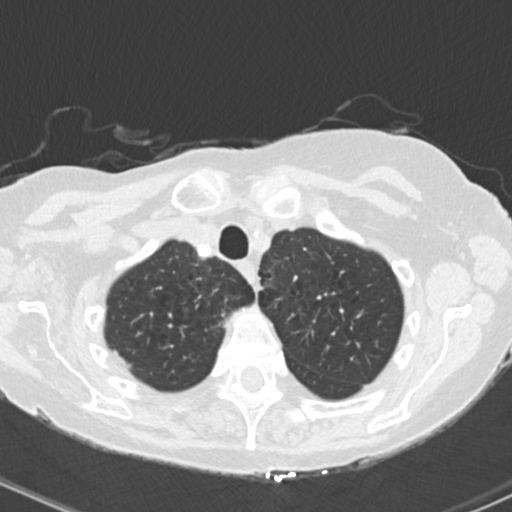
[im 133/144  lung]
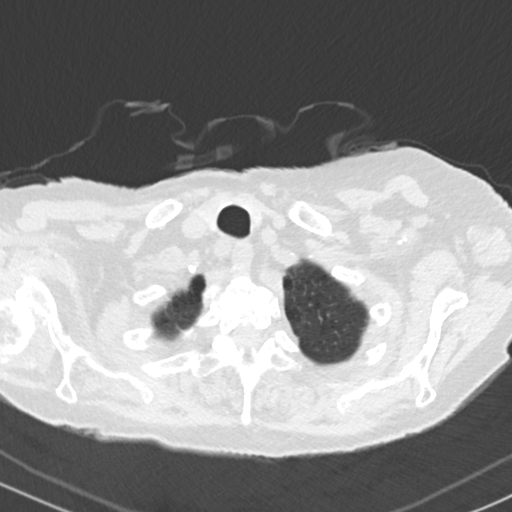

[Series 6: coronal · coronal · 0.56mm/px · 3 of 126 slices shown]
[im 26/126  lung]
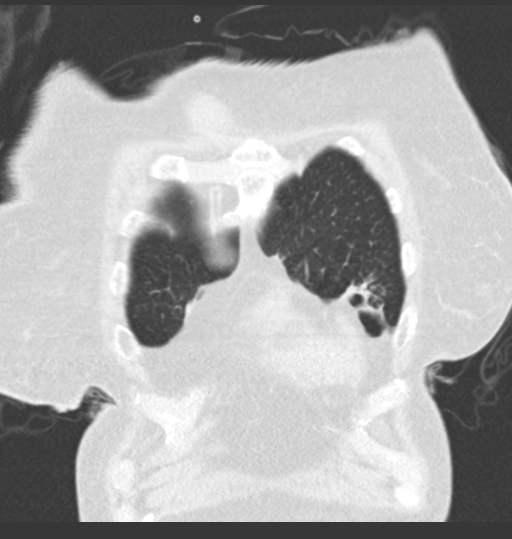
[im 51/126  lung]
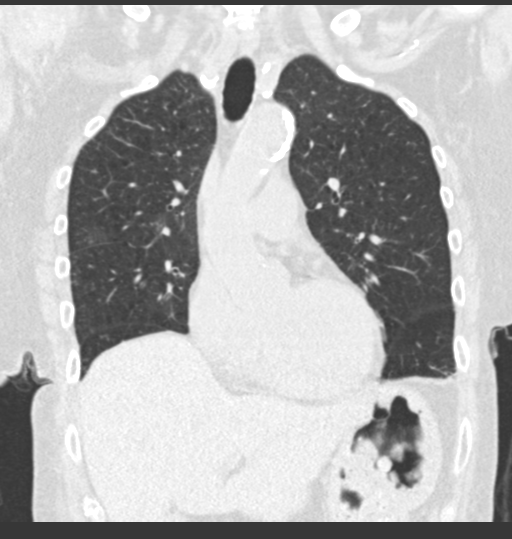
[im 76/126  lung]
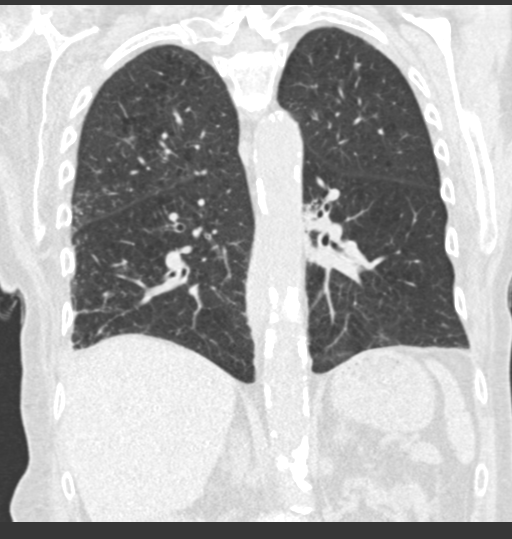

[15 of 36 positions shown; findings below may reference images not displayed]

FINDINGS: Cardiovascular: Normal heart size. Stable trace pericardial
effusion/thickening. Three-vessel coronary atherosclerosis.
Atherosclerotic nonaneurysmal thoracic aorta. Normal caliber
pulmonary arteries.

Mediastinum/Nodes: No discrete thyroid nodules. Unremarkable
esophagus. No pathologically enlarged axillary, mediastinal or hilar
lymph nodes, noting limited sensitivity for the detection of hilar
adenopathy on this noncontrast study.

Lungs/Pleura: No pneumothorax. No pleural effusion. Moderate
centrilobular emphysema with diffuse bronchial wall thickening.
Solid posterior left lower lobe 1.6 x 1.0 cm pulmonary nodule
(series 5/image 83), previously 1.8 x 1.0 cm, similar to minimally
decreased. Widespread new patchy tree-in-bud opacities throughout
the lungs bilaterally, most prominent in the right lower lobe.
Several new clustered solid pulmonary nodules scattered in both
lungs, for example 0.6 cm in the posterior left upper lobe (series
5/image 34) and 0.6 cm in the posterior right upper lobe (series
5/image 59).

Upper abdomen: A few scattered subcentimeter hypodense liver lesions
are too small to characterize and are not definitely changed. Stable
dystrophic coarse calcification in the posterior left liver.

Musculoskeletal: No aggressive appearing focal osseous lesions.
Moderate thoracic spondylosis.
IMPRESSION: 1. Solid posterior left lower lobe 1.6 cm pulmonary nodule is
similar to minimally decreased in size.
2. Widespread new patchy tree-in-bud opacities and scattered foci of
new clustered solid pulmonary nodularity scattered in both lungs,
with new dominant 0.6 cm solid nodules in the posterior upper lobes
bilaterally. Findings are indeterminate, potentially
infectious/inflammatory, with metastatic disease not excluded.
Short-term follow-up chest CT advised in 3 months.
3. No thoracic adenopathy.
4. Three-vessel coronary atherosclerosis.
5. Aortic Atherosclerosis (26MI1-VGU.U) and Emphysema ([id number].A).

## 2023-09-24 ENCOUNTER — Other Ambulatory Visit (HOSPITAL_BASED_OUTPATIENT_CLINIC_OR_DEPARTMENT_OTHER): Admitting: Radiology

## 2023-09-25 ENCOUNTER — Ambulatory Visit: Admitting: Oncology

## 2023-10-16 ENCOUNTER — Encounter: Admitting: Pulmonary Disease
